# Patient Record
Sex: Female | Born: 1945
Health system: Southern US, Community
[De-identification: ages and names within clinical notes are randomized; demographics above are authoritative.]

## PROBLEM LIST (undated history)

## (undated) DIAGNOSIS — M1990 Unspecified osteoarthritis, unspecified site: Secondary | ICD-10-CM

## (undated) DIAGNOSIS — I10 Essential (primary) hypertension: Secondary | ICD-10-CM

## (undated) DIAGNOSIS — M5430 Sciatica, unspecified side: Secondary | ICD-10-CM

## (undated) DIAGNOSIS — M109 Gout, unspecified: Secondary | ICD-10-CM

## (undated) DIAGNOSIS — K219 Gastro-esophageal reflux disease without esophagitis: Secondary | ICD-10-CM

## (undated) DIAGNOSIS — E785 Hyperlipidemia, unspecified: Secondary | ICD-10-CM

## (undated) DIAGNOSIS — N189 Chronic kidney disease, unspecified: Secondary | ICD-10-CM

## (undated) DIAGNOSIS — E559 Vitamin D deficiency, unspecified: Secondary | ICD-10-CM

## (undated) DIAGNOSIS — D649 Anemia, unspecified: Secondary | ICD-10-CM

## (undated) DIAGNOSIS — E119 Type 2 diabetes mellitus without complications: Secondary | ICD-10-CM

## (undated) HISTORY — DX: Vitamin D deficiency, unspecified: E55.9

## (undated) HISTORY — DX: Hyperlipidemia, unspecified: E78.5

## (undated) HISTORY — DX: Sciatica, unspecified side: M54.30

## (undated) HISTORY — DX: Unspecified osteoarthritis, unspecified site: M19.90

## (undated) HISTORY — PX: COLONOSCOPY: SHX174

## (undated) HISTORY — DX: Essential (primary) hypertension: I10

## (undated) HISTORY — PX: KNEE ARTHROSCOPY: SUR90

## (undated) HISTORY — DX: Gout, unspecified: M10.9

## (undated) HISTORY — DX: Type 2 diabetes mellitus without complications: E11.9

## (undated) HISTORY — DX: Chronic kidney disease, unspecified: N18.9

## (undated) HISTORY — DX: Anemia, unspecified: D64.9

---
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
## 1997-05-18 HISTORY — PX: CHOLECYSTECTOMY: SHX55

## 2012-05-18 DIAGNOSIS — E559 Vitamin D deficiency, unspecified: Secondary | ICD-10-CM

## 2012-05-18 HISTORY — DX: Vitamin D deficiency, unspecified: E55.9

## 2015-05-19 DIAGNOSIS — M109 Gout, unspecified: Secondary | ICD-10-CM

## 2015-05-19 HISTORY — PX: DILATION AND CURETTAGE OF UTERUS: SHX78

## 2015-05-19 HISTORY — DX: Gout, unspecified: M10.9

## 2015-05-19 HISTORY — PX: CATARACT EXTRACTION, BILATERAL: SHX1313

## 2016-06-15 ENCOUNTER — Ambulatory Visit (INDEPENDENT_AMBULATORY_CARE_PROVIDER_SITE_OTHER): Payer: Medicare HMO | Admitting: Family Medicine

## 2016-06-15 VITALS — BP 130/90 | HR 90 | Temp 98.2°F | Ht 62.32 in | Wt 270.4 lb

## 2016-06-15 DIAGNOSIS — I152 Hypertension secondary to endocrine disorders: Secondary | ICD-10-CM | POA: Insufficient documentation

## 2016-06-15 DIAGNOSIS — E785 Hyperlipidemia, unspecified: Secondary | ICD-10-CM | POA: Insufficient documentation

## 2016-06-15 DIAGNOSIS — E1140 Type 2 diabetes mellitus with diabetic neuropathy, unspecified: Secondary | ICD-10-CM

## 2016-06-15 DIAGNOSIS — M549 Dorsalgia, unspecified: Secondary | ICD-10-CM | POA: Insufficient documentation

## 2016-06-15 DIAGNOSIS — M1990 Unspecified osteoarthritis, unspecified site: Secondary | ICD-10-CM | POA: Insufficient documentation

## 2016-06-15 DIAGNOSIS — M5441 Lumbago with sciatica, right side: Secondary | ICD-10-CM

## 2016-06-15 DIAGNOSIS — M109 Gout, unspecified: Secondary | ICD-10-CM | POA: Insufficient documentation

## 2016-06-15 DIAGNOSIS — E559 Vitamin D deficiency, unspecified: Secondary | ICD-10-CM | POA: Insufficient documentation

## 2016-06-15 DIAGNOSIS — I10 Essential (primary) hypertension: Secondary | ICD-10-CM

## 2016-06-15 DIAGNOSIS — D649 Anemia, unspecified: Secondary | ICD-10-CM | POA: Insufficient documentation

## 2016-06-15 DIAGNOSIS — Z794 Long term (current) use of insulin: Secondary | ICD-10-CM

## 2016-06-15 DIAGNOSIS — G8929 Other chronic pain: Secondary | ICD-10-CM

## 2016-06-15 DIAGNOSIS — M545 Low back pain: Secondary | ICD-10-CM | POA: Insufficient documentation

## 2016-06-15 LAB — POCT GLYCOSYLATED HEMOGLOBIN (HGB A1C): Hemoglobin A1C: 8.8

## 2016-06-15 MED ORDER — GABAPENTIN 300 MG PO CAPS: 300.0000 mg | ORAL_CAPSULE | Freq: Two times a day (BID) | ORAL | 3 refills | Status: DC

## 2016-06-15 NOTE — Progress Notes (Signed)
    Subjective:    Patient ID: Michelle Abbott, female    DOB: 1945/12/04, 71 y.o.   MRN: [id number]   CC: here to establish care.  PMH- HTN, DM, gout, iron def anemia, vit D def, hyperlipidemia, diastolic CHF, OA, neuropathy in hands, low back pain Meds- Allergies- ciprofloxacin (IV burned) Surg Hx- gallbladder removal, cataract surgery, lumpectomy R breast, c-section x2 FH- ovarian/pancreatic cancer in mother, lung cancer in brother, HTN/DM in family [place]- lives alone, moved to [place] in November to be closer to daughter. No alcohol or drug use. Former smoker- 1.5 ppd for 30 years.  DM- last A1C was 9.5 in October/November. She has self decreased her Lantus dose from 110 units/night to 100/night because she was feeling shaky and her blood sguar was 70 one morning. She has been eating healthier since she moved to [place]. She has cut down on breads, pasta, potatoes. She does not eat 3 meals a day. Sometimes 1-2 meals a day. She will eat cereals, chicken, green vegetables, fruits, occasionally hamburgers or hotdogs. She will check her blood sugar in the morning maybe twice a week. Has been 63's. Today was 117. She will only take Glipizide once a day usually.   Review of Systems- see HPI   Objective:  BP 130/90 (BP Location: Left Wrist, Patient Position: Sitting, Cuff Size: Normal)   Pulse 90   Temp 98.2 F (36.8 C) (Oral)   Ht 5' 2.32" (1.583 m)   Wt 270 lb 6.4 oz (122.7 kg)   SpO2 94%   BMI 48.95 kg/m  Vitals and nursing note reviewed  General: pleasant elderly lady, obese, in no acute distress Neck: supple, non-tender, without lymphadenopathy or thryomegaly Cardiac: RRR, clear S1 and S2, no murmurs, rubs, or gallops Respiratory: clear to auscultation bilaterally, no increased work of breathing Abdomen: obese abdomen. soft, nontender, +BS Extremities: no edema or cyanosis. +2 radial pulses bilaterally Back: tender to palpation over right SI joint Skin: warm and dry, no  rashes noted Neuro: alert and oriented, no focal deficits. Strength 5/5 in upper and lower extremities bilaterally   Assessment & Plan:    Type 2 diabetes mellitus with diabetic neuropathy, with long-term current use of insulin (HCC) Patient's history concerning for hypogylcemia especially with large dose of Lantus at night and not eating much throughout day  Continue 100u lantus every night, can decrease by 10 units until fasting sugars are above 100 every morning Instructed to check CBG every morning for next several weeks continue Glipizide once a day as she has been Check A1C today Follow up in 1 month, can further change medications at that time    Essential hypertension BP at goal today  Continue current regimen Recheck 1 month  Low back pain  Has been chronic. Patient told she has osteoarthritis of hip but was given neurontin by last provider that helps a lot with pain at night.  -increase gabapentin to 300 mg BID, can increase further at next visit -patient advised to stretch hip muscles -follow up 1 month    Return in about 4 weeks (around 07/13/2016).   Lucila Maine, DO Family Medicine Resident PGY-1

## 2016-06-15 NOTE — Patient Instructions (Signed)
  Please check your blood sugars every morning. If your CBG is <80 consistently, decrease your Lantus by 10 units like you did before.   Let's increase your Gabapentin to 300mg  twice a day.   We'll check your A1C today.  I'll see you back in 1 month.

## 2016-06-15 NOTE — Assessment & Plan Note (Signed)
BP at goal today  Continue current regimen Recheck 1 month

## 2016-06-15 NOTE — Assessment & Plan Note (Signed)
Patient's history concerning for hypogylcemia especially with large dose of Lantus at night and not eating much throughout day  Continue 100u lantus every night, can decrease by 10 units until fasting sugars are above 100 every morning Instructed to check CBG every morning for next several weeks continue Glipizide once a day as she has been Check A1C today Follow up in 1 month, can further change medications at that time

## 2016-06-15 NOTE — Assessment & Plan Note (Signed)
  Has been chronic. Patient told she has osteoarthritis of hip but was given neurontin by last provider that helps a lot with pain at night.  -increase gabapentin to 300 mg BID, can increase further at next visit -patient advised to stretch hip muscles -follow up 1 month

## 2016-07-15 ENCOUNTER — Ambulatory Visit: Payer: Medicare HMO | Admitting: Obstetrics and Gynecology

## 2016-07-21 ENCOUNTER — Ambulatory Visit (INDEPENDENT_AMBULATORY_CARE_PROVIDER_SITE_OTHER): Payer: Medicare HMO | Admitting: Obstetrics and Gynecology

## 2016-07-21 ENCOUNTER — Encounter: Payer: Self-pay | Admitting: Obstetrics and Gynecology

## 2016-07-21 VITALS — BP 142/76 | HR 71 | Temp 97.3°F | Wt 273.0 lb

## 2016-07-21 DIAGNOSIS — E1140 Type 2 diabetes mellitus with diabetic neuropathy, unspecified: Secondary | ICD-10-CM

## 2016-07-21 DIAGNOSIS — Z794 Long term (current) use of insulin: Secondary | ICD-10-CM

## 2016-07-21 DIAGNOSIS — M545 Low back pain, unspecified: Secondary | ICD-10-CM

## 2016-07-21 DIAGNOSIS — G8929 Other chronic pain: Secondary | ICD-10-CM

## 2016-07-21 DIAGNOSIS — I10 Essential (primary) hypertension: Secondary | ICD-10-CM | POA: Diagnosis not present

## 2016-07-21 DIAGNOSIS — N183 Chronic kidney disease, stage 3 (moderate): Secondary | ICD-10-CM

## 2016-07-21 DIAGNOSIS — N185 Chronic kidney disease, stage 5: Secondary | ICD-10-CM | POA: Insufficient documentation

## 2016-07-21 DIAGNOSIS — N184 Chronic kidney disease, stage 4 (severe): Secondary | ICD-10-CM | POA: Insufficient documentation

## 2016-07-21 LAB — COMPLETE METABOLIC PANEL WITH GFR
ALBUMIN: 3.5 g/dL — AB (ref 3.6–5.1)
ALK PHOS: 80 U/L (ref 33–130)
ALT: 8 U/L (ref 6–29)
AST: 12 U/L (ref 10–35)
BILIRUBIN TOTAL: 0.3 mg/dL (ref 0.2–1.2)
BUN: 24 mg/dL (ref 7–25)
CO2: 30 mmol/L (ref 20–31)
CREATININE: 1.62 mg/dL — AB (ref 0.50–0.99)
Calcium: 9.4 mg/dL (ref 8.6–10.4)
Chloride: 106 mmol/L (ref 98–110)
GFR, EST AFRICAN AMERICAN: 37 mL/min — AB (ref >=60)
GFR, Est Non African American: 32 mL/min — ABNORMAL LOW (ref >=60)
Glucose, Bld: 97 mg/dL (ref 65–99)
Potassium: 4 mmol/L (ref 3.5–5.3)
Sodium: 142 mmol/L (ref 135–146)
TOTAL PROTEIN: 6.4 g/dL (ref 6.1–8.1)

## 2016-07-21 MED ORDER — BACLOFEN 10 MG PO TABS: 10.0000 mg | ORAL_TABLET | Freq: Three times a day (TID) | ORAL | 0 refills | Status: DC

## 2016-07-21 MED ORDER — DICLOFENAC SODIUM 50 MG PO TBEC: 50.0000 mg | DELAYED_RELEASE_TABLET | Freq: Two times a day (BID) | ORAL | 0 refills | Status: AC

## 2016-07-21 NOTE — Assessment & Plan Note (Signed)
Has known chronic back pain. Continues to endorse pain. Takes gabapentin which helps some. Was unable to increase to BID because of side effects. Rx given for short course of diclofenac (7 days) and baclofen to help with back pain. Back exercises also given. In furture may need to consider back imaging and PT. Follow-up with PCP.

## 2016-07-21 NOTE — Progress Notes (Signed)
     Subjective: Chief Complaint  Patient presents with  . Diabetes  . Back Pain     HPI: Michelle Abbott is a 71 y.o. presenting to clinic today to discuss the following:  #Diabetes:  Last A1c 8.8 05/2016 Her for follow-up on DM per PCP Sugars - High at home: 143    Low at home: 46 Taking medications: Lantus 100U and glipizide sporadically  Side effects: No reported hypoglycemia since last month On Aspirin On statin ROS: denies fever, chills, dizziness, LOC, polyuria, polydipsia, numbness or tingling in extremities or chest pain, visual disturbances.  #Back Pain Has known sciatica and DJD of spine  Achy pain that is sharp and stabbing on right side Denies radiation symptoms   Has tried aleve without relief Unable to walk long distances due to pain  #HTN Compliant with medications  ROS noted in HPI.   Past Medical, Surgical, Social, and Family History Reviewed & Updated per EMR.   Pertinent Historical Findings include:   History  Smoking Status  . Former Smoker  Smokeless Tobacco  . Never Used    Objective: BP (!) 152/76   Pulse 71   Temp 97.3 F (36.3 C) (Oral)   Wt 273 lb (123.8 kg)   SpO2 99%   BMI 49.42 kg/m  Vitals and nursing notes reviewed  Physical Exam  Constitutional: She is oriented to person, place, and time and well-developed, well-nourished, and in no distress.  Obese   Cardiovascular: Normal rate.   Pulmonary/Chest: Effort normal.  Musculoskeletal: Normal range of motion.  Neurological: She is alert and oriented to person, place, and time. No cranial nerve deficit. Gait normal.   Back Exam:  Inspection: Unremarkable  ROM normal but does fill pulling SLR laying: Negative  XSLR laying: Negative  Palpable tenderness: Yes to piriformis muscle Sensory change: Gross sensation intact to all lumbar and sacral dermatomes.  Strength normal  Gait unremarkable.   No results found for this or any previous visit (from the past 72  hour(s)).  Assessment/Plan: Please see problem based Assessment and Plan PATIENT EDUCATION PROVIDED: See AVS    Diagnosis and plan along with any newly prescribed medication(s) were discussed in detail with this patient today. The patient verbalized understanding and agreed with the plan. Patient advised if symptoms worsen return to clinic.    Orders Placed This Encounter  Procedures  . COMPLETE METABOLIC PANEL WITH GFR    Meds ordered this encounter  Medications  . diclofenac (VOLTAREN) 50 MG EC tablet    Sig: Take 1 tablet (50 mg total) by mouth 2 (two) times daily.    Dispense:  14 tablet    Refill:  0  . baclofen (LIORESAL) 10 MG tablet    Sig: Take 1 tablet (10 mg total) by mouth 3 (three) times daily.    Dispense:  30 each    Refill:  0  . glipiZIDE (GLUCOTROL) 10 MG tablet    Sig: Take 10 mg by mouth 2 (two) times daily before a meal.  . rosuvastatin (CRESTOR) 40 MG tablet    Sig: Take 40 mg by mouth daily.     Luiz Blare, DO 07/21/2016, 11:09 AM PGY-3, [facility]

## 2016-07-21 NOTE — Assessment & Plan Note (Signed)
CBGs better controlled. No more reported episodes of hypoglycemia. Patient to continue current regimen. Encouraged to take all medications as prescribed. Unable to take metformin due to renal disease per patient. Will obtain labs to verify since she is new patient with no previous labs in our system. Follow-up in 2 months with PCP for A1c recheck.

## 2016-07-21 NOTE — Patient Instructions (Addendum)
Continue Lantus 100U and glipizide. No adjustments today For back pain use muscle relaxant. Can also use antiinflammatory medicine for 7 days Checking kidney function   Follow-up with PCP in 2 months   Back Exercises If you have pain in your back, do these exercises 2-3 times each day or as told by your doctor. When the pain goes away, do the exercises once each day, but repeat the steps more times for each exercise (do more repetitions). If you do not have pain in your back, do these exercises once each day or as told by your doctor. Exercises Single Knee to Chest   Do these steps 3-5 times in a row for each leg: 1. Lie on your back on a firm bed or the floor with your legs stretched out. 2. Bring one knee to your chest. 3. Hold your knee to your chest by grabbing your knee or thigh. 4. Pull on your knee until you feel a gentle stretch in your lower back. 5. Keep doing the stretch for 10-30 seconds. 6. Slowly let go of your leg and straighten it. Pelvic Tilt   Do these steps 5-10 times in a row: 1. Lie on your back on a firm bed or the floor with your legs stretched out. 2. Bend your knees so they point up to the ceiling. Your feet should be flat on the floor. 3. Tighten your lower belly (abdomen) muscles to press your lower back against the floor. This will make your tailbone point up to the ceiling instead of pointing down to your feet or the floor. 4. Stay in this position for 5-10 seconds while you gently tighten your muscles and breathe evenly. Cat-Cow   Do these steps until your lower back bends more easily: 1. Get on your hands and knees on a firm surface. Keep your hands under your shoulders, and keep your knees under your hips. You may put padding under your knees. 2. Let your head hang down, and make your tailbone point down to the floor so your lower back is round like the back of a cat. 3. Stay in this position for 5 seconds. 4. Slowly lift your head and make your  tailbone point up to the ceiling so your back hangs low (sags) like the back of a cow. 5. Stay in this position for 5 seconds. Press-Ups   Do these steps 5-10 times in a row: 1. Lie on your belly (face-down) on the floor. 2. Place your hands near your head, about shoulder-width apart. 3. While you keep your back relaxed and keep your hips on the floor, slowly straighten your arms to raise the top half of your body and lift your shoulders. Do not use your back muscles. To make yourself more comfortable, you may change where you place your hands. 4. Stay in this position for 5 seconds. 5. Slowly return to lying flat on the floor. Bridges   Do these steps 10 times in a row: 1. Lie on your back on a firm surface. 2. Bend your knees so they point up to the ceiling. Your feet should be flat on the floor. 3. Tighten your butt muscles and lift your butt off of the floor until your waist is almost as high as your knees. If you do not feel the muscles working in your butt and the back of your thighs, slide your feet 1-2 inches farther away from your butt. 4. Stay in this position for 3-5 seconds. 5. Slowly lower your  butt to the floor, and let your butt muscles relax. If this exercise is too easy, try doing it with your arms crossed over your chest. Belly Crunches   Do these steps 5-10 times in a row: 1. Lie on your back on a firm bed or the floor with your legs stretched out. 2. Bend your knees so they point up to the ceiling. Your feet should be flat on the floor. 3. Cross your arms over your chest. 4. Tip your chin a little bit toward your chest but do not bend your neck. 5. Tighten your belly muscles and slowly raise your chest just enough to lift your shoulder blades a tiny bit off of the floor. 6. Slowly lower your chest and your head to the floor. Back Lifts  Do these steps 5-10 times in a row: 1. Lie on your belly (face-down) with your arms at your sides, and rest your forehead on the  floor. 2. Tighten the muscles in your legs and your butt. 3. Slowly lift your chest off of the floor while you keep your hips on the floor. Keep the back of your head in line with the curve in your back. Look at the floor while you do this. 4. Stay in this position for 3-5 seconds. 5. Slowly lower your chest and your face to the floor. Contact a doctor if:  Your back pain gets a lot worse when you do an exercise.  Your back pain does not lessen 2 hours after you exercise. If you have any of these problems, stop doing the exercises. Do not do them again unless your doctor says it is okay. Get help right away if:  You have sudden, very bad back pain. If this happens, stop doing the exercises. Do not do them again unless your doctor says it is okay. This information is not intended to replace advice given to you by your health care provider. Make sure you discuss any questions you have with your health care provider. Document Released: 06/06/2010 Document Revised: 10/10/2015 Document Reviewed: 06/28/2014 Elsevier Interactive Patient Education  2017 [employer].

## 2016-07-21 NOTE — Assessment & Plan Note (Signed)
BP initially elevated but on recheck wnl. Continue current regimen. Will obtain blood work today to evaluate kidney function as patient states she has CKD.

## 2016-08-25 ENCOUNTER — Ambulatory Visit (INDEPENDENT_AMBULATORY_CARE_PROVIDER_SITE_OTHER): Payer: Medicare HMO | Admitting: *Deleted

## 2016-08-25 ENCOUNTER — Encounter: Payer: Self-pay | Admitting: *Deleted

## 2016-08-25 VITALS — BP 136/62 | HR 62 | Temp 97.6°F | Ht 63.0 in | Wt 269.8 lb

## 2016-08-25 DIAGNOSIS — Z0000 Encounter for general adult medical examination without abnormal findings: Secondary | ICD-10-CM

## 2016-08-25 NOTE — Progress Notes (Signed)
Subjective:   Michelle Abbott is a 70 y.o. female who presents for an Initial Medicare Annual Wellness Visit.  Cardiac Risk Factors include: advanced age (>11men, >61 women);diabetes mellitus;dyslipidemia;hypertension;obesity (BMI >30kg/m2);sedentary lifestyle;smoking/ tobacco exposure     Objective:    Today's Vitals   08/25/16 1013 08/25/16 1014  BP:  136/62  Pulse:  62  Temp:  97.6 F (36.4 C)  TempSrc:  Oral  SpO2:  99%  Weight: 269 lb 12.8 oz (122.4 kg)   Height: 5\' 3"  (1.6 m)   PainSc:  8    Body mass index is 47.79 kg/m.   Current Medications (verified) Outpatient Encounter Prescriptions as of 08/25/2016  Medication Sig  . allopurinol (ZYLOPRIM) 300 MG tablet Take 300 mg by mouth daily.  . ASPIR-81 81 MG EC tablet Take 81 mg by mouth daily.  . B-D ULTRAFINE III SHORT PEN 31G X 8 MM MISC 270 Units by Does not apply route every morning.  . chlorthalidone (HYGROTON) 25 MG tablet Take 25 mg by mouth daily.  . ferrous sulfate 325 (65 FE) MG tablet Take 325 mg by mouth daily.  Marland Kitchen gabapentin (NEURONTIN) 300 MG capsule Take 1 capsule (300 mg total) by mouth 2 (two) times daily.  Marland Kitchen glipiZIDE (GLUCOTROL) 10 MG tablet Take 10 mg by mouth 2 (two) times daily before a meal.  . LANTUS SOLOSTAR 100 UNIT/ML Solostar Pen Inject 100 Units into the skin at bedtime.  Marland Kitchen lisinopril (PRINIVIL,ZESTRIL) 40 MG tablet Take 40 mg by mouth daily.  . metoprolol succinate (TOPROL-XL) 25 MG 24 hr tablet Take 25 mg by mouth daily.  . rosuvastatin (CRESTOR) 40 MG tablet Take 40 mg by mouth daily.  . Vitamins A & D (VITAMIN A & D) 10000-400 units TABS Take 1,000 Units by mouth daily.  . baclofen (LIORESAL) 10 MG tablet Take 1 tablet (10 mg total) by mouth 3 (three) times daily. (Patient not taking: Reported on 08/25/2016)   No facility-administered encounter medications on file as of 08/25/2016.     Allergies (verified) Ciprofloxacin hcl   History: Past Medical History:  Diagnosis Date  .  Anemia   . Arthritis   . Chronic kidney disease   . Diabetes mellitus without complication ([place])   . Gout 2017  . Hyperlipidemia   . Hypertension   . Vitamin D deficiency 2014   Past Surgical History:  Procedure Laterality Date  . BREAST SURGERY Right 1961   Benign lump removed  . CATARACT EXTRACTION, BILATERAL Bilateral 2017  . CESAREAN SECTION  1985  . CESAREAN SECTION  1989  . CHOLECYSTECTOMY  1999  . DILATION AND CURETTAGE OF UTERUS  2017  . KNEE ARTHROSCOPY Right   . VAGINAL SEPTOPLASTY  1966   Family History  Problem Relation Age of Onset  . Cancer Mother     ovarian and pancreatic  . Diabetes Mother   . Stroke Mother   . COPD Father   . Arthritis Father   . Cancer Brother     Lung  . COPD Brother    Social History   Occupational History  . Not on file.   Social History Main Topics  . Smoking status: Former Smoker    Packs/day: 1.50    Years: 30.00    Types: Cigarettes    Quit date: 05/18/1997  . Smokeless tobacco: Never Used  . Alcohol use No  . Drug use: No  . Sexual activity: No    Tobacco Counseling Counseling given: Yes Patient  is former smoker with no plans to restart   Activities of Daily Living In your present state of health, do you have any difficulty performing the following activities: 08/25/2016  Hearing? N  Vision? Y  Difficulty concentrating or making decisions? N  Walking or climbing stairs? Y  Dressing or bathing? N  Doing errands, shopping? N  Preparing Food and eating ? N  Using the Toilet? N  In the past six months, have you accidently leaked urine? Y  Do you have problems with loss of bowel control? N  Managing your Medications? N  Managing your Finances? N  Housekeeping or managing your Housekeeping? N   Home Safety:  My home has a working smoke alarm:  Yes X 2           My home throw rugs have been fastened down to the floor or removed:  Removed I have non-slip mats in the bathtub and shower:  Yes         All my  home's stairs have railings or bannisters: One level home with 2 outside steps without handrails           My home's floors, stairs and hallways are free from clutter, wires and cords:  Yes     I wear seatbelts consistently:  Yes   Immunizations and Health Maintenance Immunization History  Administered Date(s) Administered  . Influenza-Unspecified 02/21/2016   Health Maintenance Due  Topic Date Due  . Hepatitis C Screening  05-Aug-1945  . FOOT EXAM  01/27/1958  . OPHTHALMOLOGY EXAM  01/27/1958  . TETANUS/TDAP  01/28/1967  . MAMMOGRAM  01/27/1998  . COLONOSCOPY  01/27/1998  . DEXA SCAN  01/27/2013  . PNA vac Low Risk Adult (1 of 2 - PCV13) 01/27/2013  Patient recently moved from [place] and brought medical records (mostly Clinical Summaries) with her. Records give dates of flu and prevnar vaccines and state she previously received pneumovax and Hep B but no dates given.  State last pap was 06/2014 Only blood work is a Chem 8 from 01/17/2014 No Lipid panel or Hep C found.  Will need patient to fill out ROI and send to:  [facility] [address] [place] [address]  Ph [phone] Fax [phone] Patient states last colonoscopy was > 10 years ago contact info given for GI Patient states last eye exam 02/2016  Patient Care Team: Steve Rattler, DO as PCP - General  Indicate any recent Medical Services you may have received from other than [facility] providers in the past year (date may be approximate).     Assessment:   This is a routine wellness examination for [name].   Hearing/Vision screen  Hearing Screening   Method: Audiometry   125Hz  250Hz  500Hz  1000Hz  2000Hz  3000Hz  4000Hz  6000Hz  8000Hz   Right ear:   40 Fail Fail  40    Left ear:   40 40 40  Fail      Dietary issues and exercise activities discussed: Current Exercise Habits: Home exercise routine, Type of exercise: stretching;walking, Time (Minutes): 10, Frequency (Times/Week): 7, Weekly  Exercise (Minutes/Week): 70, Intensity: Mild  Goals    . Blood Pressure < 140/90    . Eat more fruits and vegetables (pt-stated)    . Weight (lb) < 255 lb (115.7 kg) (pt-stated)          5% weight loss      Depression Screen PHQ 2/9 Scores 08/25/2016 07/21/2016 06/15/2016  PHQ - 2 Score 1  0 0    Fall Risk Fall Risk  08/25/2016 07/21/2016 06/15/2016 06/15/2016  Falls in the past year? No No - No  Risk for fall due to : - - (No Data) History of fall(s)  Risk for fall due to (comments): - - entered in error/ no history of falls -   TUG Test:  Done in 10 seconds. Patient used both hands to push out of chair and to sit back down.  Cognitive Function: Mini-Cog  Passed with score 5/5   Screening Tests Health Maintenance  Topic Date Due  . Hepatitis C Screening  08/27/1945  . FOOT EXAM  01/27/1958  . OPHTHALMOLOGY EXAM  01/27/1958  . TETANUS/TDAP  01/28/1967  . MAMMOGRAM  01/27/1998  . COLONOSCOPY  01/27/1998  . DEXA SCAN  01/27/2013  . PNA vac Low Risk Adult (1 of 2 - PCV13) 01/27/2013  . HEMOGLOBIN A1C  12/13/2016  . INFLUENZA VACCINE  12/16/2016  Foot exam done today Diabetic Foot Exam - Simple   Simple Foot Form Diabetic Foot exam was performed with the following findings:  Yes 08/25/2016 10:45 AM  Visual Inspection No deformities, no ulcerations, no other skin breakdown bilaterally:  Yes Sensation Testing Intact to touch and monofilament testing bilaterally:  Yes Pulse Check Posterior Tibialis and Dorsalis pulse intact bilaterally:  Yes Comments All 10 toes have some degree of discoloration (black). Patient states that she has previously been treated for toenail fungus and thinks that is what this is. Encouraged to f/u with PCP for possible treatment. Hubbard Hartshorn, RN, BSN         Plan:   F/u with PCP for toe discoloration and patient c/o hand tremor R > L   Card given for Dr. Jenne Campus to schedule nutrition visit for help with desired 5% weight loss  Will need patient to  fill out ROI and send to:  [facility] [address] [place] [address]  Ph [phone] Fax [phone]   During the course of the visit, Tate was educated and counseled about the following appropriate screening and preventive services:   Vaccines to include Pneumoccal, Influenza, Td, Zostavax/Shingrix  Cardiovascular disease screening  Colorectal cancer screening  Bone density screening  Diabetes screening  Glaucoma screening  Mammography/PAP  Nutrition counseling  Patient Instructions (the written plan) were given to the patient.    Velora Heckler, RN   08/25/2016

## 2016-08-25 NOTE — Patient Instructions (Addendum)
 Diabetes and Foot Care Diabetes may cause you to have problems because of poor blood supply (circulation) to your feet and legs. This may cause the skin on your feet to become thinner, break easier, and heal more slowly. Your skin may become dry, and the skin may peel and crack. You may also have nerve damage in your legs and feet causing decreased feeling in them. You may not notice minor injuries to your feet that could lead to infections or more serious problems. Taking care of your feet is one of the most important things you can do for yourself. Follow these instructions at home:  Wear shoes at all times, even in the house. Do not go barefoot. Bare feet are easily injured.  Check your feet daily for blisters, cuts, and redness. If you cannot see the bottom of your feet, use a mirror or ask someone for help.  Wash your feet with warm water (do not use hot water) and mild soap. Then pat your feet and the areas between your toes until they are completely dry. Do not soak your feet as this can dry your skin.  Apply a moisturizing lotion or petroleum jelly (that does not contain alcohol and is unscented) to the skin on your feet and to dry, brittle toenails. Do not apply lotion between your toes.  Trim your toenails straight across. Do not dig under them or around the cuticle. File the edges of your nails with an emery board or nail file.  Do not cut corns or calluses or try to remove them with medicine.  Wear clean socks or stockings every day. Make sure they are not too tight. Do not wear knee-high stockings since they may decrease blood flow to your legs.  Wear shoes that fit properly and have enough cushioning. To break in new shoes, wear them for just a few hours a day. This prevents you from injuring your feet. Always look in your shoes before you put them on to be sure there are no objects inside.  Do not cross your legs. This may decrease the blood flow to your feet.  If you find a  minor scrape, cut, or break in the skin on your feet, keep it and the skin around it clean and dry. These areas may be cleansed with mild soap and water. Do not cleanse the area with peroxide, alcohol, or iodine.  When you remove an adhesive bandage, be sure not to damage the skin around it.  If you have a wound, look at it several times a day to make sure it is healing.  Do not use heating pads or hot water bottles. They may burn your skin. If you have lost feeling in your feet or legs, you may not know it is happening until it is too late.  Make sure your health care provider performs a complete foot exam at least annually or more often if you have foot problems. Report any cuts, sores, or bruises to your health care provider immediately. Contact a health care provider if:  You have an injury that is not healing.  You have cuts or breaks in the skin.  You have an ingrown nail.  You notice redness on your legs or feet.  You feel burning or tingling in your legs or feet.  You have pain or cramps in your legs and feet.  Your legs or feet are numb.  Your feet always feel cold. Get help right away if:  There is   increasing redness, swelling, or pain in or around a wound.  There is a red line that goes up your leg.  Pus is coming from a wound.  You develop a fever or as directed by your health care provider.  You notice a bad smell coming from an ulcer or wound. This information is not intended to replace advice given to you by your health care provider. Make sure you discuss any questions you have with your health care provider. Document Released: 05/01/2000 Document Revised: 10/10/2015 Document Reviewed: 10/11/2012 Elsevier Interactive Patient Education  2017 [place] Prevention in the Home Falls can cause injuries. They can happen to people of all ages. There are many things you can do to make your home safe and to help prevent falls. What can I do on the  outside of my home?  Regularly fix the edges of walkways and driveways and fix any cracks.  Remove anything that might make you trip as you walk through a door, such as a raised step or threshold.  Trim any bushes or trees on the path to your home.  Use bright outdoor lighting.  Clear any walking paths of anything that might make someone trip, such as rocks or tools.  Regularly check to see if handrails are loose or broken. Make sure that both sides of any steps have handrails.  Any raised decks and porches should have guardrails on the edges.  Have any leaves, snow, or ice cleared regularly.  Use sand or salt on walking paths during winter.  Clean up any spills in your garage right away. This includes oil or grease spills. What can I do in the bathroom?  Use night lights.  Install grab bars by the toilet and in the tub and shower. Do not use towel bars as grab bars.  Use non-skid mats or decals in the tub or shower.  If you need to sit down in the shower, use a plastic, non-slip stool.  Keep the floor dry. Clean up any water that spills on the floor as soon as it happens.  Remove soap buildup in the tub or shower regularly.  Attach bath mats securely with double-sided non-slip rug tape.  Do not have throw rugs and other things on the floor that can make you trip. What can I do in the bedroom?  Use night lights.  Make sure that you have a light by your bed that is easy to reach.  Do not use any sheets or blankets that are too big for your bed. They should not hang down onto the floor.  Have a firm chair that has side arms. You can use this for support while you get dressed.  Do not have throw rugs and other things on the floor that can make you trip. What can I do in the kitchen?  Clean up any spills right away.  Avoid walking on wet floors.  Keep items that you use a lot in easy-to-reach places.  If you need to reach something above you, use a strong step  stool that has a grab bar.  Keep electrical cords out of the way.  Do not use floor polish or wax that makes floors slippery. If you must use wax, use non-skid floor wax.  Do not have throw rugs and other things on the floor that can make you trip. What can I do with my stairs?  Do not leave any items on the stairs.  Make sure that there  are handrails on both sides of the stairs and use them. Fix handrails that are broken or loose. Make sure that handrails are as long as the stairways.  Check any carpeting to make sure that it is firmly attached to the stairs. Fix any carpet that is loose or worn.  Avoid having throw rugs at the top or bottom of the stairs. If you do have throw rugs, attach them to the floor with carpet tape.  Make sure that you have a light switch at the top of the stairs and the bottom of the stairs. If you do not have them, ask someone to add them for you. What else can I do to help prevent falls?  Wear shoes that:  Do not have high heels.  Have rubber bottoms.  Are comfortable and fit you well.  Are closed at the toe. Do not wear sandals.  If you use a stepladder:  Make sure that it is fully opened. Do not climb a closed stepladder.  Make sure that both sides of the stepladder are locked into place.  Ask someone to hold it for you, if possible.  Clearly mark and make sure that you can see:  Any grab bars or handrails.  First and last steps.  Where the edge of each step is.  Use tools that help you move around (mobility aids) if they are needed. These include:  Canes.  Walkers.  Scooters.  Crutches.  Turn on the lights when you go into a dark area. Replace any light bulbs as soon as they burn out.  Set up your furniture so you have a clear path. Avoid moving your furniture around.  If any of your floors are uneven, fix them.  If there are any pets around you, be aware of where they are.  Review your medicines with your doctor. Some  medicines can make you feel dizzy. This can increase your chance of falling. Ask your doctor what other things that you can do to help prevent falls. This information is not intended to replace advice given to you by your health care provider. Make sure you discuss any questions you have with your health care provider. Document Released: 02/28/2009 Document Revised: 10/10/2015 Document Reviewed: 06/08/2014 Elsevier Interactive Patient Education  2017 [employer]  Health Maintenance, Female Adopting a healthy lifestyle and getting preventive care can go a long way to promote health and wellness. Talk with your health care provider about what schedule of regular examinations is right for you. This is a good chance for you to check in with your provider about disease prevention and staying healthy. In between checkups, there are plenty of things you can do on your own. Experts have done a lot of research about which lifestyle changes and preventive measures are most likely to keep you healthy. Ask your health care provider for more information. Weight and diet Eat a healthy diet  Be sure to include plenty of vegetables, fruits, low-fat dairy products, and lean protein.  Do not eat a lot of foods high in solid fats, added sugars, or salt.  Get regular exercise. This is one of the most important things you can do for your health.  Most adults should exercise for at least 150 minutes each week. The exercise should increase your heart rate and make you sweat (moderate-intensity exercise).  Most adults should also do strengthening exercises at least twice a week. This is in addition to the moderate-intensity exercise. Maintain a healthy weight  Body   mass index (BMI) is a measurement that can be used to identify possible weight problems. It estimates body fat based on height and weight. Your health care provider can help determine your BMI and help you achieve or maintain a healthy weight.  For  females 20 years of age and older:  A BMI below 18.5 is considered underweight.  A BMI of 18.5 to 24.9 is normal.  A BMI of 25 to 29.9 is considered overweight.  A BMI of 30 and above is considered obese. Watch levels of cholesterol and blood lipids  You should start having your blood tested for lipids and cholesterol at 69 years of age, then have this test every 5 years.  You may need to have your cholesterol levels checked more often if:  Your lipid or cholesterol levels are high.  You are older than 69 years of age.  You are at high risk for heart disease. Cancer screening Lung Cancer  Lung cancer screening is recommended for adults 55-80 years old who are at high risk for lung cancer because of a history of smoking.  A yearly low-dose CT scan of the lungs is recommended for people who:  Currently smoke.  Have quit within the past 15 years.  Have at least a 30-pack-year history of smoking. A pack year is smoking an average of one pack of cigarettes a day for 1 year.  Yearly screening should continue until it has been 15 years since you quit.  Yearly screening should stop if you develop a health problem that would prevent you from having lung cancer treatment. Breast Cancer  Practice breast self-awareness. This means understanding how your breasts normally appear and feel.  It also means doing regular breast self-exams. Let your health care provider know about any changes, no matter how small.  If you are in your 20s or 30s, you should have a clinical breast exam (CBE) by a health care provider every 1-3 years as part of a regular health exam.  If you are 40 or older, have a CBE every year. Also consider having a breast X-ray (mammogram) every year.  If you have a family history of breast cancer, talk to your health care provider about genetic screening.  If you are at high risk for breast cancer, talk to your health care provider about having an MRI and a mammogram  every year.  Breast cancer gene (BRCA) assessment is recommended for women who have family members with BRCA-related cancers. BRCA-related cancers include:  Breast.  Ovarian.  Tubal.  Peritoneal cancers.  Results of the assessment will determine the need for genetic counseling and BRCA1 and BRCA2 testing. Cervical Cancer  Your health care provider may recommend that you be screened regularly for cancer of the pelvic organs (ovaries, uterus, and vagina). This screening involves a pelvic examination, including checking for microscopic changes to the surface of your cervix (Pap test). You may be encouraged to have this screening done every 3 years, beginning at age 21.  For women ages 30-65, health care providers may recommend pelvic exams and Pap testing every 3 years, or they may recommend the Pap and pelvic exam, combined with testing for human papilloma virus (HPV), every 5 years. Some types of HPV increase your risk of cervical cancer. Testing for HPV may also be done on women of any age with unclear Pap test results.  Other health care providers may not recommend any screening for nonpregnant women who are considered low risk for pelvic cancer and   who do not have symptoms. Ask your health care provider if a screening pelvic exam is right for you.  If you have had past treatment for cervical cancer or a condition that could lead to cancer, you need Pap tests and screening for cancer for at least 20 years after your treatment. If Pap tests have been discontinued, your risk factors (such as having a new sexual partner) need to be reassessed to determine if screening should resume. Some women have medical problems that increase the chance of getting cervical cancer. In these cases, your health care provider may recommend more frequent screening and Pap tests. Colorectal Cancer  This type of cancer can be detected and often prevented.  Routine colorectal cancer screening usually begins at 69  years of age and continues through 69 years of age.  Your health care provider may recommend screening at an earlier age if you have risk factors for colon cancer.  Your health care provider may also recommend using home test kits to check for hidden blood in the stool.  A small camera at the end of a tube can be used to examine your colon directly (sigmoidoscopy or colonoscopy). This is done to check for the earliest forms of colorectal cancer.  Routine screening usually begins at age 50.  Direct examination of the colon should be repeated every 5-10 years through 69 years of age. However, you may need to be screened more often if early forms of precancerous polyps or small growths are found. Skin Cancer  Check your skin from head to toe regularly.  Tell your health care provider about any new moles or changes in moles, especially if there is a change in a mole's shape or color.  Also tell your health care provider if you have a mole that is larger than the size of a pencil eraser.  Always use sunscreen. Apply sunscreen liberally and repeatedly throughout the day.  Protect yourself by wearing long sleeves, pants, a wide-brimmed hat, and sunglasses whenever you are outside. Heart disease, diabetes, and high blood pressure  High blood pressure causes heart disease and increases the risk of stroke. High blood pressure is more likely to develop in:  People who have blood pressure in the high end of the normal range (130-139/85-89 mm Hg).  People who are overweight or obese.  People who are African American.  If you are 18-39 years of age, have your blood pressure checked every 3-5 years. If you are 40 years of age or older, have your blood pressure checked every year. You should have your blood pressure measured twice-once when you are at a hospital or clinic, and once when you are not at a hospital or clinic. Record the average of the two measurements. To check your blood pressure when  you are not at a hospital or clinic, you can use:  An automated blood pressure machine at a pharmacy.  A home blood pressure monitor.  If you are between 55 years and 79 years old, ask your health care provider if you should take aspirin to prevent strokes.  Have regular diabetes screenings. This involves taking a blood sample to check your fasting blood sugar level.  If you are at a normal weight and have a low risk for diabetes, have this test once every three years after 69 years of age.  If you are overweight and have a high risk for diabetes, consider being tested at a younger age or more often. Preventing infection Hepatitis B    If you have a higher risk for hepatitis B, you should be screened for this virus. You are considered at high risk for hepatitis B if:  You were born in a country where hepatitis B is common. Ask your health care provider which countries are considered high risk.  Your parents were born in a high-risk country, and you have not been immunized against hepatitis B (hepatitis B vaccine).  You have HIV or AIDS.  You use needles to inject street drugs.  You live with someone who has hepatitis B.  You have had sex with someone who has hepatitis B.  You get hemodialysis treatment.  You take certain medicines for conditions, including cancer, organ transplantation, and autoimmune conditions. Hepatitis C  Blood testing is recommended for:  Everyone born from [birth year] through 1963.  Anyone with known risk factors for hepatitis C. Sexually transmitted infections (STIs)  You should be screened for sexually transmitted infections (STIs) including gonorrhea and chlamydia if:  You are sexually active and are younger than 69 years of age.  You are older than 69 years of age and your health care provider tells you that you are at risk for this type of infection.  Your sexual activity has changed since you were last screened and you are at an increased risk for  chlamydia or gonorrhea. Ask your health care provider if you are at risk.  If you do not have HIV, but are at risk, it may be recommended that you take a prescription medicine daily to prevent HIV infection. This is called pre-exposure prophylaxis (PrEP). You are considered at risk if:  You are sexually active and do not regularly use condoms or know the HIV status of your partner(s).  You take drugs by injection.  You are sexually active with a partner who has HIV. Talk with your health care provider about whether you are at high risk of being infected with HIV. If you choose to begin PrEP, you should first be tested for HIV. You should then be tested every 3 months for as long as you are taking PrEP. Pregnancy  If you are premenopausal and you may become pregnant, ask your health care provider about preconception counseling.  If you may become pregnant, take 400 to 800 micrograms (mcg) of folic acid every day.  If you want to prevent pregnancy, talk to your health care provider about birth control (contraception). Osteoporosis and menopause  Osteoporosis is a disease in which the bones lose minerals and strength with aging. This can result in serious bone fractures. Your risk for osteoporosis can be identified using a bone density scan.  If you are 68 years of age or older, or if you are at risk for osteoporosis and fractures, ask your health care provider if you should be screened.  Ask your health care provider whether you should take a calcium or vitamin D supplement to lower your risk for osteoporosis.  Menopause may have certain physical symptoms and risks.  Hormone replacement therapy may reduce some of these symptoms and risks. Talk to your health care provider about whether hormone replacement therapy is right for you. Follow these instructions at home:  Schedule regular health, dental, and eye exams.  Stay current with your immunizations.  Do not use any tobacco products  including cigarettes, chewing tobacco, or electronic cigarettes.  If you are pregnant, do not drink alcohol.  If you are breastfeeding, limit how much and how often you drink alcohol.  Limit alcohol intake to no  more than 1 drink per day for nonpregnant women. One drink equals 12 ounces of beer, 5 ounces of wine, or 1 ounces of hard liquor.  Do not use street drugs.  Do not share needles.  Ask your health care provider for help if you need support or information about quitting drugs.  Tell your health care provider if you often feel depressed.  Tell your health care provider if you have ever been abused or do not feel safe at home. This information is not intended to replace advice given to you by your health care provider. Make sure you discuss any questions you have with your health care provider. Document Released: 11/17/2010 Document Revised: 10/10/2015 Document Reviewed: 02/05/2015 Elsevier Interactive Patient Education  2017 [employer].

## 2016-08-26 NOTE — Progress Notes (Signed)
I have reviewed this visit and discussed with Howell Rucks, RN  Lucila Maine, DO PGY-1, [facility] 08/26/2016 4:54 PM     Subjective:   Michelle Abbott is a 71 y.o. female who presents for an Initial Medicare Annual Wellness Visit.  Cardiac Risk Factors include: advanced age (>54men, >57 women);diabetes mellitus;dyslipidemia;hypertension;obesity (BMI >30kg/m2);sedentary lifestyle;smoking/ tobacco exposure     Objective:    Today's Vitals   08/25/16 1013 08/25/16 1014  BP:  136/62  Pulse:  62  Temp:  97.6 F (36.4 C)  TempSrc:  Oral  SpO2:  99%  Weight: 269 lb 12.8 oz (122.4 kg)   Height: 5\' 3"  (1.6 m)   PainSc:  8    Body mass index is 47.79 kg/m.   Current Medications (verified) Outpatient Encounter Prescriptions as of 08/25/2016  Medication Sig  . allopurinol (ZYLOPRIM) 300 MG tablet Take 300 mg by mouth daily.  . ASPIR-81 81 MG EC tablet Take 81 mg by mouth daily.  . B-D ULTRAFINE III SHORT PEN 31G X 8 MM MISC 270 Units by Does not apply route every morning.  . chlorthalidone (HYGROTON) 25 MG tablet Take 25 mg by mouth daily.  . ferrous sulfate 325 (65 FE) MG tablet Take 325 mg by mouth daily.  Marland Kitchen gabapentin (NEURONTIN) 300 MG capsule Take 1 capsule (300 mg total) by mouth 2 (two) times daily.  Marland Kitchen glipiZIDE (GLUCOTROL) 10 MG tablet Take 10 mg by mouth 2 (two) times daily before a meal.  . LANTUS SOLOSTAR 100 UNIT/ML Solostar Pen Inject 100 Units into the skin at bedtime.  Marland Kitchen lisinopril (PRINIVIL,ZESTRIL) 40 MG tablet Take 40 mg by mouth daily.  . metoprolol succinate (TOPROL-XL) 25 MG 24 hr tablet Take 25 mg by mouth daily.  . rosuvastatin (CRESTOR) 40 MG tablet Take 40 mg by mouth daily.  . Vitamins A & D (VITAMIN A & D) 10000-400 units TABS Take 1,000 Units by mouth daily.  . baclofen (LIORESAL) 10 MG tablet Take 1 tablet (10 mg total) by mouth 3 (three) times daily. (Patient not taking: Reported on 08/25/2016)   No facility-administered encounter  medications on file as of 08/25/2016.     Allergies (verified) Ciprofloxacin hcl   History: Past Medical History:  Diagnosis Date  . Anemia   . Arthritis   . Chronic kidney disease   . Diabetes mellitus without complication ([place])   . Gout 2017  . Hyperlipidemia   . Hypertension   . Sciatica   . Vitamin D deficiency 2014   Past Surgical History:  Procedure Laterality Date  . BREAST SURGERY Right 1961   Benign lump removed  . CATARACT EXTRACTION, BILATERAL Bilateral 2017  . CESAREAN SECTION  1985  . CESAREAN SECTION  1989  . CHOLECYSTECTOMY  1999  . DILATION AND CURETTAGE OF UTERUS  2017  . KNEE ARTHROSCOPY Right   . VAGINAL SEPTOPLASTY  1966   Family History  Problem Relation Age of Onset  . Cancer Mother     ovarian and pancreatic  . Diabetes Mother   . Stroke Mother   . COPD Father   . Arthritis Father   . Cancer Brother     Lung  . COPD Brother    Social History   Occupational History  . Not on file.   Social History Main Topics  . Smoking status: Former Smoker    Packs/day: 1.50    Years: 30.00    Types: Cigarettes    Quit date: 05/18/1997  .  Smokeless tobacco: Never Used  . Alcohol use No  . Drug use: No  . Sexual activity: No    Tobacco Counseling Counseling given: Yes Patient is former smoker with no plans to restart   Activities of Daily Living In your present state of health, do you have any difficulty performing the following activities: 08/25/2016  Hearing? N  Vision? Y  Difficulty concentrating or making decisions? N  Walking or climbing stairs? Y  Dressing or bathing? N  Doing errands, shopping? N  Preparing Food and eating ? N  Using the Toilet? N  In the past six months, have you accidently leaked urine? Y  Do you have problems with loss of bowel control? N  Managing your Medications? N  Managing your Finances? N  Housekeeping or managing your Housekeeping? N   Home Safety:  My home has a working smoke alarm:  Yes X 2            My home throw rugs have been fastened down to the floor or removed:  Removed I have non-slip mats in the bathtub and shower:  Yes         All my home's stairs have railings or bannisters: One level home with 2 outside steps without handrails           My home's floors, stairs and hallways are free from clutter, wires and cords:  Yes     I wear seatbelts consistently:  Yes   Immunizations and Health Maintenance Immunization History  Administered Date(s) Administered  . Influenza-Unspecified 03/10/2016  . Pneumococcal Conjugate-13 03/10/2016   Health Maintenance Due  Topic Date Due  . Hepatitis C Screening  [birth year]  . OPHTHALMOLOGY EXAM  01/27/1958  . TETANUS/TDAP  01/28/1967  . MAMMOGRAM  01/27/1998  . COLONOSCOPY  01/27/1998  . DEXA SCAN  01/27/2013  Patient recently moved from [place] and brought medical records (mostly Clinical Summaries) with her. Records give dates of flu and prevnar vaccines and state she previously received pneumovax and Hep B but no dates given.  State last pap was 06/2014 Only blood work is a Chem 8 from 01/17/2014 No Lipid panel or Hep C found.  Will need patient to fill out ROI and send to:  [facility] [address] [place] [address]  Ph [phone] Fax [phone] Patient states last colonoscopy was > 10 years ago contact info given for GI Patient states last eye exam 02/2016  Patient Care Team: Steve Rattler, DO as PCP - General  Indicate any recent Medical Services you may have received from other than [facility] providers in the past year (date may be approximate).     Assessment:   This is a routine wellness examination for Michelle Abbott.   Hearing/Vision screen  Hearing Screening   Method: Audiometry   125Hz  250Hz  500Hz  1000Hz  2000Hz  3000Hz  4000Hz  6000Hz  8000Hz   Right ear:   40 Fail Fail  40    Left ear:   40 40 40  Fail      Dietary issues and exercise activities discussed: Current Exercise Habits: Home  exercise routine, Type of exercise: stretching;walking, Time (Minutes): 10, Frequency (Times/Week): 7, Weekly Exercise (Minutes/Week): 70, Intensity: Mild  Goals    . Blood Pressure < 140/90    . Eat more fruits and vegetables (pt-stated)    . Weight (lb) < 255 lb (115.7 kg) (pt-stated)          5% weight loss  Depression Screen PHQ 2/9 Scores 08/25/2016 07/21/2016 06/15/2016  PHQ - 2 Score 1 0 0    Fall Risk Fall Risk  08/25/2016 07/21/2016 06/15/2016 06/15/2016  Falls in the past year? No No - No  Risk for fall due to : - - (No Data) History of fall(s)  Risk for fall due to (comments): - - entered in error/ no history of falls -   TUG Test:  Done in 10 seconds. Patient used both hands to push out of chair and to sit back down.  Cognitive Function: Mini-Cog  Passed with score 5/5   Screening Tests Health Maintenance  Topic Date Due  . Hepatitis C Screening  1945/11/01  . OPHTHALMOLOGY EXAM  01/27/1958  . TETANUS/TDAP  01/28/1967  . MAMMOGRAM  01/27/1998  . COLONOSCOPY  01/27/1998  . DEXA SCAN  01/27/2013  . HEMOGLOBIN A1C  12/13/2016  . INFLUENZA VACCINE  12/16/2016  . PNA vac Low Risk Adult (2 of 2 - PPSV23) 03/10/2017  . FOOT EXAM  08/25/2017  Foot exam done today Diabetic Foot Exam - Simple   Simple Foot Form Diabetic Foot exam was performed with the following findings:  Yes 08/25/2016 10:45 AM  Visual Inspection No deformities, no ulcerations, no other skin breakdown bilaterally:  Yes Sensation Testing Intact to touch and monofilament testing bilaterally:  Yes Pulse Check Posterior Tibialis and Dorsalis pulse intact bilaterally:  Yes Comments All 10 toes have some degree of discoloration (black). Patient states that she has previously been treated for toenail fungus and thinks that is what this is. Encouraged to f/u with PCP for possible treatment. Michelle Hartshorn, RN, BSN         Plan:   F/u with PCP for toe discoloration and patient c/o hand tremor R > L    Card given for Dr. Jenne Campus to schedule nutrition visit for help with desired 5% weight loss  Will need patient to fill out ROI and send to:  [facility] [address] [place] [address]  Ph [phone] Fax [phone]   During the course of the visit, Angenette was educated and counseled about the following appropriate screening and preventive services:   Vaccines to include Pneumoccal, Influenza, Td, Zostavax/Shingrix  Cardiovascular disease screening  Colorectal cancer screening  Bone density screening  Diabetes screening  Glaucoma screening  Mammography/PAP  Nutrition counseling  Patient Instructions (the written plan) were given to the patient.    Steve Rattler, DO   08/26/2016

## 2016-09-21 ENCOUNTER — Encounter: Payer: Self-pay | Admitting: Family Medicine

## 2016-09-21 ENCOUNTER — Ambulatory Visit (INDEPENDENT_AMBULATORY_CARE_PROVIDER_SITE_OTHER): Payer: Medicare HMO | Admitting: Family Medicine

## 2016-09-21 VITALS — BP 130/64 | HR 72 | Temp 97.8°F | Ht 63.0 in | Wt 268.4 lb

## 2016-09-21 DIAGNOSIS — G8929 Other chronic pain: Secondary | ICD-10-CM

## 2016-09-21 DIAGNOSIS — Z794 Long term (current) use of insulin: Secondary | ICD-10-CM | POA: Diagnosis not present

## 2016-09-21 DIAGNOSIS — G63 Polyneuropathy in diseases classified elsewhere: Secondary | ICD-10-CM

## 2016-09-21 DIAGNOSIS — E1140 Type 2 diabetes mellitus with diabetic neuropathy, unspecified: Secondary | ICD-10-CM | POA: Diagnosis not present

## 2016-09-21 DIAGNOSIS — M545 Low back pain, unspecified: Secondary | ICD-10-CM

## 2016-09-21 DIAGNOSIS — G629 Polyneuropathy, unspecified: Secondary | ICD-10-CM | POA: Insufficient documentation

## 2016-09-21 DIAGNOSIS — I10 Essential (primary) hypertension: Secondary | ICD-10-CM

## 2016-09-21 LAB — POCT GLYCOSYLATED HEMOGLOBIN (HGB A1C): Hemoglobin A1C: 9.1

## 2016-09-21 MED ORDER — TRAMADOL HCL 50 MG PO TABS: 50.0000 mg | ORAL_TABLET | Freq: Three times a day (TID) | ORAL | 0 refills | Status: DC | PRN

## 2016-09-21 MED ORDER — DICLOFENAC SODIUM 1 % TD GEL: 2.0000 g | Freq: Four times a day (QID) | TRANSDERMAL | 5 refills | Status: DC

## 2016-09-21 NOTE — Assessment & Plan Note (Signed)
  Chronic, no improvement with Aleve, stretches. Interfering with daily activities.  -encouraged continued exercise and weight loss -rx given for voltaren gel to use topically -advised patient to avoid oral NSAIDs -recommended scheduled tylenol -rx given for 50 mg tramadol to take as needed, 15 pills dispensed -patient declined referral to PT -follow up 1 month

## 2016-09-21 NOTE — Progress Notes (Signed)
Subjective:    Patient ID: Michelle Abbott, female    DOB: 05-18-1946, 71 y.o.   MRN: [id number]   CC: DM follow up   DM Feels she is doing well. She is self titrating Lantus at night. She is taking 100 units most nights, but if she has a low sugar one morning she will go down to 80 units that night. Reports mostly doing 100 units, 80 units is rare. AM CBGs range from 64-100. Usually less than 100. She reports 2 readings higher than 100, one 109 and one 140 after eating sweets the night before. She reports only taking glipizide in the morning. She forgets to take the PM dose. She has some feelings of hypoglycemia will feel shaky and "not herself" on mornings if sugar is <80. She is working on diet, trying to eat more fruits. She is having trouble with walking for exercise due to back pain. Denies increased thirst, hunger, urination.  Back Pain Has had back pain chronically for years. Blames it on previous job where she had to lift heavy things frequently. Has seen PT in past with some improvement. Has had imaging in past and was told she has OA in her lumbar spine. She reports that recently the pain is so bad it interferes with her desire to exercise as walking is painful. She has pain at church sitting or standing. Has pain that interferes with her doing housework. She describes it as sharp pain at times but mostly is a dull ache. She has tried the baclofen prescribed from last visit but it made her feel woozy. She has tried aleve and tylenol with no relief. Takes gabapentin for neuropathy but does not help back pain. Has tried stretching and heating pads with minimal relief. Denies bladder/bowel incontinence or retention, no saddle paresthesias    Neuropathy Takes gabapentin at night with good relief. Cannot tolerate taking it twice a day feels fatigued with this.  HTN Compliant with medications, never misses a dose. No side effects. No CP, SOB, vision changes, headaches  Smoking status  reviewed- former smoker >50pack years  Review of Systems- see HPI   Objective:  BP 130/64   Pulse 72   Temp 97.8 F (36.6 C) (Oral)   Ht 5\' 3"  (1.6 m)   Wt 268 lb 6.4 oz (121.7 kg)   SpO2 98%   BMI 47.54 kg/m  Vitals and nursing note reviewed  General: well nourished, in no acute distress Cardiac: RRR, clear S1 and S2, no murmurs, rubs, or gallops Extremities: no edema or cyanosis. Back: tender to palpation over right SI joint.  Neuro: alert and oriented, no focal deficits, strength 5/5  Assessment & Plan:    Essential hypertension  Chronic. BP at goal today, <130/64  -continue chlorthalidone and lisinopril daily  -follow up 1 month  Type 2 diabetes mellitus with diabetic neuropathy, with long-term current use of insulin (HCC)  Chronic, poorly controlled. A1C today increased from 8.8 to 9.1  -due to low fasting CBG, split Lantus to 45 units every morning and every night  -check CBGs every morning and every night to monitor glycemic control -continue glipizide 10 mg daily- patient does not take second dose ever -patient instructed to call in 2 weeks to update on CBGs, may titrate lantus from there -follow up in 1 month  Peripheral neuropathy  Chronic, controlled with gabapentin  -continue Gabapentin 300mg  every night as tolerate -glycemic control changes as above  Low back pain  Chronic, no improvement  with Aleve, stretches. Interfering with daily activities.  -encouraged continued exercise and weight loss -rx given for voltaren gel to use topically -advised patient to avoid oral NSAIDs -recommended scheduled tylenol -rx given for 50 mg tramadol to take as needed, 15 pills dispensed -patient declined referral to PT -follow up 1 month    Return in about 4 weeks (around 10/19/2016).   Lucila Maine, DO Family Medicine Resident PGY-1

## 2016-09-21 NOTE — Patient Instructions (Addendum)
  Split up your Lantus to 45 units in the morning and 45 units at night.  Please check your blood sugars in the morning AND at night. Continue taking Glipizide every morning.  Please call me in about 2 weeks to let me know how this is going for you! [phone]  Try Melatonin supplement for sleep- can get this at any pharmacy. Try 3 mg to start.  For back pain, take Tylenol every 4-6 hours, continue walking and exercising.  You can take Tramadol as needed for really bad pain. Use Voltaren gel as needed over the painful area.  Lucila Maine, DO PGY-1, [facility] 09/21/2016 9:52 AM

## 2016-09-21 NOTE — Assessment & Plan Note (Addendum)
  Chronic. BP at goal today, <130/64  -continue chlorthalidone and lisinopril daily  -follow up 1 month

## 2016-09-21 NOTE — Assessment & Plan Note (Addendum)
  Chronic, controlled with gabapentin  -continue Gabapentin 300mg  every night as tolerate -glycemic control changes as above

## 2016-09-21 NOTE — Assessment & Plan Note (Signed)
  Chronic, poorly controlled. A1C today increased from 8.8 to 9.1  -due to low fasting CBG, split Lantus to 45 units every morning and every night  -check CBGs every morning and every night to monitor glycemic control -continue glipizide 10 mg daily- patient does not take second dose ever -patient instructed to call in 2 weeks to update on CBGs, may titrate lantus from there -follow up in 1 month

## 2016-10-14 ENCOUNTER — Telehealth: Payer: Self-pay | Admitting: Family Medicine

## 2016-10-14 NOTE — Telephone Encounter (Signed)
Dr Vanetta Shawl requested pt tto call back with her daily blood sugars. Please call pt to get these amts

## 2016-10-15 ENCOUNTER — Telehealth: Payer: Self-pay | Admitting: Family Medicine

## 2016-10-15 NOTE — Telephone Encounter (Signed)
  Called Ms. Nemitz to discuss sugars. She has been taking 45 units Lantus in the morning and 45 units at night. She is taking 10 mg glipizide in the morning. We both agree that her morning sugars are ok but her sugars at night are higher than we would like. She eats potatoes, [place], pastas, cereals during the day. She usually only eats 2 meals a day. She denies symptoms of hypoglycemia like irritability, confusion, shakiness. She states she dislikes sticking herself twice with the Lantus. The morning dose of Lantus does not seem to control nighttime sugars but the lower dose of Lantus in the evening is doing well to avoid very low sugars in the morning.  She has an appointment with me in July. She uses [facility] or her daughter for transportation and can only do morning appointments. Unfortunately I don't have any morning appointments available in June.   She is interested in seeing Dr. Valentina Lucks in the meantime to discuss sugar controls. Appointment made with him for 2 weeks.   Lucila Maine, DO PGY-1, [facility] 10/15/2016 10:52 AM

## 2016-10-15 NOTE — Telephone Encounter (Signed)
I called pt to FU with her sugars.   5/17: Morning 138 Night 332 5/18: 82, 382 5/19: 109, 363 5/20: 151, did not check at night  5/21: 106, 343 5/22: did not check in morning, 135 5/25: 185, did not check at night  5/26: 171, 340 5/27: 243, 361   All using 45 units

## 2016-10-29 ENCOUNTER — Encounter: Payer: Self-pay | Admitting: Pharmacist

## 2016-10-29 ENCOUNTER — Ambulatory Visit (INDEPENDENT_AMBULATORY_CARE_PROVIDER_SITE_OTHER): Payer: Medicare HMO | Admitting: Pharmacist

## 2016-10-29 DIAGNOSIS — E1140 Type 2 diabetes mellitus with diabetic neuropathy, unspecified: Secondary | ICD-10-CM | POA: Diagnosis not present

## 2016-10-29 DIAGNOSIS — I10 Essential (primary) hypertension: Secondary | ICD-10-CM | POA: Diagnosis not present

## 2016-10-29 DIAGNOSIS — Z794 Long term (current) use of insulin: Secondary | ICD-10-CM

## 2016-10-29 MED ORDER — LIRAGLUTIDE 18 MG/3ML ~~LOC~~ SOPN: 0.6000 mg | PEN_INJECTOR | Freq: Every day | SUBCUTANEOUS | 2 refills | Status: DC

## 2016-10-29 MED ORDER — LOSARTAN POTASSIUM 100 MG PO TABS: 100.0000 mg | ORAL_TABLET | Freq: Every day | ORAL | 3 refills | Status: DC

## 2016-10-29 NOTE — Progress Notes (Signed)
    S:     Chief Complaint  Patient presents with  . Medication Management    diabetes   Patient arrives ambulating without assistance.  Presents for diabetes evaluation, education, and management at the request of Dr. Vanetta Shawl. Patient was referred on 10/15/16.  Patient was last seen by Primary Care Provider on 09/21/16.   Patient reports Diabetes was diagnosed in 2001.   Family/Social History:   Patient reports adherence with medications.  Current diabetes medications include: glipizide 10mg  once daily, insulin glargine 45 units BID at 11-12 pm (when she wakes up) and 9-10 pm. She has been on insulin therapy for ~10 years.  Current hypertension medications include: lisnopril 40mg  once daily, metoprolol XL 25mg  once daily, chlorthalidone 25 mg daily. Reports a dry cough at night.   Patient denies hypoglycemic events since insulin glargine changed to 45 units BID.   Patient reported dietary habits: Usually eats 2 meals/day (Lunch and Holiday representative) Breakfast:Eggs, bacon, toast, jam or cereal (fruit loops, corn flakes, or raisin bran) Lunch/Dinner: steamed vegetables, boiled/baked/fried chicken (fried most common), potatoes (mashed, fried, baked), hot dogs, deli meats ([place] and ham), fruits, ice cream (~1 sandwich/week), Chips (1 bag=~2-3 days) Drinks: 12-16 ounces of orange crush soda and 3 glasses of juice per day  Patient reports nocturia ~ 3x per night Patient reports neuropathy, for which gabapentin is not helping.   O:  Physical Exam  Constitutional: She appears well-developed and well-nourished.   Review of Systems  All other systems reviewed and are negative.  Lab Results  Component Value Date   HGBA1C 9.1 09/21/2016   Vitals:   10/29/16 0936  BP: 136/72    Home fasting CBG: 150s  2 hour post-prandial/random CBG: 300s-400s.  10 year ASCVD risk: cannot be accurately assessed without lipid panel.  Dry cough - bronchitis, sometimes comes up at night Hypoglycemic - shaky,  feel weak, sweating Been on insulin 10 years - weight hasnt changed Wake up at least 3 times to go to bathroom at night Usually dont eat until 1-2 pm, takes BG then lantus 11-12 pm, then at bedtime (9-10 pm), next meal at 8:30 pm, dont really eat snacks  A/P: Diabetes longstanding currently uncontrolled with last A1C = 9.1. Patient denies hypoglycemic events and is able to verbalize appropriate hypoglycemia management plan. Patient reports adherence with medication. Control is suboptimal due to diet and poorly optimized regimen. Started Victoza (liraglutide) 0.6mg  with plans to titrate to 1.2mg  daily in one week and discussed limiting sodas/juices. Next A1C anticipated August 2018.    ASCVD risk greater than 7.5%. Continued Aspirin 81 mg and Continued rosuvastatin 40 mg.   Hypertension longstanding diagnosed currently on lisinopril, chlorthalidone, and metoprolol.  Patient reports adherence with medication. Control is optimal; however, patient is reporting dry cough at night. Due to cough and evidence with lowering uric acid, start losartan in place of lisinopril.   Written patient instructions provided.  Total time in face to face counseling 45 minutes.   Follow up in Pharmacist Clinic Visit 2 weeks.  Patient seen with Shelle Iron, PharmD Candidate, and Loney Hering, PharmD PGY-1 Resident.

## 2016-10-29 NOTE — Patient Instructions (Addendum)
Start Victoza injection - inject 0.6mg  under the skin for 7 days, then increase that to 1.2mg  everyday  Stop lisinopril when you get your new losartan prescription Take losartan 100mg  daily Return to the clinic in ~2 weeks Call us if there is nausea and vomiting

## 2016-10-30 NOTE — Assessment & Plan Note (Signed)
Diabetes longstanding currently uncontrolled with last A1C = 9.1. Patient denies hypoglycemic events and is able to verbalize appropriate hypoglycemia management plan. Patient reports adherence with medication. Control is suboptimal due to diet and poorly optimized regimen. Started Victoza (liraglutide) 0.6mg  with plans to titrate to 1.2mg  daily in one week and discussed limiting sodas/juices. Next A1C anticipated August 2018.

## 2016-10-30 NOTE — Assessment & Plan Note (Signed)
Hypertension longstanding diagnosed currently on lisinopril, chlorthalidone, and metoprolol.  Patient reports adherence with medication. Control is optimal; however, patient is reporting dry cough at night. Due to cough and evidence with lowering uric acid, start losartan in place of lisinopril.

## 2016-11-02 NOTE — Progress Notes (Signed)
Patient ID: Michelle Abbott, female   DOB: 18-Apr-1946, 71 y.o.   MRN: [id number] Reviewed: Agree with Dr. Graylin Shiver documentation and management.

## 2016-11-12 ENCOUNTER — Ambulatory Visit: Payer: Medicare HMO | Admitting: Pharmacist

## 2016-11-17 ENCOUNTER — Encounter: Payer: Self-pay | Admitting: Family Medicine

## 2016-11-17 DIAGNOSIS — I519 Heart disease, unspecified: Secondary | ICD-10-CM | POA: Insufficient documentation

## 2016-11-17 DIAGNOSIS — I5189 Other ill-defined heart diseases: Secondary | ICD-10-CM | POA: Insufficient documentation

## 2016-11-17 DIAGNOSIS — M5430 Sciatica, unspecified side: Secondary | ICD-10-CM | POA: Insufficient documentation

## 2016-11-20 ENCOUNTER — Encounter: Payer: Self-pay | Admitting: Family Medicine

## 2016-11-26 ENCOUNTER — Ambulatory Visit: Payer: Medicare HMO | Admitting: Pharmacist

## 2016-11-27 ENCOUNTER — Encounter: Payer: Self-pay | Admitting: Family Medicine

## 2016-11-27 ENCOUNTER — Ambulatory Visit (INDEPENDENT_AMBULATORY_CARE_PROVIDER_SITE_OTHER): Payer: Medicare HMO | Admitting: Family Medicine

## 2016-11-27 VITALS — BP 122/68 | HR 92 | Temp 98.5°F | Wt 259.0 lb

## 2016-11-27 DIAGNOSIS — R5383 Other fatigue: Secondary | ICD-10-CM | POA: Diagnosis not present

## 2016-11-27 DIAGNOSIS — N183 Chronic kidney disease, stage 3 unspecified: Secondary | ICD-10-CM

## 2016-11-27 DIAGNOSIS — I10 Essential (primary) hypertension: Secondary | ICD-10-CM | POA: Diagnosis not present

## 2016-11-27 DIAGNOSIS — Z794 Long term (current) use of insulin: Secondary | ICD-10-CM

## 2016-11-27 DIAGNOSIS — E1140 Type 2 diabetes mellitus with diabetic neuropathy, unspecified: Secondary | ICD-10-CM | POA: Diagnosis not present

## 2016-11-27 MED ORDER — TRAMADOL HCL 50 MG PO TABS: 50.0000 mg | ORAL_TABLET | Freq: Three times a day (TID) | ORAL | 0 refills | Status: DC | PRN

## 2016-11-27 NOTE — Assessment & Plan Note (Addendum)
  Chronic, well controlled. At goal today < 130/80  -continue current regimen -check BMP today -follow up 1 month

## 2016-11-27 NOTE — Patient Instructions (Addendum)
  It was great seeing you today!  Please continue what you are doing, I know you're making a big effort!  Try to eat breakfast every day. Try to walk for exercise a few times a week.   If you have questions or concerns please do not hesitate to call at [phone].  Lucila Maine, DO PGY-2, [facility] 11/27/2016 10:00 AM

## 2016-11-27 NOTE — Assessment & Plan Note (Signed)
  Chronic, not at goal.   -follow up with Dr. Valentina Lucks as scheduled -continue Victoza, glipizide and lantus -consider decreasing Lantus dose at night if fasting sugars continue to be low, this has not been consistent -encouraged patient to eat 3 meals a day and increase exercise -consider referral to Dr. Jenne Campus in future if nutrition continues to be an issue, at this time patient has a hard time getting to clinic so held off today

## 2016-11-27 NOTE — Assessment & Plan Note (Signed)
  Patient endorses recent fatigue, denies depression. Could be secondary to sedentary lifestyle.  -check CBC and TSH today -encouraged walking for exercise -follow up 1 month

## 2016-11-27 NOTE — Progress Notes (Signed)
    Subjective:    Patient ID: Michelle Abbott, female    DOB: December 11, 1945, 71 y.o.   MRN: [id number]  CC: follow up on DM.   DM- started victoza 0.6 mg and then titrated up to 1.2, tolerating well. Had some nausea initially.   Her eating habits have not improved, she is not hungry in the mornings and won't eat until late lunch. She is taking 45 units BID but in the mornings if sugars are low she won't take the morning dose of Lantus until later in the day.   Her evening sugars are usually >250, as high as 400. She reports mostly eating one large meal at night.  She reports she is not on a good schedule. In the past she would eat a "good breakfast" then dinner she would have some greens and maybe a piece of meat.  She reports low appetite. She is tired of checking sugars and being diabetic.  HTN- was switched from lisinopril to losartan due to cough, cough has subsided. Doing well on this medication.   Social Had a car previously in [place] but it broke down so she is without transportation.  Bus stop is close to house but she has to cross a very busy street to get to it which makes her nervous. She has a difficult time getting across the street quickly.  Smoking status reviewed- former smoker  Review of Systems- denies depression, suicidal ideation  Objective:  BP 122/68   Pulse 92   Temp 98.5 F (36.9 C) (Oral)   Wt 259 lb (117.5 kg)   SpO2 96%   BMI 53.39 kg/m  Vitals and nursing note reviewed  General: well nourished, in no acute distress Cardiac: RRR, clear S1 and S2, no murmurs, rubs, or gallops Respiratory: clear to auscultation bilaterally, no increased work of breathing Extremities: no edema or cyanosis. Skin: warm and dry, no rashes noted Neuro: alert and oriented, no focal deficits  Assessment & Plan:    Essential hypertension  Chronic, well controlled. At goal today < 130/80  -continue current regimen -check BMP today -follow up 1 month  Type 2 diabetes  mellitus with diabetic neuropathy, with long-term current use of insulin (HCC)  Chronic, not at goal.   -follow up with Dr. Valentina Lucks as scheduled -continue Victoza, glipizide and lantus -consider decreasing Lantus dose at night if fasting sugars continue to be low, this has not been consistent -encouraged patient to eat 3 meals a day and increase exercise -consider referral to Dr. Jenne Campus in future if nutrition continues to be an issue, at this time patient has a hard time getting to clinic so held off today  Fatigue  Patient endorses recent fatigue, denies depression. Could be secondary to sedentary lifestyle.  -check CBC and TSH today -encouraged walking for exercise -follow up 1 month    Return in about 4 weeks (around 12/25/2016).   Lucila Maine, DO Family Medicine Resident PGY-2

## 2016-11-28 LAB — CBC
Hematocrit: 34 % (ref 34.0–46.6)
Hemoglobin: 10.5 g/dL — ABNORMAL LOW (ref 11.1–15.9)
MCH: 25.7 pg — ABNORMAL LOW (ref 26.6–33.0)
MCHC: 30.9 g/dL — ABNORMAL LOW (ref 31.5–35.7)
MCV: 83 fL (ref 79–97)
PLATELETS: 284 10*3/uL (ref 150–379)
RBC: 4.08 x10E6/uL (ref 3.77–5.28)
RDW: 17.1 % — ABNORMAL HIGH (ref 12.3–15.4)
WBC: 6.1 10*3/uL (ref 3.4–10.8)

## 2016-11-28 LAB — BASIC METABOLIC PANEL
BUN/Creatinine Ratio: 14 (ref 12–28)
BUN: 25 mg/dL (ref 8–27)
CALCIUM: 9.7 mg/dL (ref 8.7–10.3)
CHLORIDE: 105 mmol/L (ref 96–106)
CO2: 25 mmol/L (ref 20–29)
Creatinine, Ser: 1.76 mg/dL — ABNORMAL HIGH (ref 0.57–1.00)
GFR calc Af Amer: 34 mL/min/{1.73_m2} — ABNORMAL LOW (ref >59)
GFR calc non Af Amer: 29 mL/min/{1.73_m2} — ABNORMAL LOW (ref >59)
Glucose: 140 mg/dL — ABNORMAL HIGH (ref 65–99)
POTASSIUM: 4 mmol/L (ref 3.5–5.2)
Sodium: 145 mmol/L — ABNORMAL HIGH (ref 134–144)

## 2016-11-28 LAB — TSH: TSH: 3.19 u[IU]/mL (ref 0.450–4.500)

## 2016-12-03 ENCOUNTER — Telehealth: Payer: Self-pay | Admitting: Family Medicine

## 2016-12-03 ENCOUNTER — Other Ambulatory Visit: Payer: Self-pay | Admitting: Family Medicine

## 2016-12-03 ENCOUNTER — Encounter: Payer: Self-pay | Admitting: Pharmacist

## 2016-12-03 ENCOUNTER — Ambulatory Visit (INDEPENDENT_AMBULATORY_CARE_PROVIDER_SITE_OTHER): Payer: Medicare HMO | Admitting: Pharmacist

## 2016-12-03 DIAGNOSIS — Z794 Long term (current) use of insulin: Secondary | ICD-10-CM | POA: Diagnosis not present

## 2016-12-03 DIAGNOSIS — N183 Chronic kidney disease, stage 3 unspecified: Secondary | ICD-10-CM

## 2016-12-03 DIAGNOSIS — E1140 Type 2 diabetes mellitus with diabetic neuropathy, unspecified: Secondary | ICD-10-CM

## 2016-12-03 MED ORDER — LANTUS SOLOSTAR 100 UNIT/ML ~~LOC~~ SOPN: 42.0000 [IU] | PEN_INJECTOR | Freq: Two times a day (BID) | SUBCUTANEOUS | 0 refills | Status: DC

## 2016-12-03 MED ORDER — LIRAGLUTIDE 18 MG/3ML ~~LOC~~ SOPN: 1.8000 mg | PEN_INJECTOR | Freq: Every day | SUBCUTANEOUS | 2 refills | Status: DC

## 2016-12-03 NOTE — Progress Notes (Signed)
Patient ID: Michelle Abbott, female   DOB: May 04, 1946, 71 y.o.   MRN: [id number] Reviewed: Agree with Dr. Graylin Shiver documentation and management.

## 2016-12-03 NOTE — Telephone Encounter (Signed)
Spoke with patient and she has not seen a kidney doctor before. Jazmin Hartsell,CMA

## 2016-12-03 NOTE — Progress Notes (Signed)
    S:    Chief Complaint  Patient presents with  . Medication Management    diabetes   Patient arrives in good spirits ambulating without assistance.  Presents for diabetes evaluation, education, and management at the request of Dr Vanetta Shawl. Patient was referred on 10/15/16.  Patient was last seen by Primary Care Provider on 11/27/16.   Chills routinely in the last few weeks. She reports night sweats with wet hair 1-2 times per week.    Patient reports adherence with medications.  Current diabetes medications include: Victoza 1.2 mg daily, Glipizide 10 mg daily, Lantus 45 units BID Current hypertension medications include: losartan 100 mg daily, chlorthalidone 25 mg daily, metoprolol succinate 25 mg daily   Patient denies hypoglycemic events.  Patient reported dietary habits: Eats 1-2 meals/day Breakfast/Lunch: Sweetened cereal  Dinner: meat with mashed/baked potatoes, egg salad, PB&J sandwich Snacks: fruits Cannot afford a lot of more healthy foods.  She believes she needs to cut back on bread.    Patient reported exercise habits:    Patient reports nocturia 2-3 times per night (decreased from 3-4x).  Patient reports neuropathy in feet. Only taking gabapentin QHS due to dizziness if takes AM dose. Gapapentin helping but still having it some.  Reports checking CBG late at night (> 2 hours) and fasting CBG  Gout:  Reports last gout attack > 1 year ago.    O:  Physical Exam  Constitutional: She appears well-developed and well-nourished.  Vitals reviewed.  Review of Systems  Constitutional: Positive for chills. Negative for fever.    Lab Results  Component Value Date   HGBA1C 9.1 09/21/2016   Vitals:   12/03/16 1044  BP: 130/62  Pulse: 87  Temp: 98.1 F (36.7 C)    Home fasting CBG: 115, 110,  176, 126, 136 2 hour post-prandial/random CBG: 452, 266, 234  A/P: Diabetes longstanding diagnosed currently uncontrolled but with improved CBGs.  Fasting CBGs are near goal  but post prandials remain above 200 mg/dL. Patient denies hypoglycemic events and is able to verbalize appropriate hypoglycemia management plan. Patient reports adherence with medication. Control is suboptimal due to insulin resistance. -Decreased dose of basal insulin Lantus (insulin glargine) to 42 units twice daily  -Increased dose of Victoza (liraglutide) to 1.8 mg daily.  Increase by 1 "click per day" from 1.2 mg daily until reaching 1.8 mg or until feeling increased nausea.  -Discussed making a walk after dinner and discussed strategies to lower carbohydrates and decreasing portion sizes.  -Next A1C anticipated 09/21/16.    ASCVD risk greater than 7.5%. Continued Aspirin 81 mg and Continued rosuvastatin 40 mg daily.  Consider lipid panel at next visit.   Hypertension longstanding diagnosed currently controlled.  Patient reports adherence with medication. Cough has resolved since switch from ACEI to ARB. Continue current medications.  Chills: patient reports night sweats and chills several times per week.  Case discussed with Dr Mingo Amber.  Patient will followup with Dr Vanetta Shawl for further evaluation.   Written patient instructions provided.  Total time in face to face counseling 35 minutes.   Follow up in Pharmacist Clinic Visit in 6-8 weeks and Dr Vanetta Shawl in 2-3 weeks.   Patient seen with Bennye Alm, PharmD, BCPS

## 2016-12-03 NOTE — Telephone Encounter (Signed)
Left voicemail for Ms. Michelle Abbott about her lab results. Would like to know if she has seen a nephrologist in the past. I plan to refer to nephrology for CKD3 with worsening GFR and also anemia likely related to her kidney disease, asked her to please call back.  Lucila Maine, DO PGY-2, [facility] 12/03/2016 9:24 AM

## 2016-12-03 NOTE — Assessment & Plan Note (Signed)
Diabetes longstanding diagnosed currently uncontrolled but with improved CBGs.  Fasting CBGs are near goal but post prandials remain above 200 mg/dL. Patient denies hypoglycemic events and is able to verbalize appropriate hypoglycemia management plan. Patient reports adherence with medication. Control is suboptimal due to insulin resistance. -Decreased dose of basal insulin Lantus (insulin glargine) to 42 units twice daily  -Increased dose of Victoza (liraglutide) to 1.8 mg daily.  Increase by 1 "click per day" from 1.2 mg daily until reaching 1.8 mg or until feeling increased nausea.  -Discussed making a walk after dinner and discussed strategies to lower carbohydrates and decreasing portion sizes.  -Next A1C anticipated 09/21/16.

## 2016-12-03 NOTE — Patient Instructions (Addendum)
-  Decrease dose of basal insulin Lantus (insulin glargine) to 42 units twice daily  -Increase dose of Victoza (liraglutide) to 1.8 mg daily -- Increase by 1 "click per day" from 1.2 mg daily until reaching 1.8 mg or until feeling increased nausea.   -Call clinic if you experience low blood sugars <80 mg/dL or if you have increased nausea.   Followup with Dr Vanetta Shawl in 2-3 weeks and in pharmacy clinic in 6-8 weeks

## 2016-12-28 ENCOUNTER — Ambulatory Visit (INDEPENDENT_AMBULATORY_CARE_PROVIDER_SITE_OTHER): Payer: Medicare HMO | Admitting: Family Medicine

## 2016-12-28 ENCOUNTER — Encounter: Payer: Self-pay | Admitting: Family Medicine

## 2016-12-28 VITALS — BP 146/86 | HR 90 | Temp 98.2°F | Ht 63.0 in | Wt 258.0 lb

## 2016-12-28 DIAGNOSIS — E782 Mixed hyperlipidemia: Secondary | ICD-10-CM | POA: Diagnosis not present

## 2016-12-28 DIAGNOSIS — R6889 Other general symptoms and signs: Secondary | ICD-10-CM | POA: Diagnosis not present

## 2016-12-28 DIAGNOSIS — Z794 Long term (current) use of insulin: Secondary | ICD-10-CM

## 2016-12-28 DIAGNOSIS — R251 Tremor, unspecified: Secondary | ICD-10-CM

## 2016-12-28 DIAGNOSIS — E1140 Type 2 diabetes mellitus with diabetic neuropathy, unspecified: Secondary | ICD-10-CM

## 2016-12-28 DIAGNOSIS — Z1211 Encounter for screening for malignant neoplasm of colon: Secondary | ICD-10-CM

## 2016-12-28 LAB — POCT GLYCOSYLATED HEMOGLOBIN (HGB A1C): HEMOGLOBIN A1C: 8

## 2016-12-28 MED ORDER — POLYETHYLENE GLYCOL 3350 17 GM/SCOOP PO POWD: 17.0000 g | Freq: Two times a day (BID) | ORAL | 1 refills | Status: DC | PRN

## 2016-12-28 MED ORDER — TETANUS-DIPHTH-ACELL PERTUSSIS 5-2.5-18.5 LF-MCG/0.5 IM SUSP: 0.5000 mL | Freq: Once | INTRAMUSCULAR | 0 refills | Status: AC

## 2016-12-28 MED ORDER — GLUCOSE BLOOD VI STRP: ORAL_STRIP | 12 refills | Status: DC

## 2016-12-28 MED ORDER — ROSUVASTATIN CALCIUM 40 MG PO TABS: 40.0000 mg | ORAL_TABLET | Freq: Every day | ORAL | 2 refills | Status: DC

## 2016-12-28 NOTE — Patient Instructions (Addendum)
  Please get your mammogram done! You should hear from our referral department about scheduling with GI doctor for colonoscopy and also for the kidney doctor appointment.  If you have questions or concerns please do not hesitate to call at [phone].  Lucila Maine, DO PGY-2, [facility] 12/28/2016 2:30 PM

## 2016-12-28 NOTE — Assessment & Plan Note (Signed)
  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 Neuro exam largely normal. Little concern for PD at this time.  -continue to monitor for worsening -consider referral to neurology in future

## 2016-12-28 NOTE — Progress Notes (Signed)
    Subjective:    Patient ID: Michelle Abbott, female    DOB: [birth year], 71 y.o.   MRN: [id number]   CC: DM follow up  DM -on glipizide, victoza, and 42 units lantus BID -appt with Dr. Valentina Lucks next week -denies hypoglycemic events -evening sugars have been "variable" have not gone down below 200, as high as 300 -has had a few in 90's, maybe one in the 80's. She felt shaky at that time. She feels a little unwell if she gets below 110. She will eat "something sweet" or drink some juice if they are low  Chills -happen randomly, sometimes if she drink something cold -has to turn [facility] off due to being cold -denies fevers, dysuria, no cough -endorses constipation and sweating at night -has lost about 10 pounds- thinks this is due to changing diet -smoking hx, 45 pack years  Hand tremors -has tremor bilaterally when she grabs something like a cup or a pen -shaking starts once she picks it up -sometimes it just shakes at rest -gripping makes it worse -lasts a few minutes -unrelated to low blood sugars -father had hand tremor as he aged -older brother also has same tremor  Smoking status reviewed- former smoker  Review of Systems- see HPI   Objective:  BP (!) 146/86   Pulse 90   Temp 98.2 F (36.8 C) (Oral)   Ht 5\' 3"  (1.6 m)   Wt 258 lb (117 kg)   SpO2 94%   BMI 45.70 kg/m  Vitals and nursing note reviewed  General: well nourished, in no acute distress Cardiac: RRR, clear S1 and S2, no murmurs, rubs, or gallops Respiratory: clear to auscultation bilaterally, no increased work of breathing Abdomen: soft, nontender, nondistended, no masses or organomegaly. 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 Neuro exam largely normal. Little concern for PD at this  time.  -continue to monitor for worsening -consider referral to neurology in future  Type 2 diabetes mellitus with diabetic neuropathy, with long-term current use of insulin (HCC)  Chronic, improving- A1C today 8.0  -continue current regimen -follow up in pharmacy clinic with Dr. Lenora Boys intolerance  Unclear etiology, TSH normal at last check. Mildly anemic likely due to CKD. Patient on iron supplementation.  -likely benign etiology -continue to monitor  Health maintenance -referral to GI for colonoscopy made -information to get mammogram given to patient -rx for Tdap given to patient  Return in about 3 months (around 03/30/2017).   Lucila Maine, DO Family Medicine Resident PGY-2

## 2016-12-28 NOTE — Assessment & Plan Note (Signed)
  Chronic, improving- A1C today 8.0  -continue current regimen -follow up in pharmacy clinic with Dr. Valentina Lucks

## 2016-12-28 NOTE — Assessment & Plan Note (Signed)
  Unclear etiology, TSH normal at last check. Mildly anemic likely due to CKD. Patient on iron supplementation.  -likely benign etiology -continue to monitor

## 2017-01-04 ENCOUNTER — Ambulatory Visit: Payer: Medicare HMO | Admitting: Pharmacist

## 2017-01-07 ENCOUNTER — Encounter: Payer: Self-pay | Admitting: Pharmacist

## 2017-01-07 ENCOUNTER — Ambulatory Visit (INDEPENDENT_AMBULATORY_CARE_PROVIDER_SITE_OTHER): Payer: Medicare HMO | Admitting: Pharmacist

## 2017-01-07 DIAGNOSIS — Z794 Long term (current) use of insulin: Secondary | ICD-10-CM

## 2017-01-07 DIAGNOSIS — E1140 Type 2 diabetes mellitus with diabetic neuropathy, unspecified: Secondary | ICD-10-CM

## 2017-01-07 NOTE — Assessment & Plan Note (Signed)
Diabetes longstanding improved, with A1c 8% and improved BG. Patient denies hypoglycemic events and is able to verbalize appropriate hypoglycemia management plan. Patient reports adherence with medication. Control is suboptimal due to insulin resistance, poor diet, and sedentary lifestyle. -Discontinued once daily IR glipizide, has been on >10 year and likely is not contributing to glycemic control at this time -Counseled patient on increasing exercise frequency. Discussed exercises that do not cause back pain. Patient goal of 10 minutes of stretching, 2x/week.  -Discussed adding prandial insulin, patient was resistant to additional shots

## 2017-01-07 NOTE — Progress Notes (Signed)
    S:     Chief Complaint  Patient presents with  . Medication Management    diabetes    Patient arrives in fair spirits, ambulating without assistance.  Presents for diabetes evaluation, education, and management at the request of Dr. Vanetta Shawl. Patient was referred on 10/15/16.  Patient was last seen by Primary Care Provider on 12/28/16.   States that she has been eating better, is encouraged by A1C of 8 and sustained 10 lb weight loss.   Patient reports Diabetes was diagnosed in 2001.   Patient reports adherence with medications.  Current diabetes medications include: Lantus 42 units BID, glipizide 10 mg daily, Victoza 1.8 mg daily,  Current hypertension medications include: Losartan 100 mg daily, metoprolol succinate 25 mg daily  Patient denies hypoglycemic events. One home BG reading of 92, patient reports feeling some symptoms.   Patient reported dietary habits: Eats 1-2 meals/day. Reports low appetite with victoza. Breakfast:doesnt eat breakfast Lunch: bologna sandwich with tomato, on honey wheat bread Dinner: bologna sandwich with tomato, on honey wheat bread Snacks: watermelon (seasonal) Drinks: water, minute maid (not every day)  Patient reported exercise habits: walks while shopping ~1x/week. Patient does some stretches, which does not bother her back. Reports back pain while walking.    Patient reports nocturia. 1-2x/night Patient reports neuropathy. In hands and feet. Reports that hands shake Patient denies visual changes. Has not had a visual exam this year Patient reports self foot exams.    O:  Physical Exam  Constitutional: She appears well-developed and well-nourished.  Vitals reviewed.    Review of Systems  Musculoskeletal: Positive for back pain.  Neurological: Positive for tingling and tremors.  All other systems reviewed and are negative.    Lab Results  Component Value Date   HGBA1C 8.0 12/28/2016   There were no vitals filed for this  visit.  Home fasting CBG: 90-180,  >200  1-2x per week  2 hour post-prandial/random CBG: 200s-300s.  A/P: Diabetes longstanding improved, with A1c 8% and improved BG. Patient denies hypoglycemic events and is able to verbalize appropriate hypoglycemia management plan. Patient reports adherence with medication. Control is suboptimal due to insulin resistance, poor diet, and sedentary lifestyle. -Discontinued once daily IR glipizide, has been on >10 year and likely is not contributing to glycemic control at this time -Counseled patient on increasing exercise frequency. Discussed exercises that do not cause back pain. Patient goal of 10 minutes of stretching, 2x/week.  -Discussed adding prandial insulin, patient was resistant to additional shots -Asked to call if she has blood sugars less than 90.    ASCVD risk greater than 7.5%. Continued Aspirin 81 mg and Continued rosuvastatin 40 mg.   Hypertension longstanding currently slightly uncontrolled. Patient reports adherence with medication. Control is suboptimal due to obese BMI, sedentary lifestyle, and dietary indiscretion.  Written patient instructions provided.  Total time in face to face counseling 20 minutes.   Follow up with PCP in November, and with pharmacist PRN.   Patient seen with Cleotis Lema, PharmD Candidate, Danella Penton, PharmD Candidate, and Deirdre Pippins, PharmD, PGY2 Resident.

## 2017-01-07 NOTE — Patient Instructions (Addendum)
Thank you for coming to see Michelle Abbott today! Your A1c is 8%  1. Stop taking your glipizide.   2. Continue checking your blood sugar and keep up your log. Good work!  3. Try to increase your exercising, we discussed stretching for 10 minutes 2 times a week. Try to walk if your back is not bothering you  Follow up with Dr. Vanetta Shawl in November

## 2017-01-12 ENCOUNTER — Telehealth: Payer: Self-pay | Admitting: *Deleted

## 2017-01-12 NOTE — Telephone Encounter (Signed)
I will await contact from [place] or [place] from [facility] if patient needs authorization. I highly doubt it as [employer] no longer requires referrals. I have had issues in th previously with [facility] advising patient they needed referrals and they ended up not needing them.

## 2017-01-12 NOTE — Telephone Encounter (Signed)
Patient needs a new Rx for an accu-check meter.  Her insurance does not cover [employer] meters.  Will forward to MD.  Michelle Abbott, Salome Spotted, [place]

## 2017-01-12 NOTE — Telephone Encounter (Signed)
Pt has appt with Michelle Abbott on 02/01/17.  She called her insurance to find out her copay and they informed her that her appt needs authorization and that we needed to call [phone] to get this approved.  [place], Salome Spotted, CMA

## 2017-01-13 ENCOUNTER — Other Ambulatory Visit: Payer: Self-pay | Admitting: Family Medicine

## 2017-01-13 MED ORDER — ACCU-CHEK AVIVA DEVI: 0 refills | Status: AC

## 2017-01-13 NOTE — Telephone Encounter (Signed)
rx sent in for accu chek device

## 2017-02-01 DIAGNOSIS — E1140 Type 2 diabetes mellitus with diabetic neuropathy, unspecified: Secondary | ICD-10-CM | POA: Diagnosis not present

## 2017-02-01 DIAGNOSIS — I129 Hypertensive chronic kidney disease with stage 1 through stage 4 chronic kidney disease, or unspecified chronic kidney disease: Secondary | ICD-10-CM | POA: Diagnosis not present

## 2017-02-01 DIAGNOSIS — M109 Gout, unspecified: Secondary | ICD-10-CM | POA: Diagnosis not present

## 2017-02-01 DIAGNOSIS — D631 Anemia in chronic kidney disease: Secondary | ICD-10-CM | POA: Diagnosis not present

## 2017-02-01 DIAGNOSIS — E785 Hyperlipidemia, unspecified: Secondary | ICD-10-CM | POA: Diagnosis not present

## 2017-02-01 DIAGNOSIS — N183 Chronic kidney disease, stage 3 (moderate): Secondary | ICD-10-CM | POA: Diagnosis not present

## 2017-02-01 DIAGNOSIS — E1129 Type 2 diabetes mellitus with other diabetic kidney complication: Secondary | ICD-10-CM | POA: Diagnosis not present

## 2017-02-01 DIAGNOSIS — M1990 Unspecified osteoarthritis, unspecified site: Secondary | ICD-10-CM | POA: Diagnosis not present

## 2017-02-03 ENCOUNTER — Other Ambulatory Visit: Payer: Self-pay | Admitting: Nephrology

## 2017-02-03 DIAGNOSIS — N183 Chronic kidney disease, stage 3 unspecified: Secondary | ICD-10-CM

## 2017-02-08 ENCOUNTER — Ambulatory Visit
Admission: RE | Admit: 2017-02-08 | Discharge: 2017-02-08 | Disposition: A | Discharge status: Home / Self Care | Payer: Medicare HMO | Source: Ambulatory Visit | Attending: Nephrology | Admitting: Nephrology

## 2017-02-08 DIAGNOSIS — N183 Chronic kidney disease, stage 3 unspecified: Secondary | ICD-10-CM

## 2017-02-09 ENCOUNTER — Other Ambulatory Visit: Payer: Medicare HMO

## 2017-04-01 ENCOUNTER — Encounter: Payer: Self-pay | Admitting: Family Medicine

## 2017-04-05 ENCOUNTER — Other Ambulatory Visit: Payer: Self-pay | Admitting: *Deleted

## 2017-04-06 MED ORDER — TRAMADOL HCL 50 MG PO TABS: 50.0000 mg | ORAL_TABLET | Freq: Three times a day (TID) | ORAL | 0 refills | Status: DC | PRN

## 2017-04-06 NOTE — Telephone Encounter (Signed)
Patient informed that script is ready for pick up. Jazmin Hartsell,CMA

## 2017-04-21 ENCOUNTER — Other Ambulatory Visit: Payer: Self-pay | Admitting: Family Medicine

## 2017-04-21 DIAGNOSIS — Z1231 Encounter for screening mammogram for malignant neoplasm of breast: Secondary | ICD-10-CM

## 2017-04-26 ENCOUNTER — Ambulatory Visit: Payer: Medicare HMO | Admitting: Family Medicine

## 2017-05-02 ENCOUNTER — Encounter (HOSPITAL_COMMUNITY): Payer: Self-pay | Admitting: Emergency Medicine

## 2017-05-02 ENCOUNTER — Emergency Department (HOSPITAL_COMMUNITY): Payer: Medicare HMO

## 2017-05-02 ENCOUNTER — Emergency Department (HOSPITAL_COMMUNITY)
Admission: EM | Admit: 2017-05-02 | Discharge: 2017-05-02 | Disposition: A | Discharge status: Home / Self Care | Payer: Medicare HMO | Attending: Emergency Medicine | Admitting: Emergency Medicine

## 2017-05-02 DIAGNOSIS — Z794 Long term (current) use of insulin: Secondary | ICD-10-CM | POA: Insufficient documentation

## 2017-05-02 DIAGNOSIS — Z87891 Personal history of nicotine dependence: Secondary | ICD-10-CM | POA: Insufficient documentation

## 2017-05-02 DIAGNOSIS — E119 Type 2 diabetes mellitus without complications: Secondary | ICD-10-CM | POA: Insufficient documentation

## 2017-05-02 DIAGNOSIS — I129 Hypertensive chronic kidney disease with stage 1 through stage 4 chronic kidney disease, or unspecified chronic kidney disease: Secondary | ICD-10-CM | POA: Diagnosis not present

## 2017-05-02 DIAGNOSIS — Z79899 Other long term (current) drug therapy: Secondary | ICD-10-CM | POA: Insufficient documentation

## 2017-05-02 DIAGNOSIS — N183 Chronic kidney disease, stage 3 (moderate): Secondary | ICD-10-CM | POA: Insufficient documentation

## 2017-05-02 DIAGNOSIS — R072 Precordial pain: Secondary | ICD-10-CM | POA: Insufficient documentation

## 2017-05-02 DIAGNOSIS — Z7982 Long term (current) use of aspirin: Secondary | ICD-10-CM | POA: Insufficient documentation

## 2017-05-02 DIAGNOSIS — R079 Chest pain, unspecified: Secondary | ICD-10-CM | POA: Diagnosis not present

## 2017-05-02 LAB — CBC
HCT: 32.1 % — ABNORMAL LOW (ref 36.0–46.0)
Hemoglobin: 9.7 g/dL — ABNORMAL LOW (ref 12.0–15.0)
MCH: 25.7 pg — ABNORMAL LOW (ref 26.0–34.0)
MCHC: 30.2 g/dL (ref 30.0–36.0)
MCV: 84.9 fL (ref 78.0–100.0)
PLATELETS: 259 10*3/uL (ref 150–400)
RBC: 3.78 MIL/uL — AB (ref 3.87–5.11)
RDW: 16.6 % — AB (ref 11.5–15.5)
WBC: 7.2 10*3/uL (ref 4.0–10.5)

## 2017-05-02 LAB — BASIC METABOLIC PANEL
Anion gap: 9 (ref 5–15)
BUN: 29 mg/dL — ABNORMAL HIGH (ref 6–20)
CHLORIDE: 104 mmol/L (ref 101–111)
CO2: 26 mmol/L (ref 22–32)
CREATININE: 1.61 mg/dL — AB (ref 0.44–1.00)
Calcium: 8.9 mg/dL (ref 8.9–10.3)
GFR, EST AFRICAN AMERICAN: 37 mL/min — AB (ref >60)
GFR, EST NON AFRICAN AMERICAN: 32 mL/min — AB (ref >60)
Glucose, Bld: 220 mg/dL — ABNORMAL HIGH (ref 65–99)
Potassium: 3.7 mmol/L (ref 3.5–5.1)
SODIUM: 139 mmol/L (ref 135–145)

## 2017-05-02 LAB — I-STAT TROPONIN, ED
TROPONIN I, POC: 0 ng/mL (ref 0.00–0.08)
TROPONIN I, POC: 0.01 ng/mL (ref 0.00–0.08)

## 2017-05-02 MED ORDER — ACETAMINOPHEN 500 MG PO TABS
1000.0000 mg | ORAL_TABLET | Freq: Once | ORAL | Status: AC
  Administered 2017-05-02: 1000 mg via ORAL
  Filled 2017-05-02: qty 2

## 2017-05-02 MED ORDER — ALUM & MAG HYDROXIDE-SIMETH 200-200-20 MG/5ML PO SUSP
30.0000 mL | Freq: Once | ORAL | Status: AC
  Administered 2017-05-02: 30 mL via ORAL
  Filled 2017-05-02: qty 30

## 2017-05-02 NOTE — ED Provider Notes (Signed)
[facility] DEPARTMENT Provider Note   CSN: [id number] Arrival date & time: 05/02/17  0225     History   Chief Complaint Chief Complaint  Patient presents with  . Chest Pain    HPI Michelle Abbott is a 71 y.o. female.  HPI 71 year old female who reports constant chest discomfort for the majority of the day.  She states her pain began gradually and slowly increase as the day went on.  She tried hot tea thinking this was gastroesophageal reflux disease but it did not improve her symptoms.  No shortness of breath.  Reports some radiation of her pain towards her back and towards her right breast.  No known history of coronary artery disease.  No weakness of her arms or legs.  Denies back pain at this time.  She states her discomfort seems to be worse with movement and range of motion of her right shoulder.   Past Medical History:  Diagnosis Date  . Anemia   . Arthritis   . Chronic kidney disease   . Diabetes mellitus without complication ([place])   . Gout 2017  . Hyperlipidemia   . Hypertension   . Sciatica   . Vitamin D deficiency 2014    Patient Active Problem List   Diagnosis Date Noted  . Tremor of both hands 12/28/2016  . Cold intolerance 12/28/2016  . Fatigue 11/27/2016  . Sciatica 11/17/2016  . Echocardiogram shows left ventricular diastolic dysfunction 40/98/1191  . Peripheral neuropathy 09/21/2016  . CKD (chronic kidney disease), stage III ([place]) 07/21/2016  . Type 2 diabetes mellitus with diabetic neuropathy, with long-term current use of insulin ([place]) 06/15/2016  . Gout 06/15/2016  . Essential hypertension 06/15/2016  . Anemia 06/15/2016  . Vitamin D deficiency 06/15/2016  . Osteoarthritis 06/15/2016  . Hyperlipemia 06/15/2016  . Low back pain 06/15/2016    Past Surgical History:  Procedure Laterality Date  . BREAST SURGERY Right 1961   Benign lump removed  . CATARACT EXTRACTION, BILATERAL Bilateral 2017  . CESAREAN SECTION   1985  . CESAREAN SECTION  1989  . CHOLECYSTECTOMY  1999  . DILATION AND CURETTAGE OF UTERUS  2017  . KNEE ARTHROSCOPY Right   . VAGINAL SEPTOPLASTY  1966    OB History    No data available       Home Medications    Prior to Admission medications   Medication Sig Start Date End Date Taking? Authorizing Provider  allopurinol (ZYLOPRIM) 300 MG tablet Take 300 mg by mouth daily. 04/07/16  Yes [provider]  aspirin EC 81 MG tablet Take 81 mg by mouth daily.   Yes [provider]  chlorthalidone (HYGROTON) 25 MG tablet Take 25 mg by mouth daily. 04/28/16  Yes [provider]  cholecalciferol (VITAMIN D) 1000 units tablet Take 1,000 Units by mouth daily.   Yes [provider]  Cyanocobalamin (VITAMIN B-12 PO) Take 1 tablet by mouth daily.   Yes [provider]  diclofenac sodium (VOLTAREN) 1 % GEL Apply 2 g topically 4 (four) times daily. Patient taking differently: Apply 2 g topically 4 (four) times daily as needed (pain).  09/21/16  Yes Riccio, Angela C, DO  ferrous sulfate 325 (65 FE) MG tablet Take 325 mg by mouth daily. 06/12/16  Yes [provider]  gabapentin (NEURONTIN) 300 MG capsule Take 1 capsule (300 mg total) by mouth 2 (two) times daily. Patient taking differently: Take 300 mg by mouth at bedtime.  06/15/16  Yes  Lucila Maine C, DO  LANTUS SOLOSTAR 100 UNIT/ML Solostar Pen Inject 42 Units into the skin 2 (two) times daily. 12/03/16  Yes Hensel, Jamal Collin, MD  liraglutide 18 MG/3ML SOPN Inject 0.3 mLs (1.8 mg total) into the skin daily. Patient taking differently: Inject 1.8 mg into the skin daily after lunch. Victoza 12/03/16  Yes Hensel, Jamal Collin, MD  losartan (COZAAR) 100 MG tablet Take 1 tablet (100 mg total) by mouth daily. 10/29/16  Yes Hensel, Jamal Collin, MD  metoprolol succinate (TOPROL-XL) 25 MG 24 hr tablet Take 25 mg by mouth daily. 06/01/16  Yes [provider]  Multiple Vitamin (MULTIVITAMIN WITH MINERALS)  TABS tablet Take 1 tablet by mouth daily.   Yes [provider]  polyethylene glycol powder (GLYCOLAX/MIRALAX) powder Take 17 g by mouth 2 (two) times daily as needed. Patient taking differently: Take 17 g by mouth 2 (two) times daily as needed (constipation).  12/28/16  Yes Riccio, Angela C, DO  rosuvastatin (CRESTOR) 40 MG tablet Take 1 tablet (40 mg total) by mouth daily. Patient taking differently: Take 40 mg by mouth at bedtime.  12/28/16  Yes Riccio, Gardiner Rhyme, DO  traMADol (ULTRAM) 50 MG tablet Take 1 tablet (50 mg total) by mouth every 8 (eight) hours as needed. Patient taking differently: Take 50 mg by mouth every 8 (eight) hours as needed (pain).  04/06/17  Yes Riccio, Angela C, DO  B-D ULTRAFINE III SHORT PEN 31G X 8 MM MISC 270 Units by Does not apply route every morning. 06/01/16   [provider]  Blood Glucose Monitoring Suppl (ACCU-CHEK AVIVA) device Use as instructed 01/13/17 01/13/18  Steve Rattler, DO  glucose blood [facility] BLOOD GLUCOSE TEST) test strip Use as instructed 12/28/16   Steve Rattler, DO    Family History Family History  Problem Relation Age of Onset  . Cancer Mother        ovarian and pancreatic  . Diabetes Mother   . Stroke Mother   . COPD Father   . Arthritis Father   . Cancer Brother        Lung  . COPD Brother     Social History Social History   Tobacco Use  . Smoking status: Former Smoker    Packs/day: 1.50    Years: 30.00    Pack years: 45.00    Types: Cigarettes    Last attempt to quit: 05/18/1997    Years since quitting: 19.9  . Smokeless tobacco: Never Used  Substance Use Topics  . Alcohol use: No  . Drug use: No     Allergies   Ciprofloxacin hcl and Ace inhibitors   Review of Systems Review of Systems  All other systems reviewed and are negative.    Physical Exam Updated Vital Signs BP 135/78   Pulse 68   Temp 97.8 F (36.6 C) (Oral)   Resp 14   SpO2 98%   Physical Exam  Constitutional: She is  oriented to person, place, and time. She appears well-developed and well-nourished. No distress.  HENT:  Head: Normocephalic and atraumatic.  Eyes: EOM are normal.  Neck: Normal range of motion.  Cardiovascular: Normal rate, regular rhythm and normal heart sounds.  Pulmonary/Chest: Effort normal and breath sounds normal.  Abdominal: Soft. She exhibits no distension. There is no tenderness.  Musculoskeletal: Normal range of motion.  Neurological: She is alert and oriented to person, place, and time.  Skin: Skin is warm and dry.  Psychiatric: She has a  normal mood and affect. Judgment normal.  Nursing note and vitals reviewed.    ED Treatments / Results  Labs (all labs ordered are listed, but only abnormal results are displayed) Labs Reviewed  BASIC METABOLIC PANEL - Abnormal; Notable for the following components:      Result Value   Glucose, Bld 220 (*)    BUN 29 (*)    Creatinine, Ser 1.61 (*)    GFR calc non Af Amer 32 (*)    GFR calc Af Amer 37 (*)    All other components within normal limits  CBC - Abnormal; Notable for the following components:   RBC 3.78 (*)    Hemoglobin 9.7 (*)    HCT 32.1 (*)    MCH 25.7 (*)    RDW 16.6 (*)    All other components within normal limits  I-STAT TROPONIN, ED  I-STAT TROPONIN, ED    EKG  EKG Interpretation  Date/Time:  Sunday May 02 2017 02:28:07 EST Ventricular Rate:  83 PR Interval:    QRS Duration: 87 QT Interval:  363 QTC Calculation: 427 R Axis:   -31 Text Interpretation:  Sinus rhythm Inferior infarct, old No significant change was found Confirmed by Jola Schmidt [id number]) on 05/02/2017 4:59:29 AM       Radiology Dg Chest 2 View  Result Date: 05/02/2017 CLINICAL DATA:  Right-sided chest pain. EXAM: CHEST  2 VIEW COMPARISON:  None. FINDINGS: The heart is upper normal in size. Mediastinal contours are normal. Subsegmental atelectasis at the right lung base. No pulmonary edema. No consolidation, pleural effusion,  or pneumothorax. No acute osseous abnormalities are seen. IMPRESSION: Upper normal heart size with right basilar atelectasis. Electronically Signed   By: Jeb Levering M.D.   On: 05/02/2017 03:36    Procedures Procedures (including critical care time)  Medications Ordered in ED Medications  acetaminophen (TYLENOL) tablet 1,000 mg (1,000 mg Oral Given 05/02/17 0543)  alum & mag hydroxide-simeth (MAALOX/MYLANTA) 200-200-20 MG/5ML suspension 30 mL (30 mLs Oral Given 05/02/17 0543)     Initial Impression / Assessment and Plan / ED Course  I have reviewed the triage vital signs and the nursing notes.  Pertinent labs & imaging results that were available during my care of the patient were reviewed by me and considered in my medical decision making (see chart for details).     Doubt ACS.  Doubt PE.  Doubt dissection.  Feels better after Maalox here in the emergency department.  Recommended over-the-counter Prilosec.  EKG without ischemic changes.  Troponin negative x2.  Chest x-ray without significant abnormality.  Mediastinum normal  Final Clinical Impressions(s) / ED Diagnoses   Final diagnoses:  Precordial pain    ED Discharge Orders    None       Jola Schmidt, MD 05/02/17 [phone]

## 2017-05-02 NOTE — ED Notes (Signed)
Patient denies pain and is resting comfortably.  

## 2017-05-02 NOTE — ED Notes (Signed)
Pt ambulated to RR with steady gait, did not want this RN to stay with her in RR.

## 2017-05-02 NOTE — ED Triage Notes (Signed)
Pt presents to ER from home with [facility] for CP that is right sided, feels like "pulling" that is worse with movement/palpation; denies heavy lifting; pt initially thought indigestion because she could not lay flat to sleep; pt given 324mg  ASA, 1 SL NTG, and 4mg  morphine by EMS enroute; pain reduced from 9/10 to 3/10; pts CBG was 346 and gave herself insulin before transporting to hospital, now 277

## 2017-05-06 DIAGNOSIS — D631 Anemia in chronic kidney disease: Secondary | ICD-10-CM | POA: Diagnosis not present

## 2017-05-06 DIAGNOSIS — E785 Hyperlipidemia, unspecified: Secondary | ICD-10-CM | POA: Diagnosis not present

## 2017-05-06 DIAGNOSIS — E1140 Type 2 diabetes mellitus with diabetic neuropathy, unspecified: Secondary | ICD-10-CM | POA: Diagnosis not present

## 2017-05-06 DIAGNOSIS — I129 Hypertensive chronic kidney disease with stage 1 through stage 4 chronic kidney disease, or unspecified chronic kidney disease: Secondary | ICD-10-CM | POA: Diagnosis not present

## 2017-05-06 DIAGNOSIS — E1122 Type 2 diabetes mellitus with diabetic chronic kidney disease: Secondary | ICD-10-CM | POA: Diagnosis not present

## 2017-05-06 DIAGNOSIS — M1990 Unspecified osteoarthritis, unspecified site: Secondary | ICD-10-CM | POA: Diagnosis not present

## 2017-05-06 DIAGNOSIS — M109 Gout, unspecified: Secondary | ICD-10-CM | POA: Diagnosis not present

## 2017-05-06 DIAGNOSIS — Z23 Encounter for immunization: Secondary | ICD-10-CM | POA: Diagnosis not present

## 2017-05-06 DIAGNOSIS — N183 Chronic kidney disease, stage 3 (moderate): Secondary | ICD-10-CM | POA: Diagnosis not present

## 2017-05-07 ENCOUNTER — Other Ambulatory Visit: Payer: Self-pay

## 2017-05-07 ENCOUNTER — Encounter: Payer: Self-pay | Admitting: Family Medicine

## 2017-05-07 ENCOUNTER — Ambulatory Visit (INDEPENDENT_AMBULATORY_CARE_PROVIDER_SITE_OTHER): Payer: Medicare HMO | Admitting: Family Medicine

## 2017-05-07 VITALS — BP 132/70 | HR 81 | Temp 98.6°F | Wt 254.0 lb

## 2017-05-07 DIAGNOSIS — N183 Chronic kidney disease, stage 3 unspecified: Secondary | ICD-10-CM

## 2017-05-07 DIAGNOSIS — E1140 Type 2 diabetes mellitus with diabetic neuropathy, unspecified: Secondary | ICD-10-CM | POA: Diagnosis not present

## 2017-05-07 DIAGNOSIS — Z1231 Encounter for screening mammogram for malignant neoplasm of breast: Secondary | ICD-10-CM

## 2017-05-07 DIAGNOSIS — Z794 Long term (current) use of insulin: Secondary | ICD-10-CM | POA: Diagnosis not present

## 2017-05-07 DIAGNOSIS — Z1239 Encounter for other screening for malignant neoplasm of breast: Secondary | ICD-10-CM

## 2017-05-07 DIAGNOSIS — R079 Chest pain, unspecified: Secondary | ICD-10-CM

## 2017-05-07 LAB — POCT GLYCOSYLATED HEMOGLOBIN (HGB A1C): Hemoglobin A1C: 8.2

## 2017-05-07 NOTE — Assessment & Plan Note (Signed)
  Following with nephro, labs pending from visit yesterday including CBC and iron studies  -continue to follow up with nephro as scheduled

## 2017-05-07 NOTE — Progress Notes (Signed)
    Subjective:    Patient ID: [name], female    DOB: [birth year], 71 y.o.   MRN: [id number]  CC: pain R side chest  Was in ED Saturday for right sided chest pain, work up negative. Was told it was GERD and to get Prilosec, she did not get this. The pain was heavy and radiated around her right side to her back. Not related to food. Sometimes positional changes make it worse. She can't think of anything that makes it better except staying still. Tried drinking hot tea, made it worse. In ambulance nitroglycerin did not improve the pain. Denies shortness of breath, diaphoresis, nausea, vomiting.  Had a bout of gout the week before and took colchicine. This happened because of her poor diet - "went on a binge with food"  Also concerned about a knot in her neck that she felt. It doesn't hurt but she's worried about what it could be.  DM- Fasting sugars 77. Appetite is poor. Eats 1 big meal at night,   Smoking status reviewed- former smoker  Review of Systems- see HPI   Objective:  BP 132/70   Pulse 81   Temp 98.6 F (37 C) (Oral)   Wt 254 lb (115.2 kg)   SpO2 98%   BMI 44.99 kg/m  Vitals and nursing note reviewed  General: well nourished, in no acute distress HEENT: normocephalic, TM's visualized bilaterally, no scleral icterus or conjunctival pallor, no nasal discharge, moist mucous membranes, good dentition without erythema or discharge noted in posterior oropharynx Neck: supple, non-tender, without lymphadenopathy Cardiac: RRR, clear S1 and S2, no murmurs, rubs, or gallops Respiratory: clear to auscultation bilaterally, no increased work of breathing Abdomen: soft, nontender, nondistended, no masses or organomegaly. Bowel sounds present Extremities: no edema or cyanosis. Warm, well perfused. 2+ radial and PT pulses bilaterally Skin: warm and dry, no rashes noted Neuro: alert and oriented, no focal deficits   Assessment & Plan:    Right-sided chest pain  Likely MSK  in nature. Patient tender to palpation over chest wall and back.   -advised heating pad, tylenol as needed -encouraged exercise -advised patient to try acid reducing medication as reflux may be blurring picture -reassurance provided  -follow up as needed   CKD (chronic kidney disease), stage III  Following with nephro, labs pending from visit yesterday including CBC and iron studies  -continue to follow up with nephro as scheduled  Type 2 diabetes mellitus with diabetic neuropathy, with long-term current use of insulin ([facility])  Uncontrolled. a1c 8.2 today.   -continue current regimen -appointment set up to see pharmacist 1/7 to discuss medication changes -follow up 3 months  "Knot in neck" Upon examination patient is feeling the head of her collarbone. Reassurance provided.  Health maintenance- order for diagnostic mammo placed as patient has right sided breast tenderness   Return in about 4 weeks (around 06/04/2017).   Lucila Maine, DO Family Medicine Resident PGY-2

## 2017-05-07 NOTE — Patient Instructions (Signed)
It was great seeing you today!  Please take tylenol and use your heating pad for your back pain. Please try to increase exercise as this will help you feel better overall and help with diabetes and blood pressure.   Please follow up with Dr. Valentina Lucks on 1/7 at 11 am to discuss diabetes medications more.  If you have questions or concerns please do not hesitate to call at [phone].  Michelle Maine, DO PGY-2, [place] 05/07/2017 12:50 PM   Exercising to Stay Healthy Exercising regularly is important. It has many health benefits, such as:  Improving your overall fitness, flexibility, and endurance.  Increasing your bone density.  Helping with weight control.  Decreasing your body fat.  Increasing your muscle strength.  Reducing stress and tension.  Improving your overall health.  In order to become healthy and stay healthy, it is recommended that you do moderate-intensity and vigorous-intensity exercise. You can tell that you are exercising at a moderate intensity if you have a higher heart rate and faster breathing, but you are still able to hold a conversation. You can tell that you are exercising at a vigorous intensity if you are breathing much harder and faster and cannot hold a conversation while exercising. How often should I exercise? Choose an activity that you enjoy and set realistic goals. Your health care provider can help you to make an activity plan that works for you. Exercise regularly as directed by your health care provider. This may include:  Doing resistance training twice each week, such as: ? Push-ups. ? Sit-ups. ? Lifting weights. ? Using resistance bands.  Doing a given intensity of exercise for a given amount of time. Choose from these options: ? 150 minutes of moderate-intensity exercise every week. ? 75 minutes of vigorous-intensity exercise every week. ? A mix of moderate-intensity and vigorous-intensity exercise every  week.  Children, pregnant women, people who are out of shape, people who are overweight, and older adults may need to consult a health care provider for individual recommendations. If you have any sort of medical condition, be sure to consult your health care provider before starting a new exercise program. What are some exercise ideas? Some moderate-intensity exercise ideas include:  Walking at a rate of 1 mile in 15 minutes.  Biking.  Hiking.  Golfing.  Dancing.  Some vigorous-intensity exercise ideas include:  Walking at a rate of at least 4.5 miles per hour.  Jogging or running at a rate of 5 miles per hour.  Biking at a rate of at least 10 miles per hour.  Lap swimming.  Roller-skating or in-line skating.  Cross-country skiing.  Vigorous competitive sports, such as football, basketball, and soccer.  Jumping rope.  Aerobic dancing.  What are some everyday activities that can help me to get exercise?  [place] work, such as: ? Pushing a Conservation officer, nature. ? Raking and bagging leaves.  Washing and waxing your car.  Pushing a stroller.  Shoveling snow.  Gardening.  Washing windows or floors. How can I be more active in my day-to-day activities?  Use the stairs instead of the elevator.  Take a walk during your lunch break.  If you drive, park your car farther away from work or school.  If you take public transportation, get off one stop early and walk the rest of the way.  Make all of your phone calls while standing up and walking around.  Get up, stretch, and walk around every 30 minutes throughout the  day. What guidelines should I follow while exercising?  Do not exercise so much that you hurt yourself, feel dizzy, or get very short of breath.  Consult your health care provider before starting a new exercise program.  Wear comfortable clothes and shoes with good support.  Drink plenty of water while you exercise to prevent dehydration or heat stroke.  Body water is lost during exercise and must be replaced.  Work out until you breathe faster and your heart beats faster. This information is not intended to replace advice given to you by your health care provider. Make sure you discuss any questions you have with your health care provider. Document Released: 06/06/2010 Document Revised: 10/10/2015 Document Reviewed: 10/05/2013 Elsevier Interactive Patient Education  [employer].

## 2017-05-07 NOTE — Assessment & Plan Note (Signed)
  Uncontrolled. a1c 8.2 today.   -continue current regimen -appointment set up to see pharmacist 1/7 to discuss medication changes -follow up 3 months

## 2017-05-07 NOTE — Assessment & Plan Note (Signed)
  Likely MSK in nature. Patient tender to palpation over chest wall and back.   -advised heating pad, tylenol as needed -encouraged exercise -advised patient to try acid reducing medication as reflux may be blurring picture -reassurance provided  -follow up as needed

## 2017-05-15 ENCOUNTER — Other Ambulatory Visit: Payer: Self-pay | Admitting: Family Medicine

## 2017-05-15 DIAGNOSIS — N644 Mastodynia: Secondary | ICD-10-CM

## 2017-05-24 ENCOUNTER — Ambulatory Visit: Payer: Medicare HMO | Admitting: Pharmacist

## 2017-05-31 ENCOUNTER — Other Ambulatory Visit: Payer: Medicare HMO

## 2017-06-08 ENCOUNTER — Ambulatory Visit
Admission: RE | Admit: 2017-06-08 | Discharge: 2017-06-08 | Disposition: A | Discharge status: Home / Self Care | Payer: Medicare HMO | Source: Ambulatory Visit | Attending: Family Medicine | Admitting: Family Medicine

## 2017-06-08 DIAGNOSIS — R928 Other abnormal and inconclusive findings on diagnostic imaging of breast: Secondary | ICD-10-CM | POA: Diagnosis not present

## 2017-06-08 DIAGNOSIS — N644 Mastodynia: Secondary | ICD-10-CM

## 2017-06-08 DIAGNOSIS — R6889 Other general symptoms and signs: Secondary | ICD-10-CM | POA: Diagnosis not present

## 2017-06-23 ENCOUNTER — Other Ambulatory Visit: Payer: Self-pay | Admitting: *Deleted

## 2017-06-24 MED ORDER — TRAMADOL HCL 50 MG PO TABS: 50.0000 mg | ORAL_TABLET | Freq: Three times a day (TID) | ORAL | 0 refills | Status: DC | PRN

## 2017-06-24 NOTE — Telephone Encounter (Signed)
Please resend Tramadol to [facility]. Danley Danker, RN [facility] RN)

## 2017-06-24 NOTE — Addendum Note (Signed)
Addended by: Esau Grew on: 06/24/2017 11:49 AM   Modules accepted: Orders

## 2017-06-25 ENCOUNTER — Other Ambulatory Visit: Payer: Self-pay

## 2017-07-15 ENCOUNTER — Encounter: Payer: Self-pay | Admitting: Pharmacist

## 2017-07-15 ENCOUNTER — Ambulatory Visit (INDEPENDENT_AMBULATORY_CARE_PROVIDER_SITE_OTHER): Payer: Medicare HMO | Admitting: Pharmacist

## 2017-07-15 DIAGNOSIS — Z794 Long term (current) use of insulin: Secondary | ICD-10-CM | POA: Diagnosis not present

## 2017-07-15 DIAGNOSIS — I10 Essential (primary) hypertension: Secondary | ICD-10-CM

## 2017-07-15 DIAGNOSIS — E1140 Type 2 diabetes mellitus with diabetic neuropathy, unspecified: Secondary | ICD-10-CM | POA: Diagnosis not present

## 2017-07-15 DIAGNOSIS — R6889 Other general symptoms and signs: Secondary | ICD-10-CM | POA: Diagnosis not present

## 2017-07-15 DIAGNOSIS — E782 Mixed hyperlipidemia: Secondary | ICD-10-CM

## 2017-07-15 MED ORDER — LANTUS SOLOSTAR 100 UNIT/ML ~~LOC~~ SOPN: 40.0000 [IU] | PEN_INJECTOR | Freq: Every day | SUBCUTANEOUS | Status: DC

## 2017-07-15 NOTE — Progress Notes (Signed)
    S:     Chief Complaint  Patient presents with  . Medication Management    T2DM    Patient arrives in good spirits and ambulating without assistance. Presents for diabetes evaluation, education, and management at the request of PCP Dr. Vanetta Shawl on 12/18. Patient reports skipping lantus dose three nights per week and often reducing the evening dose if she does take it due to low fasting AM blood sugar readings 55-70.   Patient reports eating two meals daily of similar portion with snaking in between. She endorses being symptomatic several mornings throughout the week when her blood sugar is <90, for which she reports drinking fruit juice or eating cereal to bring sugars back to normal range. She reports tolerating her Victoza well and denies nausea and vomiting. Today in clinic, patient also reports spontaneous tremor in both hands especially right hand and impacts grasping and writing. She says this seems to worsen when she drinks tea.   Patient reports adherence with medications.  Current diabetes medications include:Victoza 1.8mg  daily, lantus 42 units daily  Current hypertension medications include: Chlorthalidone 25mg , losartan 100mg  and metoprolol 25mg  daily  Patient reports hypoglycemic events. Reports reducing sugars with juice and avoiding basal insulin with morning fasting sugars <100.  Patient reported dietary habits: Eats 2 meals/day Lunch: [employer] fried chicken salad Snack: veggie sticks, potato chips Drinks:water and 2-3 cups hot sweetened tea  Patient reported exercise habits: Patient states goal is to walk 15 minutes twice a week.   O:  Physical Exam  Constitutional: She appears well-developed and well-nourished.     Review of Systems  Eyes: Positive for blurred vision.  Musculoskeletal: Positive for back pain.     Lab Results  Component Value Date   HGBA1C 8.2 05/07/2017   Vitals:   07/15/17 1032  BP: 128/78  Pulse: 84  SpO2: 97%   CBGs: Fasting  70-130 Excursions to 50s Postprandial 150-220s Excursions to low 300s  7-day average 148 14-day average 137    A/P: Diabetes longstanding improved evidenced by A1c 8% and improved BG, but multiple hypoglycemic events noted with fasting blood glucose. Patien is able to verbalize appropriate hypoglycemia management plan. Patient reports adherence with medication. Control is suboptimal due to insulin resistance, dietary indiscretions, sedentary lifestyle, and multiple hypoglycemic events.  Reduced lantus dose from 42units BID to 40 units QAM  to be titrated by 1 units daily with AM fasting CBG above 130. Continue victoza 1.8 mg daily.Encouraged patient with exercise goal of walking 15-minutes twice weekly to help with weight loss and better glucose control. Will have her return to pharmacy clinic in 4 weeks for follow-up. Next A1C anticipated March 2019.    ASCVD risk greater than 7.5%. Continued Aspirin 81 mg and Continued rosuvastatin 40 mg.   Hypertension longstanding currently controlled evidenced by blood pressure reading <130/80 mmHg . Patient reports adherence with medication. Control is optimal at this time and no changes will be made to her antihypertensive medication regimen.  Given complaint of tremor - potential consideration for titration of beta blocker (possibly switch to carvedilol) at future visit.   Written patient instructions provided.  Total time in face to face counseling 30 minutes.   Follow up in Pharmacist Clinic Visit in 4 weeks.   Patient seen with Onnie Boer, PharmD Candidate, Leroy Libman, PharmD, and Deirdre Pippins, PGY2 Pharmacy Resident, PharmD, BCPS.

## 2017-07-15 NOTE — Patient Instructions (Addendum)
It was great seeing you today!  Take your lantus 40 units in the morning ONCE A DAY. Increase your lantus by ONE (1) unit each day if your fasting sugars are above 130.  Congratulations on the weight loss! Keep it up and remember to walk 15 minutes twice a week.  Follow up with Rx clinic in 4 weeks.

## 2017-07-15 NOTE — Assessment & Plan Note (Signed)
ASCVD risk greater than 7.5%. Continued Aspirin 81 mg and Continued rosuvastatin 40 mg.

## 2017-07-15 NOTE — Assessment & Plan Note (Signed)
Hypertension longstanding currently controlled evidenced by blood pressure reading <130/80 mmHg . Patient reports adherence with medication. Control is optimal at this time and no changes will be made to her antihypertensive medication regimen.  Given complaint of tremor - potential consideration for titration of beta blocker (possibly switch to carvedilol) at future visit.

## 2017-07-15 NOTE — Assessment & Plan Note (Signed)
Diabetes longstanding improved evidenced by A1c 8% and improved BG, but multiple hypoglycemic events noted with fasting blood glucose. Patien is able to verbalize appropriate hypoglycemia management plan. Patient reports adherence with medication. Control is suboptimal due to insulin resistance, dietary indiscretions, sedentary lifestyle, and multiple hypoglycemic events.  Reduced lantus dose from 42units BID to 40 units QAM  to be titrated by 1 units daily with AM fasting CBG above 130. Continue victoza 1.8 mg daily.Encouraged patient with exercise goal of walking 15-minutes twice weekly to help with weight loss and better glucose control. Will have her return to pharmacy clinic in 4 weeks for follow-up. Next A1C anticipated March 2019.

## 2017-07-20 DIAGNOSIS — R6889 Other general symptoms and signs: Secondary | ICD-10-CM | POA: Diagnosis not present

## 2017-07-20 DIAGNOSIS — H5213 Myopia, bilateral: Secondary | ICD-10-CM | POA: Diagnosis not present

## 2017-07-20 DIAGNOSIS — E083292 Diabetes mellitus due to underlying condition with mild nonproliferative diabetic retinopathy without macular edema, left eye: Secondary | ICD-10-CM | POA: Diagnosis not present

## 2017-07-26 ENCOUNTER — Other Ambulatory Visit: Payer: Self-pay | Admitting: Family Medicine

## 2017-07-29 ENCOUNTER — Encounter: Payer: Self-pay | Admitting: Family Medicine

## 2017-07-29 ENCOUNTER — Other Ambulatory Visit: Payer: Self-pay

## 2017-07-29 ENCOUNTER — Ambulatory Visit (INDEPENDENT_AMBULATORY_CARE_PROVIDER_SITE_OTHER): Payer: Medicare HMO | Admitting: Family Medicine

## 2017-07-29 VITALS — BP 138/84 | HR 87 | Temp 98.2°F | Ht 63.0 in | Wt 252.0 lb

## 2017-07-29 DIAGNOSIS — Z1211 Encounter for screening for malignant neoplasm of colon: Secondary | ICD-10-CM | POA: Diagnosis not present

## 2017-07-29 DIAGNOSIS — E559 Vitamin D deficiency, unspecified: Secondary | ICD-10-CM

## 2017-07-29 DIAGNOSIS — R6889 Other general symptoms and signs: Secondary | ICD-10-CM | POA: Diagnosis not present

## 2017-07-29 DIAGNOSIS — N183 Chronic kidney disease, stage 3 unspecified: Secondary | ICD-10-CM

## 2017-07-29 DIAGNOSIS — E1140 Type 2 diabetes mellitus with diabetic neuropathy, unspecified: Secondary | ICD-10-CM | POA: Diagnosis not present

## 2017-07-29 DIAGNOSIS — E782 Mixed hyperlipidemia: Secondary | ICD-10-CM

## 2017-07-29 DIAGNOSIS — Z794 Long term (current) use of insulin: Secondary | ICD-10-CM | POA: Diagnosis not present

## 2017-07-29 DIAGNOSIS — Z1159 Encounter for screening for other viral diseases: Secondary | ICD-10-CM

## 2017-07-29 DIAGNOSIS — D509 Iron deficiency anemia, unspecified: Secondary | ICD-10-CM | POA: Diagnosis not present

## 2017-07-29 LAB — POCT GLYCOSYLATED HEMOGLOBIN (HGB A1C): Hemoglobin A1C: 7.6

## 2017-07-29 NOTE — Patient Instructions (Signed)
It was great seeing you today!  Keep up the good work with your sugars. Per Dr. Graylin Shiver instructions, increase Lantus by 1 unit if fasting sugars > 130 You see him in a couple of weeks to follow up.  Please try to work on adding in exercise as well. I think this will help with energy and your back pains.  I have referred you to GI for colonoscopy. You will receive a call from our office to schedule this appointment. If you do not hear from our office in 1-2 weeks please call us to check on the status of your referral at [phone].   If you have questions or concerns please do not hesitate to call at [phone].  Michelle Maine, DO PGY-2, [facility] 07/29/2017 1:58 PM     Back Pain, Adult Many adults have back pain from time to time. Common causes of back pain include:  A strained muscle or ligament.  Wear and tear (degeneration) of the spinal disks.  Arthritis.  A hit to the back.  Back pain can be short-lived (acute) or last a long time (chronic). A physical exam, lab tests, and imaging studies may be done to find the cause of your pain. Follow these instructions at home: Managing pain and stiffness  Take over-the-counter and prescription medicines only as told by your health care provider.  If directed, apply heat to the affected area as often as told by your health care provider. Use the heat source that your health care provider recommends, such as a moist heat pack or a heating pad. ? Place a towel between your skin and the heat source. ? Leave the heat on for 20-30 minutes. ? Remove the heat if your skin turns bright red. This is especially important if you are unable to feel pain, heat, or cold. You have a greater risk of getting burned.  If directed, apply ice to the injured area: ? Put ice in a plastic bag. ? Place a towel between your skin and the bag. ? Leave the ice on for 20 minutes, 2-3 times a day for the first 2-3  days. Activity  Do not stay in bed. Resting more than 1-2 days can delay your recovery.  Take short walks on even surfaces as soon as you are able. Try to increase the length of time you walk each day.  Do not sit, drive, or stand in one place for more than 30 minutes at a time. Sitting or standing for long periods of time can put stress on your back.  Use proper lifting techniques. When you bend and lift, use positions that put less stress on your back: ? [place] your knees. ? Keep the load close to your body. ? Avoid twisting.  Exercise regularly as told by your health care provider. Exercising will help your back heal faster. This also helps prevent back injuries by keeping muscles strong and flexible.  Your health care provider may recommend that you see a physical therapist. This person can help you come up with a safe exercise program. Do any exercises as told by your physical therapist. Lifestyle  Maintain a healthy weight. Extra weight puts stress on your back and makes it difficult to have good posture.  Avoid activities or situations that make you feel anxious or stressed. Learn ways to manage anxiety and stress. One way to manage stress is through exercise. Stress and anxiety increase muscle tension and can make back pain worse. General instructions  Sleep on a firm mattress in a comfortable position. Try lying on your side with your knees slightly bent. If you lie on your back, put a pillow under your knees.  Follow your treatment plan as told by your health care provider. This may include: ? Cognitive or behavioral therapy. ? Acupuncture or massage therapy. ? Meditation or yoga. Contact a health care provider if:  You have pain that is not relieved with rest or medicine.  You have increasing pain going down into your legs or buttocks.  Your pain does not improve in 2 weeks.  You have pain at night.  You lose weight.  You have a fever or chills. Get help right away  if:  You develop new bowel or bladder control problems.  You have unusual weakness or numbness in your arms or legs.  You develop nausea or vomiting.  You develop abdominal pain.  You feel faint. Summary  Many adults have back pain from time to time. A physical exam, lab tests, and imaging studies may be done to find the cause of your pain.  Use proper lifting techniques. When you bend and lift, use positions that put less stress on your back.  Take over-the-counter and prescription medicines and apply heat or ice as directed by your health care provider. This information is not intended to replace advice given to you by your health care provider. Make sure you discuss any questions you have with your health care provider. Document Released: 05/04/2005 Document Revised: 06/08/2016 Document Reviewed: 06/08/2016 Elsevier Interactive Patient Education  [employer].

## 2017-07-29 NOTE — Progress Notes (Signed)
    Subjective:    Patient ID: Michelle Abbott, female    DOB: 10/27/1945, 72 y.o.   MRN: [id number]   CC: DM follow up   DM- taking 42 units lantus every night. Sugars avg around 150. They are related to her diet. She states that low sugars she was experiencing have not happened any more. She eats late meals. She is tolerating victoza. She has not incorporated walking yet. Her back pain is limiting her. Takes tramadol sparingly if pain is very bad.   HM- due for hep C screening, eye exam (saw eye doctor last week and signed ROI), tetanus, colonoscopy, dexa scan (reports she had one before moving to [place]), pneumonia vaccine  Smoking status reviewed- former smoker  Review of Systems- denies increased thirst, hunger, or urination. She endorses occasional nausea, denies vomiting. Denies CP, SOB.   Objective:  BP 138/84   Pulse 87   Temp 98.2 F (36.8 C) (Oral)   Ht 5\' 3"  (1.6 m)   Wt 252 lb (114.3 kg)   SpO2 99%   BMI 44.64 kg/m  Vitals and nursing note reviewed  General: well nourished, in no acute distress HEENT: normocephalic, no scleral icterus or conjunctival pallor, no nasal discharge, moist mucous membranes, good dentition without erythema or discharge noted in posterior oropharynx Neck: supple, non-tender, without lymphadenopathy Cardiac: RRR, clear S1 and S2, no murmurs, rubs, or gallops Respiratory: clear to auscultation bilaterally, no increased work of breathing Extremities: no edema or cyanosis. Skin: warm and dry, no rashes noted Neuro: alert and oriented, no focal deficits  Assessment & Plan:    1. Type 2 diabetes mellitus with diabetic neuropathy, with long-term current use of insulin (HCC) A1C improved today which is encouraging. For now continue same regimen. Discussed titrating Lantus up by 1 unit if fasting sugars >130. She will follow up with pharmacy clinic at end of month to assess for any further changes. No further hypoglycemic events.  - HgB A1c  2.  Iron deficiency anemia, unspecified iron deficiency anemia type Check CBC today. She follows with nephro who had discussed possibly needing epo injection. She takes daily iron.  - CBC  3. CKD (chronic kidney disease), stage III (HCC) Check kidney function today. Follow up with nephro as scheduled.  - Basic metabolic panel  4. Vitamin D deficiency Takes daily vitamin D supplement. Will check level today.  - VITAMIN D 25 Hydroxy (Vit-D Deficiency, Fractures)  5. Screening for viral disease Due for Hep C check. - Hepatitis C antibody  6. Special screening for malignant neoplasms, colon Due for colonoscopy. She has had 2 normal in the past. She is willing to get this done. Will refer to GI.  - Ambulatory referral to Gastroenterology  7. Mixed hyperlipidemia On crestor 40. Will check lipid panel today.  - Lipid panel   Return in about 3 months (around 10/29/2017).   Lucila Maine, DO Family Medicine Resident PGY-2

## 2017-07-30 ENCOUNTER — Encounter: Payer: Self-pay | Admitting: Family Medicine

## 2017-07-30 LAB — CBC
HEMOGLOBIN: 10 g/dL — AB (ref 11.1–15.9)
Hematocrit: 33 % — ABNORMAL LOW (ref 34.0–46.6)
MCH: 26 pg — AB (ref 26.6–33.0)
MCHC: 30.3 g/dL — ABNORMAL LOW (ref 31.5–35.7)
MCV: 86 fL (ref 79–97)
PLATELETS: 268 10*3/uL (ref 150–379)
RBC: 3.85 x10E6/uL (ref 3.77–5.28)
RDW: 17.4 % — ABNORMAL HIGH (ref 12.3–15.4)
WBC: 5.3 10*3/uL (ref 3.4–10.8)

## 2017-07-30 LAB — HEPATITIS C ANTIBODY: Hep C Virus Ab: <0.1 s/co ratio (ref 0.0–0.9)

## 2017-07-30 LAB — BASIC METABOLIC PANEL
BUN/Creatinine Ratio: 12 (ref 12–28)
BUN: 19 mg/dL (ref 8–27)
CALCIUM: 9.4 mg/dL (ref 8.7–10.3)
CO2: 26 mmol/L (ref 20–29)
CREATININE: 1.61 mg/dL — AB (ref 0.57–1.00)
Chloride: 106 mmol/L (ref 96–106)
GFR, EST AFRICAN AMERICAN: 37 mL/min/{1.73_m2} — AB (ref >59)
GFR, EST NON AFRICAN AMERICAN: 32 mL/min/{1.73_m2} — AB (ref >59)
Glucose: 171 mg/dL — ABNORMAL HIGH (ref 65–99)
POTASSIUM: 4.3 mmol/L (ref 3.5–5.2)
Sodium: 146 mmol/L — ABNORMAL HIGH (ref 134–144)

## 2017-07-30 LAB — LIPID PANEL
CHOLESTEROL TOTAL: 128 mg/dL (ref 100–199)
Chol/HDL Ratio: 2.7 ratio (ref 0.0–4.4)
HDL: 48 mg/dL (ref >39)
LDL CALC: 58 mg/dL (ref 0–99)
Triglycerides: 111 mg/dL (ref 0–149)
VLDL CHOLESTEROL CAL: 22 mg/dL (ref 5–40)

## 2017-07-30 LAB — VITAMIN D 25 HYDROXY (VIT D DEFICIENCY, FRACTURES): Vit D, 25-Hydroxy: 38.1 ng/mL (ref 30.0–100.0)

## 2017-08-12 ENCOUNTER — Ambulatory Visit (INDEPENDENT_AMBULATORY_CARE_PROVIDER_SITE_OTHER): Payer: Medicare HMO | Admitting: Pharmacist

## 2017-08-12 ENCOUNTER — Encounter: Payer: Self-pay | Admitting: Pharmacist

## 2017-08-12 DIAGNOSIS — E1140 Type 2 diabetes mellitus with diabetic neuropathy, unspecified: Secondary | ICD-10-CM | POA: Diagnosis not present

## 2017-08-12 DIAGNOSIS — Z794 Long term (current) use of insulin: Secondary | ICD-10-CM

## 2017-08-12 DIAGNOSIS — R6889 Other general symptoms and signs: Secondary | ICD-10-CM | POA: Diagnosis not present

## 2017-08-12 NOTE — Patient Instructions (Signed)
It was good to see you today!   1. Continue to take your Victoza as you have been.  2. Take your Lantus in the morning. Take 39 units every day. Decrease to 38 units per day if your fasting blood sugar is around 100.  3. If your blood sugar is below 60, do not take your insulin.  4. If you have any concerns about your blood sugar or insulin doses, please call the pharmacy clinic.   You are doing excellent with your therapy! Keep up the good work.   Come back to see Dr. Valentina Lucks in 2 months.

## 2017-08-12 NOTE — Progress Notes (Signed)
    S:   Patient arrives in high spirits, self-ambulating.  Presents for diabetes evaluation, education, and management at the request of Dr. Vanetta Shawl. Patient was referred on 05/04/17.  Patient reports she feels her blood sugars have been doing well. Patient brings meter with her for review. Patient reports that she often delays insulin dose to evening when her FBGs are <130 as she is under the impression she doesn't need it until CBGs are higher. On days she delays insulin dose and takes in evening, she sometimes will take the next morning dose on time (<12 hours from last insulin dose). Patient reports she has lost some weight and feels "ok" with her weight loss, but would like to weigh about 240lbs in 2 months. Paitent reports decreased appetite and prefers to snack. Patient questioned use of her rosuvastatin since she was told her cholesterol levels were "normal."   Patient reports adherence with medications.  Current diabetes medications include: Victoza 1.8mg  daily, Lantus 41-42 units daily (varying times, intends to give in mornings unless CBGs "too low") Current hypertension medications include: Chlorthalidone 25mg , losartan 100mg  and metoprolol 25mg  daily  Patient reports hypoglycemic events per the glucometer x1 to 69  Patient reported dietary habits:  Dinner yesterday:corn, broccoli, carrots and noodles with chicken (frozen meal), sliced peaches, pear and orange Drinks: water and hot tea   Denies n/v or GI upset.  O:   Lab Results  Component Value Date   HGBA1C 7.6 07/29/2017   Vitals:   08/12/17 1332  BP: 132/80  Pulse: 86  SpO2: 96%    Per home glucometer: range is 90s- high 200s. Only 1 event of hypoglycemia to 69. Several readings in 70s.  A/P: Diabetes longstanding diagnosed currently under better control with medications and some dietary changes. Patient reports hypoglycemic events and is able to verbalize appropriate hypoglycemia management plan. Patient attempts  adherence with medication however misunderstood dosing instructions for Lantus. Control is suboptimal due to erratic dosing schedule of lantus + insulin stacking when giving doses too close together, dietary indiscretion, sedentary lifestyle. -Decrease Lantus to 39 units daily, allow self-decrease by 1-2 units for FBG <100. Counseled on appropriate dosing schedule. -Continue Victoza 1.8 mg daily  -Next A1C anticipated June 2019.    ASCVD risk greater than 7.5%. Continued Aspirin 81 mg and Continued rosuvastatin 40 mg. Counseled on importance of adherence to rosuvastatin for CV protection.   Hypertension longstanding  currently controlled with medications.  Patient reports adherence with medication. Continue current medications.  Written patient instructions provided.  Total time in face to face counseling 20 minutes.   Follow up in Pharmacist Clinic Visit in 2 months. Patient seen with Hildred Alamin, PharmD Candidate and Jalene Mullet, PharmD, PGY1 Resident and Deirdre Pippins, PGY2 Pharmacy Resident, PharmD, BCPS.

## 2017-08-12 NOTE — Assessment & Plan Note (Signed)
Diabetes longstanding diagnosed currently under better control with medications and some dietary changes. Patient reports hypoglycemic events and is able to verbalize appropriate hypoglycemia management plan. Patient attempts adherence with medication however misunderstood dosing instructions for Lantus. Control is suboptimal due to erratic dosing schedule of lantus + insulin stacking when giving doses too close together, dietary indiscretion, sedentary lifestyle. -Decrease Lantus to 39 units daily, allow self-decrease by 1-2 units for FBG <100. Counseled on appropriate dosing schedule. -Continue Victoza 1.8 mg daily

## 2017-08-23 ENCOUNTER — Other Ambulatory Visit: Payer: Self-pay | Admitting: Obstetrics and Gynecology

## 2017-08-23 DIAGNOSIS — R6889 Other general symptoms and signs: Secondary | ICD-10-CM | POA: Diagnosis not present

## 2017-09-13 ENCOUNTER — Other Ambulatory Visit: Payer: Self-pay | Admitting: Family Medicine

## 2017-09-21 DIAGNOSIS — Z1211 Encounter for screening for malignant neoplasm of colon: Secondary | ICD-10-CM | POA: Diagnosis not present

## 2017-09-21 DIAGNOSIS — D126 Benign neoplasm of colon, unspecified: Secondary | ICD-10-CM | POA: Diagnosis not present

## 2017-09-21 DIAGNOSIS — K5730 Diverticulosis of large intestine without perforation or abscess without bleeding: Secondary | ICD-10-CM | POA: Diagnosis not present

## 2017-09-22 ENCOUNTER — Other Ambulatory Visit: Payer: Self-pay | Admitting: Family Medicine

## 2017-09-22 DIAGNOSIS — E1140 Type 2 diabetes mellitus with diabetic neuropathy, unspecified: Secondary | ICD-10-CM

## 2017-09-22 DIAGNOSIS — Z794 Long term (current) use of insulin: Principal | ICD-10-CM

## 2017-09-24 DIAGNOSIS — D126 Benign neoplasm of colon, unspecified: Secondary | ICD-10-CM | POA: Diagnosis not present

## 2017-09-27 ENCOUNTER — Other Ambulatory Visit: Payer: Self-pay

## 2017-10-04 MED ORDER — TRAMADOL HCL 50 MG PO TABS: 50.0000 mg | ORAL_TABLET | Freq: Three times a day (TID) | ORAL | 0 refills | Status: DC | PRN

## 2017-10-04 NOTE — Telephone Encounter (Signed)
Refilled rx, paper rx at front desk for pick up.

## 2017-10-18 ENCOUNTER — Ambulatory Visit: Payer: Medicare HMO | Admitting: Pharmacist

## 2017-10-21 ENCOUNTER — Encounter: Payer: Self-pay | Admitting: Pharmacist

## 2017-10-21 ENCOUNTER — Ambulatory Visit (INDEPENDENT_AMBULATORY_CARE_PROVIDER_SITE_OTHER): Payer: Medicare Other | Admitting: Pharmacist

## 2017-10-21 DIAGNOSIS — E1140 Type 2 diabetes mellitus with diabetic neuropathy, unspecified: Secondary | ICD-10-CM

## 2017-10-21 DIAGNOSIS — Z794 Long term (current) use of insulin: Secondary | ICD-10-CM

## 2017-10-21 MED ORDER — LIRAGLUTIDE 18 MG/3ML ~~LOC~~ SOPN: 1.8000 mg | PEN_INJECTOR | Freq: Every day | SUBCUTANEOUS | 2 refills | Status: DC

## 2017-10-21 MED ORDER — BD PEN NEEDLE SHORT U/F 31G X 8 MM MISC: 3 refills | Status: DC

## 2017-10-21 MED ORDER — LANTUS SOLOSTAR 100 UNIT/ML ~~LOC~~ SOPN: 30.0000 [IU] | PEN_INJECTOR | Freq: Every day | SUBCUTANEOUS | Status: DC

## 2017-10-21 MED ORDER — INSULIN LISPRO 100 UNIT/ML (KWIKPEN): 8.0000 [IU] | PEN_INJECTOR | Freq: Every day | SUBCUTANEOUS | 1 refills | Status: DC

## 2017-10-21 NOTE — Patient Instructions (Addendum)
Good to see you!   1. Decrease your Lantus to 30 units once a day in the morning 2. Start Humalog (fast acting meal time insulin) 8 units once a day with your biggest meal (supper)  3. Continue Victoza 1.8 mg under the skin once a day.    We will talk about changing the victoza to the once a week version (Trulicity or Ozempic) next visit.   We sent your prescriptions to [facility], You may need to call [employer] and get the following information:   RX BIN number RX PCN number RX Group number ID number  Then, call [facility] and give them this information as this is your new prescription drug insurance [phone])

## 2017-10-21 NOTE — Progress Notes (Signed)
S:     Chief Complaint  Patient presents with  . Medication Management    diabetes    Patient arrives in good spirits, ambulating without assistance.  Presents for diabetes evaluation, education, and management at the request of Dr. Vanetta Shawl. Patient was referred on 05/04/18.  Patient was last seen by Primary Care Provider on 07/29/17.  Today, patient reports she is disappointed that she has gained 1 lb. Reports low appetite. Thinks her appetite has decreased further since starting Victoza but she has always skipped breakfast. States she took her meds immediately prior to her visit today <2 hours ago. She brings in her CBG meter for review today.  Insurance coverage/medication affordability: Recently changed to [employer] Dual Complete (HMO SNP)  Patient reports adherence with medications.  Current diabetes medications include: Lantus 38-39 units daily, Victoza 1.8 mg once daily Current hypertension medications include: losartan 100 mg daily, metoprolol   Patient denies hypoglycemic events.  Patient reported dietary habits: Eats 1-2 meals/day Breakfast:skips Lunch:skips or bacon and eggs with toast or cereal Dinner: bologna sandwich with an orange Snacks:ice cream (1.5C), crackers, chips Drinks:Coffee or tea with cream/2tsp sugar, minute maid juice (1cup/day)  Patient-reported exercise habits: walking daily, "to mail box"   Patient reports nocturia. Twice nightly.   Patient reports neuropathy. Patient denies visual changes. Just got eye exam and got new glasses.  Patient reports self foot exams. Denies issues.   O:  Physical Exam  Constitutional: She appears well-developed and well-nourished.    Review of Systems  Cardiovascular: Positive for leg swelling (1+ pitting edema bilaterally, worse with hot weather per patient).  Gastrointestinal: Negative for abdominal pain, nausea and vomiting.  All other systems reviewed and are negative.    Lab Results  Component  Value Date   HGBA1C 7.6 07/29/2017   Vitals:   10/21/17 1025 10/21/17 1055  BP: (!) 144/78 (!) 152/80  Pulse: 78   SpO2: 97%     Lipid Panel     Component Value Date/Time   CHOL 128 07/29/2017 1405   TRIG 111 07/29/2017 1405   HDL 48 07/29/2017 1405   CHOLHDL 2.7 07/29/2017 1405   LDLCALC 58 07/29/2017 1405    Home fasting CBG: 120s-230s  Clinical ASCVD: No  ASCVD risk factors : age 35-75 ASCVD Risk Score: 23.8%  A/P: Diabetes longstanding currently uncontrolled as evidenced by elevated fasting BG. Patient is able to verbalize appropriate hypoglycemia management plan. Patient is adherent with medication. Control is suboptimal due to suboptimally dosed insulin, pancreatic insufficiency and insulin resistance, dietary indiscretion, sedentary lifestyle. Patient's therapeutic options are limited by CKD unfortunately.  -Decreased dose of basal insulin Lantus (insulin glargine) to 30 units once daily. -Started prandial insulin Humalog (insulin lispro) at 8 units with supper. Patient educated on purpose, proper use and potential adverse effects of hypoglycemia.  Following instruction patient verbalized understanding of treatment plan.  -Continued GLP-1 Victoza (liraglutide) 1.8 mg under the skin daily. Would ideally change this medication to a once-weekly option for reduced injections/day like Ozempic or Trulicity. Patient opted to wait until next visit to do this.  -Extensively discussed pathophysiology of DM, recommended lifestyle interventions, dietary effects on glycemic control -Counseled on s/sx of and management of hypoglycemia -Next A1C anticipated next visit  ASCVD risk - primary prevention in patient with DM. Last LDL is controlled. ASCVD risk score is >20%  - high intensity statin indicated. Aspirin is indicated.  -Continued aspirin 81 mg  -Continued rosuvastatin 40 mg.   Hypertension longstanding  currently uncontrolled.  BP goal = <130/80 mmHg. Patient is adherent with  medication. Control is suboptimal due to timing of last medication doses. -No change to medications, will follow up at future visits.  -Discussed dietary modifications important for achieving BP goal.  Written patient instructions provided.  Total time in face to face counseling 30 minutes.   Follow up Pharmacist Clinic Visit in 3-4 weeks. Will follow up over the phone in 1 week.   Patient seen with Dr. Tammi Klippel, DO,  Nida Boatman, PharmD, PGY1 Pharmacy Resident and Deirdre Pippins, PharmD, BCPS, PGY2 Pharmacy Resident.

## 2017-10-21 NOTE — Assessment & Plan Note (Signed)
Diabetes longstanding currently uncontrolled as evidenced by elevated fasting BG. Patient is able to verbalize appropriate hypoglycemia management plan. Patient is adherent with medication. Control is suboptimal due to suboptimally dosed insulin, pancreatic insufficiency and insulin resistance, dietary indiscretion, sedentary lifestyle. Patient's therapeutic options are limited by CKD unfortunately.  -Decreased dose of basal insulin Lantus (insulin glargine) to 30 units once daily. -Started prandial insulin Humalog (insulin lispro) at 8 units with supper. Patient educated on purpose, proper use and potential adverse effects of hypoglycemia.  Following instruction patient verbalized understanding of treatment plan.  -Continued GLP-1 Victoza (liraglutide) 1.8 mg under the skin daily. Would ideally change this medication to a once-weekly option for reduced injections/day like Ozempic or Trulicity. Patient opted to wait until next visit to do this.  -Extensively discussed pathophysiology of DM, recommended lifestyle interventions, dietary effects on glycemic control -Counseled on s/sx of and management of hypoglycemia

## 2017-10-22 ENCOUNTER — Other Ambulatory Visit: Payer: Self-pay | Admitting: *Deleted

## 2017-10-25 ENCOUNTER — Other Ambulatory Visit: Payer: Self-pay

## 2017-10-25 MED ORDER — ROSUVASTATIN CALCIUM 40 MG PO TABS: 40.0000 mg | ORAL_TABLET | Freq: Every day | ORAL | 2 refills | Status: DC

## 2017-10-25 MED ORDER — ASPIRIN EC 81 MG PO TBEC: 81.0000 mg | DELAYED_RELEASE_TABLET | Freq: Every day | ORAL | 1 refills | Status: DC

## 2017-10-25 MED ORDER — GABAPENTIN 300 MG PO CAPS: 300.0000 mg | ORAL_CAPSULE | Freq: Every day | ORAL | 1 refills | Status: DC

## 2017-10-25 MED ORDER — CHLORTHALIDONE 25 MG PO TABS
25.0000 mg | ORAL_TABLET | Freq: Every day | ORAL | 1 refills | Status: DC
Start: 2017-10-25 — End: 2018-03-28

## 2017-10-25 MED ORDER — METOPROLOL SUCCINATE ER 25 MG PO TB24: 25.0000 mg | ORAL_TABLET | Freq: Every day | ORAL | 1 refills | Status: DC

## 2017-10-27 ENCOUNTER — Other Ambulatory Visit: Payer: Self-pay | Admitting: *Deleted

## 2017-10-27 MED ORDER — ALLOPURINOL 100 MG PO TABS: 100.0000 mg | ORAL_TABLET | Freq: Every day | ORAL | 0 refills | Status: DC

## 2017-10-27 NOTE — Telephone Encounter (Signed)
Will reduce dose to 100 mg daily due to patient's reduced renal function.

## 2017-10-28 ENCOUNTER — Telehealth: Payer: Self-pay | Admitting: Pharmacist

## 2017-10-28 DIAGNOSIS — M109 Gout, unspecified: Secondary | ICD-10-CM | POA: Diagnosis not present

## 2017-10-28 DIAGNOSIS — N183 Chronic kidney disease, stage 3 (moderate): Secondary | ICD-10-CM | POA: Diagnosis not present

## 2017-10-28 DIAGNOSIS — I129 Hypertensive chronic kidney disease with stage 1 through stage 4 chronic kidney disease, or unspecified chronic kidney disease: Secondary | ICD-10-CM | POA: Diagnosis not present

## 2017-10-28 DIAGNOSIS — E785 Hyperlipidemia, unspecified: Secondary | ICD-10-CM | POA: Diagnosis not present

## 2017-10-28 DIAGNOSIS — D631 Anemia in chronic kidney disease: Secondary | ICD-10-CM | POA: Diagnosis not present

## 2017-10-28 DIAGNOSIS — E1122 Type 2 diabetes mellitus with diabetic chronic kidney disease: Secondary | ICD-10-CM | POA: Diagnosis not present

## 2017-10-28 MED ORDER — LOSARTAN POTASSIUM 100 MG PO TABS: 100.0000 mg | ORAL_TABLET | Freq: Every day | ORAL | 3 refills | Status: DC

## 2017-10-28 NOTE — Telephone Encounter (Signed)
Called patient to f/u on understanding of plan for injectable DM therapy. No answer. Left HIPAA-compliant VM requesting she return my call.   Carlean Jews, Pharm.D., BCPS PGY2 Ambulatory Care Pharmacy Resident Phone: [phone]

## 2017-10-29 ENCOUNTER — Telehealth: Payer: Self-pay | Admitting: Pharmacist

## 2017-10-29 NOTE — Telephone Encounter (Signed)
Pt returns call and reports that she has not rec'd new insulin yet. Reports that she placed the order on Monday and anticipates the medication to be shipped to her home within 1 week. Reiterated plan with patient. Will follow up next Thursday.    Carlean Jews, Pharm.D., BCPS PGY2 Ambulatory Care Pharmacy Resident Phone: [phone]

## 2017-11-04 NOTE — Telephone Encounter (Signed)
Called pt to follow up since changing DM regimen. She reports that she has not received the medication from her mail order pharmacy yet. Called [facility] and they report that they have her prescriptions but their records indicate that the patient requested they be profiled and not filled yet. Attempted to call patient back and inform her of this however there was no answer. Left hipaa compliant VM requesting she return my call.   Carlean Jews, Pharm.D., BCPS PGY2 Ambulatory Care Pharmacy Resident Phone: [phone]

## 2017-11-05 ENCOUNTER — Telehealth: Payer: Self-pay | Admitting: Pharmacist

## 2017-11-05 NOTE — Telephone Encounter (Signed)
F/u to determine if pt received diabetes supplies. Reports pt has not received short-acting insulin, Humalog Kwikpen.

## 2017-11-10 ENCOUNTER — Telehealth: Payer: Self-pay | Admitting: *Deleted

## 2017-11-10 DIAGNOSIS — E1140 Type 2 diabetes mellitus with diabetic neuropathy, unspecified: Secondary | ICD-10-CM

## 2017-11-10 DIAGNOSIS — Z794 Long term (current) use of insulin: Principal | ICD-10-CM

## 2017-11-10 MED ORDER — LANTUS SOLOSTAR 100 UNIT/ML ~~LOC~~ SOPN: 30.0000 [IU] | PEN_INJECTOR | Freq: Every day | SUBCUTANEOUS | Status: DC

## 2017-11-10 MED ORDER — LANTUS SOLOSTAR 100 UNIT/ML ~~LOC~~ SOPN: 30.0000 [IU] | PEN_INJECTOR | Freq: Every day | SUBCUTANEOUS | 3 refills | Status: DC

## 2017-11-10 NOTE — Telephone Encounter (Signed)
[facility] called to check directions.  They were sent needles with instructions to inject insulin BID and Victoza once daily. But they only received Rx for humolog as the lantus was selected as no print.  Resent lantus electronically. Melanie Pellot, Salome Spotted, CMA

## 2017-11-11 NOTE — Telephone Encounter (Signed)
Thanks Jessica!! :) 

## 2017-12-23 ENCOUNTER — Encounter: Payer: Self-pay | Admitting: Family Medicine

## 2017-12-23 ENCOUNTER — Ambulatory Visit (INDEPENDENT_AMBULATORY_CARE_PROVIDER_SITE_OTHER): Payer: Medicare Other | Admitting: Family Medicine

## 2017-12-23 ENCOUNTER — Other Ambulatory Visit: Payer: Self-pay

## 2017-12-23 VITALS — BP 134/72 | HR 74 | Temp 97.7°F | Ht 63.0 in | Wt 249.0 lb

## 2017-12-23 DIAGNOSIS — Z23 Encounter for immunization: Secondary | ICD-10-CM | POA: Diagnosis not present

## 2017-12-23 DIAGNOSIS — M1A0710 Idiopathic chronic gout, right ankle and foot, without tophus (tophi): Secondary | ICD-10-CM

## 2017-12-23 DIAGNOSIS — Z794 Long term (current) use of insulin: Secondary | ICD-10-CM | POA: Diagnosis not present

## 2017-12-23 DIAGNOSIS — E1140 Type 2 diabetes mellitus with diabetic neuropathy, unspecified: Secondary | ICD-10-CM | POA: Diagnosis not present

## 2017-12-23 DIAGNOSIS — G8929 Other chronic pain: Secondary | ICD-10-CM

## 2017-12-23 DIAGNOSIS — I10 Essential (primary) hypertension: Secondary | ICD-10-CM | POA: Diagnosis not present

## 2017-12-23 DIAGNOSIS — M545 Low back pain: Secondary | ICD-10-CM

## 2017-12-23 LAB — POCT GLYCOSYLATED HEMOGLOBIN (HGB A1C): HBA1C, POC (CONTROLLED DIABETIC RANGE): 8.6 % — AB (ref 0.0–7.0)

## 2017-12-23 MED ORDER — TRAMADOL HCL 50 MG PO TABS: 50.0000 mg | ORAL_TABLET | Freq: Three times a day (TID) | ORAL | 0 refills | Status: DC | PRN

## 2017-12-23 NOTE — Progress Notes (Signed)
    Subjective:    Patient ID: Michelle Abbott, female    DOB: 08-11-1945, 72 y.o.   MRN: [id number]   CC: follow up DM  DM- reports she is doing well. She checks her sugars every morning and every night. Her fasting sugars have been about 130-160 in the morning. She has her meter with her. She was recently started on meal time insulin, 8 units with largest meal which tends to be dinner for her. She has been doing this for the past 2 weeks. She is taking victoza 1.8 mg daily and lantus 30 units every morning. She denies increased thirst, hunger, or urination.   HTN- on chlorthalidone 26 mg daily and losartan 100 mg daily. She follows with nephrology as well who have helped manage BP. Denies CP, SOB, headaches, vision changes.  Gout- reports she had a flare start up in June. She has leftover colchicine at home and took a few which knocked it out. She denies any joint pains, redness, swelling. Typically gets gout in her feet.  Back pain- uses tramadol sparingly. Otherwise "just deals with the pain". She tries to exercise but pain is limiting. Pain worse with prolonged walking. She denies numbness/tingling on inner thighs, loss of bladder or bowel function.  Smoking status reviewed- former smoker, quit >15 years ago  Review of Systems- see HPI   Objective:  BP 134/72   Pulse 74   Temp 97.7 F (36.5 C) (Oral)   Ht 5\' 3"  (1.6 m)   Wt 249 lb (112.9 kg)   SpO2 98%   BMI 44.11 kg/m  Vitals and nursing note reviewed  General: pleasant obese AA female in NAD HEENT: normocephalic, MMM Cardiac: RRR, clear S1 and S2, no murmurs, rubs, or gallops Respiratory: clear to auscultation bilaterally, no increased work of breathing Extremities: no edema or cyanosis. +DP and PT pulses bilaterally.  Skin: warm and dry, no rashes noted Neuro: alert and oriented, no focal deficits  Assessment & Plan:    Type 2 diabetes mellitus with diabetic neuropathy, with long-term current use of insulin  (HCC)  A1C worsened from 7.6 to 8.6 however patient just started on 8 units novolog with largest meal about 2 weeks ago. From reviewing her meter her weekly avg CBG is already improved with this regimen. Would anticipate A1C to be better controlled at next check. Will not make any changes at this time. Follow up 3 months for repeat A1C.   Essential hypertension  Chronic, well controlled. Follows with nephrology. She is on chlorthalidone and given hx of gout would consider changing this if patient experiences frequent gout flares but would like to discuss with nephrology if that happens. Continue current regimen.   Gout  Chronic, well controlled on allopurinol. On chlorthalidone as above but will continue this medication at this time as patient has infrequent gout flares. Discussed limiting NSAIDs due to CKD3.  Low back pain  Chronic, exacerbated by morbid obesity. Encouraged weight loss efforts and exercise. Refilled tramadol #15. Patient uses this infrequently. Follow up as needed.     Return in about 3 months (around 03/25/2018).   Lucila Maine, DO Family Medicine Resident PGY-2

## 2017-12-23 NOTE — Patient Instructions (Signed)
  Great to see you today!  I don't want to make any changes to your medications right now. Continue the humalog, lantus, and victoza as you have been. I'll see you back in 3 months to recheck A1C.  Continue exercising, consider water aerobics or recumbent bike as alternatives to walking as these are easier on back.  If you have questions or concerns please do not hesitate to call at [phone].  Lucila Maine, DO PGY-3, [facility] 12/23/2017 2:03 PM

## 2017-12-24 NOTE — Assessment & Plan Note (Signed)
  Chronic, well controlled. Follows with nephrology. She is on chlorthalidone and given hx of gout would consider changing this if patient experiences frequent gout flares but would like to discuss with nephrology if that happens. Continue current regimen.

## 2017-12-24 NOTE — Assessment & Plan Note (Signed)
  A1C worsened from 7.6 to 8.6 however patient just started on 8 units novolog with largest meal about 2 weeks ago. From reviewing her meter her weekly avg CBG is already improved with this regimen. Would anticipate A1C to be better controlled at next check. Will not make any changes at this time. Follow up 3 months for repeat A1C.

## 2017-12-24 NOTE — Assessment & Plan Note (Addendum)
  Chronic, 2/2 lumbar OA and exacerbated by morbid obesity. Encouraged weight loss efforts and exercise. Refilled tramadol #15. Patient uses this infrequently. Follow up as needed.

## 2017-12-24 NOTE — Assessment & Plan Note (Signed)
  Chronic, well controlled on allopurinol. On chlorthalidone as above but will continue this medication at this time as patient has infrequent gout flares. Discussed limiting NSAIDs due to CKD3.

## 2017-12-25 ENCOUNTER — Other Ambulatory Visit: Payer: Self-pay | Admitting: Family Medicine

## 2018-03-28 ENCOUNTER — Ambulatory Visit (INDEPENDENT_AMBULATORY_CARE_PROVIDER_SITE_OTHER): Payer: Medicare Other | Admitting: Family Medicine

## 2018-03-28 ENCOUNTER — Encounter: Payer: Self-pay | Admitting: Family Medicine

## 2018-03-28 ENCOUNTER — Other Ambulatory Visit: Payer: Self-pay

## 2018-03-28 VITALS — BP 140/60 | HR 69 | Temp 97.7°F | Ht 63.0 in | Wt 244.0 lb

## 2018-03-28 DIAGNOSIS — Z23 Encounter for immunization: Secondary | ICD-10-CM

## 2018-03-28 DIAGNOSIS — Z794 Long term (current) use of insulin: Secondary | ICD-10-CM | POA: Diagnosis not present

## 2018-03-28 DIAGNOSIS — I10 Essential (primary) hypertension: Secondary | ICD-10-CM

## 2018-03-28 DIAGNOSIS — E1140 Type 2 diabetes mellitus with diabetic neuropathy, unspecified: Secondary | ICD-10-CM

## 2018-03-28 LAB — POCT GLYCOSYLATED HEMOGLOBIN (HGB A1C): HBA1C, POC (CONTROLLED DIABETIC RANGE): 7.5 % — AB (ref 0.0–7.0)

## 2018-03-28 MED ORDER — ALLOPURINOL 100 MG PO TABS: 100.0000 mg | ORAL_TABLET | Freq: Every day | ORAL | 3 refills | Status: DC

## 2018-03-28 MED ORDER — CHLORTHALIDONE 25 MG PO TABS: 25.0000 mg | ORAL_TABLET | Freq: Every day | ORAL | 1 refills | Status: DC

## 2018-03-28 MED ORDER — GLUCOSE BLOOD VI STRP: ORAL_STRIP | 11 refills | Status: DC

## 2018-03-28 NOTE — Progress Notes (Signed)
    Subjective:    Patient ID: Michelle Abbott, female    DOB: [birth year], 72 y.o.   MRN: [id number]   CC: follow up DM  HPI:  DM- doing well, hoping to come off medicine. Reports some low sugars. Had a reading of 58 in the morning. Will feel shaky and nauseated if its low and she'll eat something to raise sugars. This will happen rarely, maybe 1 time a month.  Taking 1.8 victoza and 30 lantus and  8 humalog w/ big meal (dinner)  14 day avg 120, 30 day avg 130, 90 day avg 131 Has lost weight w/ exercise. Sees Dr. Valentina Lucks soon.   HTN- doing well on medicines, no side effects. No gout flares on the chlorthalidone. She denies CP, SOB, headaches, vision changes.  Colonoscopy- had this done in  Eye dr- saw them this year, she has retinopathy in R eye.  Smoking status reviewed- former smoker  Review of Systems- see HPI   Objective:  BP 140/60   Pulse 69   Temp 97.7 F (36.5 C) (Oral)   Ht 5\' 3"  (1.6 m)   Wt 244 lb (110.7 kg)   SpO2 97%   BMI 43.22 kg/m  Vitals and nursing note reviewed  General: well nourished, in no acute distress HEENT: normocephalic, MMM Cardiac: RRR, clear S1 and S2, no murmurs, rubs, or gallops Respiratory: clear to auscultation bilaterally, no increased work of breathing Extremities: no edema or cyanosis Neuro: alert and oriented, no focal deficits   Assessment & Plan:    Essential hypertension  Chronic, at goal <140/90. Continue current regimen. Follow up 3 months to recheck.  Type 2 diabetes mellitus with diabetic neuropathy, with long-term current use of insulin (HCC) Chronic, A1C today improved to 7.5. Continue current regimen, she is to see Dr. Valentina Lucks in pharmacy clinic and wants to wait to see him to discuss if she should reduce a medicine or not. She has had infrequent episodes of low sugars. Follow up w/ me in 3 months for repeat A1C.     Return in about 3 months (around 06/28/2018).   Lucila Maine, DO Family Medicine Resident  PGY-3

## 2018-03-28 NOTE — Patient Instructions (Signed)
  It was great seeing you today! Keep up the weight loss efforts! We'll see you next week for Dr. Graylin Shiver appointment and then with me in 3 months. If you have questions or concerns please do not hesitate to call at [phone].  Lucila Maine, DO PGY-3, [facility] 03/28/2018 2:42 PM

## 2018-03-29 ENCOUNTER — Encounter: Payer: Self-pay | Admitting: Family Medicine

## 2018-03-29 NOTE — Assessment & Plan Note (Signed)
  Chronic, at goal <140/90. Continue current regimen. Follow up 3 months to recheck.

## 2018-03-29 NOTE — Assessment & Plan Note (Signed)
Chronic, A1C today improved to 7.5. Continue current regimen, she is to see Dr. Valentina Lucks in pharmacy clinic and wants to wait to see him to discuss if she should reduce a medicine or not. She has had infrequent episodes of low sugars. Follow up w/ me in 3 months for repeat A1C.

## 2018-04-07 ENCOUNTER — Ambulatory Visit (INDEPENDENT_AMBULATORY_CARE_PROVIDER_SITE_OTHER): Payer: Medicare Other | Admitting: Pharmacist

## 2018-04-07 ENCOUNTER — Encounter: Payer: Self-pay | Admitting: Pharmacist

## 2018-04-07 ENCOUNTER — Ambulatory Visit (INDEPENDENT_AMBULATORY_CARE_PROVIDER_SITE_OTHER): Payer: Medicare Other

## 2018-04-07 VITALS — BP 134/80 | HR 70 | Temp 97.6°F | Ht 63.0 in | Wt 244.4 lb

## 2018-04-07 DIAGNOSIS — Z794 Long term (current) use of insulin: Secondary | ICD-10-CM | POA: Diagnosis not present

## 2018-04-07 DIAGNOSIS — Z0000 Encounter for general adult medical examination without abnormal findings: Secondary | ICD-10-CM

## 2018-04-07 DIAGNOSIS — E1140 Type 2 diabetes mellitus with diabetic neuropathy, unspecified: Secondary | ICD-10-CM | POA: Diagnosis not present

## 2018-04-07 MED ORDER — LIRAGLUTIDE 18 MG/3ML ~~LOC~~ SOPN: 1.8000 mg | PEN_INJECTOR | Freq: Every day | SUBCUTANEOUS | 2 refills | Status: DC

## 2018-04-07 NOTE — Patient Instructions (Addendum)
It was great to see you today!  Your blood sugar readings have been looking really good. We made some small changes to your insulin regimen today because we want to prevent any further low blood sugars. Please stop taking Humalog with dinner.  Please reduce your Lantus to 26 units in the morning. Since we made these changes, please check your blood sugar in the evenings in addition to the mornings now. Continue taking your other medications as prescribed.  We would like to see you back in 6-8 weeks. Please schedule a follow up with the pharmacy clinic in January.

## 2018-04-07 NOTE — Progress Notes (Signed)
Subjective:   Michelle Abbott is a 72 y.o. female who presents for Medicare Annual (Subsequent) preventive examination.  Review of Systems:  Physical assessment deferred to PCP.  Cardiac Risk Factors include: advanced age (>13men, >19 women);diabetes mellitus;sedentary lifestyle;obesity (BMI >30kg/m2);hypertension     Objective:    Vitals: BP 134/80   Pulse 70   Temp 97.6 F (36.4 C) (Oral)   Ht 5\' 3"  (1.6 m)   Wt 244 lb 6.4 oz (110.9 kg)   SpO2 99%   BMI 43.29 kg/m   Body mass index is 43.29 kg/m.  Advanced Directives 04/07/2018 03/28/2018 12/23/2017 07/29/2017 12/28/2016 11/27/2016 09/21/2016  Does Patient Have a Medical Advance Directive? No No No No No No No  Would patient like information on creating a medical advance directive? No - Patient declined No - Patient declined No - Patient declined No - Patient declined No - Patient declined No - Patient declined No - Patient declined    Tobacco Social History   Tobacco Use  Smoking Status Former Smoker  . Packs/day: 1.50  . Years: 30.00  . Pack years: 45.00  . Types: Cigarettes  . Last attempt to quit: 05/18/1997  . Years since quitting: 20.9  Smokeless Tobacco Never Used  Tobacco Comment   no plans to re-start     Counseling given: Not Answered Comment: no plans to re-start   Clinical Intake:  Pre-visit preparation completed: Yes  Pain : 0-10 Pain Score: 5  Pain Type: Chronic pain Pain Location: Back Pain Onset: More than a month ago Pain Frequency: Constant     Nutritional Status: BMI > 30  Obese Nutritional Risks: None Diabetes: Yes CBG done?: No Did pt. bring in CBG monitor from home?: Yes Glucose Meter Downloaded?: No  How often do you need to have someone help you when you read instructions, pamphlets, or other written materials from your doctor or pharmacy?: 1 - Never What is the last grade level you completed in school?: some college        Past Medical History:  Diagnosis Date  .  Anemia   . Arthritis   . Chronic kidney disease   . Diabetes mellitus without complication ([place])   . Gout 2017  . Hyperlipidemia   . Hypertension   . Sciatica   . Vitamin D deficiency 2014   Past Surgical History:  Procedure Laterality Date  . BREAST SURGERY Right 1961   Benign lump removed  . CATARACT EXTRACTION, BILATERAL Bilateral 2017  . CESAREAN SECTION  1985  . CESAREAN SECTION  1989  . CHOLECYSTECTOMY  1999  . DILATION AND CURETTAGE OF UTERUS  2017  . KNEE ARTHROSCOPY Right   . VAGINAL SEPTOPLASTY  1966   Family History  Problem Relation Age of Onset  . Cancer Mother        ovarian and pancreatic  . Diabetes Mother   . Stroke Mother   . COPD Father   . Arthritis Father   . Cancer Brother        Lung  . COPD Brother    Social History   Socioeconomic History  . Marital status: Widowed    Spouse name: Not on file  . Number of children: 2  . Years of education: 87  . Highest education level: Some college, no degree  Occupational History  . Occupation: Art therapist     Comment: Developmentally disabled persons  Social Needs  . Financial resource strain: Somewhat hard  . Food insecurity:  Worry: Sometimes true    Inability: Sometimes true  . Transportation needs:    Medical: No    Non-medical: No  Tobacco Use  . Smoking status: Former Smoker    Packs/day: 1.50    Years: 30.00    Pack years: 45.00    Types: Cigarettes    Last attempt to quit: 05/18/1997    Years since quitting: 20.9  . Smokeless tobacco: Never Used  . Tobacco comment: no plans to re-start  Substance and Sexual Activity  . Alcohol use: No  . Drug use: No  . Sexual activity: Not Currently  Lifestyle  . Physical activity:    Days per week: 1 day    Minutes per session: 20 min  . Stress: Only a little  Relationships  . Social connections:    Talks on phone: More than three times a week    Gets together: More than three times a week    Attends religious service: More than 4 times  per year    Active member of club or organization: Yes    Attends meetings of clubs or organizations: 1 to 4 times per year    Relationship status: Widowed  Other Topics Concern  . Not on file  Social History Narrative   Lives by herself in one level apt. Daughter lives in same complex. Has grab bars in bathroom. Smoke alarms, one rug under coffee table and at door.   No pets.   Recently moved from [place] in last two years.    Daughter helps with transportation.      Likes to eat meat, some vegetables, fruit. Drinks to drink sweet tea, some coffee. Get $15 in food stamps but runs out of food before end of month.   Enjoys shopping and fellowship. Writes poetry and plays. Word puzzles.    Outpatient Encounter Medications as of 04/07/2018  Medication Sig  . ACCU-CHEK FASTCLIX LANCETS MISC TEST BLOOD SUGAR FOUR TIMES DAILY  . allopurinol (ZYLOPRIM) 100 MG tablet Take 1 tablet (100 mg total) by mouth daily.  Marland Kitchen aspirin EC 81 MG tablet Take 1 tablet (81 mg total) by mouth daily.  . B-D ULTRAFINE III SHORT PEN 31G X 8 MM MISC Use to give insulin twice a day and victoza once a day. Dx code = E11.9  . chlorthalidone (HYGROTON) 25 MG tablet Take 1 tablet (25 mg total) by mouth daily.  . cholecalciferol (VITAMIN D) 1000 units tablet Take 1,000 Units by mouth daily.  . Cyanocobalamin (VITAMIN B-12 PO) Take 1,000 mcg by mouth daily.   . ferrous sulfate 325 (65 FE) MG tablet Take 325 mg by mouth daily.  Marland Kitchen gabapentin (NEURONTIN) 300 MG capsule Take 1 capsule (300 mg total) by mouth at bedtime.  Marland Kitchen glucose blood (ACCU-CHEK SMARTVIEW) test strip TEST BLOOD SUGAR FOUR TIMES DAILY  . insulin lispro (HUMALOG KWIKPEN) 100 UNIT/ML KiwkPen Inject 0.08 mLs (8 Units total) into the skin daily before supper.  Marland Kitchen LANTUS SOLOSTAR 100 UNIT/ML Solostar Pen Inject 30 Units into the skin daily.  Marland Kitchen liraglutide (VICTOZA) 18 MG/3ML SOPN Inject 0.3 mLs (1.8 mg total) into the skin daily. Victoza  . losartan (COZAAR) 100 MG  tablet Take 1 tablet (100 mg total) by mouth daily.  . metoprolol succinate (TOPROL-XL) 25 MG 24 hr tablet Take 1 tablet (25 mg total) by mouth daily.  . Multiple Vitamin (MULTIVITAMIN WITH MINERALS) TABS tablet Take 1 tablet by mouth daily.  . polyethylene glycol powder (GLYCOLAX/MIRALAX) powder Take 17 g  by mouth 2 (two) times daily as needed. (Patient taking differently: Take 17 g by mouth 2 (two) times daily as needed (constipation). )  . rosuvastatin (CRESTOR) 40 MG tablet Take 1 tablet (40 mg total) by mouth daily.  . traMADol (ULTRAM) 50 MG tablet Take 1 tablet (50 mg total) by mouth every 8 (eight) hours as needed (pain).   No facility-administered encounter medications on file as of 04/07/2018.     Activities of Daily Living In your present state of health, do you have any difficulty performing the following activities: 04/07/2018  Hearing? N  Vision? N  Comment new glasses this year  Difficulty concentrating or making decisions? N  Walking or climbing stairs? N  Dressing or bathing? N  Doing errands, shopping? N  Preparing Food and eating ? N  Using the Toilet? N  In the past six months, have you accidently leaked urine? N  Do you have problems with loss of bowel control? N  Managing your Medications? N  Managing your Finances? N  Housekeeping or managing your Housekeeping? N  Some recent data might be hidden    Patient Care Team: Steve Rattler, DO as PCP - General Leavy Cella, [facility] (Pharmacist) Jamal Maes, MD as Consulting Physician (Nephrology) [facility])    Assessment:   This is a routine wellness examination for [place].  Exercise Activities and Dietary recommendations Current Exercise Habits: The patient does not participate in regular exercise at present, Exercise limited by: None identified  Goals      Patient Stated   . Eat more fruits and vegetables (pt-stated)      Other   . Blood Pressure < 140/90       Fall Risk Fall Risk   04/07/2018 07/29/2017 05/07/2017 12/28/2016 11/27/2016  Falls in the past year? 0 No No No No  Number falls in past yr: 0 - - - -  Injury with Fall? 0 - - - -  Risk for fall due to : Other (Comment) - - - -  Risk for fall due to: Comment - - - - -   Is the patient's home free of loose throw rugs in walkways, pet beds, electrical cords, etc?   yes      Grab bars in the bathroom? yes      Handrails on the stairs?   yes      Adequate lighting?   yes   Depression Screen PHQ 2/9 Scores 04/07/2018 12/23/2017 07/29/2017 05/07/2017  PHQ - 2 Score 0 0 0 0     Cognitive Function MMSE - Mini Mental State Exam 04/07/2018  Orientation to time 5  Orientation to Place 5  Registration 3  Attention/ Calculation 5  Recall 3  Language- name 2 objects 2  Language- repeat 1  Language- follow 3 step command 3  Language- read & follow direction 1  Write a sentence 1  Copy design 1  Total score 30     6CIT Screen 04/07/2018  What Year? 0 points  What month? 0 points  What time? 0 points  Count back from 20 0 points  Months in reverse 0 points  Repeat phrase 0 points  Total Score 0    Immunization History  Administered Date(s) Administered  . Influenza,inj,Quad PF,6+ Mos 03/28/2018  . Influenza-Unspecified 03/10/2016, 05/06/2017  . Pneumococcal Conjugate-13 03/10/2016  . Pneumococcal Polysaccharide-23 12/23/2017     Screening Tests Health Maintenance  Topic Date Due  . TETANUS/TDAP  04/08/2019 (Originally 01/28/1965)  .  OPHTHALMOLOGY EXAM  07/21/2018  . HEMOGLOBIN A1C  09/26/2018  . FOOT EXAM  12/24/2018  . MAMMOGRAM  06/09/2019  . COLONOSCOPY  09/22/2027  . INFLUENZA VACCINE  Completed  . DEXA SCAN  Completed  . Hepatitis C Screening  Completed  . PNA vac Low Risk Adult  Completed    Cancer Screenings: Lung: Low Dose CT Chest recommended if Age 82-80 years, 30 pack-year currently smoking OR have quit w/in 15years. Patient does not qualify. Breast:  Up to date on Mammogram? Yes    Up to date of Bone Density/Dexa? Yes  Patient reports normal DEXA in 2016 at [facility]  Colorectal: up to date  Additional Screenings: : Hepatitis C Screening: complete     Plan:  All health maintenance up to date. LCSW to speak with patient regarding food box program due to food insecurity issues.    I have personally reviewed and noted the following in the patient's chart:   . Medical and social history . Use of alcohol, tobacco or illicit drugs  . Current medications and supplements . Functional ability and status . Nutritional status . Physical activity . Advanced directives . List of other physicians . Hospitalizations, surgeries, and ER visits in previous 12 months . Vitals . Screenings to include cognitive, depression, and falls . Referrals and appointments  In addition, I have reviewed and discussed with patient certain preventive protocols, quality metrics, and best practice recommendations. A written personalized care plan for preventive services as well as general preventive health recommendations were provided to patient.     Esau Grew, RN  04/07/2018

## 2018-04-07 NOTE — Assessment & Plan Note (Signed)
Diabetes longstanding currently controlled with some incidences of symptomatic hypoglycemia. Patient is able to verbalize appropriate hypoglycemia management plan. Patient is adherent with medication. Control is suboptimal due to hypoglycemic incidences. -Decreased dose of basal insulin glargine  (Lantus) from 30 units to 26 units daily.  -Discontinued  rapid insulin lispro (Humalog) - (8 units largest meal).  -Continued GLP-1 Victoza (generic name liraglutide) 1.8mg  daily. Refilled today. -Recommended checking post prandial blood glucose levels several nights a week in addition to daily levels in the morning. -Extensively discussed pathophysiology of DM, recommended lifestyle interventions, dietary effects on glycemic control -Counseled on s/sx of and management of hypoglycemia

## 2018-04-07 NOTE — Progress Notes (Signed)
    S:     Chief Complaint  Patient presents with  . Medication Management    Diabetes    Patient arrives in good spirits, ambulating well.  Presents for diabetes evaluation, education, and management at the request of  Dr. Vanetta Shawl. Patient was referred on 05/04/17.  Patient was last seen by Primary Care Provider on 03/28/18.   Insurance coverage/medication affordability: UHC  Patient reports adherence with medications.  Current diabetes medications include: Lantus 30 units daily, humalog 8 units w/ dinner, liraglutide 1.8mg  daily Current hypertension medications include: losartan, chlorthalidone, metoprolol succinate  Patient reports hypoglycemic events. She says she "can just tell". Reports shaking when less than 70. Reports this has happened 2-3 times since her last visit.  Patient reported dietary habits: Eats 2 meals/day  Reports that she sometimes runs out of meat, but has enough to eat.    Patient reports nocturia.  Patient reports neuropathy, but believes current medication is working well for her.  Patient reports visual changes, reports a recent visit to the eye doctor Patient reports self foot exams.   O:  Physical Exam  Constitutional: She appears well-developed and well-nourished.  Vitals reviewed.    Review of Systems  All other systems reviewed and are negative.    Lab Results  Component Value Date   HGBA1C 7.5 (A) 03/28/2018   Vitals:   04/07/18 1444  BP: 132/84  Pulse: 77  SpO2: 98%    Lipid Panel     Component Value Date/Time   CHOL 128 07/29/2017 1405   TRIG 111 07/29/2017 1405   HDL 48 07/29/2017 1405   CHOLHDL 2.7 07/29/2017 1405   LDLCALC 58 07/29/2017 1405    Home fasting CBG:  7 day average: 144 14 day average: 133 30 day average: 131 90 day average: 131  A/P: Diabetes longstanding currently controlled with some incidences of symptomatic hypoglycemia. Patient is able to verbalize appropriate hypoglycemia management plan.  Patient is adherent with medication. Control is suboptimal due to hypoglycemic incidences. -Decreased dose of basal insulin glargine  (Lantus) from 30 units to 26 units daily.  -Discontinued  rapid insulin lispro (Humalog) - (8 units largest meal).  -Continued GLP-1 Victoza (generic name liraglutide) 1.8mg  daily. -Recommended checking post prandial blood glucose levels several nights a week in addition to daily levels in the morning. -Extensively discussed pathophysiology of DM, recommended lifestyle interventions, dietary effects on glycemic control -Counseled on s/sx of and management of hypoglycemia    Written patient instructions provided.  Total time in face to face counseling 30 minutes.   Follow up Pharmacist Clinic Visit in January.   Patient seen with Harrietta Guardian, PharmD, PGY-1 resident.

## 2018-04-07 NOTE — Patient Instructions (Addendum)
Ms. Zavalza , Thank you for taking time to come for your Medicare Wellness Visit. I appreciate your ongoing commitment to your health goals. Please review the following plan we discussed and let me know if I can assist you in the future.   These are the goals we discussed: Goals      Patient Stated   . Eat more fruits and vegetables (pt-stated)      Other   . Blood Pressure < 140/90       This is a list of the screening recommended for you and due dates:  Health Maintenance  Topic Date Due  . Tetanus Vaccine  04/08/2019*  . Eye exam for diabetics  07/21/2018  . Hemoglobin A1C  09/26/2018  . Complete foot exam   12/24/2018  . Mammogram  06/09/2019  . Colon Cancer Screening  09/22/2027  . Flu Shot  Completed  . DEXA scan (bone density measurement)  Completed  .  Hepatitis C: One time screening is recommended by Center for Disease Control  (CDC) for  adults born from [birth year] through 1963.   Completed  . Pneumonia vaccines  Completed  *Topic was postponed. The date shown is not the original due date.    Fall Prevention in the Home Falls can cause injuries. They can happen to people of all ages. There are many things you can do to make your home safe and to help prevent falls. What can I do on the outside of my home?  Regularly fix the edges of walkways and driveways and fix any cracks.  Remove anything that might make you trip as you walk through a door, such as a raised step or threshold.  Trim any bushes or trees on the path to your home.  Use bright outdoor lighting.  Clear any walking paths of anything that might make someone trip, such as rocks or tools.  Regularly check to see if handrails are loose or broken. Make sure that both sides of any steps have handrails.  Any raised decks and porches should have guardrails on the edges.  Have any leaves, snow, or ice cleared regularly.  Use sand or salt on walking paths during winter.  Clean up any spills in your garage  right away. This includes oil or grease spills. What can I do in the bathroom?  Use night lights.  Install grab bars by the toilet and in the tub and shower. Do not use towel bars as grab bars.  Use non-skid mats or decals in the tub or shower.  If you need to sit down in the shower, use a plastic, non-slip stool.  Keep the floor dry. Clean up any water that spills on the floor as soon as it happens.  Remove soap buildup in the tub or shower regularly.  Attach bath mats securely with double-sided non-slip rug tape.  Do not have throw rugs and other things on the floor that can make you trip. What can I do in the bedroom?  Use night lights.  Make sure that you have a light by your bed that is easy to reach.  Do not use any sheets or blankets that are too big for your bed. They should not hang down onto the floor.  Have a firm chair that has side arms. You can use this for support while you get dressed.  Do not have throw rugs and other things on the floor that can make you trip. What can I do in the kitchen?  Clean up any spills right away.  Avoid walking on wet floors.  Keep items that you use a lot in easy-to-reach places.  If you need to reach something above you, use a strong step stool that has a grab bar.  Keep electrical cords out of the way.  Do not use floor polish or wax that makes floors slippery. If you must use wax, use non-skid floor wax.  Do not have throw rugs and other things on the floor that can make you trip. What can I do with my stairs?  Do not leave any items on the stairs.  Make sure that there are handrails on both sides of the stairs and use them. Fix handrails that are broken or loose. Make sure that handrails are as long as the stairways.  Check any carpeting to make sure that it is firmly attached to the stairs. Fix any carpet that is loose or worn.  Avoid having throw rugs at the top or bottom of the stairs. If you do have throw rugs,  attach them to the floor with carpet tape.  Make sure that you have a light switch at the top of the stairs and the bottom of the stairs. If you do not have them, ask someone to add them for you. What else can I do to help prevent falls?  Wear shoes that: ? Do not have high heels. ? Have rubber bottoms. ? Are comfortable and fit you well. ? Are closed at the toe. Do not wear sandals.  If you use a stepladder: ? Make sure that it is fully opened. Do not climb a closed stepladder. ? Make sure that both sides of the stepladder are locked into place. ? Ask someone to hold it for you, if possible.  Clearly mark and make sure that you can see: ? Any grab bars or handrails. ? First and last steps. ? Where the edge of each step is.  Use tools that help you move around (mobility aids) if they are needed. These include: ? Canes. ? Walkers. ? Scooters. ? Crutches.  Turn on the lights when you go into a dark area. Replace any light bulbs as soon as they burn out.  Set up your furniture so you have a clear path. Avoid moving your furniture around.  If any of your floors are uneven, fix them.  If there are any pets around you, be aware of where they are.  Review your medicines with your doctor. Some medicines can make you feel dizzy. This can increase your chance of falling. Ask your doctor what other things that you can do to help prevent falls. This information is not intended to replace advice given to you by your health care provider. Make sure you discuss any questions you have with your health care provider. Document Released: 02/28/2009 Document Revised: 10/10/2015 Document Reviewed: 06/08/2014 Elsevier Interactive Patient Education  [employer].

## 2018-04-08 ENCOUNTER — Encounter: Payer: Self-pay | Admitting: Licensed Clinical Social Worker

## 2018-04-08 NOTE — Progress Notes (Signed)
Type of Service: Clinical Social Work [facility] intern consult with patient after receiving referral from [employer] for food insecurity. Patient reported that she is only getting 15 dollars in food stamps and is having trouble buying healthy food. [facility] intern discussed food box program with patient. Patient agreed to the food box program. Patient received a food box and is planning to attend the next food box meeting if her daughter is able to take her.   Social: patient lives alone. 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 Recent life changes: Patient moved to [place] from [place] about two years ago and states she is still adjusting to [place]. Intervention, [employer] , Plan: 1. Patient will attend next food box meeting on 11/26 2. [facility] intern will follow up with patient in 5-7 business days to see if she was successfully connected.  Audry Riles, [place] intern Behavioral Health Clinician,  [place] [phone]

## 2018-06-06 NOTE — Progress Notes (Signed)
  I have reviewed the documentation from [place] and agree.  Lucila Maine, DO PGY-3, [facility] 06/06/2018 8:39 AM

## 2018-06-09 ENCOUNTER — Ambulatory Visit: Payer: Medicare Other | Admitting: Licensed Clinical Social Worker

## 2018-06-09 ENCOUNTER — Encounter: Payer: Self-pay | Admitting: Pharmacist

## 2018-06-09 ENCOUNTER — Ambulatory Visit (INDEPENDENT_AMBULATORY_CARE_PROVIDER_SITE_OTHER): Payer: Medicare Other | Admitting: Pharmacist

## 2018-06-09 DIAGNOSIS — E1140 Type 2 diabetes mellitus with diabetic neuropathy, unspecified: Secondary | ICD-10-CM | POA: Diagnosis not present

## 2018-06-09 DIAGNOSIS — Z794 Long term (current) use of insulin: Secondary | ICD-10-CM

## 2018-06-09 DIAGNOSIS — Z789 Other specified health status: Secondary | ICD-10-CM

## 2018-06-09 DIAGNOSIS — E782 Mixed hyperlipidemia: Secondary | ICD-10-CM

## 2018-06-09 MED ORDER — LANTUS SOLOSTAR 100 UNIT/ML ~~LOC~~ SOPN: 26.0000 [IU] | PEN_INJECTOR | Freq: Every day | SUBCUTANEOUS | 3 refills | Status: DC

## 2018-06-09 NOTE — Assessment & Plan Note (Signed)
Diabetes longstanding currently controlled. Patient is able to verbalize appropriate hypoglycemia management plan. Patient is adherent with medication.  -Continued current medications: Lantus (insulin glargine) 26 units daily, Victoza (liraglutide) 1.8 mg daily.  -Due to CKD, discussed adding SGLT2-I in the future. Explained rationale to patient to prevent progression of kidney disease. She was agreeable to consider at some time in the future. -Extensively discussed pathophysiology of DM, recommended lifestyle interventions, dietary effects on glycemic control -Counseled on s/sx of and management of hypoglycemia -Next A1C anticipated 06/2018.

## 2018-06-09 NOTE — Addendum Note (Signed)
Addended by: Casimer Lanius H on: 06/09/2018 01:50 PM   Modules accepted: Level of Service

## 2018-06-09 NOTE — Assessment & Plan Note (Signed)
ASCVD risk - primary prevention in patient with DM. Last LDL is controlled.  -Continued aspirin 81 mg  -Continued rosuvastatin 40 mg (reinforced goals of care and need for adherence).

## 2018-06-09 NOTE — BH Specialist Note (Addendum)
Integrated Behavioral Health Initial Visit  MRN: [id number] Name: Michelle Abbott  Session Start time: 10:00 Session End time: 10:20 Total time: 20 minutes  Type of Service: Integrated Behavioral Health   Warm Hand Off Completed.      Patient and/or legal guardian verbally consented to meet with [place] about presenting concerns.  SUBJECTIVE: Michelle Abbott is a 73 y.o. female referred by Dr Vanetta Shawl  for a follow up with the food box program.  ASSESSMENT: Mood: NA and Affect: Appropriate Patient is currently experiencing food insecurity as a result of receiving only 15 dollars a month in food stamps. Patient reports she will not be able to go to the food box program meetings as she has a lack of transportation due to her daughter being at work. Patient reported that the food box has helped her act as a supplement for healthier foods to manage her Diabetes. [facility] intern gave client a box and contacted one step further to see about the patient being added to the delivery route due to transportation barriers with getting her box  PLAN: 1. [facility] intern will contact patient after hearing from Manuela Schwartz from [employer] program about possibly food box delivery.  _______________________________________________________     INTERVENTION:  Community resources.   Audry Riles, [place] intern Behavioral Health Clinician,  [place] [phone]

## 2018-06-09 NOTE — Progress Notes (Signed)
S:     Chief Complaint  Patient presents with  . Medication Management    Diabetes    Patient arrives in good spirits, ambulating without assistance.    Presents for diabetes evaluation, education, and management at the request of  Dr. Vanetta Shawl. Patient was referred on 05/04/17.  Patient was last seen by Primary Care Provider on 03/28/18. Last seen in [facility] on 04/07/2018 - at that time, Humalog was discontinued and Lantus dose was reduced d/t hypoglycemia.  Does report issues for sleep, but she stays up late on her laptop; "mind won't turn off". 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 Patient denies side effects but reports sometimes she "gets tired" of taking medication  Patient denies hypoglycemic events with BG <70; but does note that she feels "jittery" when BG <110. Notes improvement in hypoglycemic episodes since last pharmacy visit  Patient reported dietary habits: Eats 2 meals/day; does have an evening snack occasionally - One fasting reading of 181 was the morning after an evening fruitcake snack - Keeps oranges and pears as snacks; does have some popcorn, cookies   Patient denies nocturia.  Patient denies neuropathy - though reports improvement; has not been taking gabapentin Patient reports visual changes, but relates it to her glasses, not blood sugars. Plans to f/u with eye doctor  - Reports some  diabetic retinopathy in the right eye from previous retinal exam    O:  Physical Exam Vitals signs reviewed.  Constitutional:      Appearance: Normal appearance.  Neurological:     Mental Status: She is alert.      Review of Systems  All other systems reviewed and are negative.    Lab Results  Component Value Date   HGBA1C 7.5 (A) 03/28/2018   Vitals:   06/09/18 1106 06/09/18 1136  BP: (!) 152/80 (!) 148/82  Pulse: 63   SpO2: 95%     Lipid Panel     Component Value Date/Time   CHOL 128 07/29/2017 1405   TRIG 111 07/29/2017 1405   HDL 48 07/29/2017 1405   CHOLHDL 2.7 07/29/2017 1405   LDLCALC 58 07/29/2017 1405    Home fasting CBG: 120s-130s 7 day average 131 14 day average 141  Clinical ASCVD: No  The ASCVD Risk score Mikey Bussing DC Jr., et al., 2013) failed to calculate for the following reasons:   The valid total cholesterol range is 130 to 320 mg/dL    A/P: Diabetes longstanding currently controlled. Patient is able to verbalize appropriate hypoglycemia management plan. Patient is adherent with medication.  -Continued current medications: Lantus (insulin glargine) 26 units daily, Victoza (liraglutide) 1.8 mg daily.  -Due to CKD, discussed adding SGLT2-I in the future. Explained rationale to patient to prevent progression of kidney disease. She was agreeable to consider at some time in the future. -Extensively discussed pathophysiology of DM, recommended lifestyle interventions, dietary effects on glycemic control -Counseled on s/sx of and management of hypoglycemia -Next A1C anticipated 06/2018.  ASCVD risk - primary prevention in patient with DM. Last LDL is controlled.  -Continued aspirin 81 mg  -Continued rosuvastatin 40 mg (reinforced goals of care and need for adherence).   Hypertension longstanding currently controlled. In office reading today was 148/82, but previous trends in office are within target range around 514 systolic.  BP goal = 130 mmHg.  Patient is adherent with medication.  -Continue losartan 100 mg, metoprolol succinate 25 mg, Chlorthalidone 25mg    Written patient instructions provided.  Total time in face to face counseling 30 minutes.   Follow up PCP Clinic Visit in late February or early March.   Patient seen with Emeline General, PharmD Candidate and Catie Darnelle Maffucci, PharmD,  PGY2 Pharmacy Resident.

## 2018-06-09 NOTE — Patient Instructions (Addendum)
It was great to see you today! You are doing a great job.   We are not going to make any diabetes changes: Keep taking Lantus 26 units daily and Victoza 1.8 mg daily.   Restart the rosuvastatin. You can take this in the morning, if this helps!   Talk to your kidney doctor about a class of medications for diabetes that helps with your kidney function. An example would be Jardiance (empagliflozin). We could add this on to treat your blood sugars, and we could reduce how much insulin you need to take on a daily basis.   Check your blood pressure at home a few times a week. Bring these readings to future appointments.   Please call the clinic if you start to have more low blood sugars.    Schedule follow up with Dr. Lyndal Pulley in late February/early March.

## 2018-06-09 NOTE — Progress Notes (Signed)
Patient ID: Michelle Abbott, female   DOB: 10-29-1945, 73 y.o.   MRN: [id number] Reviewed: Agree with Dr. Graylin Shiver documentation and management.

## 2018-06-13 ENCOUNTER — Telehealth: Payer: Self-pay | Admitting: Family Medicine

## 2018-06-13 NOTE — Telephone Encounter (Signed)
Prescription for Tramadol dated 10/04/17 was never picked up and was shredded.

## 2018-06-23 DIAGNOSIS — E785 Hyperlipidemia, unspecified: Secondary | ICD-10-CM | POA: Diagnosis not present

## 2018-06-23 DIAGNOSIS — M109 Gout, unspecified: Secondary | ICD-10-CM | POA: Diagnosis not present

## 2018-06-23 DIAGNOSIS — I129 Hypertensive chronic kidney disease with stage 1 through stage 4 chronic kidney disease, or unspecified chronic kidney disease: Secondary | ICD-10-CM | POA: Diagnosis not present

## 2018-06-23 DIAGNOSIS — D631 Anemia in chronic kidney disease: Secondary | ICD-10-CM | POA: Diagnosis not present

## 2018-06-23 DIAGNOSIS — N183 Chronic kidney disease, stage 3 (moderate): Secondary | ICD-10-CM | POA: Diagnosis not present

## 2018-06-23 DIAGNOSIS — E1122 Type 2 diabetes mellitus with diabetic chronic kidney disease: Secondary | ICD-10-CM | POA: Diagnosis not present

## 2018-06-23 DIAGNOSIS — N189 Chronic kidney disease, unspecified: Secondary | ICD-10-CM | POA: Diagnosis not present

## 2018-06-29 ENCOUNTER — Encounter: Payer: Self-pay | Admitting: Family Medicine

## 2018-06-29 ENCOUNTER — Ambulatory Visit (INDEPENDENT_AMBULATORY_CARE_PROVIDER_SITE_OTHER): Payer: Medicare Other | Admitting: Family Medicine

## 2018-06-29 VITALS — BP 150/70 | HR 98 | Temp 98.1°F | Wt 241.4 lb

## 2018-06-29 DIAGNOSIS — Z794 Long term (current) use of insulin: Secondary | ICD-10-CM

## 2018-06-29 DIAGNOSIS — E1140 Type 2 diabetes mellitus with diabetic neuropathy, unspecified: Secondary | ICD-10-CM

## 2018-06-29 DIAGNOSIS — N183 Chronic kidney disease, stage 3 unspecified: Secondary | ICD-10-CM

## 2018-06-29 DIAGNOSIS — M170 Bilateral primary osteoarthritis of knee: Secondary | ICD-10-CM | POA: Diagnosis not present

## 2018-06-29 DIAGNOSIS — I10 Essential (primary) hypertension: Secondary | ICD-10-CM

## 2018-06-29 LAB — POCT GLYCOSYLATED HEMOGLOBIN (HGB A1C): HbA1c, POC (controlled diabetic range): 7.9 % — AB (ref 0.0–7.0)

## 2018-06-29 MED ORDER — METOPROLOL SUCCINATE ER 50 MG PO TB24: 50.0000 mg | ORAL_TABLET | Freq: Every day | ORAL | 0 refills | Status: DC

## 2018-06-29 MED ORDER — TRAMADOL HCL 50 MG PO TABS: 50.0000 mg | ORAL_TABLET | Freq: Three times a day (TID) | ORAL | 0 refills | Status: DC | PRN

## 2018-06-29 NOTE — Progress Notes (Signed)
    Subjective:    Patient ID: Michelle Abbott, female    DOB: Aug 25, 1945, 73 y.o.   MRN: [id number]   CC: follow up DM  DM- "sugars doin real good". She is taking lantus 26 units, victoza 1.8. reports good compliance, does not miss doses.  Reports poor appetite during day, eats a bigger meal at night.  She is hopeful A1C is down. She does not want to add another medication at this time but is open to talking about it more with Dr. Valentina Lucks at her next appointment. She recalls at the last appointment with pharmacy clinic they mentioned adding another medication.   Feeling slightly nauseated more in the morning, maybe some component of heartburn. She reports she had regurgitated some bile once but none since.  Knees hurt bilaterally especially with a lot of walking, managed usually with tylenol but uses tramadol for severe pain. Is going on a trip in next couple of weeks and anticipates a lot of walking so is requesting a refill of tramadol. Last filled in August.   Otherwise feels good. Has appt w/ eye doctor coming up as her vision seems to be worsening. She also saw nephro last week. Kidney doctor said kidneys worsening at her last appointment.  Smoking status reviewed- former smoker  Review of Systems- see HPI   Objective:  BP (!) 150/70   Pulse 98   Temp 98.1 F (36.7 C) (Oral)   Wt 241 lb 6 oz (109.5 kg)   SpO2 96%   BMI 42.76 kg/m  Vitals and nursing note reviewed  General: well nourished, in no acute distress HEENT: normocephalic, MMM Cardiac: RRR, clear S1 and S2, no murmurs, rubs, or gallops Respiratory: clear to auscultation bilaterally, no increased work of breathing Extremities: no edema or cyanosis. Neuro: alert and oriented, no focal deficits  Assessment & Plan:    Type 2 diabetes mellitus with diabetic neuropathy, with long-term current use of insulin (HCC)  Chronic, uncontrolled. A1C worsened to 7.9 from last check of 7.5. Discussed adding another medication.  Discussed importance of managing sugars due to her worsening kidney disease. Patient is reluctant to change at this time. She would like to discuss with Dr. Valentina Lucks. Appointment made for her to follow up with him.   Essential hypertension  Not at goal <140/90. Discussed importance of lowering BP in setting of kidney disease. She is agreeable to increase her metoprolol. She will take 50 mg. Follow up with Dr. Valentina Lucks in 1 month to check in. If still elevated could add norvasc at that time.   Osteoarthritis  Refilled tramadol. Continue tylenol as needed, add icing after activity.  CKD (chronic kidney disease), stage III ([place])  Followed by [facility], per patient at her visit last week her kidney function had worsened but no medication changes were made. I had a long discussion with her about the importance of controlling blood pressure and blood sugars to help prevent progression of kidney issues. She verbalized understanding and we did increase her metoprolol for BP but she was hesitant to add another diabetes medication at this time. Follow up with pharm in 1 month and with me in 3 months. Awaiting kidney notes    Return in about 3 months (around 09/27/2018).   Lucila Maine, DO Family Medicine Resident PGY-3

## 2018-06-29 NOTE — Patient Instructions (Addendum)
  Increase metoprolol to 50 mg daily- so take 2 tablets at once until you run out of medicine, then I'll send the higher dose to your pharmacy.  We'll see you in 1 month to talk about diabetes/adding a new medication.  If you have questions or concerns please do not hesitate to call at [phone].  Lucila Maine, DO PGY-3, [facility] 06/29/2018 3:49 PM

## 2018-06-30 ENCOUNTER — Telehealth: Payer: Self-pay | Admitting: Licensed Clinical Social Worker

## 2018-06-30 NOTE — Assessment & Plan Note (Signed)
  Chronic, uncontrolled. A1C worsened to 7.9 from last check of 7.5. Discussed adding another medication. Discussed importance of managing sugars due to her worsening kidney disease. Patient is reluctant to change at this time. She would like to discuss with Dr. Valentina Lucks. Appointment made for her to follow up with him.

## 2018-06-30 NOTE — Assessment & Plan Note (Signed)
  Refilled tramadol. Continue tylenol as needed, add icing after activity.

## 2018-06-30 NOTE — Telephone Encounter (Signed)
Type of Service: Clinical Social Work  [facility] intern phone call to patient to follow up with her on if she has received a call from the food box program for her delivery as the patient was approved for the food box delivery. Patient states she has not received a call from anyone. [facility] followed up with her supervisor and supervisor will contact the food box program to find out when next delivery will be and to ask them to follow up with this patient.  Plan: 1. Patient will wait for a  call from the food box program 2. [facility] intern will call patient in 5-7 business days to see if she has received a call from the foodbox program.  Audry Riles, [place],  [place] [phone]

## 2018-06-30 NOTE — Assessment & Plan Note (Signed)
  Not at goal <140/90. Discussed importance of lowering BP in setting of kidney disease. She is agreeable to increase her metoprolol. She will take 50 mg. Follow up with Dr. Valentina Lucks in 1 month to check in. If still elevated could add norvasc at that time.

## 2018-07-01 NOTE — Assessment & Plan Note (Signed)
  Followed by [facility], per patient at her visit last week her kidney function had worsened but no medication changes were made. I had a long discussion with her about the importance of controlling blood pressure and blood sugars to help prevent progression of kidney issues. She verbalized understanding and we did increase her metoprolol for BP but she was hesitant to add another diabetes medication at this time. Follow up with pharm in 1 month and with me in 3 months. Awaiting kidney notes

## 2018-07-13 ENCOUNTER — Telehealth: Payer: Self-pay | Admitting: Licensed Clinical Social Worker

## 2018-07-13 NOTE — Telephone Encounter (Signed)
Type of Service: Clinical Social Work  [facility] intern phone call to patient to follow up on if patient has received food box. Patient reports she still has not heard from them and hasn't received her food box. [facility] intern confirmed that Jannet Mantis, the program director will be calling the patient tomorrow to speak with her.    Plan 1. [facility] intern will follow up in 5-7 business days to ensure the patient spoke with director and received her food box.    Audry Riles, [place] intern Behavioral Health Clinician,  [place] [phone]

## 2018-07-14 ENCOUNTER — Other Ambulatory Visit: Payer: Self-pay | Admitting: Family Medicine

## 2018-07-14 NOTE — Telephone Encounter (Signed)
I am covering for Dr. Vanetta Shawl. Please call patient and clarify refill request. Dr. Vanetta Shawl and patient increased her metoprolol to 50mg  QD at last visit, but the refill request is for 25mg . Has she been taking 50mg ? Does she need a refill of that dose?

## 2018-07-14 NOTE — Telephone Encounter (Signed)
LM for patient to call back so we can clarify medication.  Jarren Para,CMA

## 2018-07-14 NOTE — Telephone Encounter (Signed)
Pt was taking TWO 25mg  daily to get rid of them.  She would like a 50mg  tablet called into [facility]. Emillia Weatherly, [place]otted, CMA

## 2018-07-26 DIAGNOSIS — H5203 Hypermetropia, bilateral: Secondary | ICD-10-CM | POA: Diagnosis not present

## 2018-07-26 DIAGNOSIS — E083292 Diabetes mellitus due to underlying condition with mild nonproliferative diabetic retinopathy without macular edema, left eye: Secondary | ICD-10-CM | POA: Diagnosis not present

## 2018-07-28 ENCOUNTER — Ambulatory Visit (INDEPENDENT_AMBULATORY_CARE_PROVIDER_SITE_OTHER): Payer: Medicare Other | Admitting: Pharmacist

## 2018-07-28 ENCOUNTER — Other Ambulatory Visit: Payer: Self-pay

## 2018-07-28 ENCOUNTER — Encounter: Payer: Self-pay | Admitting: Pharmacist

## 2018-07-28 VITALS — BP 148/78 | HR 74 | Ht 62.0 in | Wt 241.2 lb

## 2018-07-28 DIAGNOSIS — I10 Essential (primary) hypertension: Secondary | ICD-10-CM | POA: Diagnosis not present

## 2018-07-28 DIAGNOSIS — E1140 Type 2 diabetes mellitus with diabetic neuropathy, unspecified: Secondary | ICD-10-CM | POA: Diagnosis not present

## 2018-07-28 DIAGNOSIS — Z794 Long term (current) use of insulin: Secondary | ICD-10-CM | POA: Diagnosis not present

## 2018-07-28 MED ORDER — METOPROLOL SUCCINATE ER 100 MG PO TB24: 100.0000 mg | ORAL_TABLET | Freq: Every day | ORAL | 2 refills | Status: DC

## 2018-07-28 MED ORDER — LANTUS SOLOSTAR 100 UNIT/ML ~~LOC~~ SOPN: 30.0000 [IU] | PEN_INJECTOR | Freq: Every day | SUBCUTANEOUS | 3 refills | Status: DC

## 2018-07-28 NOTE — Progress Notes (Signed)
    S:     Chief Complaint  Patient presents with  . Medication Management    Diabetes    Patient arrives in good spirits, ambulating without assistance.  Presents for diabetes evaluation, education, and management at the request of Dr. Vanetta Shawl. Patient was referred on12/18/18. Patient was last seen by Primary Care Provider on 06/29/2018. Last seen in [facility] on 06/09/2018 - no changes were made at that time.   In the interim, she was seen by nephrology (see Media tab, 06/23/2018, eGFR at 30).   She notes that she was able to pick up a food box, and they will start delivering to her in the next few weeks. She states that she saw opthalmology earlier this week and they noted she did have signs of diabetic retinopathy.   Insurance coverage/medication affordability: [facility] Advantage   Patient reports adherence with medications.  Current diabetes medications include: Lantus 26 units daily, Victoza 1.8 mg daily Current hypertension medications include: chlorthalidone 25 mg daily, metoprolol succinate 50 mg daily, losartan 100 mg daily  Current hyperlipidemia medications include: rosuvastatin 40 mg daily  Patient denies hypoglycemic events.  Patient reported dietary habits: Eats 1-2 meals/day Breakfast: typically skips because she isn't hungry Lunch/Dinner: Hot dogs; potatoes; pasta; notes that she has stopped frying food and bakes it now; frozen dinners; used to get fresh greens; squash Snacks: Crackers; peanut butter; popcorn, popcorn;  Drinks: Water; sweet tea -> real sugar + 1 artificial sweetener  Patient-reported exercise habits: Nothing at this time; a little bit more walking around the house lately    Patient reports 2x nocturia.  Patient denies neuropathy. Patient reports visual changes.  O:  Physical Exam Constitutional:      Appearance: Normal appearance.  Neurological:     Mental Status: She is alert.    Review of Systems  All other  systems reviewed and are negative.  Lab Results  Component Value Date   HGBA1C 7.9 (A) 06/29/2018   Vitals:   07/28/18 1351  BP: (!) 148/78    Lipid Panel     Component Value Date/Time   CHOL 128 07/29/2017 1405   TRIG 111 07/29/2017 1405   HDL 48 07/29/2017 1405   CHOLHDL 2.7 07/29/2017 1405   LDLCALC 58 07/29/2017 1405    7 day average: 175 17 day average 170   Clinical ASCVD: No    A/P: Diabetes currently uncontrolled, but improved per SMBG results. Renal function prevents initiation of SGLT2 therapy. Will further optimize insulin.  - Increase Lantus to 30 units daily - Continue Victoza 1.8 mg once daily  -Counseled on s/sx of and management of hypoglycemia -Next A1C anticipated 09/27/2018    Hypertension currently uncontrolled.  BP goal <130/80 mmHg due to CKD.   - Increase metoprolol succinate to 100 mg daily.  - Continue losartan 100 mg daily.   ASCVD risk - primary prevention in patient with DM. Last LDL is controlled on high intensity statin therapy - Continue rosuvastatin 40 mg daily    Written patient instructions provided.  Total time in face to face counseling 45 minutes.   Follow up Pharmacist Clinic Visit in 4 weeks.   Patient seen with Laretta Bolster PharmD Candidate, Vertis Kelch, PharmD, PGY-1 resident. and Catie Darnelle Maffucci, PharmD,  PGY2 Pharmacy Resident.

## 2018-07-28 NOTE — Assessment & Plan Note (Signed)
Hypertension currently uncontrolled.  BP goal <130/80 mmHg due to CKD.   - Increase metoprolol succinate to 100 mg daily.  - Continue losartan 100 mg daily.

## 2018-07-28 NOTE — Assessment & Plan Note (Signed)
Diabetes currently uncontrolled, but improved per SMBG results. Renal function prevents initiation of SGLT2 therapy. Will further optimize insulin.  - Increase Lantus to 30 units daily - Continue Victoza 1.8 mg once daily  -Counseled on s/sx of and management of hypoglycemia -Next A1C anticipated 09/27/2018

## 2018-07-28 NOTE — Patient Instructions (Addendum)
It was great to see you today!  We are going to make 2 changes today:  1) Increase Lantus to 30 units daily. If you start to feel low blood sugars, you can decrease to 28 units daily.  2) Increase metoprolol to 100 mg (2 tablets daily). We will send in a new prescription for this.   Try to increase the amount of vegetables you are eating, and reduce carbohydrates. Frozen vegetables are the best, so try to fill up your plate with this. Frozen are lower in salt than canned foods.    Schedule follow up with Pharmacy in 4-5 weeks.

## 2018-07-29 NOTE — Progress Notes (Signed)
Patient ID: Michelle Abbott, female   DOB: 1945-09-30, 73 y.o.   MRN: [id number] Reviewed: Agree with Dr. Graylin Shiver documentation and management.

## 2018-09-23 ENCOUNTER — Other Ambulatory Visit: Payer: Self-pay | Admitting: Family Medicine

## 2018-10-04 ENCOUNTER — Other Ambulatory Visit: Payer: Self-pay | Admitting: Family Medicine

## 2018-11-03 ENCOUNTER — Ambulatory Visit (INDEPENDENT_AMBULATORY_CARE_PROVIDER_SITE_OTHER): Payer: Medicare Other | Admitting: Pharmacist

## 2018-11-03 ENCOUNTER — Ambulatory Visit (INDEPENDENT_AMBULATORY_CARE_PROVIDER_SITE_OTHER): Payer: Medicare Other | Admitting: Family Medicine

## 2018-11-03 ENCOUNTER — Encounter: Payer: Self-pay | Admitting: Pharmacist

## 2018-11-03 ENCOUNTER — Other Ambulatory Visit: Payer: Self-pay

## 2018-11-03 DIAGNOSIS — N183 Chronic kidney disease, stage 3 unspecified: Secondary | ICD-10-CM

## 2018-11-03 DIAGNOSIS — I10 Essential (primary) hypertension: Secondary | ICD-10-CM | POA: Diagnosis not present

## 2018-11-03 DIAGNOSIS — E1140 Type 2 diabetes mellitus with diabetic neuropathy, unspecified: Secondary | ICD-10-CM

## 2018-11-03 DIAGNOSIS — M1A0710 Idiopathic chronic gout, right ankle and foot, without tophus (tophi): Secondary | ICD-10-CM

## 2018-11-03 DIAGNOSIS — Z794 Long term (current) use of insulin: Secondary | ICD-10-CM

## 2018-11-03 LAB — POCT GLYCOSYLATED HEMOGLOBIN (HGB A1C): HbA1c, POC (controlled diabetic range): 9.8 % — AB (ref 0.0–7.0)

## 2018-11-03 MED ORDER — LANTUS SOLOSTAR 100 UNIT/ML ~~LOC~~ SOPN: 36.0000 [IU] | PEN_INJECTOR | Freq: Every day | SUBCUTANEOUS | 3 refills | Status: DC

## 2018-11-03 NOTE — Progress Notes (Signed)
   Subjective:    Patient ID: Michelle Abbott, female    DOB: July 27, 1945, 73 y.o.   MRN: [id number]   CC: follow up DM  HPI: spoke with Dr. Valentina Lucks and pharmacy team prior to meeting with me, her A1C has worsened to 9.8. she has a lot of carb intake in her diet. We increased her lantus to 36 units with plan to increase to 38 if sugars still high and patient is able to verbalize this back to me. She denies increased thirst, hunger, urination. She is tolerating victoza well.   She denies recent gout issues. She takes allopurinol.   Smoking status reviewed- former smoker  Review of Systems- see HPI   Objective:  There were no vitals taken for this visit. Vitals and nursing note reviewed  General: well nourished, in no acute distress HEENT: normocephalic, MMM Cardiac: RRR, clear S1 and S2, no murmurs, rubs, or gallops Respiratory: clear to auscultation bilaterally, no increased work of breathing Extremities: no edema or cyanosis Neuro: alert and oriented, no focal deficits   Assessment & Plan:    CKD (chronic kidney disease), stage III (HCC) Check BMP today  Type 2 diabetes mellitus with diabetic neuropathy, with long-term current use of insulin (HCC) Increase lantus to 36 units, go up to 38 if fasting CBG persistently >150 Continue victoza   Gout  Well controlled on allopurinol. Also on chlorthalidone but doing well. Will check uric acid level today.     Return in about 3 months (around 02/03/2019).   Lucila Maine, DO Family Medicine Resident PGY-3

## 2018-11-03 NOTE — Progress Notes (Signed)
Patient ID: Michelle Abbott, female   DOB: [birth year], 73 y.o.   MRN: [id number] Reviewed: Agree with Dr. Graylin Shiver documentation and management.

## 2018-11-03 NOTE — Patient Instructions (Addendum)
  Great seeing you today! It's been a pleasure taking care of you and I'll miss you after graduating!!  We'll check labs today and go from there.  Increase lantus to 36 units, if fasting suagrs are still 150 or higher go up to 38 units.  Continue victoza  We might want to add another medication for diabetes, but I'd like to see your kidney levels first and talk with your kidney doctor, so we'll wait for these labs to come back.  If you have questions or concerns please do not hesitate to call at [phone].  Lucila Maine, DO PGY-3, [facility] 11/03/2018 2:49 PM `

## 2018-11-03 NOTE — Patient Instructions (Addendum)
It was great to see to see you today!  Go ahead and increase Lantus to 36 units daily. If, after about a week, your fasting sugars continue to be greater than 150, feel free to increase your insulin by 2 more units to 38 units.   Schedule an appointment with Dr. Valentina Lucks in about a month.

## 2018-11-03 NOTE — Progress Notes (Signed)
    S:     Chief Complaint  Patient presents with  . Medication Management    diabetes    Patient arrives in fair spirits, ambulating without assistance.  Presents for diabetes evaluation, education, and management at the request of PCP (Dr. Vanetta Shawl). Referred on 07/01/18.  Patient reports adherence with medications.  Current diabetes medications include: Lantus (insulin glargine) 32 units daily, Victoza (liraglutide) 1.8 mg daily Current hypertension medications include: losartan 100 mg daily, metoprolol XL 100 mg daily, chlorthalidone 25 mg daily  Current hyperlipidemia medications include: rosuvastatin 40 mg daily  Patient denies hypoglycemic events. Patient reported dietary habits: Eats 1 meals/day Patient reports nocturia at least 3 times a day. Patient denies neuropathy. Patient denies visual changes.   She reports cold intolerance. She will be seen by Dr. Vanetta Shawl.  O:  Physical Exam Vitals signs reviewed.  Neurological:     Mental Status: She is alert.  Psychiatric:        Mood and Affect: Mood normal.      Review of Systems  Constitutional: Positive for chills.  Endo/Heme/Allergies:       Reports feeling "cold" frequently. This is new and different.  All other systems reviewed and are negative.    Lab Results  Component Value Date   HGBA1C 7.9 (A) 06/29/2018   Vitals:   11/03/18 1349  BP: 140/82    Lipid Panel     Component Value Date/Time   CHOL 128 07/29/2017 1405   TRIG 111 07/29/2017 1405   HDL 48 07/29/2017 1405   CHOLHDL 2.7 07/29/2017 1405   LDLCALC 58 07/29/2017 1405    Home fasting CBG: 196  Clinical ASCVD: No   A/P: Diabetes longstanding and currently uncontrolled. Patient is adherent with medication.  -Increased dose of Lantus from 32 units to 36 units. Instructed patient to increase by 2 units if >150 after one week.  -Extensively discussed pathophysiology of DM, recommended lifestyle interventions, dietary effects on glycemic  control  -Counseled on s/sx of and management of hypoglycemia -Next A1C anticipated today.   ASCVD risk - primary prevention in patient with DM. Last lipid panel was on 07/29/17. Patient has DM and CKD. High intensity statin indicated. Aspirin is indicated.  -Continued aspirin 81 mg  -Continued rosuvastatin 40 mg.   History of Gout. Last "gout attack" ~ 3 years ago.  Since that time she has made dietary changes and has not had any symptoms.  Currently taking allopurinol.  Also taking Chlorthalidone.  Consider checking uric acid at next blood draw for potential adjustment in therapy.    BP today 140/82. BP goal <130/80 mmHg. Patient is adherent with medication.   Written patient instructions provided.  Total time in face to face counseling 30 minutes.   Follow up Pharmacist/PCP Visit in 1 month.   Patient seen with Evangeline Gula PharmD Candidate and Catie Darnelle Maffucci, PharmD, PGY2 Pharmacy Resident

## 2018-11-03 NOTE — Assessment & Plan Note (Signed)
History of Gout. Last "gout attack" ~ 3 years ago.  Since that time she has made dietary changes and has not had any symptoms.  Currently taking allopurinol.  Also taking Chlorthalidone.  Consider checking uric acid at next blood draw for potential adjustment in therapy.

## 2018-11-03 NOTE — Assessment & Plan Note (Signed)
Diabetes longstanding and currently uncontrolled. Patient is adherent with medication.  -Increased dose of Lantus from 32 units to 36 units. Instructed patient to increase by 2 units if >150 after one week.  -Extensively discussed pathophysiology of DM, recommended lifestyle interventions, dietary effects on glycemic control  -Counseled on s/sx of and management of hypoglycemia -Next A1C anticipated today.

## 2018-11-04 LAB — BASIC METABOLIC PANEL
BUN/Creatinine Ratio: 15 (ref 12–28)
BUN: 33 mg/dL — ABNORMAL HIGH (ref 8–27)
CO2: 25 mmol/L (ref 20–29)
Calcium: 9.6 mg/dL (ref 8.7–10.3)
Chloride: 102 mmol/L (ref 96–106)
Creatinine, Ser: 2.17 mg/dL — ABNORMAL HIGH (ref 0.57–1.00)
GFR calc Af Amer: 26 mL/min/{1.73_m2} — ABNORMAL LOW (ref >59)
GFR calc non Af Amer: 22 mL/min/{1.73_m2} — ABNORMAL LOW (ref >59)
Glucose: 223 mg/dL — ABNORMAL HIGH (ref 65–99)
Potassium: 4.1 mmol/L (ref 3.5–5.2)
Sodium: 141 mmol/L (ref 134–144)

## 2018-11-04 LAB — URIC ACID: Uric Acid: 6.4 mg/dL (ref 2.5–7.1)

## 2018-11-04 NOTE — Assessment & Plan Note (Signed)
  Well controlled on allopurinol. Also on chlorthalidone but doing well. Will check uric acid level today.

## 2018-11-04 NOTE — Assessment & Plan Note (Signed)
Increase lantus to 36 units, go up to 38 if fasting CBG persistently >150 Continue victoza

## 2018-11-04 NOTE — Assessment & Plan Note (Signed)
Check BMP today 

## 2018-11-08 ENCOUNTER — Telehealth: Payer: Self-pay | Admitting: Licensed Clinical Social Worker

## 2018-11-08 NOTE — Telephone Encounter (Signed)
Care Coordination  Telephone Outreach Note  11/08/2018 Name: Michelle Abbott MRN: [id number] DOB: 1945-06-07  Referred by: Dr. Valentina Lucks patient;  Reason for referral : [employer] (food resources) Patient has not received food box delivery.  Called [employer] and spoke to Michelle Abbott who confirmed patient is on the delivery route.  She confirmed patient's address but did not have correct apartment number.  She will deliver food to patient this week. Will call patient to confirm correct address.  Phone call to Ms. Michelle Abbott reference assessing for the above referral. Patient reports she has not received food box delivery explained food has been delivered to wrong apartment. Patient's food stamps have increase due to covid-19 so she has been able to purchase more food.  Reports not running out of food but the food box will help supplement what she has.   Intervention:Client interviewed and appropriate assessments performed. Review of patient's consultants notes from appropriate care team members was performed as part of care coordination referral. This also includes locating resources and coordinating care for patient.   Plan: LCSW will call in about 5 to 7 business days to see if patient received food.  Casimer Lanius, LCSW [facility]   [phone] 2:57 PM

## 2018-11-22 DIAGNOSIS — D631 Anemia in chronic kidney disease: Secondary | ICD-10-CM | POA: Diagnosis not present

## 2018-11-22 DIAGNOSIS — E785 Hyperlipidemia, unspecified: Secondary | ICD-10-CM | POA: Diagnosis not present

## 2018-11-22 DIAGNOSIS — E1122 Type 2 diabetes mellitus with diabetic chronic kidney disease: Secondary | ICD-10-CM | POA: Diagnosis not present

## 2018-11-22 DIAGNOSIS — N184 Chronic kidney disease, stage 4 (severe): Secondary | ICD-10-CM | POA: Diagnosis not present

## 2018-11-22 DIAGNOSIS — N2581 Secondary hyperparathyroidism of renal origin: Secondary | ICD-10-CM | POA: Diagnosis not present

## 2018-11-22 DIAGNOSIS — M109 Gout, unspecified: Secondary | ICD-10-CM | POA: Diagnosis not present

## 2018-11-22 DIAGNOSIS — I129 Hypertensive chronic kidney disease with stage 1 through stage 4 chronic kidney disease, or unspecified chronic kidney disease: Secondary | ICD-10-CM | POA: Diagnosis not present

## 2018-11-22 DIAGNOSIS — N189 Chronic kidney disease, unspecified: Secondary | ICD-10-CM | POA: Diagnosis not present

## 2018-11-25 NOTE — Telephone Encounter (Addendum)
Care Coordination  Telephone Outreach Note  11/25/2018 Name: Michelle Abbott MRN: [id number] DOB: 17-Nov-1945  Referred by: Dr. Valentina Lucks  Reason for referral : [employer] (food resources) F/U Phone call to Ms. Michelle Abbott  To see if she has receive her foodbox from [employer].  Patient register for food program however food was being delivered to the wrong apartment.  Patient confirmed that she received her foodbox last week and is on the schedule for monthly delivers. Patient is appreciate of the assistance.  No additional LCSW services needed at this time.  Update provided to Dr. Valentina Lucks.  Casimer Lanius, [place]   [phone] 12:17 PM

## 2018-12-05 ENCOUNTER — Other Ambulatory Visit: Payer: Self-pay | Admitting: Family Medicine

## 2018-12-05 DIAGNOSIS — Z794 Long term (current) use of insulin: Secondary | ICD-10-CM

## 2018-12-05 DIAGNOSIS — E1140 Type 2 diabetes mellitus with diabetic neuropathy, unspecified: Secondary | ICD-10-CM

## 2019-02-02 ENCOUNTER — Other Ambulatory Visit: Payer: Self-pay

## 2019-02-02 ENCOUNTER — Other Ambulatory Visit: Payer: Self-pay | Admitting: *Deleted

## 2019-02-02 ENCOUNTER — Telehealth (INDEPENDENT_AMBULATORY_CARE_PROVIDER_SITE_OTHER): Payer: Medicare Other | Admitting: Family Medicine

## 2019-02-02 DIAGNOSIS — N390 Urinary tract infection, site not specified: Secondary | ICD-10-CM

## 2019-02-02 HISTORY — DX: Urinary tract infection, site not specified: N39.0

## 2019-02-02 MED ORDER — SULFAMETHOXAZOLE-TRIMETHOPRIM 800-160 MG PO TABS: 1.0000 | ORAL_TABLET | Freq: Two times a day (BID) | ORAL | 0 refills | Status: DC

## 2019-02-02 NOTE — Progress Notes (Signed)
[place] Telemedicine Visit I connected with  Michelle Abbott on 02/02/19 by a video enabled telemedicine application and verified that I am speaking with the correct person using two identifiers.   I discussed the limitations of evaluation and management by telemedicine. The patient expressed understanding and agreed to proceed.  Patient consented to have virtual visit. Method of visit: Telephone  Encounter participants: Patient: Michelle Abbott - located at Home Provider: Caroline More - located at [facility] Others (if applicable): None  Chief Complaint: UTI concerns  HPI: Concern for UTI Patient reports she thinks she has a UTI. Reports that urine is cloudy and she has dull ache after urinating. Denies vaginal itching. Has some vaginal discharge last week but this resolved. Reports urinary frequency. Reports urinary urgency. Reports worsening back pain. Denies fever, but sometimes feels warm. Feels more tired.   ROS: per HPI  Pertinent PMHx: T2DM, CKD stage III   Exam:  Respiratory: speaking full sentences, no increased WOB  Assessment/Plan:  UTI (urinary tract infection) Patient with complicated UTI given symptoms of back pain.  Concerned that there might be some pyelonephritis.  Patient is allergic to fluoroquinolones.  Will give Rx for Bactrim. Per up-to-date since patient's creatinine clearance is above 30 she does not need a dose adjustment.  Patient's creatinine clearance calculated to be 45 using lab results from 3 months ago.  Strict return precautions given.  Advised that patient follow-up if no improvement in 1 week.  Advised that she go to the emergency room with worsening back pain or fever.    Time spent during visit with patient: 15 minutes

## 2019-02-02 NOTE — Assessment & Plan Note (Signed)
Patient with complicated UTI given symptoms of back pain.  Concerned that there might be some pyelonephritis.  Patient is allergic to fluoroquinolones.  Will give Rx for Bactrim. Per up-to-date since patient's creatinine clearance is above 30 she does not need a dose adjustment.  Patient's creatinine clearance calculated to be 45 using lab results from 3 months ago.  Strict return precautions given.  Advised that patient follow-up if no improvement in 1 week.  Advised that she go to the emergency room with worsening back pain or fever.

## 2019-02-03 MED ORDER — ALLOPURINOL 100 MG PO TABS: 100.0000 mg | ORAL_TABLET | Freq: Every day | ORAL | 3 refills | Status: DC

## 2019-02-10 DIAGNOSIS — E083212 Diabetes mellitus due to underlying condition with mild nonproliferative diabetic retinopathy with macular edema, left eye: Secondary | ICD-10-CM | POA: Diagnosis not present

## 2019-02-27 DIAGNOSIS — E113312 Type 2 diabetes mellitus with moderate nonproliferative diabetic retinopathy with macular edema, left eye: Secondary | ICD-10-CM | POA: Diagnosis not present

## 2019-02-27 DIAGNOSIS — H3563 Retinal hemorrhage, bilateral: Secondary | ICD-10-CM | POA: Diagnosis not present

## 2019-02-27 DIAGNOSIS — E113391 Type 2 diabetes mellitus with moderate nonproliferative diabetic retinopathy without macular edema, right eye: Secondary | ICD-10-CM | POA: Diagnosis not present

## 2019-02-27 DIAGNOSIS — H43823 Vitreomacular adhesion, bilateral: Secondary | ICD-10-CM | POA: Diagnosis not present

## 2019-03-08 ENCOUNTER — Other Ambulatory Visit: Payer: Self-pay

## 2019-03-09 ENCOUNTER — Ambulatory Visit (INDEPENDENT_AMBULATORY_CARE_PROVIDER_SITE_OTHER): Payer: Medicare Other | Admitting: Pharmacist

## 2019-03-09 ENCOUNTER — Ambulatory Visit (INDEPENDENT_AMBULATORY_CARE_PROVIDER_SITE_OTHER): Payer: Medicare Other | Admitting: Family Medicine

## 2019-03-09 ENCOUNTER — Other Ambulatory Visit: Payer: Self-pay

## 2019-03-09 ENCOUNTER — Encounter: Payer: Self-pay | Admitting: Pharmacist

## 2019-03-09 VITALS — BP 135/84 | HR 74 | Ht 63.0 in | Wt 246.0 lb

## 2019-03-09 VITALS — BP 135/84 | HR 74 | Wt 246.0 lb

## 2019-03-09 DIAGNOSIS — I10 Essential (primary) hypertension: Secondary | ICD-10-CM

## 2019-03-09 DIAGNOSIS — Z794 Long term (current) use of insulin: Secondary | ICD-10-CM

## 2019-03-09 DIAGNOSIS — H900 Conductive hearing loss, bilateral: Secondary | ICD-10-CM

## 2019-03-09 DIAGNOSIS — E1140 Type 2 diabetes mellitus with diabetic neuropathy, unspecified: Secondary | ICD-10-CM

## 2019-03-09 DIAGNOSIS — N183 Chronic kidney disease, stage 3 unspecified: Secondary | ICD-10-CM

## 2019-03-09 DIAGNOSIS — Z23 Encounter for immunization: Secondary | ICD-10-CM

## 2019-03-09 DIAGNOSIS — E782 Mixed hyperlipidemia: Secondary | ICD-10-CM | POA: Diagnosis not present

## 2019-03-09 DIAGNOSIS — M1A0710 Idiopathic chronic gout, right ankle and foot, without tophus (tophi): Secondary | ICD-10-CM

## 2019-03-09 DIAGNOSIS — M5431 Sciatica, right side: Secondary | ICD-10-CM | POA: Diagnosis not present

## 2019-03-09 DIAGNOSIS — M461 Sacroiliitis, not elsewhere classified: Secondary | ICD-10-CM

## 2019-03-09 LAB — POCT GLYCOSYLATED HEMOGLOBIN (HGB A1C): HbA1c, POC (controlled diabetic range): 9.5 % — AB (ref 0.0–7.0)

## 2019-03-09 MED ORDER — AMLODIPINE BESYLATE 5 MG PO TABS: 5.0000 mg | ORAL_TABLET | Freq: Every day | ORAL | 3 refills | Status: DC

## 2019-03-09 MED ORDER — COLCHICINE 0.6 MG PO TABS: 0.6000 mg | ORAL_TABLET | ORAL | 0 refills | Status: DC | PRN

## 2019-03-09 NOTE — Assessment & Plan Note (Signed)
Hypertension longstanding, currently uncontrolled. BP goal = 130/80 mmHg. Patient is adherent with medication. Control is suboptimal possibly due to diet (salt intake). It appears chlorthalidone use may be contributing to recent gout flare. Patient's last uric acid level was >6 (10/2018).  -Discontinue chlorthalidone. -Initiate amlodipine 5 mg daily. There is trace edema noted mainly in patient's feet, will continue to monitor if swelling worsens. -Continue losartan 100 mg daily and metoprolol XL 100 mg daily.

## 2019-03-09 NOTE — Assessment & Plan Note (Addendum)
  Diabetes longstanding since 2001, currently uncontrolled. Patient is adherent with medication. Control is suboptimal due to diet and lack of exercise. - Increased basal insulin Lantus (insulin glargine) from 36 units daily to 40 units daily.  -Continue GLP-1 Victoza (liraglutide) 1.8 mg daily.  -Extensively discussed pathophysiology of DM, recommended lifestyle interventions, dietary effects on glycemic control -Counseled on s/sx of and management of hypoglycemia -Next A1C anticipated 03/2019.

## 2019-03-09 NOTE — Assessment & Plan Note (Signed)
ASCVD risk - primary prevention in patient with DM. Last LDL is controlled. Cannot calculate ASCVD risk score due to total cholesterol and LDL levels. Moderate intensity statin indicated (at minimum). Aspirin is not indicated.  -Consider discontinuing aspirin 81 mg at future appointment.  -Continue rosuvastatin 40 mg.

## 2019-03-09 NOTE — Progress Notes (Signed)
Subjective:    Patient ID: Michelle Abbott, female    DOB: [birth year], 73 y.o.   MRN: [id number]   CC: annual exam  HPI: Pt here for check up for maintenance of chronic medical conditions.  DM: A1c obtained today. Pt saw Dr. Valentina Lucks and pharmacist team prior to this appt, insulin increased to 40U. Pt saw ophthalmologist last week, sent to retina specialist as she has diabetic retinopathy. She is to return for a f/u in 4 months. Diabetic foot exam to be completed today.  HTN: at goal today.  CKD, stage III: pt's previous serum cr baseline was 1.6, most recently in July she had cr of 2.16. Will repeat serum cr today. Pt has appt with nephrologist on 03/31/2019.  Gout: recent flare in last 3 weeks. Pt took colchicine and continues to take allopurinol. No current concern for flare. Last UA in July was 6.4  Hearing: pt notes that she doesn't hear as well as she used to and describes the sound in her ears not as ringing, but as like listening to a seashell. Hearing screen completed.  Concern for collar bone: pt believes her R "neck bone" is higher and not symmetrical with her L side. She states that this does not cause her any pain, but she is curious about if this is normal.  Back pain: Pt has R hip arthritis and known sciatica, but describes worsening pain in her R lower back. She has done PT before, but many years ago. She states that sometimes she feels as if she is leaning to compensate, but has not fallen, does not cruise to stand up, and has no had trouble getting around other than discomfort. She reports pain and tingling that go down her leg and decrease in flexibility. She has no incontinence or saddle anesthesia.  Smoking status reviewed   ROS: pertinent noted in the HPI   Past medical history, surgical, family, and social history reviewed and updated in the EMR as appropriate.  Objective:  BP 135/84   Pulse 74   Wt 246 lb (111.6 kg)   SpO2 97%   BMI 43.58 kg/m   Vitals and  nursing note reviewed  General: NAD, pleasant, able to participate in exam Ears: IAC are clear bilaterally, TMs intact and in neutral position, no exudates, no cerumen, no erythema or swelling. Rinne AC>BC, weber symmetric to both ears Cardiac: RRR, S1 S2 present. normal heart sounds, no murmurs. Respiratory: CTAB, normal effort, No wheezes, rales or rhonchi Abdominal: soft, NT, ND, normal bowel sounds + Extremities: no edema or cyanosis. Lumbar spine: paraspinal muscle tone is symmetric, no tenderness to palpation of lumbar spine or paraspinal muscles RLE: ROM for hip intact for flexion but limited in flexibility for hypermobility in comparison to LLE, FADIR (-), FABER (+), tenderness to palpation over R SI joint, strength 5/5, sensation intact. Skin: warm and dry, no rashes noted Neuro: alert, no obvious focal deficits Psych: Normal affect and mood  Diabetic Foot Exam - Simple   Simple Foot Form Diabetic Foot exam was performed with the following findings: Yes 03/09/2019 10:05 AM  Visual Inspection No deformities, no ulcerations, no other skin breakdown bilaterally: Yes Sensation Testing Intact to touch and monofilament testing bilaterally: Yes Pulse Check Posterior Tibialis and Dorsalis pulse intact bilaterally: Yes Comments      Assessment & Plan:  [name] is a 73 yo woman with HTN, DM, gout, R hip arthritis, inflammation of R SI joint, and hearing loss.  Gout Recent gout  flare in last 3 weeks. Agree with pharmacy team to d/c chlorthalidone and switch to amlodipine. Will repeat UA at next follow up appt. Still using allopurinol and colchicine.  CKD (chronic kidney disease), stage III (HCC) BMP obtained today. Pt to see nephrologist next month.  Type 2 diabetes mellitus with diabetic neuropathy, with long-term current use of insulin (HCC) Lipid panel, A1C obtained today, minimally decreased to 9.5 since last check. Agree with pharmacy team that uncontrolled DM is due to  pt's lack of a diet and exercise regimen, as she is likely compliant with medication. Insulin increased to 40U by pharmacy team.  - Discussed extensively with pt that diet and exercise are key components to diabetes management. She is in the precontemplative stage of behavior change at this time and declines further interventions such as referral to a nutritionist. - Foot exam completed, no problems noted at this time  Essential hypertension Pt is close to goal, but not there yet with 3 medications on board. Goal <130/80, currently 135/84 today. Agree with pharmacy team to d/c chlorthalidone and start amlodipine 5 mg qd due to recent gout flare. - continue metoprolol XL 100 mg, losartan 100 mg qd  Hearing loss Hearing screen demonstrates that pt primarly hears 40db. Likely age related change, will discuss options such as referral to audiology/ENT at next visit.  SI (sacroiliac) joint inflammation (HCC) Pt has pain over R SI joint and physical exam reveals pain in R SI joint. Pt has known arthritis in R hip as well. - referral to PT for treatment - recommended pt work on diet and exercise for DM as well. Discussed setting small, measurable, achievable, realistic goals such as losing 5% of body weight through small changes such as regular walking regimens and quitting soda, and that each 1lb lost is the equivalent of 4lbs of pressure on joints  Need for immunization against influenza Flu shot administered today.  Clavicle concern: pt's clavicle is appropriate an symmetric, could not appreciate any problems and pt denies any pain or functioning concerns. Educated pt on variations in normal anatomy and reassured her that her anatomy is normal and not concerning.  Michelle Damme, MD [place] PGY-1

## 2019-03-09 NOTE — Patient Instructions (Signed)
It was a pleasure to meet you!  We talked about many things today including:  1. Your collar bone (clavicle) is normal and the same on both sides.  2. We did a hearing screen today, your ears appear normal on exam.  3. I will send you to physical therapy for your R hip and SI (sacroiliac joint).  4. Keep up the good work on your diabetic diet! If you would like to see a dietician please let me know.  Follow up in 1 month.   Be Well,  Dr. Chauncey Reading   Sacroiliac Joint Dysfunction  Sacroiliac joint dysfunction is a condition that causes inflammation on one or both sides of the sacroiliac (SI) joint. The SI joint connects the lower part of the spine (sacrum) with the two upper portions of the pelvis (ilium). This condition causes deep aching or burning pain in the low back. In some cases, the pain may also spread into one or both buttocks, hips, or thighs. What are the causes? This condition may be caused by:  Pregnancy. During pregnancy, extra stress is put on the SI joints because the pelvis widens.  Injury, such as: ? Injuries from car accidents. ? Sports-related injuries. ? Work-related injuries.  Having one leg that is shorter than the other.  Conditions that affect the joints, such as: ? Rheumatoid arthritis. ? Gout. ? Psoriatic arthritis. ? Joint infection (septic arthritis). Sometimes, the cause of SI joint dysfunction is not known. What are the signs or symptoms? Symptoms of this condition include:  Aching or burning pain in the lower back. The pain may also spread to other areas, such as: ? Buttocks. ? Groin. ? Thighs.  Muscle spasms in or around the painful areas.  Increased pain when standing, walking, running, stair climbing, bending, or lifting. How is this diagnosed? This condition is diagnosed with a physical exam and medical history. During the exam, the health care provider may move one or both of your legs to different positions to check for pain.  Various tests may be done to confirm the diagnosis, including:  Imaging tests to look for other causes of pain. These may include: ? MRI. ? CT scan. ? Bone scan.  Diagnostic injection. A numbing medicine is injected into the SI joint using a needle. If your pain is temporarily improved or stopped after the injection, this can indicate that SI joint dysfunction is the problem. How is this treated? Treatment depends on the cause and severity of your condition. Treatment options may include:  Ice or heat applied to the lower back area after an injury. This may help reduce pain and muscle spasms.  Medicines to relieve pain or inflammation or to relax the muscles.  Wearing a back brace (sacroiliac brace) to help support the joint while your back is healing.  Physical therapy to increase muscle strength around the joint and flexibility at the joint. This may also involve learning proper body positions and ways of moving to relieve stress on the joint.  Direct manipulation of the SI joint.  Injections of steroid medicine into the joint to reduce pain and swelling.  Radiofrequency ablation to burn away nerves that are carrying pain messages from the joint.  Use of a device that provides electrical stimulation to help reduce pain at the joint.  Surgery to put in screws and plates that limit or prevent joint motion. This is rare. Follow these instructions at home: Medicines  Take over-the-counter and prescription medicines only as told by your health  care provider.  Do not drive or use heavy machinery while taking prescription pain medicine.  If you are taking prescription pain medicine, take actions to prevent or treat constipation. Your health care provider may recommend that you: ? Drink enough fluid to keep your urine pale yellow. ? Eat foods that are high in fiber, such as fresh fruits and vegetables, whole grains, and beans. ? Limit foods that are high in fat and processed sugars,  such as fried or sweet foods. ? Take an over-the-counter or prescription medicine for constipation. If you have a brace:  Wear the brace as told by your health care provider. Remove it only as told by your health care provider.  Keep the brace clean.  If the brace is not waterproof: ? Do not let it get wet. ? Cover it with a watertight covering when you take a bath or a shower. Managing pain, stiffness, and swelling      Icing can help with pain and swelling. Heat may help with muscle tension or spasms. Ask your health care provider if you should use ice or heat.  If directed, put ice on the affected area: ? If you have a removable brace, remove it as told by your health care provider. ? Put ice in a plastic bag. ? Place a towel between your skin and the bag. ? Leave the ice on for 20 minutes, 2-3 times a day.  If directed, apply heat to the affected area. Use the heat source that your health care provider recommends, such as a moist heat pack or a heating pad. ? Place a towel between your skin and the heat source. ? Leave the heat on for 20-30 minutes. ? Remove the heat if your skin turns bright red. This is especially important if you are unable to feel pain, heat, or cold. You may have a greater risk of getting burned. General instructions  Rest as needed. Ask your health care provider what activities are safe for you.  Return to your normal activities as told by your health care provider.  Exercise as directed by your health care provider or physical therapist.  Do not use any products that contain nicotine or tobacco, such as cigarettes and e-cigarettes. These can delay bone healing. If you need help quitting, ask your health care provider.  Keep all follow-up visits as told by your health care provider. This is important. Contact a health care provider if:  Your pain is not controlled with medicine.  You have a fever.  Your pain is getting worse. Get help right  away if:  You have weakness, numbness, or tingling in your legs or feet.  You lose control of your bladder or bowel. Summary  Sacroiliac joint dysfunction is a condition that causes inflammation on one or both sides of the sacroiliac (SI) joint.  This condition causes deep aching or burning pain in the low back. In some cases, the pain may also spread into one or both buttocks, hips, or thighs.  Treatment depends on the cause and severity of your condition. It may include medicines to reduce pain and swelling or to relax muscles. This information is not intended to replace advice given to you by your health care provider. Make sure you discuss any questions you have with your health care provider. Document Released: 07/31/2008 Document Revised: 12/29/2017 Document Reviewed: 06/14/2017 Elsevier Patient Education  2020 [employer].

## 2019-03-09 NOTE — Progress Notes (Signed)
Reviewed: Agree with Dr. Koval's documentation and management. 

## 2019-03-09 NOTE — Patient Instructions (Addendum)
Great to see you today.   Increase Lantus from 36 to 40 units once daily in the morning.  Please contact us if your blood glucose readings are less than 90 [phone])  Continue chlorthalidone until amlodipine arrives.  Please contact us if the swelling in your legs gets worse [phone]  Follow-up with [place] in the Rx clinic in 4 weeks.

## 2019-03-09 NOTE — Progress Notes (Signed)
S:     Chief Complaint  Patient presents with  . Medication Management    DM    Patient arrives in good spirits ambulating without assistance.  Presents for diabetes evaluation, education, and management Patient's PCP is Dr. Chauncey Reading. Patient was referred and last seen by Dr. Vanetta Shawl on 11/04/18.   Patient states she recently had UTI, which has resolved. She feels BG readings have improved since UTI resolution.   Patient reports Diabetes was diagnosed in 2001.  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  Patient reports adherence with medications.  Current diabetes medications include: Victoza 1.8 mg daily, Lantus 36 units daily in the morning (will adjust to 32-34 units if she has a BG <130). Current hypertension medications include: Chlorthalidone 25 mg daily, losartan 100 mg daily, metoprolol 100 mg daily. Current hyperlipidemia medications include: Rosuvastatin 40 mg daily.  Patient denies hypoglycemic events.  Patient reported dietary habits: Eats 1 meals/day Lunch (~2-3pm): eggs, bacon, "sporadic" Snacks (8:30-9:30pm): crackers "nabs", popcorn, fruit (cuties mandarin oranges, pears), canned peaches, ice cream, donuts. -Cut out potato chips Drinks: hot tea (adds a lot of sugar (1 sweet&low and 2 tsp of sugar)), occasionally coffee, simply lemonade and tropicana juice. -Cut out "hi-c" and soda  Patient-reported exercise habits: chores around house/going to the mailbox    Patient reports nocturia.  Patient reports neuropathy seldomly (previously on gabapentin) Patient reports visual changes. She states she has problems in her left eye (was told by her doctor it was caused by DM). If it starts, leaking she states that the doctor will give her an injection in her eye. She is followed by a retinal specialist. She states she has not had any changes in her vision. States that her doctor said it is not macular degeneration. Patient reports self foot  exams.     O:  Physical Exam Constitutional:      Appearance: Normal appearance.  Neurological:     Mental Status: She is alert.     Comments: Patient reports feeling "off-balance"  Psychiatric:        Mood and Affect: Mood normal.        Behavior: Behavior normal.        Thought Content: Thought content normal.        Judgment: Judgment normal.    Review of Systems  Cardiovascular: Positive for leg swelling.       Patient is noted to have slight baseline edema in her feet likely due to CKD, none noted near her tibia or ankles.  All other systems reviewed and are negative.    Lab Results  Component Value Date   HGBA1C 9.8 (A) 11/03/2018   There were no vitals filed for this visit.  Lipid Panel     Component Value Date/Time   CHOL 128 07/29/2017 1405   TRIG 111 07/29/2017 1405   HDL 48 07/29/2017 1405   CHOLHDL 2.7 07/29/2017 1405   LDLCALC 58 07/29/2017 1405   Patient brought in her BG meter (checks fasting BG readings ~1x daily). 30 day avg 164 14 day avg 170 7 day avg 162 October readings: 132 176 169 220 - can attribute to eating a lot before bed  129 133 173 181 145 194 175 188 156 135  181 161 132 121  Clinical ASCVD: No  A/P: Diabetes longstanding since 2001, currently uncontrolled. Patient is adherent with medication. Control is suboptimal due to diet and lack of exercise. - Increased basal insulin Lantus (insulin glargine) from 36 units  daily to 40 units daily.  -Continue GLP-1 Victoza (liraglutide) 1.8 mg daily.  -Extensively discussed pathophysiology of DM, recommended lifestyle interventions, dietary effects on glycemic control -Counseled on s/sx of and management of hypoglycemia -Next A1C anticipated 03/2019.   ASCVD risk - primary prevention in patient with DM. Last LDL is controlled. Cannot calculate ASCVD risk score due to total cholesterol and LDL levels. Moderate intensity statin indicated (at minimum). Aspirin is not indicated.   -Consider discontinuing aspirin 81 mg at future appointment.  -Continue rosuvastatin 40 mg.   Hypertension longstanding, currently uncontrolled. BP goal = 130/80 mmHg. Patient is adherent with medication. Control is suboptimal possibly due to diet (salt intake). It appears chlorthalidone use may be contributing to recent gout flare. Patient's last uric acid level was >6 (10/2018).  -Discontinue chlorthalidone. -Initiate amlodipine 5 mg daily. There is trace edema noted mainly in patient's feet, will continue to monitor if swelling worsens. -Continue losartan 100 mg daily and metoprolol XL 100 mg daily.   Written patient instructions provided.  Total time in face to face counseling 60 minutes.   Follow up [facility] Visit in 1 month.  Patient seen with Donald Prose, PharmD Candidate and Drexel Iha, PharmD PGY-2 Resident.

## 2019-03-10 LAB — BASIC METABOLIC PANEL
BUN/Creatinine Ratio: 14 (ref 12–28)
BUN: 29 mg/dL — ABNORMAL HIGH (ref 8–27)
CO2: 25 mmol/L (ref 20–29)
Calcium: 9.5 mg/dL (ref 8.7–10.3)
Chloride: 107 mmol/L — ABNORMAL HIGH (ref 96–106)
Creatinine, Ser: 2.07 mg/dL — ABNORMAL HIGH (ref 0.57–1.00)
GFR calc Af Amer: 27 mL/min/{1.73_m2} — ABNORMAL LOW (ref >59)
GFR calc non Af Amer: 24 mL/min/{1.73_m2} — ABNORMAL LOW (ref >59)
Glucose: 127 mg/dL — ABNORMAL HIGH (ref 65–99)
Potassium: 4.1 mmol/L (ref 3.5–5.2)
Sodium: 143 mmol/L (ref 134–144)

## 2019-03-10 LAB — LIPID PANEL
Chol/HDL Ratio: 2.4 ratio (ref 0.0–4.4)
Cholesterol, Total: 131 mg/dL (ref 100–199)
HDL: 55 mg/dL (ref >39)
LDL Chol Calc (NIH): 55 mg/dL (ref 0–99)
Triglycerides: 121 mg/dL (ref 0–149)
VLDL Cholesterol Cal: 21 mg/dL (ref 5–40)

## 2019-03-12 ENCOUNTER — Encounter: Payer: Self-pay | Admitting: Family Medicine

## 2019-03-12 DIAGNOSIS — M461 Sacroiliitis, not elsewhere classified: Secondary | ICD-10-CM

## 2019-03-12 DIAGNOSIS — Z23 Encounter for immunization: Secondary | ICD-10-CM | POA: Insufficient documentation

## 2019-03-12 DIAGNOSIS — H9110 Presbycusis, unspecified ear: Secondary | ICD-10-CM | POA: Insufficient documentation

## 2019-03-12 DIAGNOSIS — H9190 Unspecified hearing loss, unspecified ear: Secondary | ICD-10-CM | POA: Insufficient documentation

## 2019-03-12 HISTORY — DX: Sacroiliitis, not elsewhere classified: M46.1

## 2019-03-12 NOTE — Assessment & Plan Note (Addendum)
Lipid panel, A1C obtained today, minimally decreased to 9.5 since last check. Agree with pharmacy team that uncontrolled DM is due to pt's lack of a diet and exercise regimen, as she is likely compliant with medication. Insulin increased to 40U by pharmacy team.  - Discussed extensively with pt that diet and exercise are key components to diabetes management. She is in the precontemplative stage of behavior change at this time and declines further interventions such as referral to a nutritionist. - Foot exam completed, no problems noted at this time

## 2019-03-12 NOTE — Assessment & Plan Note (Signed)
Recent gout flare in last 3 weeks. Agree with pharmacy team to d/c chlorthalidone and switch to amlodipine. Will repeat UA at next follow up appt. Still using allopurinol and colchicine.

## 2019-03-12 NOTE — Assessment & Plan Note (Signed)
Hearing screen demonstrates that pt primarly hears 40db. Likely age related change, will discuss options such as referral to audiology/ENT at next visit.

## 2019-03-12 NOTE — Assessment & Plan Note (Signed)
BMP obtained today. Pt to see nephrologist next month.

## 2019-03-12 NOTE — Assessment & Plan Note (Signed)
Pt has pain over R SI joint and physical exam reveals pain in R SI joint. Pt has known arthritis in R hip as well. - referral to PT for treatment - recommended pt work on diet and exercise for DM as well. Discussed setting small, measurable, achievable, realistic goals such as losing 5% of body weight through small changes such as regular walking regimens and quitting soda, and that each 1lb lost is the equivalent of 4lbs of pressure on joints

## 2019-03-12 NOTE — Assessment & Plan Note (Signed)
Flu shot administered today. 

## 2019-03-12 NOTE — Assessment & Plan Note (Signed)
Pt is close to goal, but not there yet with 3 medications on board. Goal <130/80, currently 135/84 today. Agree with pharmacy team to d/c chlorthalidone and start amlodipine 5 mg qd due to recent gout flare. - continue metoprolol XL 100 mg, losartan 100 mg qd

## 2019-03-22 ENCOUNTER — Other Ambulatory Visit: Payer: Self-pay | Admitting: *Deleted

## 2019-03-22 MED ORDER — BD PEN NEEDLE SHORT U/F 31G X 8 MM MISC: 3 refills | Status: DC

## 2019-04-04 ENCOUNTER — Other Ambulatory Visit: Payer: Self-pay | Admitting: *Deleted

## 2019-04-04 DIAGNOSIS — N189 Chronic kidney disease, unspecified: Secondary | ICD-10-CM | POA: Diagnosis not present

## 2019-04-04 DIAGNOSIS — D631 Anemia in chronic kidney disease: Secondary | ICD-10-CM | POA: Diagnosis not present

## 2019-04-04 DIAGNOSIS — N2581 Secondary hyperparathyroidism of renal origin: Secondary | ICD-10-CM | POA: Diagnosis not present

## 2019-04-04 DIAGNOSIS — I129 Hypertensive chronic kidney disease with stage 1 through stage 4 chronic kidney disease, or unspecified chronic kidney disease: Secondary | ICD-10-CM | POA: Diagnosis not present

## 2019-04-04 DIAGNOSIS — N184 Chronic kidney disease, stage 4 (severe): Secondary | ICD-10-CM | POA: Diagnosis not present

## 2019-04-04 DIAGNOSIS — E1122 Type 2 diabetes mellitus with diabetic chronic kidney disease: Secondary | ICD-10-CM | POA: Diagnosis not present

## 2019-04-05 MED ORDER — ROSUVASTATIN CALCIUM 40 MG PO TABS: 40.0000 mg | ORAL_TABLET | Freq: Every day | ORAL | 2 refills | Status: DC

## 2019-04-07 ENCOUNTER — Encounter: Payer: Self-pay | Admitting: Family Medicine

## 2019-04-07 ENCOUNTER — Ambulatory Visit (INDEPENDENT_AMBULATORY_CARE_PROVIDER_SITE_OTHER): Payer: Medicare Other | Admitting: Family Medicine

## 2019-04-07 ENCOUNTER — Other Ambulatory Visit: Payer: Self-pay

## 2019-04-07 VITALS — BP 142/70 | HR 69 | Wt 256.4 lb

## 2019-04-07 DIAGNOSIS — M1A0710 Idiopathic chronic gout, right ankle and foot, without tophus (tophi): Secondary | ICD-10-CM | POA: Diagnosis not present

## 2019-04-07 DIAGNOSIS — Z794 Long term (current) use of insulin: Secondary | ICD-10-CM

## 2019-04-07 DIAGNOSIS — E1140 Type 2 diabetes mellitus with diabetic neuropathy, unspecified: Secondary | ICD-10-CM | POA: Diagnosis not present

## 2019-04-07 DIAGNOSIS — I10 Essential (primary) hypertension: Secondary | ICD-10-CM | POA: Diagnosis not present

## 2019-04-07 DIAGNOSIS — D509 Iron deficiency anemia, unspecified: Secondary | ICD-10-CM

## 2019-04-07 DIAGNOSIS — M461 Sacroiliitis, not elsewhere classified: Secondary | ICD-10-CM

## 2019-04-07 DIAGNOSIS — M1A39X0 Chronic gout due to renal impairment, multiple sites, without tophus (tophi): Secondary | ICD-10-CM | POA: Diagnosis not present

## 2019-04-07 MED ORDER — COLCHICINE 0.6 MG PO TABS: 0.6000 mg | ORAL_TABLET | ORAL | 1 refills | Status: DC | PRN

## 2019-04-07 MED ORDER — ALLOPURINOL 100 MG PO TABS: 100.0000 mg | ORAL_TABLET | Freq: Every day | ORAL | 3 refills | Status: DC

## 2019-04-07 NOTE — Assessment & Plan Note (Signed)
Repeat hearing screen today supports results of last hearing screen.  Patient does not hear high-frequency sound, which is consistent with age-related hearing loss.  Patient is able to have a completely normal conversation, and states that her ears do not bother her right now.  I offered to refer her to audiology if she would like hearing aid, or ENT if she would like another opinion.  She states at this time she will hold off.

## 2019-04-07 NOTE — Assessment & Plan Note (Signed)
Made lab appointment for May 10, 2019 for A1c and CBC.  Will assess anemia at that time.

## 2019-04-07 NOTE — Assessment & Plan Note (Signed)
Patient's gout appears stable at this time.  She has no repeat flares.  He had run out of her colchicine during her last flare, states she had difficulty receiving this medication after her last visit.  I will represcribe colchicine for a flareup today.  She also needs a refill of allopurinol for control.  Her last uric acid was 6.4 in June. -Continue colchicine as needed for flareups and allopurinol daily

## 2019-04-07 NOTE — Assessment & Plan Note (Signed)
Patient has appointment on April 21, 2019 with Raeford Razor.  I recommended that she keep this appointment as this physical therapist is one of the best at addressing this particular problem.  Patient requested address, which was given.

## 2019-04-07 NOTE — Progress Notes (Signed)
Subjective:    Patient ID: Michelle Abbott, female    DOB: 1945-07-01, 73 y.o.   MRN: [id number]   CC: Follow-up  HPI: Gout: [name] states that she has difficulty getting allopurinol and colchicine, request that these be represcribed for her.  He has not had a flare since her last one 7 weeks ago.  Hypertension: She also had difficulty getting amlodipine, only received it last week.  She has been taking it with for 1 week without issue.  She saw her nephrologist this week, who sent a note to me the suggested we increase losartan for hypertension management, however patient is currently taking losartan 100 mg daily.  Hip/back/SI joint pain: Patient still has pain in her right hip and low back related to SI joint.  She has an appointment for physical therapy with Raeford Razor on December 4.  I recommend that she keep this appointment is Raeford Razor is a very appropriate physical therapist to see for this problem and her pain is the rate limiting step and increasing exercise to help her diabetes.  Diabetes: Lantus was increased at last visit with Dr. Valentina Lucks.  Her A1c in October was 9.5, which was mildly decreased from 9.8 her visit before.  I had mentioned to her previously that she would qualify to see a dietitian based on her diabetes and hypertension, she declines this as she feels that she does not need more training on what to eat, but states that she just need to "do it."  We discussed making measurable small goals, such as limiting the amount of soda or juice or walking for 30 minutes 3 days a week.   Hearing: Patient had previously discussed difficulty hearing when people are talking.  We will repeat hearing test today.  Smoking status reviewed   ROS: pertinent noted in the HPI   Past medical history, surgical, family, and social history reviewed and updated in the EMR as appropriate.  Objective:  BP (!) 142/70   Pulse 69   Wt 256 lb 6.4 oz (116.3 kg)   SpO2 99%   BMI  45.42 kg/m   Vitals and nursing note reviewed  General: NAD, pleasant, able to participate in exam, overweight older woman, well groomed Cardiac: RRR, S1 S2 present. normal heart sounds, no murmurs. Respiratory: CTAB, normal effort, No wheezes, rales or rhonchi Extremities: trace edema in LEs bilaterally, no cyanosis. Skin: warm and dry, no rashes noted Neuro: alert, no obvious focal deficits Psych: Normal affect and mood   Assessment & Plan:  Michelle Abbott is a 73 year old woman with history of diabetes, hypertension, gout, anemia.  Gout Patient's gout appears stable at this time.  She has no repeat flares.  He had run out of her colchicine during her last flare, states she had difficulty receiving this medication after her last visit.  I will represcribe colchicine for a flareup today.  She also needs a refill of allopurinol for control.  Her last uric acid was 6.4 in June. -Continue colchicine as needed for flareups and allopurinol daily  Type 2 diabetes mellitus with diabetic neuropathy, with long-term current use of insulin (HCC) Discussed diet and exercise at length with patient today including how to make SMART (specific, measurable, achievable, relevant, and time based) goals for improving diet and exercise to help with her diabetes.  Patient states that she thinks she can decrease the amount of juice that she drinks on a daily basis by increasing her water consumption.  She also states  that she will try to walk for 30 minutes at least once a week and build up to walking for 30 minutes 3 times a week.  She states that her sugars at home have been improved and are more often between 120 and 200. -Next A1c will be December 23 in a lab appointment  Essential hypertension Patient only recently started amlodipine, less than a week ago.  Her hypertension today is above goal, 142/70.  Received a note from her nephrologist suggesting increase in losartan, however patient is at 100 mg of  losartan currently, which is the maximum that I prescribe.  She has an appointment with Dr. Valentina Lucks on 04/11/2019, will have her follow-up with him to assess blood pressure that day as she will have been on amlodipine for more than 1 week at that point. -Follow-up with Dr. Valentina Lucks on 04/11/2019  Age-related hearing loss Repeat hearing screen today supports results of last hearing screen.  Patient does not hear high-frequency sound, which is consistent with age-related hearing loss.  Patient is able to have a completely normal conversation, and states that her ears do not bother her right now.  I offered to refer her to audiology if she would like hearing aid, or ENT if she would like another opinion.  She states at this time she will hold off.  SI (sacroiliac) joint inflammation [facility]) Patient has appointment on April 21, 2019 with Raeford Razor.  I recommended that she keep this appointment as this physical therapist is one of the best at addressing this particular problem.  Patient requested address, which was given.    Anemia Made lab appointment for May 10, 2019 for A1c and CBC.  Will assess anemia at that time.  Gladys Damme, MD [place] PGY-1

## 2019-04-07 NOTE — Patient Instructions (Addendum)
It was a pleasure taking care of you today!  We discussed many things about your health:  1. I have sent allopurinol and colchicine to be refilled for gout.   2. For now, we will continue treating your high blood pressure with amlodipine, the dose of losartan you are on now, and metoprolol. No medication changes today. Follow-up with Dr. Valentina Lucks on the 24th.  3.  You have age-related hearing loss.  If this is bothersome to you in the future, I can send you to an audiologist to be fitted for hearing aid.  4. I recommend you see Raeford Razor, the physical therapist who specializes in hips and SI joints on 04/21/2019  5. Return in December for a lab visit for blood work.  Be Well!  Dr Chauncey Reading   Diabetes Mellitus and Nutrition, Adult When you have diabetes (diabetes mellitus), it is very important to have healthy eating habits because your blood sugar (glucose) levels are greatly affected by what you eat and drink. Eating healthy foods in the appropriate amounts, at about the same times every day, can help you:  Control your blood glucose.  Lower your risk of heart disease.  Improve your blood pressure.  Reach or maintain a healthy weight. Every person with diabetes is different, and each person has different needs for a meal plan. Your health care provider may recommend that you work with a diet and nutrition specialist (dietitian) to make a meal plan that is best for you. Your meal plan may vary depending on factors such as:  The calories you need.  The medicines you take.  Your weight.  Your blood glucose, blood pressure, and cholesterol levels.  Your activity level.  Other health conditions you have, such as heart or kidney disease. How do carbohydrates affect me? Carbohydrates, also called carbs, affect your blood glucose level more than any other type of food. Eating carbs naturally raises the amount of glucose in your blood. Carb counting is a method for keeping track of  how many carbs you eat. Counting carbs is important to keep your blood glucose at a healthy level, especially if you use insulin or take certain oral diabetes medicines. It is important to know how many carbs you can safely have in each meal. This is different for every person. Your dietitian can help you calculate how many carbs you should have at each meal and for each snack. Foods that contain carbs include:  Bread, cereal, rice, pasta, and crackers.  Potatoes and corn.  Peas, beans, and lentils.  Milk and yogurt.  Fruit and juice.  Desserts, such as cakes, cookies, ice cream, and candy. How does alcohol affect me? Alcohol can cause a sudden decrease in blood glucose (hypoglycemia), especially if you use insulin or take certain oral diabetes medicines. Hypoglycemia can be a life-threatening condition. Symptoms of hypoglycemia (sleepiness, dizziness, and confusion) are similar to symptoms of having too much alcohol. If your health care provider says that alcohol is safe for you, follow these guidelines:  Limit alcohol intake to no more than 1 drink per day for nonpregnant women and 2 drinks per day for men. One drink equals 12 oz of beer, 5 oz of wine, or 1 oz of hard liquor.  Do not drink on an empty stomach.  Keep yourself hydrated with water, diet soda, or unsweetened iced tea.  Keep in mind that regular soda, juice, and other mixers may contain a lot of sugar and must be counted as carbs. What are  tips for following this plan?  Reading food labels  Start by checking the serving size on the "Nutrition Facts" label of packaged foods and drinks. The amount of calories, carbs, fats, and other nutrients listed on the label is based on one serving of the item. Many items contain more than one serving per package.  Check the total grams (g) of carbs in one serving. You can calculate the number of servings of carbs in one serving by dividing the total carbs by 15. For example, if a  food has 30 g of total carbs, it would be equal to 2 servings of carbs.  Check the number of grams (g) of saturated and trans fats in one serving. Choose foods that have low or no amount of these fats.  Check the number of milligrams (mg) of salt (sodium) in one serving. Most people should limit total sodium intake to less than 2,300 mg per day.  Always check the nutrition information of foods labeled as "low-fat" or "nonfat". These foods may be higher in added sugar or refined carbs and should be avoided.  Talk to your dietitian to identify your daily goals for nutrients listed on the label. Shopping  Avoid buying canned, premade, or processed foods. These foods tend to be high in fat, sodium, and added sugar.  Shop around the outside edge of the grocery store. This includes fresh fruits and vegetables, bulk grains, fresh meats, and fresh dairy. Cooking  Use low-heat cooking methods, such as baking, instead of high-heat cooking methods like deep frying.  Cook using healthy oils, such as olive, canola, or sunflower oil.  Avoid cooking with butter, cream, or high-fat meats. Meal planning  Eat meals and snacks regularly, preferably at the same times every day. Avoid going long periods of time without eating.  Eat foods high in fiber, such as fresh fruits, vegetables, beans, and whole grains. Talk to your dietitian about how many servings of carbs you can eat at each meal.  Eat 4-6 ounces (oz) of lean protein each day, such as lean meat, chicken, fish, eggs, or tofu. One oz of lean protein is equal to: ? 1 oz of meat, chicken, or fish. ? 1 egg. ?  cup of tofu.  Eat some foods each day that contain healthy fats, such as avocado, nuts, seeds, and fish. Lifestyle  Check your blood glucose regularly.  Exercise regularly as told by your health care provider. This may include: ? 150 minutes of moderate-intensity or vigorous-intensity exercise each week. This could be brisk walking,  biking, or water aerobics. ? Stretching and doing strength exercises, such as yoga or weightlifting, at least 2 times a week.  Take medicines as told by your health care provider.  Do not use any products that contain nicotine or tobacco, such as cigarettes and e-cigarettes. If you need help quitting, ask your health care provider.  Work with a Social worker or diabetes educator to identify strategies to manage stress and any emotional and social challenges. Questions to ask a health care provider  Do I need to meet with a diabetes educator?  Do I need to meet with a dietitian?  What number can I call if I have questions?  When are the best times to check my blood glucose? Where to find more information:  American Diabetes Association: diabetes.org  Academy of Nutrition and Dietetics: www.eatright.[employer] of Diabetes and Digestive and Kidney Diseases (NIH): [employer] Summary  A healthy meal plan will help you control your  blood glucose and maintain a healthy lifestyle.  Working with a diet and nutrition specialist (dietitian) can help you make a meal plan that is best for you.  Keep in mind that carbohydrates (carbs) and alcohol have immediate effects on your blood glucose levels. It is important to count carbs and to use alcohol carefully. This information is not intended to replace advice given to you by your health care provider. Make sure you discuss any questions you have with your health care provider. Document Released: 01/29/2005 Document Revised: 04/16/2017 Document Reviewed: 06/08/2016 Elsevier Patient Education  2020 [employer].

## 2019-04-07 NOTE — Assessment & Plan Note (Signed)
Patient only recently started amlodipine, less than a week ago.  Her hypertension today is above goal, 142/70.  Received a note from her nephrologist suggesting increase in losartan, however patient is at 100 mg of losartan currently, which is the maximum that I prescribe.  She has an appointment with Dr. Valentina Lucks on 04/11/2019, will have her follow-up with him to assess blood pressure that day as she will have been on amlodipine for more than 1 week at that point. -Follow-up with Dr. Valentina Lucks on 04/11/2019

## 2019-04-07 NOTE — Assessment & Plan Note (Addendum)
Discussed diet and exercise at length with patient today including how to make SMART (specific, measurable, achievable, relevant, and time based) goals for improving diet and exercise to help with her diabetes.  Patient states that she thinks she can decrease the amount of juice that she drinks on a daily basis by increasing her water consumption.  She also states that she will try to walk for 30 minutes at least once a week and build up to walking for 30 minutes 3 times a week.  She states that her sugars at home have been improved and are more often between 120 and 200. -Next A1c will be December 23 in a lab appointment

## 2019-04-11 ENCOUNTER — Ambulatory Visit (INDEPENDENT_AMBULATORY_CARE_PROVIDER_SITE_OTHER): Payer: Medicare Other | Admitting: Pharmacist

## 2019-04-11 ENCOUNTER — Encounter: Payer: Self-pay | Admitting: Pharmacist

## 2019-04-11 ENCOUNTER — Other Ambulatory Visit: Payer: Self-pay

## 2019-04-11 DIAGNOSIS — E1140 Type 2 diabetes mellitus with diabetic neuropathy, unspecified: Secondary | ICD-10-CM

## 2019-04-11 DIAGNOSIS — Z794 Long term (current) use of insulin: Secondary | ICD-10-CM | POA: Diagnosis not present

## 2019-04-11 DIAGNOSIS — M1A0710 Idiopathic chronic gout, right ankle and foot, without tophus (tophi): Secondary | ICD-10-CM

## 2019-04-11 DIAGNOSIS — I10 Essential (primary) hypertension: Secondary | ICD-10-CM

## 2019-04-11 MED ORDER — OZEMPIC (0.25 OR 0.5 MG/DOSE) 2 MG/1.5ML ~~LOC~~ SOPN: 0.5000 mg | PEN_INJECTOR | SUBCUTANEOUS | 2 refills | Status: DC

## 2019-04-11 MED ORDER — CHLORTHALIDONE 25 MG PO TABS: 25.0000 mg | ORAL_TABLET | Freq: Every day | ORAL | 1 refills | Status: DC

## 2019-04-11 MED ORDER — OZEMPIC (0.25 OR 0.5 MG/DOSE) 2 MG/1.5ML ~~LOC~~ SOPN: 0.2500 mg | PEN_INJECTOR | SUBCUTANEOUS | 0 refills | Status: DC

## 2019-04-11 MED ORDER — CARVEDILOL 12.5 MG PO TABS: 12.5000 mg | ORAL_TABLET | Freq: Two times a day (BID) | ORAL | 3 refills | Status: DC

## 2019-04-11 MED ORDER — COLCHICINE 0.6 MG PO TABS: 0.6000 mg | ORAL_TABLET | ORAL | 1 refills | Status: DC | PRN

## 2019-04-11 NOTE — Progress Notes (Signed)
Reviewed: Agree with Dr. Koval's documentation and management. 

## 2019-04-11 NOTE — Progress Notes (Signed)
S:     Chief Complaint  Patient presents with  . Medication Management    diabetes    Patient arrives ambulating without assistance.  Presents for diabetes evaluation, education, and management  Patient was referred and last seen by Primary Care Provider on 04/07/2019.   Patient reports swelling since starting the amlodipine 5 mg and stopping diuretic.  Patient reports Diabetes was diagnosed in 2018.   Insurance coverage/medication affordability: [facility] Medicare  Patient reports adherence with medications.  Current diabetes medications include: Lantus (insulin glargine) 40 units daily, Victoza (liraglutide) 1.8 mg daily Current hypertension medications include: Cozaar (losartan) 100 mg daily, Toprol-XL (metoprolol succinate) 100 mg daily, (Norvasc) amlodipine 5 mg daily Current hyperlipidemia medications include: rosuvastatin 40 mg daily  Patient denies hypoglycemic events.  Patient reports experiencing a tremor that has slightly worsened.    O:  Physical Exam Vitals signs reviewed.  Constitutional:      Appearance: Normal appearance.  Musculoskeletal:     Right lower leg: Edema ( 1+) present.     Left lower leg: Edema ( 1+) present.  Neurological:     Mental Status: She is alert.  Psychiatric:        Behavior: Behavior normal.        Thought Content: Thought content normal.        Judgment: Judgment normal.      Review of Systems  Neurological: Positive for dizziness.  All other systems reviewed and are negative.    Lab Results  Component Value Date   HGBA1C 9.5 (A) 03/09/2019   There were no vitals filed for this visit.  Lipid Panel     Component Value Date/Time   CHOL 131 03/09/2019 1137   TRIG 121 03/09/2019 1137   HDL 55 03/09/2019 1137   CHOLHDL 2.4 03/09/2019 1137   LDLCALC 55 03/09/2019 1137    Home fasting blood sugars: 150-180, 209 this AM (ate dinner late last night)   Clinical Atherosclerotic Cardiovascular Disease (ASCVD): No  The  10-year ASCVD risk score Mikey Bussing DC Jr., et al., 2013) is: 23.1%   Values used to calculate the score:     Age: 73 years     Sex: Female     Is Non-Hispanic African American: Yes     Diabetic: Yes     Tobacco smoker: No     Systolic Blood Pressure: 097 mmHg     Is BP treated: Yes     HDL Cholesterol: 55 mg/dL     Total Cholesterol: 131 mg/dL    A/P: Diabetes longstanding currently uncontrolled. Patient is adherent with medication. Control is suboptimal due to inadequate therapy and sedentary lifestyle d/t covid. -Continued basal insulin Lantus (insulin glargine) 40 units once daily.  -Discontinued Victoza (liraglutide). -Gave samples of GLP-1 Ozempic (semaglutide). - Start Ozempic 0.25 mg weekly for 2 weeks, followed by 0.5 mg weekly until next PCP visit.  Medication Samples have been provided to the patient.  Drug name: Ozempic        Qty: 1  LOT: [id number]  Exp.Date: 03/17/2021 Dosing instructions: Inject 0.25 mg once weekly.  The patient has been instructed regarding the correct time, dose, and frequency of taking this medication, including desired effects and most common side effects.  -Extensively discussed pathophysiology of diabetes, recommended lifestyle interventions, dietary effects on blood sugar control -Counseled on s/sx of and management of hypoglycemia -Next A1C anticipated 05/2019.  -Instructed to increase physical activity and be more adherent to diet.  ASCVD risk -  primary prevention in patient with diabetes. Last LDL is controlled. ASCVD risk score is >20%  - high intensity statin indicated. Aspirin is indicated.  -Continued aspirin 81 mg. -Continued rosuvastatin 40 mg.   Hypertension longstanding currently close to controlled.  Blood pressure goal <130/80 mmHg. Patient appears adherent.  Blood pressure control is suboptimal due to sedentary lifestyle (d/t covid) and inadequate therapy. -Discontinued amlodipine -Discontinued metoprolol succinate -Restarted  chlorthalidone 25 mg daily -Started carvedilol 12.5 mg twice daily  Instructed to contact PCP to address tremor symptoms. No medications identified that is likely to cause tremor for Michelle Abbott.  Written patient instructions provided.  Total time in face to face counseling 60 minutes.   Follow up PCP Clinic Visit 05/09/2019.   Patient seen with Donald Prose, PharmD Candidate, Simonne Come, PharmD Candidate and Sherren Kerns, PharmD PGY-1 Resident.

## 2019-04-11 NOTE — Patient Instructions (Addendum)
It was great to see you today!  Blood pressure: Stop taking amlodipine Restart chlorthalidone 25 mg once daily  Stop taking metoprolol Start taking carvedilol 12.5 mg twice daily  Diabetes: Stop taking Victoza Start taking Ozempic 0.25 mg injected once weekly - After 2 weeks at 0.25 mg, increase dose to 0.5 mg once weekly  Follow up with Dr. Chauncey Reading

## 2019-04-11 NOTE — Assessment & Plan Note (Signed)
Hypertension longstanding currently close to controlled.  Blood pressure goal <130/80 mmHg. Patient appears adherent.  Blood pressure control is suboptimal due to sedentary lifestyle (d/t covid) and inadequate therapy. -Discontinued amlodipine -Discontinued metoprolol succinate -Restarted chlorthalidone 25 mg daily -Started carvedilol 12.5 mg twice daily

## 2019-04-11 NOTE — Assessment & Plan Note (Signed)
Diabetes longstanding currently uncontrolled. Patient is adherent with medication. Control is suboptimal due to inadequate therapy and sedentary lifestyle d/t covid. -Continued basal insulin Lantus (insulin glargine) 40 units once daily.  -Discontinued Victoza (liraglutide). -Gave samples of GLP-1 Ozempic (semaglutide). - Start Ozempic 0.25 mg weekly for 2 weeks, followed by 0.5 mg weekly until next PCP visit.  The patient has been instructed regarding the correct time, dose, and frequency of taking this medication, including desired effects and most common side effects.  -Extensively discussed pathophysiology of diabetes, recommended lifestyle interventions, dietary effects on blood sugar control -Counseled on s/sx of and management of hypoglycemia -Next A1C anticipated 05/2019.  -Instructed to increase physical activity and be more adherent to diet.

## 2019-04-12 ENCOUNTER — Telehealth: Payer: Self-pay | Admitting: *Deleted

## 2019-04-12 DIAGNOSIS — M1A0710 Idiopathic chronic gout, right ankle and foot, without tophus (tophi): Secondary | ICD-10-CM

## 2019-04-12 MED ORDER — COLCHICINE 0.6 MG PO TABS: ORAL_TABLET | ORAL | 1 refills | Status: DC

## 2019-04-12 NOTE — Telephone Encounter (Signed)
[facility] needs clarification on colchicine script.  Current sig = Take 1 tablet (0.6 mg total) by mouth as needed. Take 2 tablets (1.2 mg total) at onset of gout flare then take 1 tablet (0.6 mg) one hour later if symptoms persist. (MAX: 1.8 mg total daily) - Oral  Verified with Dr. Owens Shark (Dr. Chauncey Reading post call) that medication is a flare up med.  Sig corrected and resent electronically.  Changed in chart. Christen Bame, CMA

## 2019-04-12 NOTE — Telephone Encounter (Signed)
Agree.   Thank you! Dorris Singh, MD  Family Medicine Teaching Service

## 2019-04-21 ENCOUNTER — Ambulatory Visit: Payer: Medicare Other | Admitting: Physical Therapy

## 2019-04-26 ENCOUNTER — Encounter: Payer: Self-pay | Admitting: Physical Therapy

## 2019-04-26 ENCOUNTER — Other Ambulatory Visit: Payer: Self-pay

## 2019-04-26 ENCOUNTER — Ambulatory Visit: Payer: Medicare Other | Attending: Family Medicine | Admitting: Physical Therapy

## 2019-04-26 DIAGNOSIS — M545 Low back pain, unspecified: Secondary | ICD-10-CM

## 2019-04-26 DIAGNOSIS — G8929 Other chronic pain: Secondary | ICD-10-CM | POA: Diagnosis not present

## 2019-04-26 NOTE — Therapy (Signed)
[place], [place], [address] Phone: [phone]   Fax:  [phone]  Physical Therapy Evaluation/Discharge   Patient Details  Name: Michelle Abbott MRN: [id number] Date of Birth: Jan 30, 1946 Referring Provider (PT): Noni Saupe, MD    Encounter Date: 04/26/2019  PT End of Session - 04/26/19 1245    Visit Number  1    Number of Visits  1    Date for PT Re-Evaluation  04/26/19    PT Start Time  7416    PT Stop Time  1225    PT Time Calculation (min)  49 min    Activity Tolerance  Patient tolerated treatment well    Behavior During Therapy  [facility] for tasks assessed/performed       Past Medical History:  Diagnosis Date  . Anemia   . Arthritis   . Chronic kidney disease   . Diabetes mellitus without complication ([place])   . Gout 2017  . Hyperlipidemia   . Hypertension   . Sciatica   . Vitamin D deficiency 2014    Past Surgical History:  Procedure Laterality Date  . BREAST SURGERY Right 1961   Benign lump removed  . CATARACT EXTRACTION, BILATERAL Bilateral 2017  . CESAREAN SECTION  1985  . CESAREAN SECTION  1989  . CHOLECYSTECTOMY  1999  . DILATION AND CURETTAGE OF UTERUS  2017  . KNEE ARTHROSCOPY Right   . VAGINAL SEPTOPLASTY  1966    There were no vitals filed for this visit.   Subjective Assessment - 04/26/19 1141    Subjective  Pt has had chronic pain in her back for many years (10 yrs).  She has had some success with PT.  She has pain in her Rt side> Lt. mostly with standing, walking.  Has mild tremors.  Denies weakness or sensory changes.  She has diffculty getting in and out of the tub. Once in awhile she has some Rt LE, knee issues. Pain interferes with sleep, reaching up for cleaning.    Pertinent History  R knee scope    Limitations  Sitting;Lifting;Standing;Walking;House hold activities    How long can you stand comfortably?  can do 15-20 min in the home    How long can you walk  comfortably?  walks to her daughters home but needs to rest (10-15 min )    Diagnostic tests  none    Patient Stated Goals  Pt would like to be able to walk for fitness , increase activity.    Currently in Pain?  Yes    Pain Score  5     Pain Location  Back    Pain Orientation  Right;Lower    Pain Descriptors / Indicators  Aching;Tightness;Sharp;Shooting   sometimes shooting   Pain Type  Chronic pain    Pain Radiating Towards  Rt hip    Pain Onset  More than a month ago    Pain Frequency  Intermittent    Aggravating Factors   standing, walking, sitting too long, AM hours    Pain Relieving Factors  moving around, heating pad , takes occasional tylenol, tramadol    Effect of Pain on Daily Activities  limits her activity         [facility] PT Assessment - 04/26/19 0001      Assessment   Medical Diagnosis  Low back pain     Referring Provider (PT)  Noni Saupe, MD     Onset Date/Surgical Date  --  acute on chronic    Prior Therapy  Yes, in [place]       Precautions   Precautions  None      Restrictions   Weight Bearing Restrictions  No      Balance Screen   Has the patient fallen in the past 6 months  No    Has the patient had a decrease in activity level because of a fear of falling?   No    Is the patient reluctant to leave their home because of a fear of falling?   No      Home Environment   Living Environment  Private residence    Living Arrangements  Alone    Type of [place]  One level    [place] - single point      Prior Function   Level of Independence  Independent with basic ADLs;Independent with household mobility without device;Independent with community mobility without device    Vocation  Retired    Media planner     Leisure  Daughter lives near her, reading, writing poetry, plays, bible study, walking used to be something she could do       Cognition   Overall Cognitive Status  Within Functional  Limits for tasks assessed      Observation/Other Assessments   Focus on Therapeutic Outcomes (FOTO)   NT one time eval       Sensation   Light Touch  Appears Intact      Posture/Postural Control   Posture/Postural Control  Postural limitations      AROM   Lumbar Flexion  WFL     Lumbar Extension  >50% limited     Lumbar - Right Side Bend  pain on R    Lumbar - Left Side Bend  pain on R     Lumbar - Right Rotation  painful 50%     Lumbar - Left Rotation  less pain 25%       Strength   Overall Strength Comments  WFL in knees.  glutes and lateral hip 3+/5       Flexibility   Soft Tissue Assessment /Muscle Length  --   Rt hamstring tighter than L      Palpation   Palpation comment  painful along Rt side of low lumbr spine into gluteals       Special Tests   Other special tests  neg SLR       Transfers   Five time sit to stand comments   19 sec no UE from chair       Ambulation/Gait   Gait Comments  no deviations noted other than decreased speed           Objective measurements completed on examination: See above findings.     PT Education - 04/26/19 1244    Education Details  PT.POC, HEP, prefers to do HEP at home and return if needed, MHP options , arthritis/DDD    Person(s) Educated  Patient    Methods  Explanation;Demonstration;Handout    Comprehension  Verbalized understanding;Verbal cues required;Returned demonstration       PT Short Term Goals - 04/26/19 1245      PT SHORT TERM GOAL #1   Title  Pt will be given HEP and understand POC    Time  1    Period  Days    Status  Achieved  Target Date  04/26/19                Plan - 04/26/19 1246    Clinical Impression Statement  Patient presents for low complexity eval of Rt sided low back pain, chronic over many years.  She chose to do HEP at home due to transportation issues.  Hips are weak in abduction and extension.  Pain in spine with extension, rotation and stiffness with sidebending.  Given advice for self care, walking and HEP to address deficits.    Personal Factors and Comorbidities  Age;Comorbidity 1;Past/Current Experience    Comorbidities  obesity, diabetes, arthritis    Examination-Activity Limitations  Bed Mobility;Lift;Stairs;Squat;Bend;Stand;Locomotion Level;Reach Overhead;Transfers;Sit;Sleep    Examination-Participation Restrictions  Community Activity;Interpersonal Relationship;Meal Prep    Stability/Clinical Decision Making  Stable/Uncomplicated    Clinical Decision Making  Low    Rehab Potential  Excellent    PT Frequency  One time visit    PT Treatment/Interventions  ADLs/Self Care Home Management;Therapeutic exercise    PT Next Visit Plan  NA, DC    PT Home Exercise Plan  lower trunk rotation, piriformis, hip abd standing and sit to stand    Consulted and Agree with Plan of Care  Patient       Patient will benefit from skilled therapeutic intervention in order to improve the following deficits and impairments:  Decreased activity tolerance, Decreased range of motion, Decreased mobility, Increased fascial restricitons, Pain, Improper body mechanics, Impaired flexibility, Difficulty walking  Visit Diagnosis: Chronic low back pain without sciatica, unspecified back pain laterality     Problem List Patient Active Problem List   Diagnosis Date Noted  . Age-related hearing loss 03/12/2019  . SI (sacroiliac) joint inflammation ([place]) 03/12/2019  . Need for immunization against influenza 03/12/2019  . UTI (urinary tract infection) 02/02/2019  . Sciatica 11/17/2016  . Echocardiogram shows left ventricular diastolic dysfunction 58/01/9832  . CKD (chronic kidney disease), stage III ([place]) 07/21/2016  . Type 2 diabetes mellitus with diabetic neuropathy, with long-term current use of insulin ([place]) 06/15/2016  . Gout 06/15/2016  . Essential hypertension 06/15/2016  . Anemia 06/15/2016  . Vitamin D deficiency 06/15/2016  . Osteoarthritis 06/15/2016  .  Hyperlipemia 06/15/2016  . Low back pain 06/15/2016    Michelle Abbott 04/26/2019, 12:52 PM  [facility] [address] [place], [place], [address] Phone: [phone]   Fax:  [phone]  Name: Michelle Abbott MRN: [id number] Date of Birth: 12/16/1945  Raeford Razor, PT 04/26/19 12:52 PM Phone: [phone] Fax: [phone]

## 2019-04-26 NOTE — Patient Instructions (Signed)
HIP / KNEE: Extension - Sit to Stand    Sitting, lean chest forward, raise hips up from surface. Straighten hips and knees. Weight bear equally on left and right sides. Backs of legs should not push off surface. __10_ reps per set, __2-3_ sets per day, _5__ days per week   Copyright  VHI. All rights reserved.     Piriformis Stretch    Lying on back, pull right knee toward opposite shoulder. Hold __30__ seconds. Repeat ___3_ times. Do ___1_ sessions per day.  http://gt2.exer.us/258   Copyright  VHI. All rights reserved.  Lower Trunk Rotation Stretch    Keeping back flat and feet together, rotate knees to left side. Hold __10__ seconds. Repeat ___10_ times per set. Do ___1-3_ sets per session. Do ___2_ sessions per day.  http://orth.exer.us/123   Copyright  VHI. All rights reserved.    ABDUCTION: Standing (Active)    Stand, feet flat. Lift right leg out to side. Complete __2_ sets of __10_ repetitions. Perform ___2 sessions per day.  http://gtsc.exer.us/111   Copyright  VHI. All rights reserved.

## 2019-05-09 ENCOUNTER — Other Ambulatory Visit: Payer: Medicare Other

## 2019-05-09 ENCOUNTER — Other Ambulatory Visit: Payer: Self-pay

## 2019-05-09 DIAGNOSIS — Z794 Long term (current) use of insulin: Secondary | ICD-10-CM | POA: Diagnosis not present

## 2019-05-09 DIAGNOSIS — E1140 Type 2 diabetes mellitus with diabetic neuropathy, unspecified: Secondary | ICD-10-CM

## 2019-05-09 DIAGNOSIS — D509 Iron deficiency anemia, unspecified: Secondary | ICD-10-CM

## 2019-05-10 LAB — CBC WITH DIFFERENTIAL/PLATELET
Basophils Absolute: 0 10*3/uL (ref 0.0–0.2)
Basos: 1 %
EOS (ABSOLUTE): 0.2 10*3/uL (ref 0.0–0.4)
Eos: 4 %
Hematocrit: 32.4 % — ABNORMAL LOW (ref 34.0–46.6)
Hemoglobin: 9.9 g/dL — ABNORMAL LOW (ref 11.1–15.9)
Immature Grans (Abs): 0 10*3/uL (ref 0.0–0.1)
Immature Granulocytes: 0 %
Lymphocytes Absolute: 1.4 10*3/uL (ref 0.7–3.1)
Lymphs: 26 %
MCH: 26.2 pg — ABNORMAL LOW (ref 26.6–33.0)
MCHC: 30.6 g/dL — ABNORMAL LOW (ref 31.5–35.7)
MCV: 86 fL (ref 79–97)
Monocytes Absolute: 0.6 10*3/uL (ref 0.1–0.9)
Monocytes: 11 %
Neutrophils Absolute: 3.3 10*3/uL (ref 1.4–7.0)
Neutrophils: 58 %
Platelets: 234 10*3/uL (ref 150–450)
RBC: 3.78 x10E6/uL (ref 3.77–5.28)
RDW: 13.5 % (ref 11.7–15.4)
WBC: 5.5 10*3/uL (ref 3.4–10.8)

## 2019-05-10 LAB — HEMOGLOBIN A1C
Est. average glucose Bld gHb Est-mCnc: 220 mg/dL
Hgb A1c MFr Bld: 9.3 % — ABNORMAL HIGH (ref 4.8–5.6)

## 2019-06-01 NOTE — Progress Notes (Signed)
Hemoglobin low, patient has been on iron before. Plan to do anemia panel work up at next visit.

## 2019-06-23 DIAGNOSIS — H43823 Vitreomacular adhesion, bilateral: Secondary | ICD-10-CM | POA: Diagnosis not present

## 2019-06-23 DIAGNOSIS — E113391 Type 2 diabetes mellitus with moderate nonproliferative diabetic retinopathy without macular edema, right eye: Secondary | ICD-10-CM | POA: Diagnosis not present

## 2019-06-23 DIAGNOSIS — E113312 Type 2 diabetes mellitus with moderate nonproliferative diabetic retinopathy with macular edema, left eye: Secondary | ICD-10-CM | POA: Diagnosis not present

## 2019-06-26 ENCOUNTER — Telehealth: Payer: Self-pay | Admitting: *Deleted

## 2019-06-26 NOTE — Telephone Encounter (Signed)
C/O Pain in lower back and urine odor but denies dysuria, fever, increased frequency.  She must make appt at least 3 days out due to transportation.   Appt made for Wednesday (telphone) and Friday afternoon, which can be canceled depending on Wednesday's appt. Red flags give to go to ED/ UC. Christen Bame, CMA

## 2019-06-26 NOTE — Telephone Encounter (Signed)
Pt lm on nurse line c/o of UTI symptoms.  LMOVM for pt to return call.  Christen Bame, CMA

## 2019-06-28 ENCOUNTER — Other Ambulatory Visit: Payer: Self-pay

## 2019-06-28 ENCOUNTER — Telehealth (INDEPENDENT_AMBULATORY_CARE_PROVIDER_SITE_OTHER): Payer: Medicare Other | Admitting: Family Medicine

## 2019-06-28 DIAGNOSIS — M545 Low back pain, unspecified: Secondary | ICD-10-CM

## 2019-06-28 NOTE — Progress Notes (Signed)
[place] Telemedicine Visit  Patient consented to have virtual visit. Method of visit: Video, towards the end of encounter had to switch to telephone to discuss plan as video cut out   Encounter participants: Patient: Michelle Abbott - located at Home Provider: Caroline More - located at [facility] Others (if applicable): None  Chief Complaint: back pain  HPI: Back pain  RN line call regarding low back pain and urinary odor. Patient reports she has had sharp pain in mid back and on sides "around the kidneys" right above her waist. Present x2 weeks. Noticed urinary odor as well, this only occurred 1-2 time. Has a h/o of CKD3. Does notice pressure when she urinates. Denies urgency or frequency. No hematuria. Urine is a light yellow. Denies h/o kidney stones. Denies h/o trauma. Patient has taken tramadol for pain, this did not help. Made her sleepy. Denies fevers.   ROS: per HPI  Pertinent PMHx: HTN, T2DM, sciatica, osteoarthritis, SI joint inflammation, CKD III, HLD  Exam:  Respiratory: speaking full sentences, no increased WOB Gen: awake and alert, NAD   Assessment/Plan:  Back pain Difficult to assess over the phone. Per chart review did see Dr. Vanetta Shawl for low back pain in 2019 at which time thought to be lumbar OA and exacerbated by obesity. Patient without urinary complaints so less likely pyelonephritis. However, will need to be evaluated in person to better assess for cause. Will not RX abx at this time. Will plan to treat conservatively with heating pads, muscle rub creams PRN. Has home tramadol but advised tylenol PRN. Patient has already scheduled appointment in 2 days, advised to keep this appointment so we can better assess. Through my conversation does appear to be MSK, but may benefit from UA if patient is concerned of UTI. Only urinary symptoms she reported was 1 day urinary odor. Strict return precautions given. Follow up in 2 days for in person visit.      Time spent during visit with patient: 15 minutes

## 2019-06-29 NOTE — Assessment & Plan Note (Signed)
Difficult to assess over the phone. Per chart review did see Dr. Vanetta Shawl for low back pain in 2019 at which time thought to be lumbar OA and exacerbated by obesity. Patient without urinary complaints so less likely pyelonephritis. However, will need to be evaluated in person to better assess for cause. Will not RX abx at this time. Will plan to treat conservatively with heating pads, muscle rub creams PRN. Has home tramadol but advised tylenol PRN. Patient has already scheduled appointment in 2 days, advised to keep this appointment so we can better assess. Through my conversation does appear to be MSK, but may benefit from UA if patient is concerned of UTI. Only urinary symptoms she reported was 1 day urinary odor. Strict return precautions given. Follow up in 2 days for in person visit.

## 2019-06-30 ENCOUNTER — Encounter: Payer: Self-pay | Admitting: Family Medicine

## 2019-06-30 ENCOUNTER — Ambulatory Visit (INDEPENDENT_AMBULATORY_CARE_PROVIDER_SITE_OTHER): Payer: Medicare Other | Admitting: Family Medicine

## 2019-06-30 ENCOUNTER — Other Ambulatory Visit: Payer: Self-pay

## 2019-06-30 VITALS — BP 132/70 | HR 91

## 2019-06-30 DIAGNOSIS — Z794 Long term (current) use of insulin: Secondary | ICD-10-CM

## 2019-06-30 DIAGNOSIS — M545 Low back pain, unspecified: Secondary | ICD-10-CM

## 2019-06-30 DIAGNOSIS — G8929 Other chronic pain: Secondary | ICD-10-CM | POA: Diagnosis not present

## 2019-06-30 DIAGNOSIS — E1140 Type 2 diabetes mellitus with diabetic neuropathy, unspecified: Secondary | ICD-10-CM

## 2019-06-30 DIAGNOSIS — Z1231 Encounter for screening mammogram for malignant neoplasm of breast: Secondary | ICD-10-CM

## 2019-06-30 MED ORDER — BACLOFEN 10 MG PO TABS: 10.0000 mg | ORAL_TABLET | Freq: Three times a day (TID) | ORAL | 0 refills | Status: DC

## 2019-06-30 NOTE — Progress Notes (Signed)
   CHIEF COMPLAINT / HPI:  BACK PAIN  Chronic back pain, worse in past 14 days ago. Pain is described as tightness. Patient has tried tramadol, tynelol. Pain radiates around right side. History of trauma or injury: N Patient believes might be causing their pain: worsening chronic back pain  Prior history of similar pain: Y History of cancer: N. Needs mammogram, but last one in 2019 was normal. Remainder of cancer screening are UTD.  Weak immune system:  Diabetes, but no s/s of infxn History of IV drug use: N History of steroid use: N  Symptoms Incontinence of bowel or bladder:  N Numbness of leg: N Fever: n Rest or Night pain: n Weight Loss:  N Rash: N   PERTINENT  PMH / PSH: As per HPI  OBJECTIVE: Today's Vitals   06/30/19 1614  BP: 132/70  Pulse: 91  SpO2: 99%  PainSc: 9   PainLoc: Back   Gen: NAD, resting comfortably GI:  Soft, Nontender, Nondistended. MSK:  No effusion or malalignment, no central throcic or lumbar spine tenderness, muscle spasm on left side Skin: warm, dry Neuro: grossly normal, moves all extremities Psych: Normal affect and thought content  PDMP checked. No gross abuse of tramadol observed.    Problem List Items Addressed This Visit      Endocrine   Type 2 diabetes mellitus with diabetic neuropathy, with long-term current use of insulin ([place])    Will get ROR for ophthalmology exam.         Other   Back pain - Primary    Chronic back pain. MSK in nature. No red flags.  - Trial of Baclofen - Cont. Tramadol, Tylenol, Diclofenac gel PRN - Ref to PT - Home exercises - Ice/heat prn - f/u if no improvement      Relevant Medications   baclofen (LIORESAL) 10 MG tablet   Other Relevant Orders   Ambulatory referral to Physical Therapy   Encounter for screening mammogram for malignant neoplasm of breast    Referred for screening mammogram      Relevant Orders   MM Digital Screening      Bonnita Hollow, MD [place]

## 2019-06-30 NOTE — Assessment & Plan Note (Signed)
Chronic back pain. MSK in nature. No red flags.  - Trial of Baclofen - Cont. Tramadol, Tylenol, Diclofenac gel PRN - Ref to PT - Home exercises - Ice/heat prn - f/u if no improvement

## 2019-06-30 NOTE — Assessment & Plan Note (Signed)
Referred for screening mammogram

## 2019-06-30 NOTE — Patient Instructions (Signed)

## 2019-06-30 NOTE — Assessment & Plan Note (Signed)
Will get ROR for ophthalmology exam.

## 2019-07-03 DIAGNOSIS — E113391 Type 2 diabetes mellitus with moderate nonproliferative diabetic retinopathy without macular edema, right eye: Secondary | ICD-10-CM | POA: Diagnosis not present

## 2019-07-03 DIAGNOSIS — H43813 Vitreous degeneration, bilateral: Secondary | ICD-10-CM | POA: Diagnosis not present

## 2019-07-03 DIAGNOSIS — E113312 Type 2 diabetes mellitus with moderate nonproliferative diabetic retinopathy with macular edema, left eye: Secondary | ICD-10-CM | POA: Diagnosis not present

## 2019-07-03 DIAGNOSIS — H43822 Vitreomacular adhesion, left eye: Secondary | ICD-10-CM | POA: Diagnosis not present

## 2019-07-05 ENCOUNTER — Other Ambulatory Visit: Payer: Self-pay | Admitting: Family Medicine

## 2019-07-05 DIAGNOSIS — E1140 Type 2 diabetes mellitus with diabetic neuropathy, unspecified: Secondary | ICD-10-CM

## 2019-07-05 DIAGNOSIS — Z794 Long term (current) use of insulin: Secondary | ICD-10-CM

## 2019-07-07 MED ORDER — ONETOUCH ULTRA VI STRP: ORAL_STRIP | 12 refills | Status: DC

## 2019-07-10 ENCOUNTER — Other Ambulatory Visit: Payer: Self-pay

## 2019-07-10 MED ORDER — ONETOUCH ULTRA VI STRP: ORAL_STRIP | 12 refills | Status: DC

## 2019-07-10 MED ORDER — ONETOUCH ULTRA VI STRP: 1.0000 | ORAL_STRIP | Freq: Every day | 4 refills | Status: DC

## 2019-07-10 NOTE — Telephone Encounter (Signed)
Fax received from pharmacy and they need clarification on test strips prescription.   In order for the insurance to pay, the script needs to have the frequency patient is to test.  Will forward to MD to add this on and resend to pharmacy. Devonta Blanford,CMA

## 2019-07-10 NOTE — Addendum Note (Signed)
Addended by: Valerie Roys on: 07/10/2019 10:01 AM   Modules accepted: Orders

## 2019-07-20 ENCOUNTER — Encounter: Payer: Self-pay | Admitting: Family Medicine

## 2019-07-20 ENCOUNTER — Ambulatory Visit (INDEPENDENT_AMBULATORY_CARE_PROVIDER_SITE_OTHER): Payer: Medicare Other | Admitting: Pharmacist

## 2019-07-20 ENCOUNTER — Ambulatory Visit (INDEPENDENT_AMBULATORY_CARE_PROVIDER_SITE_OTHER): Payer: Medicare Other | Admitting: Family Medicine

## 2019-07-20 ENCOUNTER — Other Ambulatory Visit: Payer: Self-pay

## 2019-07-20 VITALS — BP 142/78 | HR 75 | Wt 250.4 lb

## 2019-07-20 DIAGNOSIS — Z794 Long term (current) use of insulin: Secondary | ICD-10-CM

## 2019-07-20 DIAGNOSIS — G8929 Other chronic pain: Secondary | ICD-10-CM

## 2019-07-20 DIAGNOSIS — N183 Chronic kidney disease, stage 3 unspecified: Secondary | ICD-10-CM

## 2019-07-20 DIAGNOSIS — M1A0710 Idiopathic chronic gout, right ankle and foot, without tophus (tophi): Secondary | ICD-10-CM | POA: Diagnosis not present

## 2019-07-20 DIAGNOSIS — I10 Essential (primary) hypertension: Secondary | ICD-10-CM | POA: Diagnosis not present

## 2019-07-20 DIAGNOSIS — M545 Low back pain, unspecified: Secondary | ICD-10-CM

## 2019-07-20 DIAGNOSIS — E1140 Type 2 diabetes mellitus with diabetic neuropathy, unspecified: Secondary | ICD-10-CM | POA: Diagnosis not present

## 2019-07-20 LAB — POCT GLYCOSYLATED HEMOGLOBIN (HGB A1C): HbA1c, POC (controlled diabetic range): 9.1 % — AB (ref 0.0–7.0)

## 2019-07-20 NOTE — Assessment & Plan Note (Signed)
Hypertension longstanding for multiple years currently uncontrolled.  Blood pressure goal = <140/90 mmHg, considering patient >74 years old with diabetes. Currently experiencing fatigue and pulse in low 70s thus limiting carvedilol increase. Patient is followed by nephrologist, will defer to their expertise especially considering her kidney function. Patient medication adherence optimal.  Blood pressure control is suboptimal due to comorbidities. -Continue chlorthalidone 25 mg daily -Continue carvedilol 12.5 mg twice daily -Continue losartan 100 mg daily

## 2019-07-20 NOTE — Patient Instructions (Addendum)
It was a pleasure seeing  You today!  1. Keep up the good work with your diabetes medication, you are making steady progress. Goal A1c is 8, you are at 9.1.  2. Your blood pressure is around goal (<140/90, you are 142/78 today). Keep up the good work.  3. Please continue to see your Retinal specialist and send [place] the report from him. Keep seeing your nephrologist.   4. In regard to your back: we will get x-rays of your low back and hips. Continue doing the home exercises, even if it hurts it helps more than anything else. The most important thing long term for your back pain is losing weight. I have referred you to our nutritionist Iver Nestle. Please call the number on her card to set up an appointment.  Be Well!  Dr. Chauncey Reading   Chronic Back Pain When back pain lasts longer than 3 months, it is called chronic back pain.The cause of your back pain may not be known. Some common causes include:  Wear and tear (degenerative disease) of the bones, ligaments, or disks in your back.  Inflammation and stiffness in your back (arthritis). People who have chronic back pain often go through certain periods in which the pain is more intense (flare-ups). Many people can learn to manage the pain with home care. Follow these instructions at home: Pay attention to any changes in your symptoms. Take these actions to help with your pain: Activity   Avoid bending and other activities that make the problem worse.  Maintain a proper position when standing or sitting: ? When standing, keep your upper back and neck straight, with your shoulders pulled back. Avoid slouching. ? When sitting, keep your back straight and relax your shoulders. Do not round your shoulders or pull them backward.  Do not sit or stand in one place for long periods of time.  Take brief periods of rest throughout the day. This will reduce your pain. Resting in a lying or standing position is usually better than sitting to  rest.  When you are resting for longer periods, mix in some mild activity or stretching between periods of rest. This will help to prevent stiffness and pain.  Get regular exercise. Ask your health care provider what activities are safe for you.  Do not lift anything that is heavier than 10 lb (4.5 kg). Always use proper lifting technique, which includes: ? Bending your knees. ? Keeping the load close to your body. ? Avoiding twisting.  Sleep on a firm mattress in a comfortable position. Try lying on your side with your knees slightly bent. If you lie on your back, put a pillow under your knees. Managing pain  If directed, apply ice to the painful area. Your health care provider may recommend applying ice during the first 24-48 hours after a flare-up begins. ? Put ice in a plastic bag. ? Place a towel between your skin and the bag. ? Leave the ice on for 20 minutes, 2-3 times per day.  If directed, apply heat to the affected area as often as told by your health care provider. Use the heat source that your health care provider recommends, such as a moist heat pack or a heating pad. ? Place a towel between your skin and the heat source. ? Leave the heat on for 20-30 minutes. ? Remove the heat if your skin turns bright red. This is especially important if you are unable to feel pain, heat, or cold. You may  have a greater risk of getting burned.  Try soaking in a warm tub.  Take over-the-counter and prescription medicines only as told by your health care provider.  Keep all follow-up visits as told by your health care provider. This is important. Contact a health care provider if:  You have pain that is not relieved with rest or medicine. Get help right away if:  You have weakness or numbness in one or both of your legs or feet.  You have trouble controlling your bladder or your bowels.  You have nausea or vomiting.  You have pain in your abdomen.  You have shortness of breath  or you faint. This information is not intended to replace advice given to you by your health care provider. Make sure you discuss any questions you have with your health care provider. Document Revised: 08/25/2018 Document Reviewed: 11/11/2016 Elsevier Patient Education  [place].

## 2019-07-20 NOTE — Assessment & Plan Note (Signed)
Diabetes longstanding since 2018 currently controlled based on blood glucose readings. Patient is adherent with medication. Patient confirms she is not experiencing hypoglycemic episodes. -Continued basal insulin Lantus (insulin glargine) 40 units once daily.  -Continued Ozempic (semaglutide) 0.5 mg weekly (Wednesdays). May consider titrating dose in the future for weight loss potential. -Extensively discussed pathophysiology of diabetes, recommended lifestyle interventions, dietary effects on blood sugar control -Next A1C anticipated 10/2019.

## 2019-07-20 NOTE — Progress Notes (Signed)
S:     Chief Complaint  Patient presents with  . Medication Management    DM    Patient arrives ambulating without assistance.  Presents for diabetes evaluation, education, and management Patient was referred and last seen by Primary Care Provider on 07/20/2019.  Patient was referred on 04/07/2019.  Patient was last seen by Primary Care Provider on 07/20/2019.   Patient reports Diabetes was diagnosed in 2018.   Family/Social History: No history dialysis or diabetes.  Insurance coverage/medication affordability: [facility] Medicare  Patient reports adherence with medications.  Current diabetes medications include: Lantus (insulin glargine) 40 units daily, Victoza (liraglutide) 1.8 mg daily Current hypertension medications include: Cozaar (losartan) 100 mg daily, Toprol-XL (metoprolol succinate) 100 mg daily, (Norvasc) amlodipine 5 mg daily Current hyperlipidemia medications include: rosuvastatin 40 mg daily  Patient denies hypoglycemic events.  Patient-reported exercise habits: patient experiencing back pain, likely limiting exercise.    O:  Physical Exam Constitutional:      Appearance: Normal appearance. She is obese.  Musculoskeletal:     Right lower leg: No edema.     Left lower leg: No edema.  Psychiatric:        Mood and Affect: Mood normal.        Behavior: Behavior normal.        Thought Content: Thought content normal.        Judgment: Judgment normal.    Review of Systems  Constitutional: Positive for malaise/fatigue.  Musculoskeletal: Positive for back pain.     Lab Results  Component Value Date   HGBA1C 9.1 (A) 07/20/2019   Vitals:   07/20/19 1457  BP: (!) 150/76  Pulse: 73  SpO2: 96%    Lipid Panel     Component Value Date/Time   CHOL 131 03/09/2019 1137   TRIG 121 03/09/2019 1137   HDL 55 03/09/2019 1137   CHOLHDL 2.4 03/09/2019 1137   LDLCALC 55 03/09/2019 1137   All blood sugars are within 100-200. Patient checking blood sugar  frequently.  Clinical Atherosclerotic Cardiovascular Disease (ASCVD): No  The 10-year ASCVD risk score Mikey Bussing DC Jr., et al., 2013) is: 25.3%   Values used to calculate the score:     Age: 74 years     Sex: Female     Is Non-Hispanic African American: Yes     Diabetic: Yes     Tobacco smoker: No     Systolic Blood Pressure: 283 mmHg     Is BP treated: Yes     HDL Cholesterol: 55 mg/dL     Total Cholesterol: 131 mg/dL    A/P: Diabetes longstanding since 2018 currently controlled based on blood glucose readings. Patient is adherent with medication. Patient confirms she is not experiencing hypoglycemic episodes. -Continued basal insulin Lantus (insulin glargine) 40 units once daily.  -Continued Ozempic (semaglutide) 0.5 mg weekly (Wednesdays). May consider titrating dose in the future for weight loss potential. -Extensively discussed pathophysiology of diabetes, recommended lifestyle interventions, dietary effects on blood sugar control -Next A1C anticipated 10/2019.   ASCVD risk - primary prevention in patient with diabetes. Last LDL is controlled. ASCVD risk score is >20%  - high intensity statin indicated. Aspirin is indicated.  -Continued aspirin 81 mg. -Continued rosuvastatin 40 mg.   Hypertension longstanding for multiple years currently uncontrolled.  Blood pressure goal = <140/90 mmHg, considering patient >49 years old with diabetes. Currently experiencing fatigue and pulse in low 70s thus limiting carvedilol increase. Patient is followed by nephrologist, will defer to  their expertise especially considering her kidney function. Patient medication adherence optimal.  Blood pressure control is suboptimal due to comorbidities. -Continue chlorthalidone 25 mg daily -Continue carvedilol 12.5 mg twice daily -Continue losartan 100 mg daily  Written patient instructions provided.  Total time in face to face counseling 50 minutes.   Follow up PCP Dr. Chauncey Reading in 3 months. Will await Dr.  Jerrol Banana expertise to determine if patient needs to be seen again within 1 month of follow-up PCP appointment. Advised patient to contact pharmacist if she experiences blood glucose levels <100 mg/dL or >200 mg/dL. Patient verbalized understanding.   Patient seen with Marin Roberts, PharmD PGY-1 Resident and Drexel Iha, PharmD,  PGY2 Pharmacy Resident.

## 2019-07-20 NOTE — Patient Instructions (Addendum)
-   No changes to current blood pressure medication regimen. - No changes to current diabetes medication regimen. - Contact Dr. Valentina Lucks if blood sugar levels <100. - Next visit in 3-4 months.

## 2019-07-21 NOTE — Progress Notes (Signed)
Reviewed: I agree with Dr. Koval's documentation and management. 

## 2019-07-22 ENCOUNTER — Encounter: Payer: Self-pay | Admitting: Family Medicine

## 2019-07-22 NOTE — Assessment & Plan Note (Signed)
Chronic back pain, believed to be musculoskeletal in nature, possibly also some arthritis. No imaging has been obtained at this point, will order x-rays to include SI joints (R-sided pain in lumbar area and SI joint). Pending x-rays, suspect that BMI 44 may be worsening arthritic pain and patient is deconditioned (cannot walk for more than 5-10 mins). Recommend patient work on weight loss, which will also help her DM. Patient is amenable to referral to nutrition. -continue tylenol, diclofenac gel, heat pads PRN -continue home exercise -X-rays of lumbar spine and SI joints ordered -Referral to Dr. Jenne Campus, nutrtion

## 2019-07-22 NOTE — Assessment & Plan Note (Signed)
Patient very near goal today <140/90 per JNC 8. Recommend no change in medications at this time.

## 2019-07-22 NOTE — Assessment & Plan Note (Signed)
A1c improved from 9.5-9.1%, continue current treatment.

## 2019-07-22 NOTE — Assessment & Plan Note (Signed)
CKD managed by [facility], patient stated her next appt is 3/18.

## 2019-07-22 NOTE — Assessment & Plan Note (Signed)
No flare ups.

## 2019-07-22 NOTE — Progress Notes (Signed)
SUBJECTIVE:   CHIEF COMPLAINT / HPI:  Back pain, follow up DM: Hgb A1c improved to 9.1%, last A1c 9.5% in 02/2019. Patient adherent to medication, no change at this time.  HTN: BP 142/78, very close to goal. No change to medication at this time  Gout: patient has had no flares, is taking her medication.  Back pain: patient has had chronic back pain, mostly on the right side, lumbar area. She was previously referred for PT and completed an evaluation and received a home exercise program, which she said she completed about 3 times per week, but hasn't continued as her back pain has gotten worse. Pain now also on Left lumbar side. At her last visit with Dr. Grandville Silos she tried baclofen, but did not like the way it made her feel so she stopped taking it. She uses heat, and prefers not to use topical ointments and lidocaine patches that have been recommended before. She states that her pain is at its worst when she is standing or walking for more than 10 minutes or so. The pain is aching in nature, does not radiate down her legs, no numbness or tingling, no incontinence.   PERTINENT  PMH / PSH: DMT2, HTN, Gout  OBJECTIVE:   BP (!) 142/78   Pulse 75   Wt 250 lb 6.4 oz (113.6 kg)   SpO2 99%   BMI 44.36 kg/m   Physical Exam Constitutional:      General: She is not in acute distress.    Appearance: She is obese. She is not ill-appearing.  HENT:     Head: Normocephalic and atraumatic.  Cardiovascular:     Rate and Rhythm: Normal rate and regular rhythm.     Pulses: Normal pulses.     Heart sounds: Normal heart sounds. No murmur. No friction rub. No gallop.   Pulmonary:     Effort: Pulmonary effort is normal.     Breath sounds: Normal breath sounds. No wheezing, rhonchi or rales.  Abdominal:     General: Abdomen is flat. Bowel sounds are normal.     Palpations: Abdomen is soft.     Tenderness: There is no abdominal tenderness.  Musculoskeletal:        General: Tenderness present. No  swelling or deformity.     Right lower leg: No edema.     Left lower leg: No edema.     Comments: SLR negative. Pain present when laying flat, relieved by bending knees. Tenderness to palpation in paraspinal muscles on R and L lumbar area, no stepoffs  Skin:    General: Skin is warm and dry.  Neurological:     General: No focal deficit present.     Mental Status: She is alert and oriented to person, place, and time. Mental status is at baseline.  Psychiatric:        Mood and Affect: Mood normal.        Behavior: Behavior normal.    ASSESSMENT/PLAN:  Ms. Wolf is a 74 yo woman with DMT2, gout, HTN, and chronic low back pain.  Gout No flare ups.  CKD (chronic kidney disease), stage III ([place]) CKD managed by [facility], patient stated her next appt is 3/18.  Type 2 diabetes mellitus with diabetic neuropathy, with long-term current use of insulin ([facility]) A1c improved from 9.5-9.1%, continue current treatment.  Essential hypertension Patient very near goal today <140/90 per JNC 8. Recommend no change in medications at this time.  Back pain Chronic back pain,  believed to be musculoskeletal in nature, possibly also some arthritis. No imaging has been obtained at this point, will order x-rays to include SI joints (R-sided pain in lumbar area and SI joint). Pending x-rays, suspect that BMI 44 may be worsening arthritic pain and patient is deconditioned (cannot walk for more than 5-10 mins). Recommend patient work on weight loss, which will also help her DM. Patient is amenable to referral to nutrition. -continue tylenol, diclofenac gel, heat pads PRN -continue home exercise -X-rays of lumbar spine and SI joints ordered -Referral to Dr. Jenne Campus, nutrtion   Gladys Damme, [place]

## 2019-08-01 DIAGNOSIS — E113391 Type 2 diabetes mellitus with moderate nonproliferative diabetic retinopathy without macular edema, right eye: Secondary | ICD-10-CM | POA: Diagnosis not present

## 2019-08-01 DIAGNOSIS — H3563 Retinal hemorrhage, bilateral: Secondary | ICD-10-CM | POA: Diagnosis not present

## 2019-08-01 DIAGNOSIS — H43822 Vitreomacular adhesion, left eye: Secondary | ICD-10-CM | POA: Diagnosis not present

## 2019-08-01 DIAGNOSIS — E113312 Type 2 diabetes mellitus with moderate nonproliferative diabetic retinopathy with macular edema, left eye: Secondary | ICD-10-CM | POA: Diagnosis not present

## 2019-08-03 DIAGNOSIS — E1122 Type 2 diabetes mellitus with diabetic chronic kidney disease: Secondary | ICD-10-CM | POA: Diagnosis not present

## 2019-08-03 DIAGNOSIS — N2581 Secondary hyperparathyroidism of renal origin: Secondary | ICD-10-CM | POA: Diagnosis not present

## 2019-08-03 DIAGNOSIS — N184 Chronic kidney disease, stage 4 (severe): Secondary | ICD-10-CM | POA: Diagnosis not present

## 2019-08-03 DIAGNOSIS — D631 Anemia in chronic kidney disease: Secondary | ICD-10-CM | POA: Diagnosis not present

## 2019-08-03 DIAGNOSIS — N189 Chronic kidney disease, unspecified: Secondary | ICD-10-CM | POA: Diagnosis not present

## 2019-08-03 DIAGNOSIS — I129 Hypertensive chronic kidney disease with stage 1 through stage 4 chronic kidney disease, or unspecified chronic kidney disease: Secondary | ICD-10-CM | POA: Diagnosis not present

## 2019-08-10 ENCOUNTER — Other Ambulatory Visit: Payer: Self-pay | Admitting: Family Medicine

## 2019-08-10 DIAGNOSIS — Z794 Long term (current) use of insulin: Secondary | ICD-10-CM

## 2019-08-10 DIAGNOSIS — E1140 Type 2 diabetes mellitus with diabetic neuropathy, unspecified: Secondary | ICD-10-CM

## 2019-08-14 ENCOUNTER — Other Ambulatory Visit: Payer: Self-pay | Admitting: Family Medicine

## 2019-08-14 DIAGNOSIS — I10 Essential (primary) hypertension: Secondary | ICD-10-CM

## 2019-08-15 ENCOUNTER — Ambulatory Visit (INDEPENDENT_AMBULATORY_CARE_PROVIDER_SITE_OTHER): Payer: Medicare Other | Admitting: Family Medicine

## 2019-08-15 ENCOUNTER — Other Ambulatory Visit: Payer: Self-pay

## 2019-08-15 DIAGNOSIS — Z6841 Body Mass Index (BMI) 40.0 and over, adult: Secondary | ICD-10-CM

## 2019-08-15 DIAGNOSIS — Z794 Long term (current) use of insulin: Secondary | ICD-10-CM

## 2019-08-15 DIAGNOSIS — E1140 Type 2 diabetes mellitus with diabetic neuropathy, unspecified: Secondary | ICD-10-CM | POA: Diagnosis not present

## 2019-08-15 NOTE — Patient Instructions (Addendum)
  Diet Recommendations for Diabetes  Carbohydrate includes starch, sugar, and fiber.  Of these, only sugar and starch raise blood glucose.  (Fiber is found in fruits, vegetables [especially skin, seeds, and stalks], whole grains, and beans.)   Starchy (carb) foods: Bread, rice, pasta, potatoes, corn, cereal, grits, crackers, bagels, muffins, all baked goods.  (Fruit, milk, and yogurt also have carbohydrate, but most of these foods will not spike your blood sugar as most starchy foods will.)  A few fruits do cause high blood sugars; use small portions of bananas (limit to 1/2 at a time), grapes, watermelon, oranges, and most tropical fruits.   Protein foods: Meat, fish, poultry, eggs, dairy foods, and beans such as pinto and kidney beans (beans also provide carbohydrate).   1. Eat at least 3 REAL meals and 1 snack per day. Eat breakfast within the first hour of getting up.  Have something to eat at least every 5 hours while awake.  Meals can be small, such as an egg and a piece of toast or yogurt.   2. Limit starchy foods to TWO per meal and ONE per snack. ONE portion of a starchy food is equal to the following:   - ONE slice of bread (or its equivalent, such as half of a hamburger bun).   - 1/2 cup of a "scoopable" starchy food such as potatoes or rice.   - 15 grams of Total Carbohydrate as shown on food label.   - Every 4 ounces of a sweet drink (including fruit juice). 3. Include at every lunch and dinner: a protein food, a carb food, and vegetables.   - Obtain twice the volume of veg's as protein or carbohydrate foods for both lunch and dinner.   - Fresh or frozen vegetables are best.   - Keep frozen vegetables on hand for a quick option, prepared in the microwave.      Follow-up: Telehealth visit via [place] on Tuesday, April 20 at 1:30 PM.

## 2019-08-15 NOTE — Progress Notes (Signed)
Telehealth Encounter I connected with Michelle Abbott (MRN [id number]) on 08/15/2019 by MyChart video-enabled, HIPAA-compliant telemedicine application, verified that I am speaking with the correct person using two identifiers, and that the patient was in a private environment conducive to confidentiality.  The patient agreed to proceed.  Provider was Kennith Center, PhD, RD, LDN, CEDRD Provider was located at [facility] during this telehealth encounter; patient was at home  Appt start time: 1530 end time: 1630 (1 hour)  Reason for telehealth visit: Referred by Dr. Chauncey Reading for Medical Nutrition Therapy to primarily address diabetes, obesity.    Relevant history/background: Michelle Abbott has used insulin for her Type2DM for several years, and suffers from diabetic neuropathies.  In addition, she has stage III CKD, obesity, hyperlipidemia, HTN, gout, and back pain.  A1C was 9.1 on 07/20/19, down from 9.5 in Oct 2020.   Assessment: Michelle Abbott does not have much of an appetite, so usually eats only once a day.  DSMT from many years ago became overwhelming, so she just stopped paying attn to her diet.  She has been trying to reduce intake of fried foods and "junk foods" since moving to [place] from [place] almost 4 years ago.  Usual eating pattern: 1 meals and 0-1 snack per day. Frequent foods and beverages: water, ~24 oz hot tea w/ 2-3 tsp sugar (or 1 swt 'n low + 1 sugar), ~12 oz ginger ale; potatoes, noodles, canned greens or other veg ~3 X wk; fruit ~5 X wk, eggs & bacon 2 X wk, meat daily.   Avoided foods: Asparagus, eggplant; avoids liver, pork chops, pigs feet, chitterlings, some fish b/c of gout.   Usual physical activity: ADLs; back pain precludes much walking.  Can't stand or sit long b/c of back pain.   Sleep: Estimates she gets ~6 hrs sleep/night.  To bed ~2 AM, gets up at 8 AM.   24-hr recall:  (Up at 8 AM) B ( AM)-  Water  Snk ( AM)-  --- L ( PM)-  --- Snk ( PM)-  --- D (4:30  PM)-  1 1/2 hotdogs on buns, chili, mustard, slaw, 2 chx nuggets, 1 c fries, 16 oz soda = approx 9 carb portions  Snk (6 PM)-  Water   Typical day? Yes.   Except usually drinks ~48 oz water daily; has brunch as only meal ~1 X wk.    Intervention: Completed dieta and physical activity history, and established behavioral goals designed to promote better glycemic control and weight loss.    For recommendations and goals, see Patient Instructions.    Follow-up: Telehealth visit in 3 weeks.    Kymberlie Brazeau,JEANNIE

## 2019-08-18 ENCOUNTER — Ambulatory Visit
Admission: RE | Admit: 2019-08-18 | Discharge: 2019-08-18 | Disposition: A | Discharge status: Home / Self Care | Payer: Medicare Other | Source: Ambulatory Visit | Attending: Family Medicine | Admitting: Family Medicine

## 2019-08-18 DIAGNOSIS — G8929 Other chronic pain: Secondary | ICD-10-CM

## 2019-08-18 DIAGNOSIS — M48061 Spinal stenosis, lumbar region without neurogenic claudication: Secondary | ICD-10-CM | POA: Diagnosis not present

## 2019-08-18 DIAGNOSIS — M533 Sacrococcygeal disorders, not elsewhere classified: Secondary | ICD-10-CM | POA: Diagnosis not present

## 2019-08-23 ENCOUNTER — Telehealth: Payer: Self-pay | Admitting: Family Medicine

## 2019-08-23 DIAGNOSIS — M5137 Other intervertebral disc degeneration, lumbosacral region: Secondary | ICD-10-CM

## 2019-08-23 DIAGNOSIS — M5136 Other intervertebral disc degeneration, lumbar region: Secondary | ICD-10-CM

## 2019-08-23 NOTE — Telephone Encounter (Signed)
Discussed results of xrays with patient, she has degenerative changes from L4-L5 and L5-S1. She is a possible candidate for injections, offered to refer her to orthopedics for further evaluation and possible treatment. Patient would like to be referred to orthopedics. Discussed with her that the multipronged approach of home excercises from PT, nutrition and weight loss, as well as orthopedics evaluation will ultimately give best results.  Gladys Damme, MD [facility], PGY-1

## 2019-08-31 ENCOUNTER — Other Ambulatory Visit: Payer: Self-pay

## 2019-08-31 ENCOUNTER — Encounter: Payer: Self-pay | Admitting: Physician Assistant

## 2019-08-31 ENCOUNTER — Ambulatory Visit (INDEPENDENT_AMBULATORY_CARE_PROVIDER_SITE_OTHER): Payer: Medicare Other | Admitting: Physician Assistant

## 2019-08-31 DIAGNOSIS — M545 Low back pain: Secondary | ICD-10-CM | POA: Diagnosis not present

## 2019-08-31 DIAGNOSIS — G8929 Other chronic pain: Secondary | ICD-10-CM

## 2019-08-31 MED ORDER — DIAZEPAM 5 MG PO TABS: ORAL_TABLET | ORAL | 0 refills | Status: DC

## 2019-08-31 NOTE — Addendum Note (Signed)
Addended by: Michae Kava B on: 08/31/2019 03:54 PM   Modules accepted: Orders

## 2019-08-31 NOTE — Progress Notes (Addendum)
Office Visit Note   Patient: Michelle Abbott           Date of Birth: 08/17/1945           MRN: [id number] Visit Date: 08/31/2019              Requested by: Michelle Malay, MD [place],  [place] [address] PCP: Michelle Damme, MD   Assessment & Plan: Visit Diagnoses:  1. Chronic bilateral low back pain without sciatica     Plan: Given patient's radiographic findings on radiograph, continued worsening pain despite conservative measures would recommend MRI of the lumbar spine to rule out HNP as the source of her low back pain.  Also for planning for epidural steroid injection versus facet injections lumbar spine.  She is claustrophobic therefore we will send in some Valium for her to take at the time of the MRI.  She will follow-up with [place] after the MRI to go over results discuss further treatment.  Questions were encouraged and answered.  Follow-Up Instructions: Return After MRI.   Orders:  No orders of the defined types were placed in this encounter.  Meds ordered this encounter  Medications  . DISCONTD: diazepam (VALIUM) 5 MG tablet    Sig: TAKE ONE TAB ONE HOUR PRIOR TO MRI REPEAT AS NEEDED #2 . ZERO REFILLS    Dispense:  2 tablet    Refill:  0  . diazepam (VALIUM) 5 MG tablet    Sig: TAKE ONE TAB ONE HOUR PRIOR TO MRI REPEAT AS NEEDED #2 . ZERO REFILLS    Dispense:  2 tablet    Refill:  0      Procedures: No procedures performed   Clinical Data: No additional findings.   Subjective: Chief Complaint  Patient presents with  . Lower Back - Pain    HPI Michelle Abbott is 74 year old female were seen for low back pain that is been ongoing for several years.  She has had no new injury.  She does state that her pain is becoming more frequent and more severe.  Pain is worse with prolonged ambulation.  Also if she sits too long the pain is worse.  She denies any radicular symptoms down either leg.  Pain is in the lower lumbar region worse on the right than  the left.  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  She denies any waking pain saddle anesthesia like symptoms bowel bladder dysfunction.  She has tried physical therapy which would help for short period of time after therapy each visit but then her pain would come back.  She continues to do the home exercise program as shown by therapy but states she does not do it whenever she is hurting really bad as far as her back goes.  She has tried tramadol for the pain.  She is also tried a muscle relaxant which she states "did not agree with her".  She denies will take NSAIDs due to his stage III kidney disease and also she is a diabetic with poor control however is becoming better controlled.  She reports her last hemoglobin A1c this month was 9.1. Radiographs complete L-spine dated 08/18/2019 personally reviewed.  Shows with multilevel degenerative changes.  She has a grade 1 anterior spondylolisthesis L4 on 5.  Disc space narrowing at L4-5 and L5-S1.  Facet arthritic changes L4-5 and L5-S1.  No acute fractures. Review of Systems  Please see HPI otherwise negative or noncontributory.  Objective: Vital Signs: There were no vitals taken for this visit.  Physical Exam Constitutional:      Appearance: She is not ill-appearing or diaphoretic.  Pulmonary:     Effort: Pulmonary effort is normal.  Neurological:     Mental Status: She is alert and oriented to person, place, and time.  Psychiatric:        Mood and Affect: Mood normal.     Ortho Exam Lower extremities 5 out of 5 strength throughout against resistance.  Positive straight leg raise on the right negative on the left.  Tenderness right lower lumbar paraspinous region.  She has full forward flexion lumbar spine with pain and has difficulty standing back up.  She has limited extension lumbar spine.  Deep tendon reflexes are 2+ at the knees and ankles and equal and symmetric  bilaterally.  Dorsal pedal pulses are 2+ bilaterally equal symmetric.  Sensation grossly intact bilateral feet light touch.  Calf supple nontender.  Good range of motion bilateral hips without pain. Specialty Comments:  No specialty comments available.  Imaging: No results found.   PMFS History: Patient Active Problem List   Diagnosis Date Noted  . Encounter for screening mammogram for malignant neoplasm of breast 06/30/2019  . Age-related hearing loss 03/12/2019  . SI (sacroiliac) joint inflammation ([place]) 03/12/2019  . Need for immunization against influenza 03/12/2019  . UTI (urinary tract infection) 02/02/2019  . Sciatica 11/17/2016  . Echocardiogram shows left ventricular diastolic dysfunction 67/67/2094  . CKD (chronic kidney disease), stage III ([place]) 07/21/2016  . Type 2 diabetes mellitus with diabetic neuropathy, with long-term current use of insulin ([place]) 06/15/2016  . Gout 06/15/2016  . Essential hypertension 06/15/2016  . Anemia 06/15/2016  . Vitamin D deficiency 06/15/2016  . Osteoarthritis 06/15/2016  . Hyperlipemia 06/15/2016  . Back pain 06/15/2016   Past Medical History:  Diagnosis Date  . Anemia   . Arthritis   . Chronic kidney disease   . Diabetes mellitus without complication ([place])   . Gout 2017  . Hyperlipidemia   . Hypertension   . Sciatica   . Vitamin D deficiency 2014    Family History  Problem Relation Age of Onset  . Cancer Mother        ovarian and pancreatic  . Diabetes Mother   . Stroke Mother   . COPD Father   . Arthritis Father   . Cancer Brother        Lung  . COPD Brother     Past Surgical History:  Procedure Laterality Date  . BREAST SURGERY Right 1961   Benign lump removed  . CATARACT EXTRACTION, BILATERAL Bilateral 2017  . CESAREAN SECTION  1985  . CESAREAN SECTION  1989  . CHOLECYSTECTOMY  1999  . DILATION AND CURETTAGE OF UTERUS  2017  . KNEE ARTHROSCOPY Right   . VAGINAL SEPTOPLASTY  1966   Social History    Occupational History  . Occupation: Art therapist     Comment: Developmentally disabled persons  Tobacco Use  . Smoking status: Former Smoker    Packs/day: 1.50    Years: 30.00    Pack years: 45.00    Types: Cigarettes    Quit date: 05/18/1997    Years since quitting: 22.3  . Smokeless tobacco: Never Used  . Tobacco comment: 1.5 - 2 ppd for 30 years:   no plans to re-start  Substance and Sexual Activity  . Alcohol use: No  .  Drug use: No  . Sexual activity: Not Currently

## 2019-08-31 NOTE — Addendum Note (Signed)
Addended by: Erskine Emery on: 08/31/2019 03:03 PM   Modules accepted: Orders

## 2019-09-05 ENCOUNTER — Other Ambulatory Visit: Payer: Self-pay

## 2019-09-05 ENCOUNTER — Ambulatory Visit (INDEPENDENT_AMBULATORY_CARE_PROVIDER_SITE_OTHER): Payer: Medicare Other | Admitting: Family Medicine

## 2019-09-05 DIAGNOSIS — E1140 Type 2 diabetes mellitus with diabetic neuropathy, unspecified: Secondary | ICD-10-CM

## 2019-09-05 DIAGNOSIS — Z794 Long term (current) use of insulin: Secondary | ICD-10-CM

## 2019-09-05 NOTE — Patient Instructions (Addendum)
Remember ginger ale is 100% sugar.  Every 6 ounces of ginger ale provides 15 grams of carbohydrate, which is equal to 1 carb portion.  (Most other sodas have 15 grams per only 4 ounces.)  Aim for at least 48 oz of water per day.    Reminder: Limit starchy foods to 2 portions per meal.   For each lunch and dinner, include a protein food, a starch food (2 portions maximum), and some vegetables.    From 08/15/19:  Diet Recommendations for Diabetes  Carbohydrate includes starch, sugar, and fiber.  Of these, only sugar and starch raise blood glucose.  (Fiber is found in fruits, vegetables [especially skin, seeds, and stalks], whole grains, and beans.)   Starchy (carb) foods: Bread, rice, pasta, potatoes, corn, cereal, grits, crackers, bagels, muffins, all baked goods.  (Fruit, milk, and yogurt also have carbohydrate, but most of these foods will not spike your blood sugar as most starchy foods will.)  A few fruits do cause high blood sugars; use small portions of bananas (limit to 1/2 at a time), grapes, watermelon, oranges, and most tropical fruits.   Protein foods: Meat, fish, poultry, eggs, dairy foods, and beans such as pinto and kidney beans (beans also provide carbohydrate).   1. Eat at least 3 REAL meals and 1 snack per day. Eat breakfast within the first hour of getting up.  Have something to eat at least every 5 hours while awake.  Meals can be small, such as an egg and a piece of toast or yogurt.   2. Limit starchy foods to TWO per meal and ONE per snack. ONE portion of a starchy food is equal to the following:              - ONE slice of bread (or its equivalent, such as half of a hamburger bun).              - 1/2 cup of a "scoopable" starchy food such as potatoes or rice.              - 15 grams of Total Carbohydrate as shown on food label.              - Every 4 ounces of a sweet drink (including fruit juice). 3. Include at every lunch and dinner: a protein food, a carb food, and  vegetables.              - Obtain twice the volume of veg's as protein or carbohydrate foods for both lunch and dinner.              - Fresh or frozen vegetables are best.              - Keep frozen vegetables on hand for a quick option, prepared in the microwave.    Follow-up: Call If you have questions or for appt when you feel ready:  [phone].

## 2019-09-05 NOTE — Progress Notes (Signed)
Telehealth Encounter I connected with Catheryne Deford (MRN [id number]) on 09/05/2019 by MyChart video-enabled, HIPAA-compliant telemedicine application, verified that I am speaking with the correct person using two identifiers, and that the patient was in a private environment conducive to confidentiality.  The patient agreed to proceed.  Provider was Kennith Center, PhD, RD, LDN, CEDRD Provider was located at [facility] during this telehealth encounter; patient was at home  Appt start time: 1330 end time: 1430 (1 hour)  Reason for telehealth visit: Referred by Dr. Chauncey Reading for Medical Nutrition Therapy to primarily address diabetes and obesity.    Relevant history/background: Ms. Kristiansen has used insulin for her Type2DM for several years, and suffers from diabetic neuropathies.  In addition, she has stage III CKD, obesity, hyperlipidemia, HTN, gout, and back pain.  A1C was 9.1 on 07/20/19, down from 9.5 in Oct 2020.   Assessment: Ms. Stefanko has been eating breakfast regularly now, but it's usually still late morning.  She still consumes ginger ale daily; amt varies.  Estimates she usually drinks 3 16-oz bottles of water per day.  Does not like the taste of tap water.   Recent eating pattern: 2 meals per day; bkfst is sometimes just 1 cup of yogurt. Recent physical activity: None currently; back pain precludes much walking.  Can't stand or sit long b/c of back pain.  Will be getting an MRI soon.   FBG: 119-140, and improvement over previous, which were 160's to 180's before starting Ozempic in March.   24-hr recall:  (Up at 8 AM; drank water with med's) B (11:30 AM)-  5 oz Yoplait yogurt, water Snk ( AM)-  --- L (3 PM)-  3 c Skinny popcorn, 16 oz ginger ale Snk (2 PM)-  1 peppermint patty D (8:30 PM)-  1 salad, drsng, 2 oz steak, ~10 Ritz crackers, 5 oz Yoplait yogurt, 8 oz ginger ale Snk ( PM)-  water Typical day? Yes.   Had eggs, toast with jelly, and bacon a couple days  last week.    Intervention: Completed dieta and physical activity history, and established behavioral goals designed to promote better glycemic control and weight loss.  Patient will be out of town; will call for follow-up appt or with questions as needed.    For recommendations and goals, see Patient Instructions.    Follow-up: prn.    Khalie Wince,JEANNIE

## 2019-09-09 ENCOUNTER — Telehealth: Payer: Self-pay | Admitting: Family Medicine

## 2019-09-09 DIAGNOSIS — Z794 Long term (current) use of insulin: Secondary | ICD-10-CM

## 2019-09-09 DIAGNOSIS — E1140 Type 2 diabetes mellitus with diabetic neuropathy, unspecified: Secondary | ICD-10-CM

## 2019-09-09 MED ORDER — LANTUS SOLOSTAR 100 UNIT/ML ~~LOC~~ SOPN: 40.0000 [IU] | PEN_INJECTOR | Freq: Every day | SUBCUTANEOUS | 5 refills | Status: DC

## 2019-09-09 NOTE — Telephone Encounter (Signed)
[facility] Emergency After Hours Line:  Patient calls after hours line in regards to her insulin. She notes her lantus is expired and is in need for more Lantus. She is wondering if it is okay to use this or if she can get a prescription sent to her pharmacy.  She notes that she called on Monday requesting a refill but never heard anything back.  She does note that her blood sugars have been well controlled using the expired Lantus.  She is not having any concerning symptoms.  Plan: Rx for Lantus 40 units subcu daily ordered and sent to pharmacy for patient. Provided reassurance that using expired Lantus is unlikely to be harmful to her.  Expect that it would be less effective given that it is expired.  Recommend that once she obtains her new prescription that she discard her expired prescription. Patient voiced understanding and agreement with plan.

## 2019-09-11 ENCOUNTER — Other Ambulatory Visit: Payer: Self-pay | Admitting: *Deleted

## 2019-09-11 DIAGNOSIS — E1140 Type 2 diabetes mellitus with diabetic neuropathy, unspecified: Secondary | ICD-10-CM

## 2019-09-11 MED ORDER — LANTUS SOLOSTAR 100 UNIT/ML ~~LOC~~ SOPN: 40.0000 [IU] | PEN_INJECTOR | Freq: Every day | SUBCUTANEOUS | 5 refills | Status: DC

## 2019-09-11 NOTE — Telephone Encounter (Signed)
Prescription was sent to local pharmacy yesterday since patient was out of medication.  Request is now coming from her mail order pharmacy.  Adessa Primiano,CMA

## 2019-09-19 ENCOUNTER — Telehealth: Payer: Self-pay | Admitting: Orthopaedic Surgery

## 2019-09-19 NOTE — Telephone Encounter (Signed)
LM on voicemail for patient to schedule her MRI review with either Dr. Ninfa Linden or Artis Delay after May 10th.  Thank you.

## 2019-09-25 ENCOUNTER — Ambulatory Visit
Admission: RE | Admit: 2019-09-25 | Discharge: 2019-09-25 | Disposition: A | Discharge status: Home / Self Care | Payer: Medicare Other | Source: Ambulatory Visit | Attending: Physician Assistant | Admitting: Physician Assistant

## 2019-09-25 DIAGNOSIS — M48061 Spinal stenosis, lumbar region without neurogenic claudication: Secondary | ICD-10-CM | POA: Diagnosis not present

## 2019-09-25 DIAGNOSIS — G8929 Other chronic pain: Secondary | ICD-10-CM

## 2019-09-27 ENCOUNTER — Ambulatory Visit: Payer: Medicare Other | Admitting: Orthopaedic Surgery

## 2019-10-03 ENCOUNTER — Ambulatory Visit (INDEPENDENT_AMBULATORY_CARE_PROVIDER_SITE_OTHER): Payer: Medicare Other | Admitting: Physician Assistant

## 2019-10-03 ENCOUNTER — Other Ambulatory Visit: Payer: Self-pay

## 2019-10-03 ENCOUNTER — Encounter: Payer: Self-pay | Admitting: Orthopaedic Surgery

## 2019-10-03 DIAGNOSIS — M48061 Spinal stenosis, lumbar region without neurogenic claudication: Secondary | ICD-10-CM

## 2019-10-03 NOTE — Progress Notes (Signed)
Office Visit Note   Patient: Michelle Abbott           Date of Birth: [birth year]           MRN: [id number] Visit Date: 10/03/2019              Requested by: Gladys Damme, MD [address],  [place] [address] PCP: Gladys Damme, MD   Assessment & Plan: Visit Diagnoses:  1. Degenerative lumbar spinal stenosis     Plan: After going over the MRI with the patient and please describe the findings with her in depth recommend epidural steroid injection lumbar spine.  We will have her undergo epidural steroid injection lumbar spine with Dr. Ernestina Patches to follow-up with [place] a month after the injection.  Questions were encouraged and answered at length today.  Follow-Up Instructions: Return 1 month after ESI.   Orders:  No orders of the defined types were placed in this encounter.  No orders of the defined types were placed in this encounter.     Procedures: No procedures performed   Clinical Data: No additional findings.   Subjective: Chief Complaint  Patient presents with  . Lower Back - Follow-up    HPI Michelle Abbott returns today to go over the MRI of her lumbar spine.  She continues to have low back pain but is now developing some radicular symptoms down the right leg.  She describes to the achy-like sensation down to her right knee lateral aspect of the thigh.  No radicular symptoms down the left leg.  She states she is also unable to sleep due to the pain in her low back and down the right leg.  She denies any bowel or bladder dysfunction denies any saddle anesthesia like symptoms. MRI of the lumbar spine is reviewed with the patient images are reviewed with the patient along with a spinal model is used for visualization.  Significant findings of L3-4 moderate spinal canal stenosis and severe right foraminal stenosis.  L4-5 grade 1 spondylolisthesis with mild disc bulge.  L4-5 severe facet hypertrophy.  This results in moderate right and left severe neuroforaminal  stenosis.  Also some severe spinal stenosis at this level.  L4-5 moderate facet arthrosis arthritic changes with mild bilateral neuroforaminal stenosis. Review of Systems See HPI otherwise negative  Objective: Vital Signs: There were no vitals taken for this visit.  Physical Exam General: Well-developed well-nourished female no acute distress mood affect appropriate Psych: Alert and oriented x3 Ortho Exam Lower extremities 5 out of 5 strength throughout against resistance Positive straight leg raise on the right and negative on the left.  Specialty Comments:  No specialty comments available.  Imaging: No results found.   PMFS History: Patient Active Problem List   Diagnosis Date Noted  . Encounter for screening mammogram for malignant neoplasm of breast 06/30/2019  . Age-related hearing loss 03/12/2019  . SI (sacroiliac) joint inflammation ([place]) 03/12/2019  . Need for immunization against influenza 03/12/2019  . UTI (urinary tract infection) 02/02/2019  . Sciatica 11/17/2016  . Echocardiogram shows left ventricular diastolic dysfunction 03/00/9233  . CKD (chronic kidney disease), stage III ([place]) 07/21/2016  . Type 2 diabetes mellitus with diabetic neuropathy, with long-term current use of insulin ([place]) 06/15/2016  . Gout 06/15/2016  . Essential hypertension 06/15/2016  . Anemia 06/15/2016  . Vitamin D deficiency 06/15/2016  . Osteoarthritis 06/15/2016  . Hyperlipemia 06/15/2016  . Back pain 06/15/2016   Past Medical History:  Diagnosis Date  . Anemia   .  Arthritis   . Chronic kidney disease   . Diabetes mellitus without complication ([place])   . Gout 2017  . Hyperlipidemia   . Hypertension   . Sciatica   . Vitamin D deficiency 2014    Family History  Problem Relation Age of Onset  . Cancer Mother        ovarian and pancreatic  . Diabetes Mother   . Stroke Mother   . COPD Father   . Arthritis Father   . Cancer Brother        Lung  . COPD Brother     Past  Surgical History:  Procedure Laterality Date  . BREAST SURGERY Right 1961   Benign lump removed  . CATARACT EXTRACTION, BILATERAL Bilateral 2017  . CESAREAN SECTION  1985  . CESAREAN SECTION  1989  . CHOLECYSTECTOMY  1999  . DILATION AND CURETTAGE OF UTERUS  2017  . KNEE ARTHROSCOPY Right   . VAGINAL SEPTOPLASTY  1966   Social History   Occupational History  . Occupation: Art therapist     Comment: Developmentally disabled persons  Tobacco Use  . Smoking status: Former Smoker    Packs/day: 1.50    Years: 30.00    Pack years: 45.00    Types: Cigarettes    Quit date: 05/18/1997    Years since quitting: 22.3  . Smokeless tobacco: Never Used  . Tobacco comment: 1.5 - 2 ppd for 30 years:   no plans to re-start  Substance and Sexual Activity  . Alcohol use: No  . Drug use: No  . Sexual activity: Not Currently

## 2019-10-04 ENCOUNTER — Other Ambulatory Visit: Payer: Self-pay | Admitting: Radiology

## 2019-10-04 DIAGNOSIS — M48061 Spinal stenosis, lumbar region without neurogenic claudication: Secondary | ICD-10-CM

## 2019-10-10 DIAGNOSIS — E113213 Type 2 diabetes mellitus with mild nonproliferative diabetic retinopathy with macular edema, bilateral: Secondary | ICD-10-CM | POA: Diagnosis not present

## 2019-10-31 ENCOUNTER — Ambulatory Visit (INDEPENDENT_AMBULATORY_CARE_PROVIDER_SITE_OTHER): Payer: Medicare Other | Admitting: Physical Medicine and Rehabilitation

## 2019-10-31 ENCOUNTER — Encounter: Payer: Self-pay | Admitting: Physical Medicine and Rehabilitation

## 2019-10-31 ENCOUNTER — Ambulatory Visit: Payer: Self-pay

## 2019-10-31 ENCOUNTER — Other Ambulatory Visit: Payer: Self-pay

## 2019-10-31 VITALS — BP 174/89 | HR 80

## 2019-10-31 DIAGNOSIS — M5416 Radiculopathy, lumbar region: Secondary | ICD-10-CM | POA: Diagnosis not present

## 2019-10-31 MED ORDER — METHYLPREDNISOLONE ACETATE 80 MG/ML IJ SUSP
40.0000 mg | Freq: Once | INTRAMUSCULAR | Status: AC
  Administered 2019-10-31: 40 mg

## 2019-10-31 NOTE — Progress Notes (Signed)
Michelle Abbott - 74 y.o. female MRN [id number]  Date of birth: 1945/10/23  Office Visit Note: Visit Date: 10/31/2019 PCP: Gladys Damme, MD Referred by: Gladys Damme, MD  Subjective: Chief Complaint  Patient presents with  . Lower Back - Pain  . Right Lower Leg - Pain  . Left Lower Leg - Pain   HPI: Michelle Abbott is a 74 y.o. female who comes in today At the request of Benita Stabile, PA-C for planned Right L5-S1 Lumbar epidural steroid injection with fluoroscopic guidance.  The patient has failed conservative care including home exercise, medications, time and activity modification.  This injection will be diagnostic and hopefully therapeutic.  Please see requesting physician notes for further details and justification.  Patient has pretty severe multifactorial stenosis at L4-.  Pending on relief with injection below this level would try transforaminal approach at the level of 6.  Her case is complicated by type 2 diabetes.  We will have an appointment for the next couple of weeks for potential L4 transforaminal injection.  She will follow up with Benita Stabile, P.A.-C several weeks after that injection.   ROS Otherwise per HPI.  Assessment & Plan: Visit Diagnoses:  1. Lumbar radiculopathy     Plan: No additional findings.   Meds & Orders:  Meds ordered this encounter  Medications  . methylPREDNISolone acetate (DEPO-MEDROL) injection 40 mg    Orders Placed This Encounter  Procedures  . XR C-ARM NO REPORT  . Epidural Steroid injection    Follow-up: Return in about 2 weeks (around 11/14/2019) for Transforaminal epidural if needed.   Procedures: No procedures performed  Lumbar Epidural Steroid Injection - Interlaminar Approach with Fluoroscopic Guidance  Patient: Michelle Abbott      Date of Birth: January 17, 1946 MRN: [id number] PCP: Gladys Damme, MD      Visit Date: 10/31/2019   Universal Protocol:     Consent Given By: the patient  Position: PRONE  Additional  Comments: Vital signs were monitored before and after the procedure. Patient was prepped and draped in the usual sterile fashion. The correct patient, procedure, and site was verified.   Injection Procedure Details:  Procedure Site One Meds Administered:  Meds ordered this encounter  Medications  . methylPREDNISolone acetate (DEPO-MEDROL) injection 40 mg     Laterality: Right  Location/Site:  L5-S1  Needle size: 20 G, 4.5 in  Needle type: Tuohy  Needle Placement: Paramedian epidural  Findings:   -Comments: Good flow of contrast without any vascular flow low with apparent flow of contrast into the epidural space on the oblique view.  Very tight area with a lot of spondylosis.  Procedure Details: Using a paramedian approach from the side mentioned above, the region overlying the inferior lamina was localized under fluoroscopic visualization and the soft tissues overlying this structure were infiltrated with 4 ml. of 1% Lidocaine without Epinephrine. The Tuohy needle was inserted into the epidural space using a paramedian approach.   The epidural space was localized using loss of resistance along with lateral and bi-planar fluoroscopic views.  After negative aspirate for air, blood, and CSF, a 2 ml. volume of Isovue-250 was injected into the epidural space and the flow of contrast was observed. Radiographs were obtained for documentation purposes.    The injectate was administered into the level noted above.   Additional Comments:   Dressing: 2 x 2 sterile gauze and Band-Aid    Post-procedure details: Patient was observed during the procedure. Post-procedure instructions were reviewed.  Patient left  the clinic in stable condition.     Clinical History: CLINICAL DATA:  Low back pain radiating to the right lower extremity  EXAM: MRI LUMBAR SPINE WITHOUT CONTRAST  TECHNIQUE: Multiplanar, multisequence MR imaging of the lumbar spine was performed. No intravenous  contrast was administered.  COMPARISON:  None.  FINDINGS: Segmentation:  Standard.  Alignment:  Grade 1 anterolisthesis at L4-5  Vertebrae: No fracture, evidence of discitis, or bone lesion. L3 hemangioma.  Conus medullaris and cauda equina: Conus extends to the L2 level. Conus and cauda equina appear normal.  Paraspinal and other soft tissues: 3.1 cm right and 2.5 cm left renal cysts.  Disc levels:  The T11-12 level is imaged only in the sagittal plane and shows no spinal canal or neural foraminal stenosis.  T12-L1: Normal  L1-L2: Normal disc space and facet joints. There is no spinal canal stenosis. No neural foraminal stenosis.  L2-L3: Right asymmetric disc bulge there is no spinal canal stenosis. Mild right neural foraminal stenosis.  L3-L4: Right asymmetric diffuse disc bulge. Moderate spinal canal stenosis. Severe right neural foraminal stenosis.  L4-L5: Severe facet hypertrophy with mild disc bulge. Severe spinal canal stenosis. Moderate right and severe left neural foraminal stenosis.  L5-S1: Moderate facet hypertrophy with intermediate disc bulge. There is no spinal canal stenosis. Mild bilateral neural foraminal stenosis.  Visualized sacrum: Normal.  IMPRESSION: 1. L4-L5 severe spinal canal stenosis with moderate right and severe left neural foraminal stenosis secondary to combination of anterolisthesis, disc bulge and facet arthrosis. 2. L3-L4 moderate spinal canal stenosis and severe right neural foraminal stenosis. 3. L5-S1 mild bilateral neural foraminal stenosis.   Electronically Signed   By: Ulyses Jarred M.D.   On: 09/26/2019 00:21   She reports that she quit smoking about 22 years ago. Her smoking use included cigarettes. She has a 45.00 pack-year smoking history. She has never used smokeless tobacco.  Recent Labs    11/03/18 1442 11/03/18 1457 03/09/19 1014 05/09/19 1114 07/20/19 1341  HGBA1C   < >  --  9.5* 9.3*  9.1*  LABURIC  --  6.4  --   --   --    < > = values in this interval not displayed.    Objective:  VS:  HT:    WT:   BMI:     BP:(!) 174/89  HR:80bpm  TEMP: ( )  RESP:  Physical Exam Constitutional:      General: She is not in acute distress.    Appearance: Normal appearance. She is not ill-appearing.  HENT:     Head: Normocephalic and atraumatic.     Right Ear: External ear normal.     Left Ear: External ear normal.  Eyes:     Extraocular Movements: Extraocular movements intact.  Cardiovascular:     Rate and Rhythm: Normal rate.     Pulses: Normal pulses.  Musculoskeletal:     Right lower leg: No edema.     Left lower leg: No edema.     Comments: Patient has good distal strength with no pain over the greater trochanters.  No clonus or focal weakness.  Skin:    Findings: No erythema, lesion or rash.  Neurological:     General: No focal deficit present.     Mental Status: She is alert and oriented to person, place, and time.     Sensory: No sensory deficit.     Motor: No weakness or abnormal muscle tone.     Coordination: Coordination normal.  Psychiatric:  Mood and Affect: Mood normal.        Behavior: Behavior normal.     Ortho Exam  Imaging: No results found.  Past Medical/Family/Surgical/Social History: Medications & Allergies reviewed per EMR, new medications updated. Patient Active Problem List   Diagnosis Date Noted  . Encounter for screening mammogram for malignant neoplasm of breast 06/30/2019  . Age-related hearing loss 03/12/2019  . SI (sacroiliac) joint inflammation ([place]) 03/12/2019  . Need for immunization against influenza 03/12/2019  . UTI (urinary tract infection) 02/02/2019  . Sciatica 11/17/2016  . Echocardiogram shows left ventricular diastolic dysfunction 27/51/7001  . CKD (chronic kidney disease), stage III ([place]) 07/21/2016  . Type 2 diabetes mellitus with diabetic neuropathy, with long-term current use of insulin ([place]) 06/15/2016    . Gout 06/15/2016  . Essential hypertension 06/15/2016  . Anemia 06/15/2016  . Vitamin D deficiency 06/15/2016  . Osteoarthritis 06/15/2016  . Hyperlipemia 06/15/2016  . Back pain 06/15/2016   Past Medical History:  Diagnosis Date  . Anemia   . Arthritis   . Chronic kidney disease   . Diabetes mellitus without complication ([place])   . Gout 2017  . Hyperlipidemia   . Hypertension   . Sciatica   . Vitamin D deficiency 2014   Family History  Problem Relation Age of Onset  . Cancer Mother        ovarian and pancreatic  . Diabetes Mother   . Stroke Mother   . COPD Father   . Arthritis Father   . Cancer Brother        Lung  . COPD Brother    Past Surgical History:  Procedure Laterality Date  . BREAST SURGERY Right 1961   Benign lump removed  . CATARACT EXTRACTION, BILATERAL Bilateral 2017  . CESAREAN SECTION  1985  . CESAREAN SECTION  1989  . CHOLECYSTECTOMY  1999  . DILATION AND CURETTAGE OF UTERUS  2017  . KNEE ARTHROSCOPY Right   . VAGINAL SEPTOPLASTY  1966   Social History   Occupational History  . Occupation: Art therapist     Comment: Developmentally disabled persons  Tobacco Use  . Smoking status: Former Smoker    Packs/day: 1.50    Years: 30.00    Pack years: 45.00    Types: Cigarettes    Quit date: 05/18/1997    Years since quitting: 22.4  . Smokeless tobacco: Never Used  . Tobacco comment: 1.5 - 2 ppd for 30 years:   no plans to re-start  Vaping Use  . Vaping Use: Never used  Substance and Sexual Activity  . Alcohol use: No  . Drug use: No  . Sexual activity: Not Currently

## 2019-10-31 NOTE — Procedures (Signed)
Lumbar Epidural Steroid Injection - Interlaminar Approach with Fluoroscopic Guidance  Patient: Michelle Abbott      Date of Birth: 1945/06/17 MRN: [id number] PCP: Gladys Damme, MD      Visit Date: 10/31/2019   Universal Protocol:     Consent Given By: the patient  Position: PRONE  Additional Comments: Vital signs were monitored before and after the procedure. Patient was prepped and draped in the usual sterile fashion. The correct patient, procedure, and site was verified.   Injection Procedure Details:  Procedure Site One Meds Administered:  Meds ordered this encounter  Medications  . methylPREDNISolone acetate (DEPO-MEDROL) injection 40 mg     Laterality: Right  Location/Site:  L5-S1  Needle size: 20 G, 4.5 in  Needle type: Tuohy  Needle Placement: Paramedian epidural  Findings:   -Comments: Good flow of contrast without any vascular flow low with apparent flow of contrast into the epidural space on the oblique view.  Very tight area with a lot of spondylosis.  Procedure Details: Using a paramedian approach from the side mentioned above, the region overlying the inferior lamina was localized under fluoroscopic visualization and the soft tissues overlying this structure were infiltrated with 4 ml. of 1% Lidocaine without Epinephrine. The Tuohy needle was inserted into the epidural space using a paramedian approach.   The epidural space was localized using loss of resistance along with lateral and bi-planar fluoroscopic views.  After negative aspirate for air, blood, and CSF, a 2 ml. volume of Isovue-250 was injected into the epidural space and the flow of contrast was observed. Radiographs were obtained for documentation purposes.    The injectate was administered into the level noted above.   Additional Comments:   Dressing: 2 x 2 sterile gauze and Band-Aid    Post-procedure details: Patient was observed during the procedure. Post-procedure instructions  were reviewed.  Patient left the clinic in stable condition.

## 2019-10-31 NOTE — Progress Notes (Signed)
Pt states pain across the lower back that radiates into both lower legs. Pt states pain started years ago and has gotten worse in the past few months. Standing and walking makes pain worse. Tylenol helps with pain.   .Numeric Pain Rating Scale and Functional Assessment Average Pain 7   In the last MONTH (on 0-10 scale) has pain interfered with the following?  1. General activity like being  able to carry out your everyday physical activities such as walking, climbing stairs, carrying groceries, or moving a chair?  Rating(8)   +Driver, -BT, -Dye Allergies.

## 2019-11-05 ENCOUNTER — Other Ambulatory Visit: Payer: Self-pay

## 2019-11-05 ENCOUNTER — Encounter (HOSPITAL_COMMUNITY): Payer: Self-pay

## 2019-11-05 ENCOUNTER — Emergency Department (HOSPITAL_COMMUNITY)
Admission: EM | Admit: 2019-11-05 | Discharge: 2019-11-05 | Disposition: A | Discharge status: Home / Self Care | Payer: Medicare Other | Attending: Emergency Medicine | Admitting: Emergency Medicine

## 2019-11-05 DIAGNOSIS — Z7982 Long term (current) use of aspirin: Secondary | ICD-10-CM | POA: Insufficient documentation

## 2019-11-05 DIAGNOSIS — N183 Chronic kidney disease, stage 3 unspecified: Secondary | ICD-10-CM | POA: Insufficient documentation

## 2019-11-05 DIAGNOSIS — Z794 Long term (current) use of insulin: Secondary | ICD-10-CM | POA: Diagnosis not present

## 2019-11-05 DIAGNOSIS — Z79899 Other long term (current) drug therapy: Secondary | ICD-10-CM | POA: Insufficient documentation

## 2019-11-05 DIAGNOSIS — I129 Hypertensive chronic kidney disease with stage 1 through stage 4 chronic kidney disease, or unspecified chronic kidney disease: Secondary | ICD-10-CM | POA: Insufficient documentation

## 2019-11-05 DIAGNOSIS — Z87891 Personal history of nicotine dependence: Secondary | ICD-10-CM | POA: Diagnosis not present

## 2019-11-05 DIAGNOSIS — K625 Hemorrhage of anus and rectum: Secondary | ICD-10-CM | POA: Insufficient documentation

## 2019-11-05 DIAGNOSIS — E1122 Type 2 diabetes mellitus with diabetic chronic kidney disease: Secondary | ICD-10-CM | POA: Diagnosis not present

## 2019-11-05 DIAGNOSIS — E1140 Type 2 diabetes mellitus with diabetic neuropathy, unspecified: Secondary | ICD-10-CM | POA: Insufficient documentation

## 2019-11-05 LAB — URINALYSIS, ROUTINE W REFLEX MICROSCOPIC
Bilirubin Urine: NEGATIVE
Glucose, UA: NEGATIVE mg/dL
Ketones, ur: NEGATIVE mg/dL
Leukocytes,Ua: NEGATIVE
Nitrite: NEGATIVE
Protein, ur: >=300 mg/dL — AB
Specific Gravity, Urine: 1.01 (ref 1.005–1.030)
pH: 6 (ref 5.0–8.0)

## 2019-11-05 LAB — CBC
HCT: 33.6 % — ABNORMAL LOW (ref 36.0–46.0)
Hemoglobin: 9.6 g/dL — ABNORMAL LOW (ref 12.0–15.0)
MCH: 25.5 pg — ABNORMAL LOW (ref 26.0–34.0)
MCHC: 28.6 g/dL — ABNORMAL LOW (ref 30.0–36.0)
MCV: 89.4 fL (ref 80.0–100.0)
Platelets: 224 10*3/uL (ref 150–400)
RBC: 3.76 MIL/uL — ABNORMAL LOW (ref 3.87–5.11)
RDW: 15.7 % — ABNORMAL HIGH (ref 11.5–15.5)
WBC: 5.3 10*3/uL (ref 4.0–10.5)
nRBC: 0 % (ref 0.0–0.2)

## 2019-11-05 LAB — COMPREHENSIVE METABOLIC PANEL
ALT: 22 U/L (ref 0–44)
AST: 18 U/L (ref 15–41)
Albumin: 3.1 g/dL — ABNORMAL LOW (ref 3.5–5.0)
Alkaline Phosphatase: 66 U/L (ref 38–126)
Anion gap: 8 (ref 5–15)
BUN: 35 mg/dL — ABNORMAL HIGH (ref 8–23)
CO2: 25 mmol/L (ref 22–32)
Calcium: 9.1 mg/dL (ref 8.9–10.3)
Chloride: 108 mmol/L (ref 98–111)
Creatinine, Ser: 2.03 mg/dL — ABNORMAL HIGH (ref 0.44–1.00)
GFR calc Af Amer: 28 mL/min — ABNORMAL LOW (ref >60)
GFR calc non Af Amer: 24 mL/min — ABNORMAL LOW (ref >60)
Glucose, Bld: 125 mg/dL — ABNORMAL HIGH (ref 70–99)
Potassium: 4.8 mmol/L (ref 3.5–5.1)
Sodium: 141 mmol/L (ref 135–145)
Total Bilirubin: 0.5 mg/dL (ref 0.3–1.2)
Total Protein: 6.6 g/dL (ref 6.5–8.1)

## 2019-11-05 LAB — TYPE AND SCREEN
ABO/RH(D): O POS
Antibody Screen: NEGATIVE

## 2019-11-05 LAB — ABO/RH: ABO/RH(D): O POS

## 2019-11-05 LAB — POC OCCULT BLOOD, ED: Fecal Occult Bld: POSITIVE — AB

## 2019-11-05 NOTE — ED Provider Notes (Signed)
[facility] EMERGENCY DEPARTMENT Provider Note   CSN: [id number] Arrival date & time: 11/05/19  1322     History Chief Complaint  Patient presents with  . Rectal Bleeding    Michelle Abbott is a 74 y.o. female possible history of anemia, diabetes, hypertension, hyperlipidemia who presents for evaluation of episode of rectal bleeding.  She reports that this morning at about 3 AM, she got up to use the restroom.  She states that she noticed blood on her pad and some toilet paper.  She states that she went to have a bowel movement and noticed bright red blood with the bowel movement.  She states that when she wiped her vagina, she did not see any blood but when she right correct them, she had bright red blood on the toilet paper.  She had another episode where she wiped blood earlier this morning at about 11 AM but since then has not had any more.  She thinks it is coming from her rectum.  She states she has not noticed any blood with her urine or dysuria.  She states she did not have to strain with having a bowel movement.  She has not had any associated abdominal pain.  She is on blood thinners.  She reports her last colonoscopy was in 2019.  She states that they found a polyp and removed it.  She has not had any fevers, chest pain, difficulty breathing, abdominal pain, diarrhea, nausea/vomiting.  The history is provided by the patient.       Past Medical History:  Diagnosis Date  . Anemia   . Arthritis   . Chronic kidney disease   . Diabetes mellitus without complication ([place])   . Gout 2017  . Hyperlipidemia   . Hypertension   . Sciatica   . Vitamin D deficiency 2014    Patient Active Problem List   Diagnosis Date Noted  . Encounter for screening mammogram for malignant neoplasm of breast 06/30/2019  . Age-related hearing loss 03/12/2019  . SI (sacroiliac) joint inflammation ([place]) 03/12/2019  . Need for immunization against influenza 03/12/2019  . UTI  (urinary tract infection) 02/02/2019  . Sciatica 11/17/2016  . Echocardiogram shows left ventricular diastolic dysfunction 89/38/1017  . CKD (chronic kidney disease), stage III ([place]) 07/21/2016  . Type 2 diabetes mellitus with diabetic neuropathy, with long-term current use of insulin ([place]) 06/15/2016  . Gout 06/15/2016  . Essential hypertension 06/15/2016  . Anemia 06/15/2016  . Vitamin D deficiency 06/15/2016  . Osteoarthritis 06/15/2016  . Hyperlipemia 06/15/2016  . Back pain 06/15/2016    Past Surgical History:  Procedure Laterality Date  . BREAST SURGERY Right 1961   Benign lump removed  . CATARACT EXTRACTION, BILATERAL Bilateral 2017  . CESAREAN SECTION  1985  . CESAREAN SECTION  1989  . CHOLECYSTECTOMY  1999  . DILATION AND CURETTAGE OF UTERUS  2017  . KNEE ARTHROSCOPY Right   . VAGINAL SEPTOPLASTY  1966     OB History   No obstetric history on file.     Family History  Problem Relation Age of Onset  . Cancer Mother        ovarian and pancreatic  . Diabetes Mother   . Stroke Mother   . COPD Father   . Arthritis Father   . Cancer Brother        Lung  . COPD Brother     Social History   Tobacco Use  . Smoking status: Former Smoker  Packs/day: 1.50    Years: 30.00    Pack years: 45.00    Types: Cigarettes    Quit date: 05/18/1997    Years since quitting: 22.4  . Smokeless tobacco: Never Used  . Tobacco comment: 1.5 - 2 ppd for 30 years:   no plans to re-start  Vaping Use  . Vaping Use: Never used  Substance Use Topics  . Alcohol use: No  . Drug use: No    Home Medications Prior to Admission medications   Medication Sig Start Date End Date Taking? Authorizing Provider  ACCU-CHEK FASTCLIX LANCETS MISC TEST BLOOD SUGAR FOUR TIMES DAILY 08/24/17   Steve Rattler, DO  allopurinol (ZYLOPRIM) 100 MG tablet Take 1 tablet (100 mg total) by mouth daily. 04/07/19   Gladys Damme, MD  aspirin EC 81 MG tablet Take 1 tablet (81 mg total) by mouth daily.  10/25/17   Steve Rattler, DO  B-D ULTRAFINE III SHORT PEN 31G X 8 MM MISC Use to give insulin twice a day and victoza once a day. Dx code = E11.9 03/22/19   Gladys Damme, MD  carvedilol (COREG) 12.5 MG tablet Take 1 tablet (12.5 mg total) by mouth 2 (two) times daily with a meal. 04/11/19   Hensel, Jamal Collin, MD  chlorthalidone (HYGROTON) 25 MG tablet TAKE 1 TABLET BY MOUTH  DAILY 08/15/19   Gladys Damme, MD  cholecalciferol (VITAMIN D) 1000 units tablet Take 1,000 Units by mouth daily.    [provider]  colchicine 0.6 MG tablet Take 2 tablets (1.2 mg total) at onset of gout flare then take 1 tablet (0.6 mg) one hour later if symptoms persist. (MAX: 1.8 mg total daily) 04/12/19   Martyn Malay, MD  Cyanocobalamin (VITAMIN B-12 PO) Take 1,000 mcg by mouth daily.     [provider]  diazepam (VALIUM) 5 MG tablet TAKE ONE TAB ONE HOUR PRIOR TO MRI REPEAT AS NEEDED #2 . ZERO REFILLS 08/31/19   Pete Pelt, PA-C  ferrous sulfate 325 (65 FE) MG tablet Take 325 mg by mouth daily. 06/12/16   [provider]  glucose blood (ONETOUCH ULTRA) test strip 1 each by Other route daily. 07/10/19   Gladys Damme, MD  LANTUS SOLOSTAR 100 UNIT/ML Solostar Pen Inject 40 Units into the skin daily. Increase as instructed. Max dose 50 units. 09/11/19   Gifford Shave, MD  losartan (COZAAR) 100 MG tablet TAKE 1 TABLET BY MOUTH  DAILY 10/07/18   Steve Rattler, DO  Multiple Vitamin (MULTIVITAMIN WITH MINERALS) TABS tablet Take 1 tablet by mouth daily.    [provider]  OZEMPIC, 0.25 OR 0.5 MG/DOSE, 2 MG/1.5ML SOPN INJECT 0.5 MG INTO THE SKIN WEEKLY 07/07/19   Gladys Damme, MD  polyethylene glycol powder (GLYCOLAX/MIRALAX) powder Take 17 g by mouth 2 (two) times daily as needed. 12/28/16   Steve Rattler, DO  rosuvastatin (CRESTOR) 40 MG tablet Take 1 tablet (40 mg total) by mouth daily. 04/05/19   Gladys Damme, MD  traMADol (ULTRAM) 50 MG tablet Take 50 mg by  mouth every 6 (six) hours as needed (uses about 2x per month).    [provider]  Vitamin A 2400 MCG (8000 UT) CAPS Take 2,400 mcg by mouth daily.    [provider]    Allergies    Ciprofloxacin hcl, Metformin and related, Ace inhibitors, Amlodipine, and Baclofen  Review of Systems   Review of Systems  Constitutional: Negative for fever.  Respiratory: Negative  for cough and shortness of breath.   Cardiovascular: Negative for chest pain.  Gastrointestinal: Positive for blood in stool. Negative for abdominal pain, nausea and vomiting.  Genitourinary: Negative for dysuria and hematuria.  Neurological: Negative for headaches.  All other systems reviewed and are negative.   Physical Exam Updated Vital Signs BP (!) 190/81   Pulse 66   Temp 98.4 F (36.9 C) (Oral)   Resp 18   SpO2 99%   Physical Exam Vitals and nursing note reviewed. Exam conducted with a chaperone present.  Constitutional:      Appearance: Normal appearance. She is well-developed.  HENT:     Head: Normocephalic and atraumatic.  Eyes:     General: Lids are normal.     Conjunctiva/sclera: Conjunctivae normal.     Pupils: Pupils are equal, round, and reactive to light.  Cardiovascular:     Rate and Rhythm: Normal rate and regular rhythm.     Pulses: Normal pulses.     Heart sounds: Normal heart sounds. No murmur heard.  No friction rub. No gallop.   Pulmonary:     Effort: Pulmonary effort is normal.     Breath sounds: Normal breath sounds.  Abdominal:     Palpations: Abdomen is soft. Abdomen is not rigid.     Tenderness: There is no abdominal tenderness. There is no guarding.     Comments: Abdomen is soft, non-distended, non-tender. No rigidity, No guarding. No peritoneal signs.   Genitourinary:    Comments: The exam was performed with a chaperone present. Normal external female genitalia. No lesions, rash, or sores.  Small, nonthrombosed external hemorrhoids noted at the 3:00, 7:00  region.  No active bleeding.  External vaginal exam performed.  No evidence of gross bleeding. Musculoskeletal:        General: Normal range of motion.     Cervical back: Full passive range of motion without pain.  Skin:    General: Skin is warm and dry.     Capillary Refill: Capillary refill takes less than 2 seconds.  Neurological:     Mental Status: She is alert and oriented to person, place, and time.  Psychiatric:        Speech: Speech normal.     ED Results / Procedures / Treatments   Labs (all labs ordered are listed, but only abnormal results are displayed) Labs Reviewed  COMPREHENSIVE METABOLIC PANEL - Abnormal; Notable for the following components:      Result Value   Glucose, Bld 125 (*)    BUN 35 (*)    Creatinine, Ser 2.03 (*)    Albumin 3.1 (*)    GFR calc non Af Amer 24 (*)    GFR calc Af Amer 28 (*)    All other components within normal limits  CBC - Abnormal; Notable for the following components:   RBC 3.76 (*)    Hemoglobin 9.6 (*)    HCT 33.6 (*)    MCH 25.5 (*)    MCHC 28.6 (*)    RDW 15.7 (*)    All other components within normal limits  URINALYSIS, ROUTINE W REFLEX MICROSCOPIC - Abnormal; Notable for the following components:   Color, Urine STRAW (*)    Hgb urine dipstick MODERATE (*)    Protein, ur >=300 (*)    Bacteria, UA RARE (*)    All other components within normal limits  POC OCCULT BLOOD, ED - Abnormal; Notable for the following components:   Fecal Occult Bld POSITIVE (*)  All other components within normal limits  TYPE AND SCREEN  ABO/RH    EKG None  Radiology No results found.  Procedures Procedures (including critical care time)  Medications Ordered in ED Medications - No data to display  ED Course  I have reviewed the triage vital signs and the nursing notes.  Pertinent labs & imaging results that were available during my care of the patient were reviewed by me and considered in my medical decision making (see chart  for details).  Clinical Course as of Nov 04 2221  Sun Nov 05, 4423  [id number] 74 year old female here for bleeding from her periarea.  She is not sure if it is rectal vaginal or urine.  No pain.  She said she had polyps in her uterus few years ago.  No fevers or chills.  Well-appearing with stable hemoglobin.   [MB]    Clinical Course User Index [MB] Hayden Rasmussen, MD   MDM Rules/Calculators/A&P                           74 year old female who presents for evaluation of episode of rectal bleeding that occurred this morning at 3 AM with a bowel movement.  Initially states that she woke up this morning in had a bowel movement with some blood.  She states she had noticed some blood in her pad.  She states that a couple times earlier today when she wiped her rectum, she had blood.  She had no blood with wiping her vagina.  No dysuria.  Last episode of bleeding on the toilet paper was about 11 AM prior to coming to the emergency department.  She is not on blood thinners.  She reports colonoscopy in 2019.  She reports they removed a polyp.  Initially arrival, she is afebrile nontoxic-appearing.  Vital signs are stable.  Benign abdominal exam with no tenderness.  On rectal exam, she does have hemorrhoids.  Question she has some internal hemorrhoids that are causing her bleeding.  I did not see any gross melena on my digital rectal exam.  No active bleeding.  Additionally, external vaginal exam performed which showed no active bleeding.  History/physical exam not concerning for diverticulitis.  We will plan to check labs.  Fecal occult is positive.  CMP shows BUN of 25, creatinine of 2.03 which is her baseline.  UA shows moderate hemoglobin.  CBC shows hemoglobin of 9.6 which is her baseline.  She does have baseline history of anemia for which she takes iron pills for.  Discussed results with patient. She has not seen any more blood since being here in the ED. she had a colonoscopy in 2019 performed by [facility].  Will consult legal further recommendations.  At this time, patient is hemodynamically stable.  She is not on blood thinners.  Her hemoglobin is stable.  I suspect this most likely is coming from hemorrhoids.  Do not feel that this is related to diverticulitis or acute GI bleed.  At this time, feel that patient can be managed outpatient basis.  Discussed with Dr. Melina Copa who is agreeable to plan.  Discussed patient with Dr. Therisa Doyne [facility]).  Agrees with plan for outpatient management.  Patient has seen Dr. Penelope Coop in the past.  She will send a message to him to get patient outpatient follow-up.  Discussed results with patient.  She is agreeable. At this time, patient exhibits no emergent life-threatening condition that require further evaluation in  ED or admission. Patient had ample opportunity for questions and discussion. All patient's questions were answered with full understanding. Strict return precautions discussed. Patient expresses understanding and agreement to plan.   Portions of this note were generated with [employer]. Dictation errors may occur despite best attempts at proofreading.  Final Clinical Impression(s) / ED Diagnoses Final diagnoses:  Rectal bleeding    Rx / DC Orders ED Discharge Orders    None       Desma Mcgregor 11/05/19 2223    Hayden Rasmussen, MD 11/06/19 1148

## 2019-11-05 NOTE — ED Notes (Signed)
Pt given dc instructions pt verbalizes understanding.  

## 2019-11-05 NOTE — Discharge Instructions (Signed)
As we discussed, your work-up today looked reassuring.  Your hemoglobin was stable.  You do have hemorrhoids which could be contributing to rectal bleeding.  I have talked to your GI doctor.  They will plan for follow-up appointment.  If you have not heard within in the next couple days, call their office.  Return to emergency department for any worsening bleeding, abdominal pain, fever, feeling lightheaded or any other worsening or concerning symptoms.

## 2019-11-05 NOTE — ED Triage Notes (Signed)
Pt reports waking up at 0200 to use the restroom, noticed blood on the toilet paper, pt reports bright red blood ongoing through out the day.

## 2019-11-10 ENCOUNTER — Other Ambulatory Visit: Payer: Self-pay | Admitting: Family Medicine

## 2019-11-13 ENCOUNTER — Encounter: Payer: Medicare Other | Admitting: Physical Medicine and Rehabilitation

## 2019-11-28 ENCOUNTER — Ambulatory Visit: Payer: Medicare Other | Admitting: Family Medicine

## 2019-11-28 DIAGNOSIS — E113391 Type 2 diabetes mellitus with moderate nonproliferative diabetic retinopathy without macular edema, right eye: Secondary | ICD-10-CM | POA: Diagnosis not present

## 2019-11-28 DIAGNOSIS — H43813 Vitreous degeneration, bilateral: Secondary | ICD-10-CM | POA: Diagnosis not present

## 2019-11-28 DIAGNOSIS — E113312 Type 2 diabetes mellitus with moderate nonproliferative diabetic retinopathy with macular edema, left eye: Secondary | ICD-10-CM | POA: Diagnosis not present

## 2019-11-28 DIAGNOSIS — H43823 Vitreomacular adhesion, bilateral: Secondary | ICD-10-CM | POA: Diagnosis not present

## 2019-12-06 ENCOUNTER — Encounter: Payer: Self-pay | Admitting: Physical Medicine and Rehabilitation

## 2019-12-06 ENCOUNTER — Other Ambulatory Visit: Payer: Self-pay

## 2019-12-06 ENCOUNTER — Ambulatory Visit (INDEPENDENT_AMBULATORY_CARE_PROVIDER_SITE_OTHER): Payer: Medicare Other | Admitting: Physical Medicine and Rehabilitation

## 2019-12-06 ENCOUNTER — Ambulatory Visit: Payer: Self-pay

## 2019-12-06 VITALS — BP 187/90 | HR 89

## 2019-12-06 DIAGNOSIS — M5416 Radiculopathy, lumbar region: Secondary | ICD-10-CM | POA: Diagnosis not present

## 2019-12-06 DIAGNOSIS — M48062 Spinal stenosis, lumbar region with neurogenic claudication: Secondary | ICD-10-CM

## 2019-12-06 MED ORDER — METHYLPREDNISOLONE ACETATE 80 MG/ML IJ SUSP
80.0000 mg | Freq: Once | INTRAMUSCULAR | Status: AC
  Administered 2019-12-06: 80 mg

## 2019-12-06 NOTE — Progress Notes (Signed)
Pt state right lower back and hip pain. Pt states pain in her legs for the past two week. Pt state standing and walking makes the pain worse. Pt state laying down help with the pain.   Numeric Pain Rating Scale and Functional Assessment Average Pain 8   In the last MONTH (on 0-10 scale) has pain interfered with the following?  1. General activity like being  able to carry out your everyday physical activities such as walking, climbing stairs, carrying groceries, or moving a chair?  Rating(10)   +Driver, -BT, -Dye Allergies.

## 2019-12-08 ENCOUNTER — Other Ambulatory Visit: Payer: Self-pay | Admitting: *Deleted

## 2019-12-10 MED ORDER — LOSARTAN POTASSIUM 100 MG PO TABS: 100.0000 mg | ORAL_TABLET | Freq: Every day | ORAL | 3 refills | Status: DC

## 2019-12-12 ENCOUNTER — Ambulatory Visit: Payer: Medicare Other | Admitting: Family Medicine

## 2019-12-25 ENCOUNTER — Telehealth: Payer: Self-pay | Admitting: Physical Medicine and Rehabilitation

## 2019-12-25 DIAGNOSIS — E113213 Type 2 diabetes mellitus with mild nonproliferative diabetic retinopathy with macular edema, bilateral: Secondary | ICD-10-CM | POA: Diagnosis not present

## 2019-12-25 NOTE — Telephone Encounter (Signed)
Pt states she got an injection in her back and is now having pain in her R Leg and would like a CB from Dr.Newton to further discuss.  [phone]

## 2019-12-25 NOTE — Telephone Encounter (Signed)
Injection pics look good. First injection was L5-S1 interlam then followed by transforaminal. She has bad stenosis at L4-5 and mod at L3-4. She may want to follow with Dr. Joaquin Courts or we can or they can consider spine surgery referral. Leg pain from the stenosis is common.

## 2019-12-25 NOTE — Telephone Encounter (Signed)
Injection was 7/21. Please advise

## 2019-12-26 NOTE — Telephone Encounter (Signed)
Called patient to advise and scheduled follow up with Michelle Abbott.

## 2019-12-28 ENCOUNTER — Ambulatory Visit (INDEPENDENT_AMBULATORY_CARE_PROVIDER_SITE_OTHER): Payer: Medicare Other | Admitting: Family Medicine

## 2019-12-28 ENCOUNTER — Other Ambulatory Visit: Payer: Self-pay

## 2019-12-28 ENCOUNTER — Encounter: Payer: Self-pay | Admitting: Family Medicine

## 2019-12-28 VITALS — BP 140/70 | HR 75 | Wt 247.1 lb

## 2019-12-28 DIAGNOSIS — Z794 Long term (current) use of insulin: Secondary | ICD-10-CM

## 2019-12-28 DIAGNOSIS — E782 Mixed hyperlipidemia: Secondary | ICD-10-CM

## 2019-12-28 DIAGNOSIS — E1140 Type 2 diabetes mellitus with diabetic neuropathy, unspecified: Secondary | ICD-10-CM

## 2019-12-28 DIAGNOSIS — K625 Hemorrhage of anus and rectum: Secondary | ICD-10-CM

## 2019-12-28 DIAGNOSIS — N183 Chronic kidney disease, stage 3 unspecified: Secondary | ICD-10-CM

## 2019-12-28 DIAGNOSIS — I10 Essential (primary) hypertension: Secondary | ICD-10-CM

## 2019-12-28 DIAGNOSIS — Z1231 Encounter for screening mammogram for malignant neoplasm of breast: Secondary | ICD-10-CM

## 2019-12-28 DIAGNOSIS — M545 Low back pain: Secondary | ICD-10-CM

## 2019-12-28 DIAGNOSIS — Z1211 Encounter for screening for malignant neoplasm of colon: Secondary | ICD-10-CM | POA: Diagnosis not present

## 2019-12-28 DIAGNOSIS — G8929 Other chronic pain: Secondary | ICD-10-CM

## 2019-12-28 HISTORY — DX: Hemorrhage of anus and rectum: K62.5

## 2019-12-28 LAB — POCT GLYCOSYLATED HEMOGLOBIN (HGB A1C): HbA1c, POC (controlled diabetic range): 7.9 % — AB (ref 0.0–7.0)

## 2019-12-28 NOTE — Assessment & Plan Note (Signed)
Reminded patient to go for mammogram. She expresses understanding. Referral re-sent.

## 2019-12-28 NOTE — Assessment & Plan Note (Signed)
Patient thinks that her nephrologist increased coreg, but she is not sure by how much. She reports this makes her feel "weird," but cannot elaborate on explanation. Recommend patient call in with her exact medication change to best evaluate. She reports she will call when she gets home to let me know her medication and dosing. - At goal <150/90, continue to monitor -BMP

## 2019-12-28 NOTE — Assessment & Plan Note (Signed)
Obtained LDL today, ASA not indicated at age and discontinued.

## 2019-12-28 NOTE — Progress Notes (Signed)
SUBJECTIVE:   CHIEF COMPLAINT / HPI: DM f/u  DM: Hgb A1c is much improved today, from 9.1% to 7.9%. Patient reports that she has worked hard to be compliant and she is very pleased with this result, as am I. She reports no hypoglycemic episodes. She saw her ophthalmologist earlier this week, f/u in December. Patient will request records.   Low back and leg pain:  Patient has lumbar stenosis and recently had a steroid injection of her lumbar spine. She still reports pain in her leg. She describes pain in leg as sharp and radiating down her leg. She asks if she should restart gabapentin (been off for ~6 months). PMR physician who injected patient reports that this pain is common, recommends she follow up with orthopedics. She has an appointment on 8/23 with ortho PA. Discussed that back pain like hers will benefit the most from multi-modal therapy: injections, anti-inflammatory medications, conservative measures like ice/heat/stretching, gradually increasing exercise/PT home exercises, and weight loss. We discussed at length how beneficial weight loss and gentle exercise is for her. Patient reports that she does not want to change her diet significantly, she has been to nutritionist and reports that she has incorporated some of their recommendations into her lifestyle. On review of records, patient weighted 250lbs at last visit in 07/2019, currently 247 today. Patient currently does not track her calories. She reports that she is worried if she loses too much weight she will have hanging skin that will also hurt her back.  HTN: Patient at goal for age range <150/90. No HA's, vision changes.  PERTINENT  PMH / PSH: DM, HTN, gout,  OBJECTIVE:   BP 140/70    Pulse 75    Wt 247 lb 2 oz (112.1 kg)    SpO2 99%    BMI 43.78 kg/m   Physical Exam Vitals and nursing note reviewed.  Constitutional:      General: She is not in acute distress.    Appearance: Normal appearance. She is obese. She is not  ill-appearing, toxic-appearing or diaphoretic.  HENT:     Head: Normocephalic and atraumatic.  Cardiovascular:     Rate and Rhythm: Normal rate and regular rhythm.     Pulses: Normal pulses.     Heart sounds: Normal heart sounds.  Pulmonary:     Effort: Pulmonary effort is normal. No respiratory distress.     Breath sounds: Normal breath sounds. No wheezing, rhonchi or rales.  Musculoskeletal:     Comments: LEs: no erythema, edema, or warmth in either knee, full ROM present in knees. SLR (+) on right, (-) on left (contralateral).   Lumbar spine: no paraspinal muscle tenderness, no central column stepoffs or tenderness. Tenderness to palpation over R SI joint. No warmth, ecchymosis, erythema, or edema present.  Skin:    General: Skin is warm and dry.  Neurological:     General: No focal deficit present.     Mental Status: She is alert. Mental status is at baseline.  Psychiatric:        Mood and Affect: Mood normal.        Behavior: Behavior normal.    ASSESSMENT/PLAN:   Encounter for screening mammogram for malignant neoplasm of breast Reminded patient to go for mammogram. She expresses understanding. Referral re-sent.  Back pain Patient has spinal stenosis at L3-L4, L4-L5, recently had injections by PMR, Dr. Ernestina Patches. Patient reports still feeling pain down leg, common with this stenosis. Patient has f/u appointment with orthopedics on 8/23.  Exam is consistent with exam at last visit, no red flag symptoms of saddle anesthesia or incontinence. No concern for pathology intrinsic to knee based on history and physical exam. Patient is still deconditioned (walks to mailbox and back). Recommend patient continue to follow up with PMR and ortho, but counseled her on multi-modal approach to back pain, including weight loss and improved exercise tolerance. BMI is 44lbs.she also went to nutrition for one appointment and reports that she doesn't track what she eats, but has attempted to include some  recommendations into her daily diet. Patient went to PT x1 and opted for home exercises; she reports she does not do the exercises often because her back hurts. We discussed how moving less due to pain is a cycle that will continue.  Patient also reports that she is not sure about surgery, as she is afraid of the recovery and pain associated, a reasonable hesitation. We discussed that whether or not she has surgery, her best chance of recovering and reducing pain is to increase exercise and decrease pressure on her musculoskeletal system through weight loss. Recommended that she start tracking her nutrition. Patient is currently in the contemplative stage of losing weight, she is considering it, but not sure which action she would like to take yet.  Patient asks if she should restart gabapentin for radiculopathy. She has been off for ~6 months. She also has CKD; obtaining BMP today to assess creatinine, it is possible that it may help, and depending on cr we could restart that medication. Will discuss with patient after results return.  Type 2 diabetes mellitus with diabetic neuropathy, with long-term current use of insulin (HCC) Diabetes Current Regimen: Lantus 40U Last A1c: improved from 9.1% to 7.9%, excellent work Denies polyuria, polydipsia, hypoglycemia  Last Eye Exam: this week Statin: rosuvastatin 40mg  ACE/ARB: losartan 100mg     Hyperlipemia Obtained LDL today, ASA not indicated at age and discontinued.  CKD (chronic kidney disease), stage III [facility]) Patient has f/u next month. Checking BMP today.  Essential hypertension Patient thinks that her nephrologist increased coreg, but she is not sure by how much. She reports this makes her feel "weird," but cannot elaborate on explanation. Recommend patient call in with her exact medication change to best evaluate. She reports she will call when she gets home to let me know her medication and dosing. - At goal <150/90, continue to  monitor -BMP  Rectal bleeding Patient in ED in 10/2019 for rectal bleeding, labs normal, FOBT (+). Patient's last colonoscopy 2019. Patient recommended to follow up with GI for evaluation. She reports she has called, but has not heard back. Referral placed.     Gladys Damme, MD [place]

## 2019-12-28 NOTE — Assessment & Plan Note (Signed)
Patient has f/u next month. Checking BMP today.

## 2019-12-28 NOTE — Assessment & Plan Note (Signed)
Diabetes Current Regimen: Lantus 40U Last A1c: improved from 9.1% to 7.9%, excellent work Denies polyuria, polydipsia, hypoglycemia  Last Eye Exam: this week Statin: rosuvastatin 40mg  ACE/ARB: losartan 100mg 

## 2019-12-28 NOTE — Assessment & Plan Note (Signed)
Patient in ED in 10/2019 for rectal bleeding, labs normal, FOBT (+). Patient's last colonoscopy 2019. Patient recommended to follow up with GI for evaluation. She reports she has called, but has not heard back. Referral placed.

## 2019-12-28 NOTE — Assessment & Plan Note (Addendum)
Patient has spinal stenosis at L3-L4, L4-L5, recently had injections by PMR, Dr. Ernestina Patches. Patient reports still feeling pain down leg, common with this stenosis. Patient has f/u appointment with orthopedics on 8/23. Exam is consistent with exam at last visit, no red flag symptoms of saddle anesthesia or incontinence. No concern for pathology intrinsic to knee based on history and physical exam. Patient is still deconditioned (walks to mailbox and back). Recommend patient continue to follow up with PMR and ortho, but counseled her on multi-modal approach to back pain, including weight loss and improved exercise tolerance. BMI is 44lbs.she also went to nutrition for one appointment and reports that she doesn't track what she eats, but has attempted to include some recommendations into her daily diet. Patient went to PT x1 and opted for home exercises; she reports she does not do the exercises often because her back hurts. We discussed how moving less due to pain is a cycle that will continue.  Patient also reports that she is not sure about surgery, as she is afraid of the recovery and pain associated, a reasonable hesitation. We discussed that whether or not she has surgery, her best chance of recovering and reducing pain is to increase exercise and decrease pressure on her musculoskeletal system through weight loss. Recommended that she start tracking her nutrition. Patient is currently in the contemplative stage of losing weight, she is considering it, but not sure which action she would like to take yet.  Patient asks if she should restart gabapentin for radiculopathy. She has been off for ~6 months. She also has CKD; obtaining BMP today to assess creatinine, it is possible that it may help, and depending on cr we could restart that medication. Will discuss with patient after results return.

## 2019-12-28 NOTE — Patient Instructions (Signed)
It was a pleasure to see you today! Keep up the good work on your A1c.   1. We got blood work today, I will call you and let you know the results and if we need to make any changes.  2. For your back, the keep the follow up appointment with the orthopedist, but the most bang for your buck will be in losing weight. See resources below. I recommend you get an app and track your calories. Eating whole fruits, vegetables, and lean meats and keeping your calories <1,400 per day will result in weight loss. I also recommend you try a walking regimen 3x per week and build up to 30 minute walks.  3. I placed a referral to GI to follow up on your rectal bleeding from June. You should receive a call next week.  4. Follow up with a mammogram, this year will be your last screening.  5. Call me with your medication and exact dosage for Coreg.  Be Well!  Dr. Chauncey Reading   Budget-Friendly Healthy Eating There are many ways to save money at the grocery store and continue to eat healthy. You can be successful if you:  Plan meals according to your budget.  Make a grocery list and only purchase food according to your grocery list.  Prepare food yourself. What are tips for following this plan?  Reading food labels  Compare food labels between brand name foods and the store brand. Often the nutritional value is the same, but the store brand is lower cost.  Look for products that do not have added sugar, fat, or salt (sodium). These often cost the same but are healthier for you. Products may be labeled as: ? Sugar-free. ? Nonfat. ? Low-fat. ? Sodium-free. ? Low-sodium.  Look for lean ground beef labeled as at least 92% lean and 8% fat. Shopping  Buy only the items on your grocery list and go only to the areas of the store that have the items on your list.  Use coupons only for foods and brands you normally buy. Avoid buying items you wouldn't normally buy simply because they are on sale.  Check  online and in newspapers for weekly deals.  Buy healthy items from the bulk bins when available, such as herbs, spices, flour, pasta, nuts, and dried fruit.  Buy fruits and vegetables that are in season. Prices are usually lower on in-season produce.  Look at the unit price on the price tag. Use it to compare different brands and sizes to find out which item is the best deal.  Choose healthy items that are often low-cost, such as carrots, potatoes, apples, bananas, and oranges. Dried or canned beans are a low-cost protein source.  Buy in bulk and freeze extra food. Items you can buy in bulk include meats, fish, poultry, frozen fruits, and frozen vegetables.  Avoid buying "ready-to-eat" foods, such as pre-cut fruits and vegetables and pre-made salads.  If possible, shop around to discover where you can find the best prices. Consider other retailers such as dollar stores, larger [employer], local fruit and vegetable stands, and farmers markets.  Do not shop when you are hungry. If you shop while hungry, it may be hard to stick to your list and budget.  Resist impulse buying. Use your grocery list as your official plan for the week.  Buy a variety of vegetables and fruits by purchasing fresh, frozen, and canned items.  Look at the top and bottom shelves for deals. Foods at  eye level (eye level of an adult or child) are usually more expensive.  Be efficient with your time when shopping. The more time you spend at the store, the more money you are likely to spend.  To save money when choosing more expensive foods like meats and dairy: ? Choose cheaper cuts of meat, such as bone-in chicken thighs and drumsticks instead of skinless and boneless chicken. When you are ready to prepare the chicken, you can remove the skin yourself to make it healthier. ? Choose lean meats like chicken or [place] instead of beef. ? Choose canned seafood, such as tuna, salmon, or sardines. ? Buy eggs as a  low-cost source of protein. ? Buy dried beans and peas, such as lentils, split peas, or kidney beans instead of meats. Dried beans and peas are a good alternative source of protein. ? Buy the larger tubs of yogurt instead of individual-sized containers.  Choose water instead of sodas and other sweetened beverages.  Avoid buying chips, cookies, and other "junk food." These items are usually expensive and not healthy. Cooking  Make extra food and freeze the extras in meal-sized containers or in individual portions for fast meals and snacks.  Pre-cook on days when you have extra time to prepare meals in advance. You can keep these meals in the fridge or freezer and reheat for a quick meal.  When you come home from the grocery store, wash, peel, and cut fruits and vegetables so they are ready to use and eat. This will help reduce food waste. Meal planning  Do not eat out or get fast food. Prepare food at home.  Make a grocery list and make sure to bring it with you to the store. If you have a smart phone, you could use your phone to create your shopping list.  Plan meals and snacks according to a grocery list and budget you create.  Use leftovers in your meal plan for the week.  Look for recipes where you can cook once and make enough food for two meals.  Include budget-friendly meals like stews, casseroles, and stir-fry dishes.  Try some meatless meals or try "no cook" meals like salads.  Make sure that half your plate is filled with fruits or vegetables. Choose from fresh, frozen, or canned fruits and vegetables. If eating canned, remember to rinse them before eating. This will remove any excess salt added for packaging. Summary  Eating healthy on a budget is possible if you plan your meals according to your budget, purchase according to your budget and grocery list, and prepare food yourself.  Tips for buying more food on a limited budget include buying generic brands, using coupons  only for foods you normally buy, and buying healthy items from the bulk bins when available.  Tips for buying cheaper food to replace expensive food include choosing cheaper, lean cuts of meat, and buying dried beans and peas. This information is not intended to replace advice given to you by your health care provider. Make sure you discuss any questions you have with your health care provider. Document Revised: 05/05/2017 Document Reviewed: 05/05/2017 Elsevier Patient Education  [place].

## 2019-12-29 LAB — LIPID PANEL
Chol/HDL Ratio: 2.1 ratio (ref 0.0–4.4)
Cholesterol, Total: 115 mg/dL (ref 100–199)
HDL: 55 mg/dL (ref >39)
LDL Chol Calc (NIH): 42 mg/dL (ref 0–99)
Triglycerides: 97 mg/dL (ref 0–149)
VLDL Cholesterol Cal: 18 mg/dL (ref 5–40)

## 2019-12-29 LAB — BASIC METABOLIC PANEL
BUN/Creatinine Ratio: 14 (ref 12–28)
BUN: 29 mg/dL — ABNORMAL HIGH (ref 8–27)
CO2: 24 mmol/L (ref 20–29)
Calcium: 9.3 mg/dL (ref 8.7–10.3)
Chloride: 108 mmol/L — ABNORMAL HIGH (ref 96–106)
Creatinine, Ser: 2.05 mg/dL — ABNORMAL HIGH (ref 0.57–1.00)
GFR calc Af Amer: 27 mL/min/{1.73_m2} — ABNORMAL LOW (ref >59)
GFR calc non Af Amer: 24 mL/min/{1.73_m2} — ABNORMAL LOW (ref >59)
Glucose: 98 mg/dL (ref 65–99)
Potassium: 4.5 mmol/L (ref 3.5–5.2)
Sodium: 142 mmol/L (ref 134–144)

## 2020-01-01 NOTE — Procedures (Signed)
Lumbosacral Transforaminal Epidural Steroid Injection - Sub-Pedicular Approach with Fluoroscopic Guidance  Patient: Michelle Abbott      Date of Birth: [birth year] MRN: [id number] PCP: Gladys Damme, MD      Visit Date: 12/06/2019   Universal Protocol:    Date/Time: 12/06/2019  Consent Given By: the patient  Position: PRONE  Additional Comments: Vital signs were monitored before and after the procedure. Patient was prepped and draped in the usual sterile fashion. The correct patient, procedure, and site was verified.   Injection Procedure Details:  Procedure Site One Meds Administered:  Meds ordered this encounter  Medications  . methylPREDNISolone acetate (DEPO-MEDROL) injection 80 mg    Laterality: Right  Location/Site:  L4-L5  Needle size: 22 G  Needle type: Spinal  Needle Placement: Transforaminal  Findings:    -Comments: Excellent flow of contrast along the nerve, nerve root and into the epidural space.  Procedure Details: After squaring off the end-plates to get a true AP view, the C-arm was positioned so that an oblique view of the foramen as noted above was visualized. The target area is just inferior to the "nose of the scotty dog" or sub pedicular. The soft tissues overlying this structure were infiltrated with 2-3 ml. of 1% Lidocaine without Epinephrine.  The spinal needle was inserted toward the target using a "trajectory" view along the fluoroscope beam.  Under AP and lateral visualization, the needle was advanced so it did not puncture dura and was located close the 6 O'Clock position of the pedical in AP tracterory. Biplanar projections were used to confirm position. Aspiration was confirmed to be negative for CSF and/or blood. A 1-2 ml. volume of Isovue-250 was injected and flow of contrast was noted at each level. Radiographs were obtained for documentation purposes.   After attaining the desired flow of contrast documented above, a 0.5 to 1.0 ml test  dose of 0.25% Marcaine was injected into each respective transforaminal space.  The patient was observed for 90 seconds post injection.  After no sensory deficits were reported, and normal lower extremity motor function was noted,   the above injectate was administered so that equal amounts of the injectate were placed at each foramen (level) into the transforaminal epidural space.   Additional Comments:  The patient tolerated the procedure well Dressing: 2 x 2 sterile gauze and Band-Aid    Post-procedure details: Patient was observed during the procedure. Post-procedure instructions were reviewed.  Patient left the clinic in stable condition.

## 2020-01-01 NOTE — Progress Notes (Signed)
Lasandra Batley - 74 y.o. female MRN [id number]  Date of birth: [birth year]  Office Visit Note: Visit Date: 12/06/2019 PCP: Gladys Damme, MD Referred by: Gladys Damme, MD  Subjective: Chief Complaint  Patient presents with  . Lower Back - Pain  . Right Hip - Pain   HPI:  Justis Dupas is a 74 y.o. female who comes in today at the request of Dr. Jean Rosenthal for planned Right L4-L5 Lumbar epidural steroid injection with fluoroscopic guidance.  The patient has failed conservative care including home exercise, medications, time and activity modification.  This injection will be diagnostic and hopefully therapeutic.  Please see requesting physician notes for further details and justification.  Prior interlaminar epidural steroid injection did not help very much.  She does have severe multifactorial stenosis at L4-5.  History of chronic pain syndrome complicated by diabetes and obesity.  I am going to try transforaminal approach at the level of stenosis today.  Unfortunately she may be a surgical candidate.  ROS Otherwise per HPI.  Assessment & Plan: Visit Diagnoses:  1. Lumbar radiculopathy   2. Spinal stenosis of lumbar region with neurogenic claudication     Plan: No additional findings.   Meds & Orders:  Meds ordered this encounter  Medications  . methylPREDNISolone acetate (DEPO-MEDROL) injection 80 mg    Orders Placed This Encounter  Procedures  . XR C-ARM NO REPORT  . Epidural Steroid injection    Follow-up: Return if symptoms worsen or fail to improve.   Procedures: No procedures performed  Lumbosacral Transforaminal Epidural Steroid Injection - Sub-Pedicular Approach with Fluoroscopic Guidance  Patient: Tannis Burstein      Date of Birth: [birth year] MRN: [id number] PCP: Gladys Damme, MD      Visit Date: 12/06/2019   Universal Protocol:    Date/Time: 12/06/2019  Consent Given By: the patient  Position: PRONE  Additional Comments: Vital  signs were monitored before and after the procedure. Patient was prepped and draped in the usual sterile fashion. The correct patient, procedure, and site was verified.   Injection Procedure Details:  Procedure Site One Meds Administered:  Meds ordered this encounter  Medications  . methylPREDNISolone acetate (DEPO-MEDROL) injection 80 mg    Laterality: Right  Location/Site:  L4-L5  Needle size: 22 G  Needle type: Spinal  Needle Placement: Transforaminal  Findings:    -Comments: Excellent flow of contrast along the nerve, nerve root and into the epidural space.  Procedure Details: After squaring off the end-plates to get a true AP view, the C-arm was positioned so that an oblique view of the foramen as noted above was visualized. The target area is just inferior to the "nose of the scotty dog" or sub pedicular. The soft tissues overlying this structure were infiltrated with 2-3 ml. of 1% Lidocaine without Epinephrine.  The spinal needle was inserted toward the target using a "trajectory" view along the fluoroscope beam.  Under AP and lateral visualization, the needle was advanced so it did not puncture dura and was located close the 6 O'Clock position of the pedical in AP tracterory. Biplanar projections were used to confirm position. Aspiration was confirmed to be negative for CSF and/or blood. A 1-2 ml. volume of Isovue-250 was injected and flow of contrast was noted at each level. Radiographs were obtained for documentation purposes.   After attaining the desired flow of contrast documented above, a 0.5 to 1.0 ml test dose of 0.25% Marcaine was injected into each respective transforaminal space.  The patient  was observed for 90 seconds post injection.  After no sensory deficits were reported, and normal lower extremity motor function was noted,   the above injectate was administered so that equal amounts of the injectate were placed at each foramen (level) into the transforaminal  epidural space.   Additional Comments:  The patient tolerated the procedure well Dressing: 2 x 2 sterile gauze and Band-Aid    Post-procedure details: Patient was observed during the procedure. Post-procedure instructions were reviewed.  Patient left the clinic in stable condition.     Clinical History: CLINICAL DATA:  Low back pain radiating to the right lower extremity  EXAM: MRI LUMBAR SPINE WITHOUT CONTRAST  TECHNIQUE: Multiplanar, multisequence MR imaging of the lumbar spine was performed. No intravenous contrast was administered.  COMPARISON:  None.  FINDINGS: Segmentation:  Standard.  Alignment:  Grade 1 anterolisthesis at L4-5  Vertebrae: No fracture, evidence of discitis, or bone lesion. L3 hemangioma.  Conus medullaris and cauda equina: Conus extends to the L2 level. Conus and cauda equina appear normal.  Paraspinal and other soft tissues: 3.1 cm right and 2.5 cm left renal cysts.  Disc levels:  The T11-12 level is imaged only in the sagittal plane and shows no spinal canal or neural foraminal stenosis.  T12-L1: Normal  L1-L2: Normal disc space and facet joints. There is no spinal canal stenosis. No neural foraminal stenosis.  L2-L3: Right asymmetric disc bulge there is no spinal canal stenosis. Mild right neural foraminal stenosis.  L3-L4: Right asymmetric diffuse disc bulge. Moderate spinal canal stenosis. Severe right neural foraminal stenosis.  L4-L5: Severe facet hypertrophy with mild disc bulge. Severe spinal canal stenosis. Moderate right and severe left neural foraminal stenosis.  L5-S1: Moderate facet hypertrophy with intermediate disc bulge. There is no spinal canal stenosis. Mild bilateral neural foraminal stenosis.  Visualized sacrum: Normal.  IMPRESSION: 1. L4-L5 severe spinal canal stenosis with moderate right and severe left neural foraminal stenosis secondary to combination of anterolisthesis, disc  bulge and facet arthrosis. 2. L3-L4 moderate spinal canal stenosis and severe right neural foraminal stenosis. 3. L5-S1 mild bilateral neural foraminal stenosis.   Electronically Signed   By: Ulyses Jarred M.D.   On: 09/26/2019 00:21     Objective:  VS:  HT:    WT:   BMI:     BP:(!) 187/90  HR:89bpm  TEMP: ( )  RESP:  Physical Exam Constitutional:      General: She is not in acute distress.    Appearance: Normal appearance. She is obese. She is not ill-appearing.  HENT:     Head: Normocephalic and atraumatic.     Right Ear: External ear normal.     Left Ear: External ear normal.  Eyes:     Extraocular Movements: Extraocular movements intact.  Cardiovascular:     Rate and Rhythm: Normal rate.     Pulses: Normal pulses.  Musculoskeletal:     Right lower leg: No edema.     Left lower leg: No edema.     Comments: Patient has good distal strength with no pain over the greater trochanters.  No clonus or focal weakness.  Skin:    Findings: No erythema, lesion or rash.  Neurological:     General: No focal deficit present.     Mental Status: She is alert and oriented to person, place, and time.     Sensory: No sensory deficit.     Motor: No weakness or abnormal muscle tone.     Coordination:  Coordination normal.  Psychiatric:        Mood and Affect: Mood normal.        Behavior: Behavior normal.      Imaging: No results found.

## 2020-01-08 ENCOUNTER — Other Ambulatory Visit: Payer: Self-pay | Admitting: Radiology

## 2020-01-08 ENCOUNTER — Ambulatory Visit (INDEPENDENT_AMBULATORY_CARE_PROVIDER_SITE_OTHER): Payer: Medicare Other | Admitting: Physician Assistant

## 2020-01-08 ENCOUNTER — Encounter: Payer: Self-pay | Admitting: Physician Assistant

## 2020-01-08 DIAGNOSIS — M48062 Spinal stenosis, lumbar region with neurogenic claudication: Secondary | ICD-10-CM | POA: Diagnosis not present

## 2020-01-08 NOTE — Progress Notes (Signed)
HPI: [name] returns today due to low back pain with radicular symptoms down the right leg.  She underwent a right transforaminal injection by Dr. Ernestina Patches L4-5 on 12/06/2019.  She states after having the injection she had increased pain in the right leg.  States her low back pain however it is gotten somewhat better.  She also notes swelling in her right leg in the thigh that she is not having prior to this knee injection.  She has no pain down the left leg.  She describes the pain down the right leg is achy burning pain at times in the lateral thigh and dorsal foot.  Pain does awaken her.  Is taking tramadol for pain.  MRI did show multifactorial stenosis at L4-5 with severe spinal canal stenosis and moderate right and severe left foraminal stenosis at this level.  L3-4 moderate spinal canal stenosis and severe right neural foraminal stenosis.  L5-S1 mild bilateral foraminal stenosis.   Review of systems: Please see HPI otherwise negative.  Physical exam: General well-developed well-nourished female no acute distress mood affect appropriate.  Ambulates without any assistive device. Psych: Alert and oriented x3 Bilateral lower extremities negative straight leg raise bilaterally.  5 5 strength throughout lower extremities except for extension of the left great toe against resistance slight weakness.  Calves are supple and nontender bilaterally.  Impression: Lumbar stenosis with radicular symptoms right leg  Plan: Due to the fact the patient's failed conservative treatment which included exercises, medication, time and epidural steroid injections and continues to have pain.  Recommend referral to neurosurgery for further evaluation and treatment.  She will follow-up with [place] as needed.  Questions were encouraged and answered.

## 2020-01-15 DIAGNOSIS — N2581 Secondary hyperparathyroidism of renal origin: Secondary | ICD-10-CM | POA: Diagnosis not present

## 2020-01-15 DIAGNOSIS — I129 Hypertensive chronic kidney disease with stage 1 through stage 4 chronic kidney disease, or unspecified chronic kidney disease: Secondary | ICD-10-CM | POA: Diagnosis not present

## 2020-01-15 DIAGNOSIS — N184 Chronic kidney disease, stage 4 (severe): Secondary | ICD-10-CM | POA: Diagnosis not present

## 2020-01-15 DIAGNOSIS — N189 Chronic kidney disease, unspecified: Secondary | ICD-10-CM | POA: Diagnosis not present

## 2020-01-15 DIAGNOSIS — E1122 Type 2 diabetes mellitus with diabetic chronic kidney disease: Secondary | ICD-10-CM | POA: Diagnosis not present

## 2020-01-15 DIAGNOSIS — D631 Anemia in chronic kidney disease: Secondary | ICD-10-CM | POA: Diagnosis not present

## 2020-01-18 ENCOUNTER — Other Ambulatory Visit: Payer: Self-pay

## 2020-01-18 ENCOUNTER — Ambulatory Visit
Admission: RE | Admit: 2020-01-18 | Discharge: 2020-01-18 | Disposition: A | Discharge status: Home / Self Care | Payer: Medicare Other | Source: Ambulatory Visit | Attending: Family Medicine | Admitting: Family Medicine

## 2020-01-18 DIAGNOSIS — Z1231 Encounter for screening mammogram for malignant neoplasm of breast: Secondary | ICD-10-CM

## 2020-01-23 ENCOUNTER — Encounter: Payer: Self-pay | Admitting: Family Medicine

## 2020-02-08 ENCOUNTER — Other Ambulatory Visit: Payer: Self-pay | Admitting: Family Medicine

## 2020-02-08 DIAGNOSIS — I10 Essential (primary) hypertension: Secondary | ICD-10-CM

## 2020-03-19 DIAGNOSIS — M4807 Spinal stenosis, lumbosacral region: Secondary | ICD-10-CM | POA: Diagnosis not present

## 2020-03-19 DIAGNOSIS — M4316 Spondylolisthesis, lumbar region: Secondary | ICD-10-CM | POA: Diagnosis not present

## 2020-03-19 DIAGNOSIS — I10 Essential (primary) hypertension: Secondary | ICD-10-CM | POA: Diagnosis not present

## 2020-04-03 ENCOUNTER — Other Ambulatory Visit: Payer: Self-pay | Admitting: Family Medicine

## 2020-04-03 DIAGNOSIS — E1140 Type 2 diabetes mellitus with diabetic neuropathy, unspecified: Secondary | ICD-10-CM

## 2020-04-03 DIAGNOSIS — Z794 Long term (current) use of insulin: Secondary | ICD-10-CM

## 2020-04-04 ENCOUNTER — Other Ambulatory Visit: Payer: Self-pay | Admitting: Neurosurgery

## 2020-04-09 ENCOUNTER — Other Ambulatory Visit: Payer: Self-pay | Admitting: Family Medicine

## 2020-04-09 DIAGNOSIS — M1A39X0 Chronic gout due to renal impairment, multiple sites, without tophus (tophi): Secondary | ICD-10-CM

## 2020-04-22 DIAGNOSIS — M4316 Spondylolisthesis, lumbar region: Secondary | ICD-10-CM | POA: Diagnosis not present

## 2020-04-23 DIAGNOSIS — E113391 Type 2 diabetes mellitus with moderate nonproliferative diabetic retinopathy without macular edema, right eye: Secondary | ICD-10-CM | POA: Diagnosis not present

## 2020-04-23 DIAGNOSIS — H43822 Vitreomacular adhesion, left eye: Secondary | ICD-10-CM | POA: Diagnosis not present

## 2020-04-23 DIAGNOSIS — E113312 Type 2 diabetes mellitus with moderate nonproliferative diabetic retinopathy with macular edema, left eye: Secondary | ICD-10-CM | POA: Diagnosis not present

## 2020-04-23 DIAGNOSIS — H43813 Vitreous degeneration, bilateral: Secondary | ICD-10-CM | POA: Diagnosis not present

## 2020-04-23 MED ORDER — OXYCODONE HCL 5 MG PO TABS
ORAL_TABLET | ORAL | Status: AC
  Filled 2020-04-23: qty 1

## 2020-04-24 ENCOUNTER — Other Ambulatory Visit (HOSPITAL_COMMUNITY): Payer: Medicare Other

## 2020-04-24 ENCOUNTER — Encounter (HOSPITAL_COMMUNITY): Payer: Self-pay

## 2020-04-24 ENCOUNTER — Encounter (HOSPITAL_COMMUNITY)
Admission: RE | Admit: 2020-04-24 | Discharge: 2020-04-24 | Disposition: A | Discharge status: Home / Self Care | Payer: Medicare Other | Source: Ambulatory Visit | Attending: Neurosurgery | Admitting: Neurosurgery

## 2020-04-24 ENCOUNTER — Other Ambulatory Visit: Payer: Self-pay

## 2020-04-24 DIAGNOSIS — D631 Anemia in chronic kidney disease: Secondary | ICD-10-CM | POA: Diagnosis not present

## 2020-04-24 DIAGNOSIS — Z823 Family history of stroke: Secondary | ICD-10-CM | POA: Diagnosis not present

## 2020-04-24 DIAGNOSIS — Z888 Allergy status to other drugs, medicaments and biological substances status: Secondary | ICD-10-CM | POA: Diagnosis not present

## 2020-04-24 DIAGNOSIS — M48062 Spinal stenosis, lumbar region with neurogenic claudication: Secondary | ICD-10-CM | POA: Diagnosis not present

## 2020-04-24 DIAGNOSIS — I129 Hypertensive chronic kidney disease with stage 1 through stage 4 chronic kidney disease, or unspecified chronic kidney disease: Secondary | ICD-10-CM | POA: Diagnosis not present

## 2020-04-24 DIAGNOSIS — M5430 Sciatica, unspecified side: Secondary | ICD-10-CM | POA: Diagnosis not present

## 2020-04-24 DIAGNOSIS — E119 Type 2 diabetes mellitus without complications: Secondary | ICD-10-CM | POA: Insufficient documentation

## 2020-04-24 DIAGNOSIS — Z6841 Body Mass Index (BMI) 40.0 and over, adult: Secondary | ICD-10-CM | POA: Diagnosis not present

## 2020-04-24 DIAGNOSIS — Z794 Long term (current) use of insulin: Secondary | ICD-10-CM | POA: Diagnosis not present

## 2020-04-24 DIAGNOSIS — E1122 Type 2 diabetes mellitus with diabetic chronic kidney disease: Secondary | ICD-10-CM | POA: Diagnosis not present

## 2020-04-24 DIAGNOSIS — N184 Chronic kidney disease, stage 4 (severe): Secondary | ICD-10-CM | POA: Diagnosis not present

## 2020-04-24 DIAGNOSIS — E785 Hyperlipidemia, unspecified: Secondary | ICD-10-CM | POA: Diagnosis not present

## 2020-04-24 DIAGNOSIS — Z20822 Contact with and (suspected) exposure to covid-19: Secondary | ICD-10-CM | POA: Insufficient documentation

## 2020-04-24 DIAGNOSIS — Z825 Family history of asthma and other chronic lower respiratory diseases: Secondary | ICD-10-CM | POA: Diagnosis not present

## 2020-04-24 DIAGNOSIS — Z79899 Other long term (current) drug therapy: Secondary | ICD-10-CM | POA: Diagnosis not present

## 2020-04-24 DIAGNOSIS — M109 Gout, unspecified: Secondary | ICD-10-CM | POA: Diagnosis not present

## 2020-04-24 DIAGNOSIS — Z833 Family history of diabetes mellitus: Secondary | ICD-10-CM | POA: Diagnosis not present

## 2020-04-24 DIAGNOSIS — Z87891 Personal history of nicotine dependence: Secondary | ICD-10-CM | POA: Diagnosis not present

## 2020-04-24 DIAGNOSIS — M4316 Spondylolisthesis, lumbar region: Secondary | ICD-10-CM | POA: Diagnosis not present

## 2020-04-24 DIAGNOSIS — Z7982 Long term (current) use of aspirin: Secondary | ICD-10-CM | POA: Diagnosis not present

## 2020-04-24 DIAGNOSIS — E559 Vitamin D deficiency, unspecified: Secondary | ICD-10-CM | POA: Diagnosis not present

## 2020-04-24 DIAGNOSIS — Z801 Family history of malignant neoplasm of trachea, bronchus and lung: Secondary | ICD-10-CM | POA: Diagnosis not present

## 2020-04-24 DIAGNOSIS — Z8261 Family history of arthritis: Secondary | ICD-10-CM | POA: Diagnosis not present

## 2020-04-24 DIAGNOSIS — Z01818 Encounter for other preprocedural examination: Secondary | ICD-10-CM | POA: Insufficient documentation

## 2020-04-24 DIAGNOSIS — M5416 Radiculopathy, lumbar region: Secondary | ICD-10-CM | POA: Diagnosis not present

## 2020-04-24 LAB — BASIC METABOLIC PANEL
Anion gap: 11 (ref 5–15)
BUN: 46 mg/dL — ABNORMAL HIGH (ref 8–23)
CO2: 24 mmol/L (ref 22–32)
Calcium: 8.9 mg/dL (ref 8.9–10.3)
Chloride: 105 mmol/L (ref 98–111)
Creatinine, Ser: 2.58 mg/dL — ABNORMAL HIGH (ref 0.44–1.00)
GFR, Estimated: 19 mL/min — ABNORMAL LOW (ref >60)
Glucose, Bld: 136 mg/dL — ABNORMAL HIGH (ref 70–99)
Potassium: 3.4 mmol/L — ABNORMAL LOW (ref 3.5–5.1)
Sodium: 140 mmol/L (ref 135–145)

## 2020-04-24 LAB — CBC
HCT: 32.8 % — ABNORMAL LOW (ref 36.0–46.0)
Hemoglobin: 9.5 g/dL — ABNORMAL LOW (ref 12.0–15.0)
MCH: 26.2 pg (ref 26.0–34.0)
MCHC: 29 g/dL — ABNORMAL LOW (ref 30.0–36.0)
MCV: 90.4 fL (ref 80.0–100.0)
Platelets: 210 10*3/uL (ref 150–400)
RBC: 3.63 MIL/uL — ABNORMAL LOW (ref 3.87–5.11)
RDW: 15.2 % (ref 11.5–15.5)
WBC: 5.3 10*3/uL (ref 4.0–10.5)
nRBC: 0 % (ref 0.0–0.2)

## 2020-04-24 LAB — TYPE AND SCREEN
ABO/RH(D): O POS
Antibody Screen: NEGATIVE

## 2020-04-24 LAB — HEMOGLOBIN A1C
Hgb A1c MFr Bld: 7.2 % — ABNORMAL HIGH (ref 4.8–5.6)
Mean Plasma Glucose: 159.94 mg/dL

## 2020-04-24 LAB — SARS CORONAVIRUS 2 (TAT 6-24 HRS): SARS Coronavirus 2: NEGATIVE

## 2020-04-24 LAB — SURGICAL PCR SCREEN
MRSA, PCR: NEGATIVE
Staphylococcus aureus: NEGATIVE

## 2020-04-24 LAB — GLUCOSE, CAPILLARY: Glucose-Capillary: 151 mg/dL — ABNORMAL HIGH (ref 70–99)

## 2020-04-24 NOTE — Progress Notes (Addendum)
PCP - Gladys Damme Cardiologist - denies Nephrologist - [facility]  Chest x-ray - not needed EKG - 04/24/20 Stress Test - denies ECHO - denies Cardiac Cath - denies  Sleep Study - n/a   Type 2 Fasting Blood Sugar - 88-140s Checks Blood Sugar __1___ times a day  Aspirin Instructions: LD 04/19/20  ERAS Protcol - no PRE-SURGERY Ensure or G2- not given  COVID TEST- Pending result, tested at PAT due to transportation issues, patients sample walked down to main lab at 10:37 am.  Patient was given instructions to quarantine from now until surgery with the exception of wearing a mask to doctors appts  Anesthesia review: yes   Patient denies shortness of breath, fever, cough and chest pain at PAT appointment   All instructions explained to the patient, with a verbal understanding of the material. Patient agrees to go over the instructions while at home for a better understanding. Patient also instructed to self quarantine after being tested for COVID-19. The opportunity to ask questions was provided.

## 2020-04-24 NOTE — Progress Notes (Signed)
[id number] [address], [address] [place] [address] Phone: [phone] Fax: [place], [address], [address] [place], [address] [place] [address] Phone: [phone] Fax: [phone]      Your procedure is scheduled on December 10  Report to [facility] Main Entrance "A" at 0830 A.M., and check in at the Admitting office.  Call this number if you have problems the morning of surgery:  [phone]  Call [phone] if you have any questions prior to your surgery date Monday-Friday 8am-4pm    Remember:  Do not eat or drink after midnight the night before your surgery   Take these medicines the morning of surgery with A SIP OF WATER  carvedilol (COREG) colchicine allopurinol (ZYLOPRIM) chlorthalidone (HYGROTON) rosuvastatin (CRESTOR) traMADol (ULTRAM)  Follow your surgeon's instructions on when to stop Aspirin.  If no instructions were given by your surgeon then you will need to call the office to get those instructions.    As of today, STOP taking any Aspirin (unless otherwise instructed by your surgeon) Aleve, Naproxen, Ibuprofen, Motrin, Advil, Goody's, BC's, all herbal medications, fish oil, and all vitamins.    WHAT DO I DO ABOUT MY DIABETES MEDICATION?   Marland Kitchen Do not take oral diabetes medicines (pills) the morning of surgery.  . Take OZEMPIC on your normal day (Wednesday) before surgery as normal      . THE MORNING OF SURGERY, take ______20_______ units of _LANTUS_________insulin.    HOW TO MANAGE YOUR DIABETES BEFORE AND AFTER SURGERY  Why is it important to control my blood sugar before and after surgery? . Improving blood sugar levels before and after surgery helps healing and can limit problems. . A way of improving blood sugar control is eating a healthy diet by: o  Eating less sugar and carbohydrates o  Increasing  activity/exercise o  Talking with your doctor about reaching your blood sugar goals . High blood sugars (greater than 180 mg/dL) can raise your risk of infections and slow your recovery, so you will need to focus on controlling your diabetes during the weeks before surgery. . Make sure that the doctor who takes care of your diabetes knows about your planned surgery including the date and location.  How do I manage my blood sugar before surgery? . Check your blood sugar at least 4 times a day, starting 2 days before surgery, to make sure that the level is not too high or low. . Check your blood sugar the morning of your surgery when you wake up and every 2 hours until you get to the Short Stay unit. o If your blood sugar is less than 70 mg/dL, you will need to treat for low blood sugar: - Do not take insulin. - Treat a low blood sugar (less than 70 mg/dL) with  cup of clear juice (cranberry or apple), 4 glucose tablets, OR glucose gel. - Recheck blood sugar in 15 minutes after treatment (to make sure it is greater than 70 mg/dL). If your blood sugar is not greater than 70 mg/dL on recheck, call [phone] for further instructions. . Report your blood sugar to the short stay nurse when you get to Short Stay.  . If you are admitted to the hospital after surgery: o Your blood sugar will be checked by the staff and you will probably be given insulin after surgery (instead of oral diabetes medicines) to make  sure you have good blood sugar levels. o The goal for blood sugar control after surgery is 80-180 mg/dL.                     Do not wear jewelry, make up, or nail polish            Do not wear lotions, powders, perfumes/colognes, or deodorant.            Do not shave 48 hours prior to surgery.  Men may shave face and neck.            Do not bring valuables to the hospital.            [facility] is not responsible for any belongings or valuables.  Do NOT Smoke (Tobacco/Vaping) or drink  Alcohol 24 hours prior to your procedure If you use a CPAP at night, you may bring all equipment for your overnight stay.   Contacts, glasses, dentures or bridgework may not be worn into surgery.      For patients admitted to the hospital, discharge time will be determined by your treatment team.   Patients discharged the day of surgery will not be allowed to drive home, and someone needs to stay with them for 24 hours.    Special instructions:   [place]- Preparing For Surgery  Before surgery, you can play an important role. Because skin is not sterile, your skin needs to be as free of germs as possible. You can reduce the number of germs on your skin by washing with CHG (chlorahexidine gluconate) Soap before surgery.  CHG is an antiseptic cleaner which kills germs and bonds with the skin to continue killing germs even after washing.    Oral Hygiene is also important to reduce your risk of infection.  Remember - BRUSH YOUR TEETH THE MORNING OF SURGERY WITH YOUR REGULAR TOOTHPASTE  Please do not use if you have an allergy to CHG or antibacterial soaps. If your skin becomes reddened/irritated stop using the CHG.  Do not shave (including legs and underarms) for at least 48 hours prior to first CHG shower. It is OK to shave your face.  Please follow these instructions carefully.   1. Shower the NIGHT BEFORE SURGERY and the MORNING OF SURGERY with CHG Soap.   2. If you chose to wash your hair, wash your hair first as usual with your normal shampoo.  3. After you shampoo, rinse your hair and body thoroughly to remove the shampoo.  4. Use CHG as you would any other liquid soap. You can apply CHG directly to the skin and wash gently with a scrungie or a clean washcloth.   5. Apply the CHG Soap to your body ONLY FROM THE NECK DOWN.  Do not use on open wounds or open sores. Avoid contact with your eyes, ears, mouth and genitals (private parts). Wash Face and genitals (private parts)  with  your normal soap.   6. Wash thoroughly, paying special attention to the area where your surgery will be performed.  7. Thoroughly rinse your body with warm water from the neck down.  8. DO NOT shower/wash with your normal soap after using and rinsing off the CHG Soap.  9. Pat yourself dry with a CLEAN TOWEL.  10. Wear CLEAN PAJAMAS to bed the night before surgery  11. Place CLEAN SHEETS on your bed the night of your first shower and DO NOT SLEEP WITH PETS.   Day of  Surgery: Wear Clean/Comfortable clothing the morning of surgery Do not apply any deodorants/lotions.   Remember to brush your teeth WITH YOUR REGULAR TOOTHPASTE.   Please read over the following fact sheets that you were given.

## 2020-04-25 NOTE — Progress Notes (Signed)
Anesthesia Chart Review:  History of CKD 4 followed by Dr. Posey Pronto at [facility].  Last seen 01/15/2020, felt to be stable at that time, creatinine 2.05, advised to follow-up in 4 months.  IDDM2 reasonably well controlled, A1c 7.2 on preop labs.  Preop labs reviewed, creatinine 2.58 is above her baseline but consistent with her diagnosis of CKD 4.  She has stable anemia, preop labs show hemoglobin 9.5 which is near baseline per review of records.  Remainder of labs unremarkable.  TTE 03/01/14: 1. Left ventricle: The cavity size was normal. Wall thickness was mildly increased in a pattern of concentric left ventricular hypertrophy: Images were inadequate for LV wall motion assessment.  2. Right ventricle: Poorly visualized but grossly normal. 3. Pulmonary arteries: Systolic pressure could not be accurately estimated.  Impressions: Date of prior study: 02/16/2007 Probably no significant change.    Wynonia Musty [facility] Short Stay Center/Anesthesiology Phone [phone] 04/25/2020 10:55 AM

## 2020-04-25 NOTE — Anesthesia Preprocedure Evaluation (Addendum)
Anesthesia Evaluation  Patient identified by MRN, date of birth, ID band Patient awake    Reviewed: Allergy & Precautions, NPO status , Patient's Chart, lab work & pertinent test results, reviewed documented beta blocker date and time   History of Anesthesia Complications Negative for: history of anesthetic complications  Airway Mallampati: II  TM Distance: >3 FB Neck ROM: Full    Dental  (+) Edentulous Upper, Edentulous Lower   Pulmonary neg pulmonary ROS, former smoker,    Pulmonary exam normal        Cardiovascular hypertension, Pt. on medications and Pt. on home beta blockers negative cardio ROS Normal cardiovascular exam     Neuro/Psych negative neurological ROS  negative psych ROS   GI/Hepatic negative GI ROS, Neg liver ROS,   Endo/Other  diabetes (A1c 7.2), Type 2, Insulin DependentMorbid obesity  Renal/GU Renal disease (CKD4, Cr 2.58)  negative genitourinary   Musculoskeletal negative musculoskeletal ROS (+)   Abdominal   Peds  Hematology  (+) anemia , Hgb 9.5   Anesthesia Other Findings  TTE 03/01/14: 1. Left ventricle: The cavity size was normal. Wall thickness was mildly increased in a pattern of concentric left ventricular hypertrophy: Images were inadequate for LV wall motion assessment.  2. Right ventricle: Poorly visualized but grossly normal. 3. Pulmonary arteries: Systolic pressure could not be accurately estimated.  Impressions: Date of prior study: 02/16/2007 Probably no significant change.   Reproductive/Obstetrics                           Anesthesia Physical Anesthesia Plan  ASA: III  Anesthesia Plan: General   Post-op Pain Management:    Induction: Intravenous  PONV Risk Score and Plan: 3 and Ondansetron, Dexamethasone, Treatment may vary due to age or medical condition and Midazolam  Airway Management Planned: Oral ETT  Additional Equipment:  None  Intra-op Plan:   Post-operative Plan: Extubation in OR  Informed Consent: I have reviewed the patients History and Physical, chart, labs and discussed the procedure including the risks, benefits and alternatives for the proposed anesthesia with the patient or authorized representative who has indicated his/her understanding and acceptance.     Dental advisory given  Plan Discussed with:   Anesthesia Plan Comments: (PAT note by Karoline Caldwell, PA-C: History of CKD 4 followed by Dr. Posey Pronto at [facility].  Last seen 01/15/2020, felt to be stable at that time, creatinine 2.05, advised to follow-up in 4 months.  IDDM2 reasonably well controlled, A1c 7.2 on preop labs.  Preop labs reviewed, creatinine 2.58 is above her baseline but consistent with her diagnosis of CKD 4.  She has stable anemia, preop labs show hemoglobin 9.5 which is near baseline per review of records.  Remainder of labs unremarkable.  TTE 03/01/14: 1. Left ventricle: The cavity size was normal. Wall thickness was mildly increased in a pattern of concentric left ventricular hypertrophy: Images were inadequate for LV wall motion assessment.  2. Right ventricle: Poorly visualized but grossly normal. 3. Pulmonary arteries: Systolic pressure could not be accurately estimated.  Impressions: Date of prior study: 02/16/2007 Probably no significant change.   )       Anesthesia Quick Evaluation

## 2020-04-26 ENCOUNTER — Inpatient Hospital Stay (HOSPITAL_COMMUNITY): Payer: Medicare Other

## 2020-04-26 ENCOUNTER — Inpatient Hospital Stay (HOSPITAL_COMMUNITY): Payer: Medicare Other | Admitting: Physician Assistant

## 2020-04-26 ENCOUNTER — Encounter (HOSPITAL_COMMUNITY)
Admission: RE | Disposition: A | Discharge status: Home / Self Care | Payer: Self-pay | Source: Home / Self Care | Attending: Neurosurgery

## 2020-04-26 ENCOUNTER — Encounter (HOSPITAL_COMMUNITY): Payer: Self-pay | Admitting: Neurosurgery

## 2020-04-26 ENCOUNTER — Inpatient Hospital Stay (HOSPITAL_COMMUNITY): Payer: Medicare Other | Admitting: Certified Registered Nurse Anesthetist

## 2020-04-26 ENCOUNTER — Other Ambulatory Visit: Payer: Self-pay

## 2020-04-26 ENCOUNTER — Inpatient Hospital Stay (HOSPITAL_COMMUNITY)
Admission: RE | Admit: 2020-04-26 | Discharge: 2020-04-27 | DRG: 460 | Disposition: A | Discharge status: Home / Self Care | Payer: Medicare Other | Attending: Neurosurgery | Admitting: Neurosurgery

## 2020-04-26 DIAGNOSIS — M4316 Spondylolisthesis, lumbar region: Secondary | ICD-10-CM | POA: Diagnosis not present

## 2020-04-26 DIAGNOSIS — Z794 Long term (current) use of insulin: Secondary | ICD-10-CM | POA: Diagnosis not present

## 2020-04-26 DIAGNOSIS — D631 Anemia in chronic kidney disease: Secondary | ICD-10-CM | POA: Diagnosis present

## 2020-04-26 DIAGNOSIS — Z888 Allergy status to other drugs, medicaments and biological substances status: Secondary | ICD-10-CM | POA: Diagnosis not present

## 2020-04-26 DIAGNOSIS — Z801 Family history of malignant neoplasm of trachea, bronchus and lung: Secondary | ICD-10-CM

## 2020-04-26 DIAGNOSIS — Z823 Family history of stroke: Secondary | ICD-10-CM | POA: Diagnosis not present

## 2020-04-26 DIAGNOSIS — Z8261 Family history of arthritis: Secondary | ICD-10-CM | POA: Diagnosis not present

## 2020-04-26 DIAGNOSIS — Z825 Family history of asthma and other chronic lower respiratory diseases: Secondary | ICD-10-CM | POA: Diagnosis not present

## 2020-04-26 DIAGNOSIS — E1122 Type 2 diabetes mellitus with diabetic chronic kidney disease: Secondary | ICD-10-CM | POA: Diagnosis present

## 2020-04-26 DIAGNOSIS — Z7982 Long term (current) use of aspirin: Secondary | ICD-10-CM

## 2020-04-26 DIAGNOSIS — N184 Chronic kidney disease, stage 4 (severe): Secondary | ICD-10-CM | POA: Diagnosis not present

## 2020-04-26 DIAGNOSIS — Z833 Family history of diabetes mellitus: Secondary | ICD-10-CM | POA: Diagnosis not present

## 2020-04-26 DIAGNOSIS — M5430 Sciatica, unspecified side: Secondary | ICD-10-CM | POA: Diagnosis present

## 2020-04-26 DIAGNOSIS — M48062 Spinal stenosis, lumbar region with neurogenic claudication: Principal | ICD-10-CM | POA: Diagnosis present

## 2020-04-26 DIAGNOSIS — M48061 Spinal stenosis, lumbar region without neurogenic claudication: Secondary | ICD-10-CM | POA: Diagnosis not present

## 2020-04-26 DIAGNOSIS — E559 Vitamin D deficiency, unspecified: Secondary | ICD-10-CM | POA: Diagnosis not present

## 2020-04-26 DIAGNOSIS — Z8041 Family history of malignant neoplasm of ovary: Secondary | ICD-10-CM | POA: Diagnosis not present

## 2020-04-26 DIAGNOSIS — M4726 Other spondylosis with radiculopathy, lumbar region: Secondary | ICD-10-CM | POA: Diagnosis not present

## 2020-04-26 DIAGNOSIS — Z0389 Encounter for observation for other suspected diseases and conditions ruled out: Secondary | ICD-10-CM | POA: Diagnosis not present

## 2020-04-26 DIAGNOSIS — E785 Hyperlipidemia, unspecified: Secondary | ICD-10-CM | POA: Diagnosis present

## 2020-04-26 DIAGNOSIS — Z419 Encounter for procedure for purposes other than remedying health state, unspecified: Secondary | ICD-10-CM

## 2020-04-26 DIAGNOSIS — Z87891 Personal history of nicotine dependence: Secondary | ICD-10-CM | POA: Diagnosis not present

## 2020-04-26 DIAGNOSIS — I129 Hypertensive chronic kidney disease with stage 1 through stage 4 chronic kidney disease, or unspecified chronic kidney disease: Secondary | ICD-10-CM | POA: Diagnosis present

## 2020-04-26 DIAGNOSIS — Z20822 Contact with and (suspected) exposure to covid-19: Secondary | ICD-10-CM | POA: Diagnosis present

## 2020-04-26 DIAGNOSIS — M109 Gout, unspecified: Secondary | ICD-10-CM | POA: Diagnosis present

## 2020-04-26 DIAGNOSIS — Z6841 Body Mass Index (BMI) 40.0 and over, adult: Secondary | ICD-10-CM | POA: Diagnosis not present

## 2020-04-26 DIAGNOSIS — Z79899 Other long term (current) drug therapy: Secondary | ICD-10-CM | POA: Diagnosis not present

## 2020-04-26 DIAGNOSIS — M5416 Radiculopathy, lumbar region: Secondary | ICD-10-CM | POA: Diagnosis present

## 2020-04-26 LAB — GLUCOSE, CAPILLARY
Glucose-Capillary: 154 mg/dL — ABNORMAL HIGH (ref 70–99)
Glucose-Capillary: 139 mg/dL — ABNORMAL HIGH (ref 70–99)
Glucose-Capillary: 173 mg/dL — ABNORMAL HIGH (ref 70–99)
Glucose-Capillary: 214 mg/dL — ABNORMAL HIGH (ref 70–99)

## 2020-04-26 SURGERY — POSTERIOR LUMBAR FUSION 2 LEVEL
Anesthesia: General | Site: Spine Lumbar

## 2020-04-26 MED ORDER — MENTHOL 3 MG MT LOZG: 1.0000 | LOZENGE | OROMUCOSAL | Status: DC | PRN

## 2020-04-26 MED ORDER — SODIUM CHLORIDE 0.9% FLUSH: 3.0000 mL | INTRAVENOUS | Status: DC | PRN

## 2020-04-26 MED ORDER — MIDAZOLAM HCL 2 MG/2ML IJ SOLN
INTRAMUSCULAR | Status: DC | PRN
  Administered 2020-04-26: 2 mg via INTRAVENOUS

## 2020-04-26 MED ORDER — ARTIFICIAL TEARS OPHTHALMIC OINT
TOPICAL_OINTMENT | OPHTHALMIC | Status: AC
  Filled 2020-04-26: qty 7

## 2020-04-26 MED ORDER — SODIUM CHLORIDE 0.9 % IV SOLN: INTRAVENOUS | Status: DC

## 2020-04-26 MED ORDER — ACETAMINOPHEN 650 MG RE SUPP: 650.0000 mg | RECTAL | Status: DC | PRN

## 2020-04-26 MED ORDER — COLCHICINE 0.6 MG PO TABS
1.2000 mg | ORAL_TABLET | ORAL | Status: DC | PRN
  Filled 2020-04-26: qty 2

## 2020-04-26 MED ORDER — INSULIN GLARGINE 100 UNIT/ML ~~LOC~~ SOLN
40.0000 [IU] | Freq: Every day | SUBCUTANEOUS | Status: DC
  Administered 2020-04-27: 10:00:00 40 [IU] via SUBCUTANEOUS
  Filled 2020-04-26: qty 0.4

## 2020-04-26 MED ORDER — CEFAZOLIN SODIUM-DEXTROSE 2-3 GM-%(50ML) IV SOLR
INTRAVENOUS | Status: DC | PRN
  Administered 2020-04-26: 2 g via INTRAVENOUS

## 2020-04-26 MED ORDER — LIDOCAINE-EPINEPHRINE 1 %-1:100000 IJ SOLN
INTRAMUSCULAR | Status: DC | PRN
  Administered 2020-04-26: 10 mL

## 2020-04-26 MED ORDER — DEXAMETHASONE SODIUM PHOSPHATE 10 MG/ML IJ SOLN: 10.0000 mg | Freq: Once | INTRAMUSCULAR | Status: DC

## 2020-04-26 MED ORDER — SODIUM CHLORIDE 0.9 % IV SOLN: 250.0000 mL | INTRAVENOUS | Status: DC

## 2020-04-26 MED ORDER — 0.9 % SODIUM CHLORIDE (POUR BTL) OPTIME
TOPICAL | Status: DC | PRN
  Administered 2020-04-26: 1000 mL

## 2020-04-26 MED ORDER — COLCHICINE 0.6 MG PO TABS: 0.6000 mg | ORAL_TABLET | ORAL | Status: DC

## 2020-04-26 MED ORDER — ONDANSETRON HCL 4 MG/2ML IJ SOLN: 4.0000 mg | Freq: Four times a day (QID) | INTRAMUSCULAR | Status: DC | PRN

## 2020-04-26 MED ORDER — AMISULPRIDE (ANTIEMETIC) 5 MG/2ML IV SOLN: 10.0000 mg | Freq: Once | INTRAVENOUS | Status: DC | PRN

## 2020-04-26 MED ORDER — INSULIN ASPART 100 UNIT/ML ~~LOC~~ SOLN
0.0000 [IU] | Freq: Three times a day (TID) | SUBCUTANEOUS | Status: DC
  Administered 2020-04-27: 08:00:00 4 [IU] via SUBCUTANEOUS

## 2020-04-26 MED ORDER — CARVEDILOL 25 MG PO TABS
25.0000 mg | ORAL_TABLET | Freq: Two times a day (BID) | ORAL | Status: DC
  Administered 2020-04-26 – 2020-04-27 (×2): 25 mg via ORAL
  Filled 2020-04-26 (×2): qty 1

## 2020-04-26 MED ORDER — ROSUVASTATIN CALCIUM 20 MG PO TABS
40.0000 mg | ORAL_TABLET | Freq: Every day | ORAL | Status: DC
  Administered 2020-04-27: 11:00:00 40 mg via ORAL
  Filled 2020-04-26: qty 2

## 2020-04-26 MED ORDER — OXYCODONE HCL 5 MG PO TABS: 5.0000 mg | ORAL_TABLET | Freq: Once | ORAL | Status: DC | PRN

## 2020-04-26 MED ORDER — CEFAZOLIN SODIUM-DEXTROSE 2-4 GM/100ML-% IV SOLN: 2.0000 g | INTRAVENOUS | Status: DC

## 2020-04-26 MED ORDER — OXYCODONE HCL 5 MG PO TABS
10.0000 mg | ORAL_TABLET | ORAL | Status: DC | PRN
Start: 2020-04-26 — End: 2020-04-27
  Administered 2020-04-26 – 2020-04-27 (×5): 10 mg via ORAL
  Filled 2020-04-26 (×4): qty 2

## 2020-04-26 MED ORDER — LACTATED RINGERS IV SOLN: INTRAVENOUS | Status: DC

## 2020-04-26 MED ORDER — THROMBIN 20000 UNITS EX SOLR
CUTANEOUS | Status: AC
  Filled 2020-04-26: qty 20000

## 2020-04-26 MED ORDER — ADULT MULTIVITAMIN W/MINERALS CH
1.0000 | ORAL_TABLET | Freq: Every day | ORAL | Status: DC
  Administered 2020-04-27: 11:00:00 1 via ORAL
  Filled 2020-04-26: qty 1

## 2020-04-26 MED ORDER — ONDANSETRON HCL 4 MG/2ML IJ SOLN
INTRAMUSCULAR | Status: DC | PRN
  Administered 2020-04-26: 4 mg via INTRAVENOUS

## 2020-04-26 MED ORDER — INSULIN ASPART 100 UNIT/ML ~~LOC~~ SOLN: 0.0000 [IU] | Freq: Three times a day (TID) | SUBCUTANEOUS | Status: DC

## 2020-04-26 MED ORDER — PHENOL 1.4 % MT LIQD: 1.0000 | OROMUCOSAL | Status: DC | PRN

## 2020-04-26 MED ORDER — HYDROXYZINE HCL 50 MG/ML IM SOLN
50.0000 mg | Freq: Four times a day (QID) | INTRAMUSCULAR | Status: DC | PRN
  Administered 2020-04-26: 50 mg via INTRAMUSCULAR
  Filled 2020-04-26: qty 1

## 2020-04-26 MED ORDER — FENTANYL CITRATE (PF) 250 MCG/5ML IJ SOLN
INTRAMUSCULAR | Status: AC
  Filled 2020-04-26: qty 5

## 2020-04-26 MED ORDER — ORAL CARE MOUTH RINSE: 15.0000 mL | Freq: Once | OROMUCOSAL | Status: AC

## 2020-04-26 MED ORDER — TRAMADOL HCL 50 MG PO TABS
50.0000 mg | ORAL_TABLET | Freq: Four times a day (QID) | ORAL | Status: DC | PRN
  Administered 2020-04-26: 50 mg via ORAL
  Filled 2020-04-26: qty 1

## 2020-04-26 MED ORDER — CEFAZOLIN SODIUM-DEXTROSE 2-4 GM/100ML-% IV SOLN: 2.0000 g | Freq: Three times a day (TID) | INTRAVENOUS | Status: DC

## 2020-04-26 MED ORDER — PHENYLEPHRINE 40 MCG/ML (10ML) SYRINGE FOR IV PUSH (FOR BLOOD PRESSURE SUPPORT)
PREFILLED_SYRINGE | INTRAVENOUS | Status: AC
  Filled 2020-04-26: qty 10

## 2020-04-26 MED ORDER — SODIUM CHLORIDE 0.9% FLUSH
3.0000 mL | Freq: Two times a day (BID) | INTRAVENOUS | Status: DC
  Administered 2020-04-26: 3 mL via INTRAVENOUS

## 2020-04-26 MED ORDER — VITAMIN B-12 1000 MCG PO TABS
1000.0000 ug | ORAL_TABLET | Freq: Every day | ORAL | Status: DC
  Administered 2020-04-27: 11:00:00 1000 ug via ORAL
  Filled 2020-04-26: qty 1

## 2020-04-26 MED ORDER — DEXAMETHASONE SODIUM PHOSPHATE 10 MG/ML IJ SOLN
INTRAMUSCULAR | Status: AC
  Filled 2020-04-26: qty 1

## 2020-04-26 MED ORDER — INSULIN GLARGINE 100 UNIT/ML SOLOSTAR PEN: 40.0000 [IU] | PEN_INJECTOR | SUBCUTANEOUS | Status: DC

## 2020-04-26 MED ORDER — PHENYLEPHRINE 40 MCG/ML (10ML) SYRINGE FOR IV PUSH (FOR BLOOD PRESSURE SUPPORT)
PREFILLED_SYRINGE | INTRAVENOUS | Status: DC | PRN
  Administered 2020-04-26: 120 ug via INTRAVENOUS
  Administered 2020-04-26 (×2): 80 ug via INTRAVENOUS

## 2020-04-26 MED ORDER — CHLORHEXIDINE GLUCONATE CLOTH 2 % EX PADS: 6.0000 | MEDICATED_PAD | Freq: Once | CUTANEOUS | Status: DC

## 2020-04-26 MED ORDER — EPHEDRINE SULFATE 50 MG/ML IJ SOLN
INTRAMUSCULAR | Status: DC | PRN
  Administered 2020-04-26 (×2): 10 mg via INTRAVENOUS

## 2020-04-26 MED ORDER — EPHEDRINE 5 MG/ML INJ
INTRAVENOUS | Status: AC
  Filled 2020-04-26: qty 20

## 2020-04-26 MED ORDER — LIDOCAINE 2% (20 MG/ML) 5 ML SYRINGE
INTRAMUSCULAR | Status: DC | PRN
  Administered 2020-04-26: 100 mg via INTRAVENOUS

## 2020-04-26 MED ORDER — ALUM & MAG HYDROXIDE-SIMETH 200-200-20 MG/5ML PO SUSP: 30.0000 mL | Freq: Four times a day (QID) | ORAL | Status: DC | PRN

## 2020-04-26 MED ORDER — FENTANYL CITRATE (PF) 100 MCG/2ML IJ SOLN
INTRAMUSCULAR | Status: AC
  Filled 2020-04-26: qty 2

## 2020-04-26 MED ORDER — ROCURONIUM BROMIDE 10 MG/ML (PF) SYRINGE
PREFILLED_SYRINGE | INTRAVENOUS | Status: AC
  Filled 2020-04-26: qty 30

## 2020-04-26 MED ORDER — MIDAZOLAM HCL 2 MG/2ML IJ SOLN
INTRAMUSCULAR | Status: AC
  Filled 2020-04-26: qty 2

## 2020-04-26 MED ORDER — PHENYLEPHRINE HCL-NACL 10-0.9 MG/250ML-% IV SOLN
INTRAVENOUS | Status: DC | PRN
  Administered 2020-04-26: 30 ug/min via INTRAVENOUS

## 2020-04-26 MED ORDER — LIDOCAINE HCL (PF) 2 % IJ SOLN
INTRAMUSCULAR | Status: AC
  Filled 2020-04-26: qty 10

## 2020-04-26 MED ORDER — CEFAZOLIN SODIUM-DEXTROSE 2-4 GM/100ML-% IV SOLN
2.0000 g | Freq: Two times a day (BID) | INTRAVENOUS | Status: DC
  Administered 2020-04-26: 2 g via INTRAVENOUS
  Filled 2020-04-26: qty 100

## 2020-04-26 MED ORDER — PROPOFOL 10 MG/ML IV BOLUS
INTRAVENOUS | Status: DC | PRN
  Administered 2020-04-26: 200 mg via INTRAVENOUS

## 2020-04-26 MED ORDER — CHLORTHALIDONE 25 MG PO TABS
25.0000 mg | ORAL_TABLET | Freq: Every day | ORAL | Status: DC
  Administered 2020-04-27: 11:00:00 25 mg via ORAL
  Filled 2020-04-26: qty 1

## 2020-04-26 MED ORDER — LOSARTAN POTASSIUM 50 MG PO TABS
100.0000 mg | ORAL_TABLET | Freq: Every day | ORAL | Status: DC
  Administered 2020-04-27: 11:00:00 100 mg via ORAL
  Filled 2020-04-26: qty 2

## 2020-04-26 MED ORDER — ONDANSETRON HCL 4 MG PO TABS: 4.0000 mg | ORAL_TABLET | Freq: Four times a day (QID) | ORAL | Status: DC | PRN

## 2020-04-26 MED ORDER — OXYCODONE HCL 5 MG/5ML PO SOLN: 5.0000 mg | Freq: Once | ORAL | Status: DC | PRN

## 2020-04-26 MED ORDER — CHLORHEXIDINE GLUCONATE 0.12 % MT SOLN
15.0000 mL | Freq: Once | OROMUCOSAL | Status: AC
  Administered 2020-04-26: 15 mL via OROMUCOSAL
  Filled 2020-04-26: qty 15

## 2020-04-26 MED ORDER — PANTOPRAZOLE SODIUM 40 MG PO TBEC
40.0000 mg | DELAYED_RELEASE_TABLET | Freq: Every day | ORAL | Status: DC
  Administered 2020-04-26: 40 mg via ORAL
  Filled 2020-04-26: qty 1

## 2020-04-26 MED ORDER — DEXAMETHASONE SODIUM PHOSPHATE 10 MG/ML IJ SOLN
INTRAMUSCULAR | Status: DC | PRN
  Administered 2020-04-26: 10 mg via INTRAVENOUS

## 2020-04-26 MED ORDER — ONDANSETRON HCL 4 MG/2ML IJ SOLN
INTRAMUSCULAR | Status: AC
  Filled 2020-04-26: qty 2

## 2020-04-26 MED ORDER — INSULIN ASPART 100 UNIT/ML ~~LOC~~ SOLN
0.0000 [IU] | Freq: Every day | SUBCUTANEOUS | Status: DC
  Administered 2020-04-26: 2 [IU] via SUBCUTANEOUS

## 2020-04-26 MED ORDER — LIDOCAINE-EPINEPHRINE 1 %-1:100000 IJ SOLN
INTRAMUSCULAR | Status: AC
  Filled 2020-04-26: qty 1

## 2020-04-26 MED ORDER — CEFAZOLIN SODIUM-DEXTROSE 2-4 GM/100ML-% IV SOLN
INTRAVENOUS | Status: AC
  Filled 2020-04-26: qty 100

## 2020-04-26 MED ORDER — OXYCODONE HCL 5 MG PO TABS
ORAL_TABLET | ORAL | Status: AC
  Filled 2020-04-26: qty 2

## 2020-04-26 MED ORDER — PROPOFOL 10 MG/ML IV BOLUS
INTRAVENOUS | Status: AC
  Filled 2020-04-26: qty 20

## 2020-04-26 MED ORDER — SUGAMMADEX SODIUM 200 MG/2ML IV SOLN
INTRAVENOUS | Status: DC | PRN
  Administered 2020-04-26: 500 mg via INTRAVENOUS

## 2020-04-26 MED ORDER — ALLOPURINOL 100 MG PO TABS
100.0000 mg | ORAL_TABLET | Freq: Every day | ORAL | Status: DC
  Administered 2020-04-27: 11:00:00 100 mg via ORAL
  Filled 2020-04-26: qty 1

## 2020-04-26 MED ORDER — ROCURONIUM BROMIDE 10 MG/ML (PF) SYRINGE
PREFILLED_SYRINGE | INTRAVENOUS | Status: DC | PRN
  Administered 2020-04-26: 100 mg via INTRAVENOUS
  Administered 2020-04-26: 30 mg via INTRAVENOUS

## 2020-04-26 MED ORDER — BUPIVACAINE LIPOSOME 1.3 % IJ SUSP
INTRAMUSCULAR | Status: DC | PRN
  Administered 2020-04-26: 20 mL

## 2020-04-26 MED ORDER — FENTANYL CITRATE (PF) 250 MCG/5ML IJ SOLN
INTRAMUSCULAR | Status: DC | PRN
  Administered 2020-04-26: 100 ug via INTRAVENOUS

## 2020-04-26 MED ORDER — VITAMIN D 25 MCG (1000 UNIT) PO TABS
1000.0000 [IU] | ORAL_TABLET | Freq: Every day | ORAL | Status: DC
  Administered 2020-04-27: 11:00:00 1000 [IU] via ORAL
  Filled 2020-04-26: qty 1

## 2020-04-26 MED ORDER — BUPIVACAINE LIPOSOME 1.3 % IJ SUSP
20.0000 mL | Freq: Once | INTRAMUSCULAR | Status: DC
  Filled 2020-04-26: qty 20

## 2020-04-26 MED ORDER — ACETAMINOPHEN 325 MG PO TABS
650.0000 mg | ORAL_TABLET | ORAL | Status: DC | PRN
Start: 2020-04-26 — End: 2020-04-27

## 2020-04-26 MED ORDER — FENTANYL CITRATE (PF) 100 MCG/2ML IJ SOLN
25.0000 ug | INTRAMUSCULAR | Status: DC | PRN
  Administered 2020-04-26: 50 ug via INTRAVENOUS

## 2020-04-26 MED ORDER — HYDROMORPHONE HCL 1 MG/ML IJ SOLN: 0.5000 mg | INTRAMUSCULAR | Status: DC | PRN

## 2020-04-26 MED ORDER — CYCLOBENZAPRINE HCL 10 MG PO TABS
10.0000 mg | ORAL_TABLET | Freq: Three times a day (TID) | ORAL | Status: DC | PRN
Start: 2020-04-26 — End: 2020-04-27
  Administered 2020-04-26 – 2020-04-27 (×2): 10 mg via ORAL
  Filled 2020-04-26 (×2): qty 1

## 2020-04-26 MED ORDER — FERROUS SULFATE 325 (65 FE) MG PO TABS
325.0000 mg | ORAL_TABLET | Freq: Every day | ORAL | Status: DC
  Administered 2020-04-27: 11:00:00 325 mg via ORAL
  Filled 2020-04-26: qty 1

## 2020-04-26 MED ORDER — THROMBIN 20000 UNITS EX SOLR: CUTANEOUS | Status: DC | PRN

## 2020-04-26 MED ORDER — ASPIRIN EC 81 MG PO TBEC
81.0000 mg | DELAYED_RELEASE_TABLET | Freq: Every day | ORAL | Status: DC
  Administered 2020-04-27: 11:00:00 81 mg via ORAL
  Filled 2020-04-26: qty 1

## 2020-04-26 MED ORDER — PANTOPRAZOLE SODIUM 40 MG IV SOLR: 40.0000 mg | Freq: Every day | INTRAVENOUS | Status: DC

## 2020-04-26 MED ORDER — ONDANSETRON HCL 4 MG/2ML IJ SOLN: 4.0000 mg | Freq: Once | INTRAMUSCULAR | Status: DC | PRN

## 2020-04-26 SURGICAL SUPPLY — 79 items
ADH SKN CLS APL DERMABOND .7 (GAUZE/BANDAGES/DRESSINGS) ×1
APL SKNCLS STERI-STRIP NONHPOA (GAUZE/BANDAGES/DRESSINGS) ×1
BASKET BONE COLLECTION (BASKET) ×3 IMPLANT
BENZOIN TINCTURE PRP APPL 2/3 (GAUZE/BANDAGES/DRESSINGS) ×3 IMPLANT
BLADE CLIPPER SURG (BLADE) IMPLANT
BLADE SURG 11 STRL SS (BLADE) ×3 IMPLANT
BONE VIVIGEN FORMABLE 5.4CC (Bone Implant) ×3 IMPLANT
BUR CUTTER 7.0 ROUND (BURR) ×3 IMPLANT
BUR MATCHSTICK NEURO 3.0 LAGG (BURR) ×3 IMPLANT
CANISTER SUCT 3000ML PPV (MISCELLANEOUS) ×3 IMPLANT
CAP LOCKING THREADED (Cap) ×18 IMPLANT
CARTRIDGE OIL MAESTRO DRILL (MISCELLANEOUS) ×1 IMPLANT
CLOSURE STERI-STRIP 1/4X4 (GAUZE/BANDAGES/DRESSINGS) ×3 IMPLANT
CLOSURE WOUND 1/2 X4 (GAUZE/BANDAGES/DRESSINGS) ×2
CNTNR URN SCR LID CUP LEK RST (MISCELLANEOUS) ×1 IMPLANT
CONT SPEC 4OZ STRL OR WHT (MISCELLANEOUS) ×3
COVER BACK TABLE 60X90IN (DRAPES) ×3 IMPLANT
COVER WAND RF STERILE (DRAPES) IMPLANT
DECANTER SPIKE VIAL GLASS SM (MISCELLANEOUS) ×3 IMPLANT
DERMABOND ADVANCED (GAUZE/BANDAGES/DRESSINGS) ×2
DERMABOND ADVANCED .7 DNX12 (GAUZE/BANDAGES/DRESSINGS) ×1 IMPLANT
DIFFUSER DRILL AIR PNEUMATIC (MISCELLANEOUS) ×3 IMPLANT
DRAPE C-ARM 42X72 X-RAY (DRAPES) ×3 IMPLANT
DRAPE C-ARMOR (DRAPES) IMPLANT
DRAPE HALF SHEET 40X57 (DRAPES) IMPLANT
DRAPE LAPAROTOMY 100X72X124 (DRAPES) ×3 IMPLANT
DRAPE SURG 17X23 STRL (DRAPES) ×3 IMPLANT
DRSG OPSITE 4X5.5 SM (GAUZE/BANDAGES/DRESSINGS) ×3 IMPLANT
DRSG OPSITE POSTOP 4X6 (GAUZE/BANDAGES/DRESSINGS) ×3 IMPLANT
DRSG OPSITE POSTOP 4X8 (GAUZE/BANDAGES/DRESSINGS) ×3 IMPLANT
DURAPREP 26ML APPLICATOR (WOUND CARE) ×3 IMPLANT
ELECT REM PT RETURN 9FT ADLT (ELECTROSURGICAL) ×3
ELECTRODE REM PT RTRN 9FT ADLT (ELECTROSURGICAL) ×1 IMPLANT
EVACUATOR 1/8 PVC DRAIN (DRAIN) ×3 IMPLANT
EVACUATOR 3/16  PVC DRAIN (DRAIN) ×2
EVACUATOR 3/16 PVC DRAIN (DRAIN) ×1 IMPLANT
GAUZE 4X4 16PLY RFD (DISPOSABLE) ×3 IMPLANT
GAUZE SPONGE 4X4 12PLY STRL (GAUZE/BANDAGES/DRESSINGS) ×3 IMPLANT
GLOVE BIO SURGEON STRL SZ7 (GLOVE) IMPLANT
GLOVE BIO SURGEON STRL SZ8 (GLOVE) ×6 IMPLANT
GLOVE BIOGEL PI IND STRL 7.0 (GLOVE) IMPLANT
GLOVE BIOGEL PI INDICATOR 7.0 (GLOVE)
GLOVE EXAM NITRILE XL STR (GLOVE) IMPLANT
GLOVE INDICATOR 8.5 STRL (GLOVE) ×6 IMPLANT
GOWN STRL REUS W/ TWL LRG LVL3 (GOWN DISPOSABLE) IMPLANT
GOWN STRL REUS W/ TWL XL LVL3 (GOWN DISPOSABLE) ×2 IMPLANT
GOWN STRL REUS W/TWL 2XL LVL3 (GOWN DISPOSABLE) IMPLANT
GOWN STRL REUS W/TWL LRG LVL3 (GOWN DISPOSABLE)
GOWN STRL REUS W/TWL XL LVL3 (GOWN DISPOSABLE) ×6
HEMOSTAT POWDER KIT SURGIFOAM (HEMOSTASIS) IMPLANT
KIT BASIN OR (CUSTOM PROCEDURE TRAY) ×3 IMPLANT
KIT POSITION SURG JACKSON T1 (MISCELLANEOUS) ×3 IMPLANT
KIT TURNOVER KIT B (KITS) ×3 IMPLANT
MILL MEDIUM DISP (BLADE) ×3 IMPLANT
NEEDLE HYPO 21X1.5 SAFETY (NEEDLE) ×3 IMPLANT
NEEDLE HYPO 25X1 1.5 SAFETY (NEEDLE) ×3 IMPLANT
NS IRRIG 1000ML POUR BTL (IV SOLUTION) ×6 IMPLANT
OIL CARTRIDGE MAESTRO DRILL (MISCELLANEOUS) ×3
PACK LAMINECTOMY NEURO (CUSTOM PROCEDURE TRAY) ×3 IMPLANT
PAD ARMBOARD 7.5X6 YLW CONV (MISCELLANEOUS) ×9 IMPLANT
PATTIES SURGICAL .5 X.5 (GAUZE/BANDAGES/DRESSINGS) ×3 IMPLANT
PATTIES SURGICAL 1X1 (DISPOSABLE) ×3 IMPLANT
ROD 65MM SPINAL (Rod) ×4 IMPLANT
ROD SPNL 5.5 CREO TI 65 (Rod) ×2 IMPLANT
SCREW PA THRD CREO TULIP 5.5X4 (Head) ×18 IMPLANT
SHAFT CREO 30MM (Neuro Prosthesis/Implant) ×18 IMPLANT
SPACER SUSTAIN TI 8X26X11 8D (Spacer) ×12 IMPLANT
SPONGE LAP 4X18 RFD (DISPOSABLE) IMPLANT
SPONGE SURGIFOAM ABS GEL 100 (HEMOSTASIS) ×6 IMPLANT
STRIP CLOSURE SKIN 1/2X4 (GAUZE/BANDAGES/DRESSINGS) ×4 IMPLANT
SUT VIC AB 0 CT1 18XCR BRD8 (SUTURE) ×1 IMPLANT
SUT VIC AB 0 CT1 8-18 (SUTURE) ×3
SUT VIC AB 2-0 CT1 18 (SUTURE) ×3 IMPLANT
SUT VIC AB 4-0 PS2 27 (SUTURE) ×3 IMPLANT
SYR 20ML LL LF (SYRINGE) ×3 IMPLANT
TOWEL GREEN STERILE (TOWEL DISPOSABLE) ×3 IMPLANT
TOWEL GREEN STERILE FF (TOWEL DISPOSABLE) ×3 IMPLANT
TRAY FOLEY MTR SLVR 16FR STAT (SET/KITS/TRAYS/PACK) ×3 IMPLANT
WATER STERILE IRR 1000ML POUR (IV SOLUTION) ×3 IMPLANT

## 2020-04-26 NOTE — Progress Notes (Signed)
PHARMACY NOTE:  ANTIMICROBIAL RENAL DOSAGE ADJUSTMENT  Current antimicrobial regimen includes a mismatch between antimicrobial dosage and estimated renal function.  As per policy approved by the Pharmacy & Therapeutics and Medical Executive Committees, the antimicrobial dosage will be adjusted accordingly.  Current antimicrobial dosage:  Cefazolin 2 g IV every 8 hours for 2 doses  Indication: Surgical Prophylaxis  Renal Function:  Estimated Creatinine Clearance: 23.6 mL/min (A) (by C-G formula based on SCr of 2.58 mg/dL (H)). []      On intermittent HD, scheduled: []      On CRRT    Antimicrobial dosage has been changed to:  Cefazolin adjusted to q12 hours given renal function for 2 doses  Additional comments:   Thank you for allowing pharmacy to be a part of this patient's care.  Antonietta Jewel, PharmD, [facility] Clinical Pharmacist  Phone: [phone] 04/26/2020 5:12 PM  Please check AMION for all [place] phone numbers After 10:00 PM, call [place] [phone]

## 2020-04-26 NOTE — Anesthesia Postprocedure Evaluation (Signed)
Anesthesia Post Note  Patient: Michelle Abbott  Procedure(s) Performed: Posterior Lumbar Interbody Fusion - Lumbar Three-Lumbar Four - Lumbar Four-Lumbar Five - Interbody Fusion (N/A Spine Lumbar)     Patient location during evaluation: PACU Anesthesia Type: General Level of consciousness: awake and alert Pain management: pain level controlled Vital Signs Assessment: post-procedure vital signs reviewed and stable Respiratory status: spontaneous breathing, nonlabored ventilation and respiratory function stable Cardiovascular status: blood pressure returned to baseline and stable Postop Assessment: no apparent nausea or vomiting Anesthetic complications: no   No complications documented.  Last Vitals:  Vitals:   04/26/20 1615 04/26/20 1656  BP: (!) 153/62 (!) 192/85  Pulse: 68 72  Resp: (!) 9 18  Temp:  36.8 C  SpO2: 98% 95%    Last Pain:  Vitals:   04/26/20 1630  TempSrc:   PainSc: 3                  Lidia Collum

## 2020-04-26 NOTE — Op Note (Signed)
Preoperative diagnosis: Lumbar spinal stenosis with severe facet arthropathy bilateral L3-L4 radiculopathies at L3-4 and grade 1 spondylolisthesis L4-5 with severe spinal stenosis severe facet arthropathy and L4-L5 radiculopathies  Postoperative diagnosis: Same  Procedure: #1 complete decompressive laminectomy with complete medial facetectomies at L3-4 and L4-5 and radical foraminotomies of L3, L4, and L5 nerve roots bilaterally.  2.  Posterior lumbar interbody fusion utilizing the globus insert and rotate titanium cages packed with locally harvested autograft mixed with the vivigen at L3-4 and L4-5  3.  Cortical screw fixation L3-L5 utilizing the globus Creo amp modular cortical screw set  4.  Open reduction spinal deformity L4-5  Surgeon: Dominica Severin Eudell Julian  Assistant: Deatra Ina  Anesthesia: General  EBL: 49  HPI: 74 year old female with progressive worsening back and bilateral hip and leg pain worse on the right work-up revealed severe spinal stenosis L3-4 and L4-5 due to the patient's progression clinical syndrome imaging findings failed conservative treatment I recommended decompressive laminectomy and interbody fusions at L3-4 and L4-5.  I extensively went over the risks and benefits of that operation with her as well as perioperative course expectations of outcome and alternatives to surgery and she understood and agreed to proceed forward.  Operative procedure: Patient was brought into the OR was due to general anesthesia positioned prone the Wilson frame her back was prepped and draped in routine sterile fashion.Marland Kitchen  Utilizing anatomical landmarks I drew out a midline incision after infiltration of 10 cc lidocaine with epi midline incision was made the subcutaneous tissue was dissected free subperiosteal dissection was carried lamina of L to 3 4 and 5 bilaterally identified the cortical screw entry points at L4 with intraoperative x-ray.  Then remove the spinous process of L3 and L4 both  started performing complete central decompression.  There was marked hourglass compression both at 3 4 and 4 5 with severe thecal sac compression and also an extensive mount of scar tissue and epidural adherence.  This was all dissected off the dura complete medial facetectomies were performed bilaterally I blew through the pars at each level performing radical foraminotomies of the L3, L4 and L5 nerve roots bilaterally.  Aggressively under bit the superior to collating facet at both levels bilaterally to gain access lateral margin of the disc base.  Epidural veins were coagulated the space was incised first working the 3 4 bilaterally utilizing sequential distraction with an 11 distractor in place I sized up 8 mm wide 12 mm tall 8 degree lordotic implants and cleaned out the disc space it was a large amount of central disc compartment that was removed endplates were all prepared adequately inserted the right-sided cages then remove the distractors cleaned out the disc base and the contralateral side packed an extensive mount of autograft mix centrally and then inserted the left-sided cages.  Fluoroscopy confirmed good position of all the implants then under fluoroscopic guidance cortical screw entry points were selected pilot holes were drilled holes were drilled to 25 mm tapped and then 660/5 oh by 30 mm screws were placed all screws with excellent purchase.  Postop fluoroscopy confirmed good position of the implants.  I inspected all the foramina to confirm patency and no migration of graft material I then assembled the heads of the screws advanced the screws some more tightened down the L5 screw compressed L4 against L5 and L3 against L4.  Then anchored everything in place place Gelfoam over the dura placed a medium Hemovac drain.  I injected Exparel in the fascia and  closed the wound in layers with interrupted Vicryl in a running 4 subcuticular Dermabond benzoin Steri-Strips and a sterile dressing was applied  patient recovery room in stable condition.  At the end the case all needle counts and sponge counts were correct.

## 2020-04-26 NOTE — Anesthesia Procedure Notes (Signed)
Procedure Name: Intubation Date/Time: 04/26/2020 10:56 AM Performed by: Bryson Corona, CRNA Pre-anesthesia Checklist: Patient identified, Emergency Drugs available, Suction available and Patient being monitored Patient Re-evaluated:Patient Re-evaluated prior to induction Oxygen Delivery Method: Circle System Utilized Preoxygenation: Pre-oxygenation with 100% oxygen Induction Type: IV induction Ventilation: Mask ventilation without difficulty and Oral airway inserted - appropriate to patient size Laryngoscope Size: Mac and 3 Grade View: Grade I Tube type: Oral Number of attempts: 1 Airway Equipment and Method: Stylet and Oral airway Placement Confirmation: ETT inserted through vocal cords under direct vision,  positive ETCO2 and breath sounds checked- equal and bilateral Secured at: 22 cm Tube secured with: Tape Dental Injury: Teeth and Oropharynx as per pre-operative assessment

## 2020-04-26 NOTE — H&P (Signed)
Michelle Abbott is an 74 y.o. female.   Chief Complaint: Back and bilateral hip and leg pain HPI: 74 year old female progressive worsening back bilateral hip and leg pain rating down an L4-L5 nerve root pattern work-up revealed severe spinal stenosis L3-4 L4-5 with neurogenic claudication.  Due to patient's progression of clinical syndrome imaging findings failed conservative treatment of recommended decompression interbody fusions at those levels.  I extensively went over the risks and benefits of that operation with her as well as perioperative course expectations of outcome and alternatives of surgery and she understands and agrees to proceed forward.  Past Medical History:  Diagnosis Date  . Anemia   . Arthritis   . Chronic kidney disease    Stage 4  . Diabetes mellitus without complication ([place])    type 2  . Gout 2017  . Hyperlipidemia   . Hypertension   . Sciatica   . Vitamin D deficiency 2014    Past Surgical History:  Procedure Laterality Date  . BREAST SURGERY Right 1961   Benign lump removed  . CATARACT EXTRACTION, BILATERAL Bilateral 2017  . CESAREAN SECTION  1985  . CESAREAN SECTION  1990  . CHOLECYSTECTOMY  1999  . COLONOSCOPY    . DILATION AND CURETTAGE OF UTERUS  2017  . KNEE ARTHROSCOPY Right   . VAGINAL SEPTOPLASTY  1966    Family History  Problem Relation Age of Onset  . Cancer Mother        ovarian and pancreatic  . Diabetes Mother   . Stroke Mother   . COPD Father   . Arthritis Father   . Cancer Brother        Lung  . COPD Brother    Social History:  reports that she quit smoking about 22 years ago. Her smoking use included cigarettes. She has a 45.00 pack-year smoking history. She has never used smokeless tobacco. She reports that she does not drink alcohol and does not use drugs.  Allergies:  Allergies  Allergen Reactions  . Ciprofloxacin Hcl Other (See Comments)    Burning at IV site  . Metformin And Related Other (See Comments)    CKD   .  Ace Inhibitors Cough    Lisinopril  . Amlodipine Swelling    Lower leg swelling.   . Baclofen Nausea Only and Other (See Comments)    Disoriented, dizziness, weakness, fatigue.     Medications Prior to Admission  Medication Sig Dispense Refill  . allopurinol (ZYLOPRIM) 100 MG tablet TAKE 1 TABLET BY MOUTH  DAILY (Patient taking differently: Take 100 mg by mouth daily.) 90 tablet 3  . aspirin EC 81 MG tablet Take 81 mg by mouth daily. Swallow whole.    . carvedilol (COREG) 25 MG tablet Take 25 mg by mouth 2 (two) times daily with a meal.    . chlorthalidone (HYGROTON) 25 MG tablet TAKE 1 TABLET BY MOUTH  DAILY (Patient taking differently: Take 25 mg by mouth daily.) 90 tablet 3  . cholecalciferol (VITAMIN D) 1000 units tablet Take 1,000 Units by mouth daily.    . colchicine 0.6 MG tablet Take 2 tablets (1.2 mg total) at onset of gout flare then take 1 tablet (0.6 mg) one hour later if symptoms persist. (MAX: 1.8 mg total daily) (Patient taking differently: Take 0.6-1.2 mg by mouth See admin instructions. Take 2 tablets (1.2 mg total) at onset of gout flare then take 1 tablet (0.6 mg) one hour later if symptoms persist. (MAX: 1.8 mg total  daily)) 30 tablet 1  . ferrous sulfate 325 (65 FE) MG tablet Take 325 mg by mouth daily.    Marland Kitchen LANTUS SOLOSTAR 100 UNIT/ML Solostar Pen Inject 40 Units into the skin daily. Increase as instructed. Max dose 50 units. (Patient taking differently: Inject 40 Units into the skin every morning. Increase as instructed. Max dose 50 units.) 15 pen 5  . losartan (COZAAR) 100 MG tablet Take 1 tablet (100 mg total) by mouth daily. 90 tablet 3  . Multiple Vitamin (MULTIVITAMIN WITH MINERALS) TABS tablet Take 1 tablet by mouth daily.    Marland Kitchen OZEMPIC, 0.25 OR 0.5 MG/DOSE, 2 MG/1.5ML SOPN INJECT 0.5 MG INTO THE SKIN WEEKLY (Patient taking differently: Inject 0.5 mg into the muscle every Wednesday.) 4.5 mL 3  . polyethylene glycol powder (GLYCOLAX/MIRALAX) powder Take 17 g by mouth 2  (two) times daily as needed. 3350 g 1  . rosuvastatin (CRESTOR) 40 MG tablet TAKE 1 TABLET BY MOUTH  DAILY (Patient taking differently: Take 40 mg by mouth daily.) 90 tablet 3  . traMADol (ULTRAM) 50 MG tablet Take 50 mg by mouth every 6 (six) hours as needed for moderate pain.     . vitamin B-12 (CYANOCOBALAMIN) 1000 MCG tablet Take 1,000 mcg by mouth daily.     Marland Kitchen ACCU-CHEK FASTCLIX LANCETS MISC TEST BLOOD SUGAR FOUR TIMES DAILY 408 each 11  . B-D ULTRAFINE III SHORT PEN 31G X 8 MM MISC Use to give insulin twice a day and victoza once a day. Dx code = E11.9 300 each 3  . glucose blood (ONETOUCH ULTRA) test strip 1 each by Other route daily. 100 each 4    Results for orders placed or performed during the hospital encounter of 04/26/20 (from the past 48 hour(s))  Glucose, capillary     Status: Abnormal   Collection Time: 04/26/20  9:32 AM  Result Value Ref Range   Glucose-Capillary 154 (H) 70 - 99 mg/dL    Comment: Glucose reference range applies only to samples taken after fasting for at least 8 hours.   Comment 1 Notify RN    Comment 2 Document in Chart    No results found.  Review of Systems  Musculoskeletal: Positive for back pain.  Neurological: Positive for numbness.    Blood pressure (!) 191/86, pulse 77, temperature (!) 97.5 F (36.4 C), temperature source Oral, resp. rate 18, height 5\' 3"  (1.6 m), weight 111.4 kg, SpO2 100 %. Physical Exam HENT:     Head: Normocephalic.     Right Ear: Tympanic membrane normal.     Nose: Nose normal.     Mouth/Throat:     Mouth: Mucous membranes are moist.  Eyes:     Pupils: Pupils are equal, round, and reactive to light.  Cardiovascular:     Rate and Rhythm: Normal rate.     Pulses: Normal pulses.  Pulmonary:     Effort: Pulmonary effort is normal.  Abdominal:     General: Abdomen is flat.  Musculoskeletal:        General: Normal range of motion.     Cervical back: Normal range of motion.  Skin:    General: Skin is warm.   Neurological:     General: No focal deficit present.     Mental Status: She is alert.     Comments: Patient awake and alert strength 5-5 iliopsoas, quads, hamstrings, gastrocs, tibialis, and EHL.  Psychiatric:        Mood and Affect: Mood normal.  Assessment/Plan 74 year old woman presents for L3-4 L4-5 posterior lumbar interbody fusion  Elaina Hoops, MD 04/26/2020, 10:20 AM

## 2020-04-26 NOTE — Progress Notes (Signed)
Orthopedic Tech Progress Note Patient Details:  Michelle Abbott July 11, 1945 [id number] Patient has Brace Patient ID: Michelle Abbott, female   DOB: August 12, 1945, 74 y.o.   MRN: [id number]   Michelle Abbott 04/26/2020, 5:31 PM

## 2020-04-26 NOTE — Transfer of Care (Signed)
Immediate Anesthesia Transfer of Care Note  Patient: Michelle Abbott  Procedure(s) Performed: Posterior Lumbar Interbody Fusion - Lumbar Three-Lumbar Four - Lumbar Four-Lumbar Five - Interbody Fusion (N/A Spine Lumbar)  Patient Location: PACU  Anesthesia Type:General  Level of Consciousness: awake and alert   Airway & Oxygen Therapy: Patient Spontanous Breathing and Patient connected to face mask oxygen  Post-op Assessment: Report given to RN and Post -op Vital signs reviewed and stable  Post vital signs: Reviewed and stable  Last Vitals:  Vitals Value Taken Time  BP 162/68 04/26/20 1544  Temp    Pulse 77 04/26/20 1545  Resp 13 04/26/20 1545  SpO2 100 % 04/26/20 1545  Vitals shown include unvalidated device data.  Last Pain:  Vitals:   04/26/20 0932  TempSrc: Oral  PainSc: 7       Patients Stated Pain Goal: 3 (12/05/80 8833)  Complications: No complications documented.

## 2020-04-27 LAB — GLUCOSE, CAPILLARY: Glucose-Capillary: 171 mg/dL — ABNORMAL HIGH (ref 70–99)

## 2020-04-27 MED ORDER — OXYCODONE HCL 10 MG PO TABS: 10.0000 mg | ORAL_TABLET | ORAL | 0 refills | Status: DC | PRN

## 2020-04-27 NOTE — Progress Notes (Signed)
Occupational Therapy Evaluation Patient Details Name: Michelle Abbott MRN: [id number] DOB: [birth year] Today's Date: 04/27/2020    History of Present Illness 74 yo s/p decompressive laminectomy  adn PLIF L3-L5. PMH: arthritis, CKD, DM, HTN, sciatica and morbid obesity.   Clinical Impression   PTA pt lives alone and was modified independent with ADL and mobility although mobility and ability to complete ADL tasks had become more difficult due to back pain and R leg weakness. Began education regarding compensatory techniques and use of AE/DME to maximize independence with mobility and self-care. Pt will have assistance from her daughter and a friend after DC. Will return to complete education regarding use of AE for ADL.     Follow Up Recommendations  No OT follow up;Supervision - Intermittent    Equipment Recommendations  3 in 1 bedside commode;Tub/shower bench    Recommendations for Other Services PT consult     Precautions / Restrictions Precautions Precautions: Back Precaution Booklet Issued: Yes (comment) Required Braces or Orthoses: Spinal Brace Spinal Brace: Lumbar corset (can remove for showers; nighttime trips to bathroom) Restrictions Weight Bearing Restrictions: No      Mobility Bed Mobility Overal bed mobility: Needs Assistance Bed Mobility: Sidelying to Sit   Sidelying to sit: Min guard       General bed mobility comments: vc for correct technique; increased difficulty due to body habitus    Transfers Overall transfer level: Needs assistance Equipment used: Rolling walker (2 wheeled) Transfers: Sit to/from Stand Sit to Stand: Min guard         General transfer comment: vc for technique    Balance Overall balance assessment: Needs assistance   Sitting balance-Leahy Scale: Good       Standing balance-Leahy Scale: Fair                             ADL either performed or assessed with clinical judgement   ADL Overall ADL's : Needs  assistance/impaired                                     Functional mobility during ADLs: Supervision/safety General ADL Comments: unable to complete figure four positioning and requires use of AE to complete LB ADL. Educated pt on useof racherf, long handled sponge, sock aid to assist with LB ADL. Pt able to return demosntrate with min vc. Educated pt on use of toilet aid for peri care. Educated on compensatory techniques for grooming in order to maintain back precautions. Pt had difficultycompleting tub transfers prior to back surgery and bent over to grab bar and states she had difficulty lifting her R leg over the tub so she had begun to just do "sink wah ups". Will need tub bench.     Vision         Perception     Praxis      Pertinent Vitals/Pain Pain Assessment: Faces Faces Pain Scale: Hurts little more Pain Location: back Pain Descriptors / Indicators: Aching;Discomfort Pain Intervention(s): Limited activity within patient's tolerance;Premedicated before session     Hand Dominance Right   Extremity/Trunk Assessment Upper Extremity Assessment Upper Extremity Assessment: Overall WFL for tasks assessed   Lower Extremity Assessment Lower Extremity Assessment: Defer to PT evaluation (R leg weakness)   Cervical / Trunk Assessment Cervical / Trunk Assessment: Other exceptions (back surgery; increased body habitus)   Communication Communication Communication: No  difficulties   Cognition Arousal/Alertness: Awake/alert Behavior During Therapy: WFL for tasks assessed/performed Overall Cognitive Status: Within Functional Limits for tasks assessed                                     General Comments       Exercises     Shoulder Instructions      Home Living Family/patient expects to be discharged to:: Private residence Living Arrangements: Alone Available Help at Discharge: Family;Available 24 hours/day (daughter lives in same  complex) Type of Home: Apartment Home Access: Stairs to enter [employer] of Steps: 2   Home Layout: One level     Bathroom Shower/Tub: Corporate investment banker: Standard Bathroom Accessibility: Yes How Accessible: Accessible via walker Home Equipment: Cane - single point;Grab bars - tub/shower;Hand held shower head          Prior Functioning/Environment Level of Independence: Independent with assistive device(s)        Comments: uses cane        OT Problem List: Decreased activity tolerance;Decreased knowledge of use of DME or AE;Decreased knowledge of precautions;Obesity;Pain      OT Treatment/Interventions: Self-care/ADL training;DME and/or AE instruction;Therapeutic activities;Patient/family education    OT Goals(Current goals can be found in the care plan section) Acute Rehab OT Goals Patient Stated Goal: to be able to take care of herself and clean her house OT Goal Formulation: With patient Time For Goal Achievement: 05/04/20 Potential to Achieve Goals: Good  OT Frequency: Min 2X/week   Barriers to D/C:            Co-evaluation              AM-PAC OT "6 Clicks" Daily Activity     Outcome Measure Help from another person eating meals?: None Help from another person taking care of personal grooming?: A Little Help from another person toileting, which includes using toliet, bedpan, or urinal?: A Lot Help from another person bathing (including washing, rinsing, drying)?: A Lot Help from another person to put on and taking off regular upper body clothing?: A Little Help from another person to put on and taking off regular lower body clothing?: A Lot 6 Click Score: 16   End of Session Equipment Utilized During Treatment: Rolling walker;Back brace Nurse Communication: Mobility status;Other (comment) (DC needs)  Activity Tolerance: Patient tolerated treatment well Patient left: in chair;with call bell/phone within  reach  OT Visit Diagnosis: Other abnormalities of gait and mobility (R26.89);Muscle weakness (generalized) (M62.81);Pain Pain - part of body:  (back)                Time: 1610-9604 OT Time Calculation (min): 20 min Charges:  OT General Charges $OT Visit: 1 Visit OT Evaluation $OT Eval Low Complexity: [place], OT/L   Acute OT Clinical Specialist Acute Rehabilitation Services Pager [phone] Office [phone]   [facility] 04/27/2020, 9:32 AM

## 2020-04-27 NOTE — Progress Notes (Signed)
Patient is discharged from oom 3C03 at this time. Alert and in stable condition. IV site d/c'd and instructions read to patient and daughter with understanding verbalized and all questions answered. Left unit via wheelchair with all belongings at side.

## 2020-04-27 NOTE — Progress Notes (Signed)
Occupational Therapy Treatment Note Completed education regarding use of AE/ DME to maximize independence with ADL and functional mobility for ADL. Pt verbalized understanding/return demonstrated. Pt very appreciative. No further OT needed.     04/27/20 0935  OT Visit Information  Last OT Received On 04/27/20  Assistance Needed +1  History of Present Illness 74 yo s/p decompressive laminectomy  adn PLIF L3-L5. PMH: arthritis, CKD, DM, HTN, sciatica and morbid obesity.  Precautions  Precautions Back  Required Braces or Orthoses Spinal Brace  Spinal Brace Lumbar corset  Pain Assessment  Pain Assessment Faces  Faces Pain Scale 2  Pain Location back  Pain Descriptors / Indicators Aching;Discomfort  Pain Intervention(s) Limited activity within patient's tolerance  Cognition  Arousal/Alertness Awake/alert  Behavior During Therapy WFL for tasks assessed/performed  Overall Cognitive Status Within Functional Limits for tasks assessed  ADL  General ADL Comments Completed education on use of AE. Pt able to use reacher to donn underwear and ableto follow precautions for donning pants. Requires use of sock -aid for socks. Able to return demosntrate. Demonstrated use of toilet tong and recommended use of wet wipes folded around tong for pericare. Educated further regarding home set up to incrrease independence and accesibility while adhering to back precautions. Pt issued reacher, sock-aid, long handled sponge and toilet tong.  OT - End of Session  Equipment Utilized During Treatment Rolling walker;Back brace  Activity Tolerance Patient tolerated treatment well  Patient left in chair;with call bell/phone within reach  Nurse Communication Other (comment) (DC needs)  OT Assessment/Plan  OT Plan All goals met and education completed, patient discharged from OT services  OT Visit Diagnosis Other abnormalities of gait and mobility (R26.89);Muscle weakness (generalized) (M62.81);Pain  Pain - part of  body  (back)  Recommendations for Other Services PT consult  Follow Up Recommendations No OT follow up;Supervision - Intermittent  OT Equipment 3 in 1 bedside commode;Tub/shower bench  AM-PAC OT "6 Clicks" Daily Activity Outcome Measure (Version 2)  Help from another person eating meals? 4  Help from another person taking care of personal grooming? 3  Help from another person toileting, which includes using toliet, bedpan, or urinal? 3  Help from another person bathing (including washing, rinsing, drying)? 3  Help from another person to put on and taking off regular upper body clothing? 3  Help from another person to put on and taking off regular lower body clothing? 3  6 Click Score 19  OT Goal Progression  Progress towards OT goals Goals met/education completed, patient discharged from OT  Acute Rehab OT Goals  Patient Stated Goal to be able to take care of herself and clean her house  OT Goal Formulation With patient  Time For Goal Achievement 05/04/20  Potential to Achieve Goals Good  OT Time Calculation  OT Start Time (ACUTE ONLY) 0825  OT Stop Time (ACUTE ONLY) 0840  OT Time Calculation (min) 15 min  OT General Charges  $OT Visit 1 Visit  OT Treatments  $Self Care/Home Management  8-22 mins  Maurie Boettcher, OT/L   Acute OT Clinical Specialist [place] Pager [phone] Office [phone]

## 2020-04-27 NOTE — Discharge Instructions (Signed)

## 2020-04-27 NOTE — Discharge Summary (Signed)
Physician Discharge Summary  Patient ID: Michelle Abbott MRN: [id number] DOB/AGE: 1945/10/09 74 y.o.  Admit date: 04/26/2020 Discharge date: 04/27/2020  Admission Diagnoses: stenosis    Discharge Diagnoses: same   Discharged Condition: good  Hospital Course: The patient was admitted on 04/26/2020 and taken to the operating room where the patient underwent PLIF. The patient tolerated the procedure well and was taken to the recovery room and then to the floor in stable condition. The hospital course was routine. There were no complications. The wound remained clean dry and intact. Pt had appropriate back soreness. No complaints of leg pain or new N/T/W. The patient remained afebrile with stable vital signs, and tolerated a regular diet. The patient continued to increase activities, and pain was well controlled with oral pain medications.   Consults: None  Significant Diagnostic Studies:  Results for orders placed or performed during the hospital encounter of 04/26/20  Glucose, capillary  Result Value Ref Range   Glucose-Capillary 154 (H) 70 - 99 mg/dL   Comment 1 Notify RN    Comment 2 Document in Chart   Glucose, capillary  Result Value Ref Range   Glucose-Capillary 139 (H) 70 - 99 mg/dL   Comment 1 Notify RN    Comment 2 Document in Chart   Glucose, capillary  Result Value Ref Range   Glucose-Capillary 173 (H) 70 - 99 mg/dL  Glucose, capillary  Result Value Ref Range   Glucose-Capillary 214 (H) 70 - 99 mg/dL   Comment 1 Notify RN    Comment 2 Document in Chart   Glucose, capillary  Result Value Ref Range   Glucose-Capillary 171 (H) 70 - 99 mg/dL   Comment 1 Notify RN    Comment 2 Document in Chart     DG Lumbar Spine 2-3 Views  Result Date: 04/26/2020 CLINICAL DATA:  Surgery, elective. Additional history provided: Posterior lumbar interbody fusion-lumbar 3-lumbar 4, lumbar 4-lumbar 5. Provided fluoroscopy time 1 minutes and 1 second (64.62 mGy). EXAM: LUMBAR SPINE -  2-3 VIEW; DG C-ARM 1-60 MIN COMPARISON:  Lumbar spine MRI 09/25/2019. Lumbar spine radiographs 03/19/2020. FINDINGS: PA and lateral view intraoperative fluoroscopic images of the lumbar spine are submitted, 2 images total. The images demonstrate bilateral pedicle screws at the L3, L4 and L5 levels. Vertical interconnecting rods were not present at the time the images were taken. L3-L4 and L4-L5 interbody devices. IMPRESSION: Two intraoperative fluoroscopic images of the lumbar spine, as described. Electronically Signed   By: Kellie Simmering DO   On: 04/26/2020 15:44   DG C-Arm 1-60 Min  Result Date: 04/26/2020 CLINICAL DATA:  Surgery, elective. Additional history provided: Posterior lumbar interbody fusion-lumbar 3-lumbar 4, lumbar 4-lumbar 5. Provided fluoroscopy time 1 minutes and 1 second (64.62 mGy). EXAM: LUMBAR SPINE - 2-3 VIEW; DG C-ARM 1-60 MIN COMPARISON:  Lumbar spine MRI 09/25/2019. Lumbar spine radiographs 03/19/2020. FINDINGS: PA and lateral view intraoperative fluoroscopic images of the lumbar spine are submitted, 2 images total. The images demonstrate bilateral pedicle screws at the L3, L4 and L5 levels. Vertical interconnecting rods were not present at the time the images were taken. L3-L4 and L4-L5 interbody devices. IMPRESSION: Two intraoperative fluoroscopic images of the lumbar spine, as described. Electronically Signed   By: Kellie Simmering DO   On: 04/26/2020 15:44    Antibiotics:  Anti-infectives (From admission, onward)   Start     Dose/Rate Route Frequency Ordered Stop   04/26/20 1900  ceFAZolin (ANCEF) IVPB 2g/100 mL premix  Status:  Discontinued  2 g 200 mL/hr over 30 Minutes Intravenous Every 8 hours 04/26/20 1639 04/26/20 1710   04/26/20 1900  ceFAZolin (ANCEF) IVPB 2g/100 mL premix        2 g 200 mL/hr over 30 Minutes Intravenous Every 12 hours 04/26/20 1710 04/27/20 2159   04/26/20 1030  ceFAZolin (ANCEF) IVPB 2g/100 mL premix  Status:  Discontinued        2 g 200  mL/hr over 30 Minutes Intravenous On call to O.R. 04/26/20 1015 04/26/20 1634   04/26/20 1026  ceFAZolin (ANCEF) 2-4 GM/100ML-% IVPB       Note to Pharmacy: Jasmine Pang   : cabinet override      04/26/20 1026 04/26/20 2229      Discharge Exam: Blood pressure 130/74, pulse 85, temperature 98.2 F (36.8 C), temperature source Oral, resp. rate 18, height 5\' 3"  (1.6 m), weight 111.4 kg, SpO2 99 %. Neurologic: Grossly normal Dressing dry  Discharge Medications:   Allergies as of 04/27/2020      Reactions   Ciprofloxacin Hcl Other (See Comments)   Burning at IV site   Metformin And Related Other (See Comments)   CKD    Ace Inhibitors Cough   Lisinopril   Amlodipine Swelling   Lower leg swelling.    Baclofen Nausea Only, Other (See Comments)   Disoriented, dizziness, weakness, fatigue.       Medication List    STOP taking these medications   traMADol 50 MG tablet Commonly known as: ULTRAM     TAKE these medications   Accu-Chek FastClix Lancets Misc TEST BLOOD SUGAR FOUR TIMES DAILY   allopurinol 100 MG tablet Commonly known as: ZYLOPRIM TAKE 1 TABLET BY MOUTH  DAILY   aspirin EC 81 MG tablet Take 81 mg by mouth daily. Swallow whole.   B-D ULTRAFINE III SHORT PEN 31G X 8 MM Misc Generic drug: Insulin Pen Needle Use to give insulin twice a day and victoza once a day. Dx code = E11.9   carvedilol 25 MG tablet Commonly known as: COREG Take 25 mg by mouth 2 (two) times daily with a meal.   chlorthalidone 25 MG tablet Commonly known as: HYGROTON TAKE 1 TABLET BY MOUTH  DAILY   cholecalciferol 1000 units tablet Commonly known as: VITAMIN D Take 1,000 Units by mouth daily.   colchicine 0.6 MG tablet Take 2 tablets (1.2 mg total) at onset of gout flare then take 1 tablet (0.6 mg) one hour later if symptoms persist. (MAX: 1.8 mg total daily) What changed:   how much to take  how to take this  when to take this   ferrous sulfate 325 (65 FE) MG tablet Take  325 mg by mouth daily.   Lantus SoloStar 100 UNIT/ML Solostar Pen Generic drug: insulin glargine Inject 40 Units into the skin daily. Increase as instructed. Max dose 50 units. What changed: when to take this   losartan 100 MG tablet Commonly known as: COZAAR Take 1 tablet (100 mg total) by mouth daily.   multivitamin with minerals Tabs tablet Take 1 tablet by mouth daily.   OneTouch Ultra test strip Generic drug: glucose blood 1 each by Other route daily.   Oxycodone HCl 10 MG Tabs Take 1 tablet (10 mg total) by mouth every 4 (four) hours as needed for severe pain ((score 7 to 10)).   Ozempic (0.25 or 0.5 MG/DOSE) 2 MG/1.5ML Sopn Generic drug: Semaglutide(0.25 or 0.5MG /DOS) INJECT 0.5 MG INTO THE SKIN WEEKLY What changed: See the  new instructions.   polyethylene glycol powder 17 GM/SCOOP powder Commonly known as: GLYCOLAX/MIRALAX Take 17 g by mouth 2 (two) times daily as needed.   rosuvastatin 40 MG tablet Commonly known as: CRESTOR TAKE 1 TABLET BY MOUTH  DAILY   vitamin B-12 1000 MCG tablet Commonly known as: CYANOCOBALAMIN Take 1,000 mcg by mouth daily.       Disposition: home   Final Dx: PLIF L3-4  Discharge Instructions    Call MD for:  difficulty breathing, headache or visual disturbances   Complete by: As directed    Call MD for:  persistant nausea and vomiting   Complete by: As directed    Call MD for:  redness, tenderness, or signs of infection (pain, swelling, redness, odor or green/yellow discharge around incision site)   Complete by: As directed    Call MD for:  severe uncontrolled pain   Complete by: As directed    Call MD for:  temperature >100.4   Complete by: As directed    Diet - low sodium heart healthy   Complete by: As directed    Increase activity slowly   Complete by: As directed    Remove dressing in 48 hours   Complete by: As directed        Follow-up Information    Kary Kos, MD. Schedule an appointment as soon as possible  for a visit in 2 week(s).   Specialty: Neurosurgery Contact information: [address] [place] [address] [phone]                Signed: Eustace Moore 04/27/2020, 8:31 AM

## 2020-04-27 NOTE — Evaluation (Signed)
Physical Therapy Evaluation Patient Details Name: Michelle Abbott MRN: [id number] DOB: Jun 27, 1945 Today's Date: 04/27/2020   History of Present Illness  74 yo s/p decompressive laminectomy  adn PLIF L3-L5. PMH: arthritis, CKD, DM, HTN, sciatica and morbid obesity.  Clinical Impression  Patient is s/p above surgery resulting in the deficits listed below (see PT Problem List). Pt motivated and eager to return to indep. Pt with good understanding of precautions but requires verbal cues to not twist when getting into bed. Patient will benefit from skilled PT to increase their independence and safety with mobility (while adhering to their precautions) to allow discharge to the venue listed below.     Follow Up Recommendations No PT follow up;Supervision - Intermittent    Equipment Recommendations  Rolling walker with 5" wheels;3in1 (PT)    Recommendations for Other Services       Precautions / Restrictions Precautions Precautions: Back Precaution Booklet Issued: Yes (comment) Required Braces or Orthoses: Spinal Brace Spinal Brace: Lumbar corset Restrictions Weight Bearing Restrictions: No      Mobility  Bed Mobility Overal bed mobility: Needs Assistance Bed Mobility: Sidelying to Sit;Rolling Rolling: Min guard Sidelying to sit: Min guard       General bed mobility comments: vc for technique, increased time and difficulty due to body habitus, verbal cues not to reach back with L UE behind her to roll onto back    Transfers Overall transfer level: Needs assistance Equipment used: Rolling walker (2 wheeled) Transfers: Sit to/from Stand Sit to Stand: Min guard         General transfer comment: verbal cues to push up from arm rests  Ambulation/Gait Ambulation/Gait assistance: Min guard Gait Distance (Feet): 120 Feet Assistive device: Rolling walker (2 wheeled) Gait Pattern/deviations: Step-through pattern Gait velocity: slow Gait velocity interpretation: <1.31  ft/sec, indicative of household ambulator General Gait Details: increaed UE dependency on RW, verbal cues to stay inside walker and not push it too far forward  Stairs Stairs: Yes       General stair comments: pt reports having 2, 6 in platform steps, discussed placing walker up on step and stepping up with strong LE first and then down with weaker LE  Wheelchair Mobility    Modified Rankin (Stroke Patients Only)       Balance Overall balance assessment: Needs assistance   Sitting balance-Leahy Scale: Good     Standing balance support: Bilateral upper extremity supported Standing balance-Leahy Scale: Fair                               Pertinent Vitals/Pain Pain Assessment: 0-10 Pain Score: 8  Faces Pain Scale: Hurts a little bit Pain Location: back Pain Descriptors / Indicators: Aching;Discomfort Pain Intervention(s): Monitored during session    Home Living Family/patient expects to be discharged to:: Private residence Living Arrangements: Alone Available Help at Discharge: Family;Available 24 hours/day (daughter lives in same complex) Type of Home: Apartment Home Access: Stairs to enter   [employer] of Steps: 2 Home Layout: One level Home Equipment: Cane - single point;Grab bars - tub/shower;Hand held shower head      Prior Function Level of Independence: Independent with assistive device(s)         Comments: uses cane     Hand Dominance   Dominant Hand: Right    Extremity/Trunk Assessment   Upper Extremity Assessment Upper Extremity Assessment: Overall WFL for tasks assessed    Lower Extremity Assessment Lower  Extremity Assessment: Generalized weakness    Cervical / Trunk Assessment Cervical / Trunk Assessment: Other exceptions (back surgery; increased body habitus)  Communication   Communication: No difficulties  Cognition Arousal/Alertness: Awake/alert Behavior During Therapy: WFL for tasks  assessed/performed Overall Cognitive Status: Within Functional Limits for tasks assessed                                        General Comments General comments (skin integrity, edema, etc.): vss    Exercises     Assessment/Plan    PT Assessment Patient needs continued PT services  PT Problem List Decreased strength;Decreased activity tolerance;Decreased balance;Decreased mobility;Decreased knowledge of use of DME       PT Treatment Interventions DME instruction;Gait training;Stair training;Therapeutic activities;Functional mobility training;Therapeutic exercise    PT Goals (Current goals can be found in the Care Plan section)  Acute Rehab PT Goals Patient Stated Goal: to be able to take care of herself and clean her house PT Goal Formulation: With patient Time For Goal Achievement: 05/11/20 Potential to Achieve Goals: Good    Frequency Min 5X/week   Barriers to discharge        Co-evaluation               AM-PAC PT "6 Clicks" Mobility  Outcome Measure Help needed turning from your back to your side while in a flat bed without using bedrails?: None Help needed moving from lying on your back to sitting on the side of a flat bed without using bedrails?: None Help needed moving to and from a bed to a chair (including a wheelchair)?: A Little Help needed standing up from a chair using your arms (e.g., wheelchair or bedside chair)?: A Little Help needed to walk in hospital room?: A Little Help needed climbing 3-5 steps with a railing? : A Little 6 Click Score: 20    End of Session Equipment Utilized During Treatment: Back brace Activity Tolerance: Patient tolerated treatment well Patient left: in bed;with call bell/phone within reach Nurse Communication: Mobility status PT Visit Diagnosis: Unsteadiness on feet (R26.81);Muscle weakness (generalized) (M62.81);Difficulty in walking, not elsewhere classified (R26.2)    Time: 7510-2585 PT Time  Calculation (min) (ACUTE ONLY): 23 min   Charges:   PT Evaluation $PT Eval Moderate Complexity: 1 Mod PT Treatments $Gait Training: 8-22 mins        Kittie Plater, PT, DPT Acute Rehabilitation Services Pager #: [phone] Office #: [phone]   Berline Lopes 04/27/2020, 10:28 AM

## 2020-04-28 ENCOUNTER — Encounter (HOSPITAL_COMMUNITY): Payer: Self-pay | Admitting: Emergency Medicine

## 2020-04-28 ENCOUNTER — Other Ambulatory Visit: Payer: Self-pay

## 2020-04-28 ENCOUNTER — Emergency Department (HOSPITAL_COMMUNITY): Payer: Medicare Other

## 2020-04-28 ENCOUNTER — Inpatient Hospital Stay (HOSPITAL_COMMUNITY)
Admission: EM | Admit: 2020-04-28 | Discharge: 2020-05-02 | DRG: 683 | Disposition: A | Discharge status: Skilled Nursing Facility | Payer: Medicare Other | Attending: Family Medicine | Admitting: Family Medicine

## 2020-04-28 DIAGNOSIS — Z794 Long term (current) use of insulin: Secondary | ICD-10-CM

## 2020-04-28 DIAGNOSIS — D631 Anemia in chronic kidney disease: Secondary | ICD-10-CM | POA: Diagnosis present

## 2020-04-28 DIAGNOSIS — M1990 Unspecified osteoarthritis, unspecified site: Secondary | ICD-10-CM | POA: Diagnosis present

## 2020-04-28 DIAGNOSIS — R410 Disorientation, unspecified: Secondary | ICD-10-CM | POA: Diagnosis not present

## 2020-04-28 DIAGNOSIS — N179 Acute kidney failure, unspecified: Principal | ICD-10-CM | POA: Diagnosis present

## 2020-04-28 DIAGNOSIS — Z981 Arthrodesis status: Secondary | ICD-10-CM | POA: Diagnosis not present

## 2020-04-28 DIAGNOSIS — Z9841 Cataract extraction status, right eye: Secondary | ICD-10-CM

## 2020-04-28 DIAGNOSIS — Z833 Family history of diabetes mellitus: Secondary | ICD-10-CM

## 2020-04-28 DIAGNOSIS — T402X5A Adverse effect of other opioids, initial encounter: Secondary | ICD-10-CM | POA: Diagnosis present

## 2020-04-28 DIAGNOSIS — Z809 Family history of malignant neoplasm, unspecified: Secondary | ICD-10-CM

## 2020-04-28 DIAGNOSIS — Z23 Encounter for immunization: Secondary | ICD-10-CM | POA: Diagnosis not present

## 2020-04-28 DIAGNOSIS — E785 Hyperlipidemia, unspecified: Secondary | ICD-10-CM | POA: Diagnosis not present

## 2020-04-28 DIAGNOSIS — R52 Pain, unspecified: Secondary | ICD-10-CM | POA: Diagnosis not present

## 2020-04-28 DIAGNOSIS — Z9049 Acquired absence of other specified parts of digestive tract: Secondary | ICD-10-CM | POA: Diagnosis not present

## 2020-04-28 DIAGNOSIS — D62 Acute posthemorrhagic anemia: Secondary | ICD-10-CM | POA: Diagnosis present

## 2020-04-28 DIAGNOSIS — Z9842 Cataract extraction status, left eye: Secondary | ICD-10-CM | POA: Diagnosis not present

## 2020-04-28 DIAGNOSIS — R4781 Slurred speech: Secondary | ICD-10-CM | POA: Diagnosis present

## 2020-04-28 DIAGNOSIS — E1122 Type 2 diabetes mellitus with diabetic chronic kidney disease: Secondary | ICD-10-CM | POA: Diagnosis present

## 2020-04-28 DIAGNOSIS — Z888 Allergy status to other drugs, medicaments and biological substances status: Secondary | ICD-10-CM

## 2020-04-28 DIAGNOSIS — E1140 Type 2 diabetes mellitus with diabetic neuropathy, unspecified: Secondary | ICD-10-CM | POA: Diagnosis present

## 2020-04-28 DIAGNOSIS — Z87891 Personal history of nicotine dependence: Secondary | ICD-10-CM

## 2020-04-28 DIAGNOSIS — N184 Chronic kidney disease, stage 4 (severe): Secondary | ICD-10-CM | POA: Diagnosis present

## 2020-04-28 DIAGNOSIS — Z79899 Other long term (current) drug therapy: Secondary | ICD-10-CM

## 2020-04-28 DIAGNOSIS — Y929 Unspecified place or not applicable: Secondary | ICD-10-CM

## 2020-04-28 DIAGNOSIS — Z823 Family history of stroke: Secondary | ICD-10-CM

## 2020-04-28 DIAGNOSIS — Z7982 Long term (current) use of aspirin: Secondary | ICD-10-CM

## 2020-04-28 DIAGNOSIS — I129 Hypertensive chronic kidney disease with stage 1 through stage 4 chronic kidney disease, or unspecified chronic kidney disease: Secondary | ICD-10-CM | POA: Diagnosis present

## 2020-04-28 DIAGNOSIS — Z825 Family history of asthma and other chronic lower respiratory diseases: Secondary | ICD-10-CM

## 2020-04-28 DIAGNOSIS — R059 Cough, unspecified: Secondary | ICD-10-CM | POA: Diagnosis not present

## 2020-04-28 DIAGNOSIS — E860 Dehydration: Secondary | ICD-10-CM | POA: Diagnosis present

## 2020-04-28 DIAGNOSIS — R29810 Facial weakness: Secondary | ICD-10-CM | POA: Diagnosis not present

## 2020-04-28 DIAGNOSIS — Z20822 Contact with and (suspected) exposure to covid-19: Secondary | ICD-10-CM | POA: Diagnosis present

## 2020-04-28 DIAGNOSIS — M549 Dorsalgia, unspecified: Secondary | ICD-10-CM | POA: Diagnosis present

## 2020-04-28 DIAGNOSIS — Z8261 Family history of arthritis: Secondary | ICD-10-CM

## 2020-04-28 DIAGNOSIS — E1159 Type 2 diabetes mellitus with other circulatory complications: Secondary | ICD-10-CM | POA: Diagnosis present

## 2020-04-28 DIAGNOSIS — M109 Gout, unspecified: Secondary | ICD-10-CM | POA: Diagnosis not present

## 2020-04-28 DIAGNOSIS — D72829 Elevated white blood cell count, unspecified: Secondary | ICD-10-CM | POA: Diagnosis present

## 2020-04-28 DIAGNOSIS — D649 Anemia, unspecified: Secondary | ICD-10-CM | POA: Diagnosis present

## 2020-04-28 DIAGNOSIS — N189 Chronic kidney disease, unspecified: Secondary | ICD-10-CM

## 2020-04-28 DIAGNOSIS — G8918 Other acute postprocedural pain: Secondary | ICD-10-CM | POA: Diagnosis present

## 2020-04-28 DIAGNOSIS — H9190 Unspecified hearing loss, unspecified ear: Secondary | ICD-10-CM | POA: Diagnosis present

## 2020-04-28 DIAGNOSIS — R0902 Hypoxemia: Secondary | ICD-10-CM | POA: Diagnosis not present

## 2020-04-28 HISTORY — DX: Chronic kidney disease, unspecified: N17.9

## 2020-04-28 LAB — CBC
HCT: 25.4 % — ABNORMAL LOW (ref 36.0–46.0)
Hemoglobin: 7.5 g/dL — ABNORMAL LOW (ref 12.0–15.0)
MCH: 26.9 pg (ref 26.0–34.0)
MCHC: 29.5 g/dL — ABNORMAL LOW (ref 30.0–36.0)
MCV: 91 fL (ref 80.0–100.0)
Platelets: 212 10*3/uL (ref 150–400)
RBC: 2.79 MIL/uL — ABNORMAL LOW (ref 3.87–5.11)
RDW: 15.5 % (ref 11.5–15.5)
WBC: 11.5 10*3/uL — ABNORMAL HIGH (ref 4.0–10.5)
nRBC: 0 % (ref 0.0–0.2)

## 2020-04-28 LAB — URINALYSIS, ROUTINE W REFLEX MICROSCOPIC
Bilirubin Urine: NEGATIVE
Glucose, UA: 50 mg/dL — AB
Ketones, ur: NEGATIVE mg/dL
Leukocytes,Ua: NEGATIVE
Nitrite: NEGATIVE
Protein, ur: >=300 mg/dL — AB
Specific Gravity, Urine: 1.014 (ref 1.005–1.030)
pH: 5 (ref 5.0–8.0)

## 2020-04-28 LAB — BASIC METABOLIC PANEL
Anion gap: 11 (ref 5–15)
BUN: 56 mg/dL — ABNORMAL HIGH (ref 8–23)
CO2: 20 mmol/L — ABNORMAL LOW (ref 22–32)
Calcium: 8.6 mg/dL — ABNORMAL LOW (ref 8.9–10.3)
Chloride: 106 mmol/L (ref 98–111)
Creatinine, Ser: 4.03 mg/dL — ABNORMAL HIGH (ref 0.44–1.00)
GFR, Estimated: 11 mL/min — ABNORMAL LOW (ref >60)
Glucose, Bld: 185 mg/dL — ABNORMAL HIGH (ref 70–99)
Potassium: 4.3 mmol/L (ref 3.5–5.1)
Sodium: 137 mmol/L (ref 135–145)

## 2020-04-28 LAB — LACTIC ACID, PLASMA: Lactic Acid, Venous: 1.2 mmol/L (ref 0.5–1.9)

## 2020-04-28 LAB — POC OCCULT BLOOD, ED: Fecal Occult Bld: NEGATIVE

## 2020-04-28 LAB — RESP PANEL BY RT-PCR (FLU A&B, COVID) ARPGX2
Influenza A by PCR: NEGATIVE
Influenza B by PCR: NEGATIVE
SARS Coronavirus 2 by RT PCR: NEGATIVE

## 2020-04-28 MED ORDER — LACTATED RINGERS IV BOLUS
1000.0000 mL | Freq: Once | INTRAVENOUS | Status: AC
  Administered 2020-04-28: 21:00:00 1000 mL via INTRAVENOUS

## 2020-04-28 NOTE — ED Notes (Signed)
[phone] daughter cell phone  Updated on patient status

## 2020-04-28 NOTE — ED Provider Notes (Signed)
[facility] EMERGENCY DEPARTMENT Provider Note   CSN: [id number] Arrival date & time: 04/28/20  1447     History Chief Complaint  Patient presents with  . Back Pain  . Fatigue    Michelle Abbott is a 74 y.o. female.  HPI 74 year old female presents with back pain and generally not feeling well.  No known fevers but she is having some chills.  No vomiting or diarrhea.  Some cough.  No urinary symptoms.  Is having severe back pain since her surgery on 12/10.  She has chronic right leg weakness that she states is stable.  No headache.   Past Medical History:  Diagnosis Date  . Anemia   . Arthritis   . Chronic kidney disease    Stage 4  . Diabetes mellitus without complication ([place])    type 2  . Gout 2017  . Hyperlipidemia   . Hypertension   . Sciatica   . Vitamin D deficiency 2014    Patient Active Problem List   Diagnosis Date Noted  . AKI (acute kidney injury) ([place]) 04/28/2020  . Spondylolisthesis at L4-L5 level 04/26/2020  . Rectal bleeding 12/28/2019  . Encounter for screening mammogram for malignant neoplasm of breast 06/30/2019  . Age-related hearing loss 03/12/2019  . SI (sacroiliac) joint inflammation ([place]) 03/12/2019  . Need for immunization against influenza 03/12/2019  . UTI (urinary tract infection) 02/02/2019  . Sciatica 11/17/2016  . Echocardiogram shows left ventricular diastolic dysfunction 36/62/9476  . CKD (chronic kidney disease), stage III ([place]) 07/21/2016  . Type 2 diabetes mellitus with diabetic neuropathy, with long-term current use of insulin ([place]) 06/15/2016  . Gout 06/15/2016  . Essential hypertension 06/15/2016  . Anemia 06/15/2016  . Vitamin D deficiency 06/15/2016  . Osteoarthritis 06/15/2016  . Hyperlipemia 06/15/2016  . Back pain 06/15/2016    Past Surgical History:  Procedure Laterality Date  . BREAST SURGERY Right 1961   Benign lump removed  . CATARACT EXTRACTION, BILATERAL Bilateral 2017  . CESAREAN  SECTION  1985  . CESAREAN SECTION  1990  . CHOLECYSTECTOMY  1999  . COLONOSCOPY    . DILATION AND CURETTAGE OF UTERUS  2017  . KNEE ARTHROSCOPY Right   . VAGINAL SEPTOPLASTY  1966     OB History   No obstetric history on file.     Family History  Problem Relation Age of Onset  . Cancer Mother        ovarian and pancreatic  . Diabetes Mother   . Stroke Mother   . COPD Father   . Arthritis Father   . Cancer Brother        Lung  . COPD Brother     Social History   Tobacco Use  . Smoking status: Former Smoker    Packs/day: 1.50    Years: 30.00    Pack years: 45.00    Types: Cigarettes    Quit date: 05/18/1997    Years since quitting: 22.9  . Smokeless tobacco: Never Used  . Tobacco comment: 1.5 - 2 ppd for 30 years:   no plans to re-start  Vaping Use  . Vaping Use: Never used  Substance Use Topics  . Alcohol use: No  . Drug use: No    Home Medications Prior to Admission medications   Medication Sig Start Date End Date Taking? Authorizing Provider  ACCU-CHEK FASTCLIX LANCETS MISC TEST BLOOD SUGAR FOUR TIMES DAILY 08/24/17  Yes Lucila Maine C, DO  allopurinol (ZYLOPRIM) 100 MG tablet  TAKE 1 TABLET BY MOUTH  DAILY Patient taking differently: No sig reported 04/12/20  Yes Gladys Damme, MD  aspirin EC 81 MG tablet Take 81 mg by mouth daily. Swallow whole.   Yes [provider]  B-D ULTRAFINE III SHORT PEN 31G X 8 MM MISC Use to give insulin twice a day and victoza once a day. Dx code = E11.9 03/22/19  Yes Gladys Damme, MD  carvedilol (COREG) 25 MG tablet Take 25 mg by mouth 2 (two) times daily with a meal.   Yes [provider]  chlorthalidone (HYGROTON) 25 MG tablet TAKE 1 TABLET BY MOUTH  DAILY Patient taking differently: Take 25 mg by mouth daily. 08/15/19  Yes Gladys Damme, MD  cholecalciferol (VITAMIN D) 1000 units tablet Take 1,000 Units by mouth daily.   Yes [provider]  colchicine 0.6 MG tablet Take 2 tablets (1.2 mg  total) at onset of gout flare then take 1 tablet (0.6 mg) one hour later if symptoms persist. (MAX: 1.8 mg total daily) Patient taking differently: Take 0.6-1.2 mg by mouth See admin instructions. Take 2 tablets (1.2 mg total) at onset of gout flare then take 1 tablet (0.6 mg) one hour later if symptoms persist. (MAX: 1.8 mg total daily) 04/12/19  Yes Martyn Malay, MD  ferrous sulfate 325 (65 FE) MG tablet Take 325 mg by mouth daily. 06/12/16  Yes [provider]  glucose blood (ONETOUCH ULTRA) test strip 1 each by Other route daily. 07/10/19  Yes Gladys Damme, MD  LANTUS SOLOSTAR 100 UNIT/ML Solostar Pen Inject 40 Units into the skin daily. Increase as instructed. Max dose 50 units. Patient taking differently: Inject 20 Units into the skin every morning. Increase as instructed. Max dose 50 units. 09/11/19  Yes Gifford Shave, MD  losartan (COZAAR) 100 MG tablet Take 1 tablet (100 mg total) by mouth daily. Patient taking differently: Take 100 mg by mouth at bedtime. 12/10/19  Yes Gladys Damme, MD  Multiple Vitamin (MULTIVITAMIN WITH MINERALS) TABS tablet Take 1 tablet by mouth daily.   Yes [provider]  oxyCODONE 10 MG TABS Take 1 tablet (10 mg total) by mouth every 4 (four) hours as needed for severe pain ((score 7 to 10)). 04/27/20  Yes Eustace Moore, MD  OZEMPIC, 0.25 OR 0.5 MG/DOSE, 2 MG/1.5ML SOPN INJECT 0.5 MG INTO THE SKIN WEEKLY Patient taking differently: Inject 0.5 mg into the muscle every Wednesday. 04/03/20  Yes Gladys Damme, MD  polyethylene glycol powder (GLYCOLAX/MIRALAX) powder Take 17 g by mouth 2 (two) times daily as needed. Patient taking differently: Take 17 g by mouth 2 (two) times daily as needed for moderate constipation. 12/28/16  Yes Riccio, Angela C, DO  rosuvastatin (CRESTOR) 40 MG tablet TAKE 1 TABLET BY MOUTH  DAILY Patient taking differently: Take 40 mg by mouth daily. 11/10/19  Yes Gladys Damme, MD  vitamin B-12 (CYANOCOBALAMIN) 1000  MCG tablet Take 1,000 mcg by mouth daily.    Yes [provider]    Allergies    Ciprofloxacin hcl, Metformin and related, Ace inhibitors, Amlodipine, and Baclofen  Review of Systems   Review of Systems  Constitutional: Negative for fever.  Respiratory: Positive for cough. Negative for shortness of breath.   Cardiovascular: Negative for chest pain.  Gastrointestinal: Negative for abdominal pain, diarrhea and vomiting.  Genitourinary: Negative for dysuria.  Musculoskeletal: Positive for back pain.  Neurological: Positive for weakness. Negative for headaches.    Physical Exam Updated Vital Signs BP Marland Kitchen)  137/54   Pulse 93   Temp 99.8 F (37.7 C) (Rectal)   Resp 15   SpO2 100%   Physical Exam Vitals and nursing note reviewed.  Constitutional:      Appearance: She is well-developed and well-nourished. She is obese.  HENT:     Head: Normocephalic and atraumatic.     Right Ear: External ear normal.     Left Ear: External ear normal.     Nose: Nose normal.  Eyes:     General:        Right eye: No discharge.        Left eye: No discharge.  Cardiovascular:     Rate and Rhythm: Normal rate and regular rhythm.     Heart sounds: Normal heart sounds.  Pulmonary:     Effort: Pulmonary effort is normal.     Breath sounds: Normal breath sounds.  Abdominal:     General: There is no distension.     Palpations: Abdomen is soft.     Tenderness: There is no abdominal tenderness.  Musculoskeletal:     Comments: Diffuse lumbar back tenderness. No obvious skin infection at wound site  Skin:    General: Skin is warm and dry.  Neurological:     Mental Status: She is alert.     Comments: 4/5 strength in RLE, 5/5 strength in LLE.  Psychiatric:        Mood and Affect: Mood is not anxious.     ED Results / Procedures / Treatments   Labs (all labs ordered are listed, but only abnormal results are displayed) Labs Reviewed  BASIC METABOLIC PANEL - Abnormal; Notable for the  following components:      Result Value   CO2 20 (*)    Glucose, Bld 185 (*)    BUN 56 (*)    Creatinine, Ser 4.03 (*)    Calcium 8.6 (*)    GFR, Estimated 11 (*)    All other components within normal limits  CBC - Abnormal; Notable for the following components:   WBC 11.5 (*)    RBC 2.79 (*)    Hemoglobin 7.5 (*)    HCT 25.4 (*)    MCHC 29.5 (*)    All other components within normal limits  URINALYSIS, ROUTINE W REFLEX MICROSCOPIC - Abnormal; Notable for the following components:   APPearance HAZY (*)    Glucose, UA 50 (*)    Hgb urine dipstick MODERATE (*)    Protein, ur >=300 (*)    Bacteria, UA RARE (*)    All other components within normal limits  RESP PANEL BY RT-PCR (FLU A&B, COVID) [id number]  LACTIC ACID, PLASMA  LACTIC ACID, PLASMA  POC OCCULT BLOOD, ED  CBG MONITORING, ED    EKG EKG Interpretation  Date/Time:  Sunday April 28 2020 15:12:21 EST Ventricular Rate:  92 PR Interval:  162 QRS Duration: 82 QT Interval:  342 QTC Calculation: 422 R Axis:   -31 Text Interpretation: Normal sinus rhythm Left axis deviation Cannot rule out Anterior infarct , age undetermined Abnormal ECG Confirmed by Sherwood Gambler [phone]) on 04/28/2020 8:26:35 PM   Radiology DG Chest Portable 1 View  Result Date: 04/28/2020 CLINICAL DATA:  cough EXAM: PORTABLE CHEST 1 VIEW COMPARISON:  Chest radiograph 05/02/2017 FINDINGS: Stable cardiomediastinal contours with enlarged heart size. The lungs are clear. No pneumothorax or large pleural effusion. No acute finding in the visualized skeleton. IMPRESSION: No evidence of active disease in the chest. Electronically Signed  By: Audie Pinto M.D.   On: 04/28/2020 21:00    Procedures Procedures (including critical care time)  Medications Ordered in ED Medications  lactated ringers bolus 1,000 mL (1,000 mLs Intravenous New Bag/Given 04/28/20 2104)    ED Course  I have reviewed the triage vital signs and the nursing  notes.  Pertinent labs & imaging results that were available during my care of the patient were reviewed by me and considered in my medical decision making (see chart for details).    MDM Rules/Calculators/A&P                          Patient has post-op back pain. No new focal weakness but feels generally unwell. Has drop in hemoglobin but with negative occult blood in stool. Also has acute on chronic kidney injury. Will give fluids. Admit to family practice.  Final Clinical Impression(s) / ED Diagnoses Final diagnoses:  Acute kidney injury superimposed on chronic kidney disease [facility])    Rx / DC Orders ED Discharge Orders    None       Sherwood Gambler, MD 04/28/20 2349

## 2020-04-28 NOTE — H&P (Addendum)
[facility] Admission History and Physical Service Pager: [phone]  Patient name: Michelle Abbott Medical record number: [id number] Date of birth: 1945/10/15 Age: 74 y.o. Gender: female  Primary Care Provider: Gladys Damme, MD Consultants: None Code Status: Full Code  Preferred Emergency Contact:  Contact Information    Name Relation Home Work [place] Daughter [phone]  [phone]     Chief Complaint: Acute kidney injury  Assessment and Plan: Michelle Abbott is a 74 y.o. female presenting with inability to stand s/p back surgery and slow slurred speech . PMH is significant for sciatica, T2DM, CKD, gout, HTN, anemia, OA, HDL, lumbar spinal stenosis s/p PILF.  Acute kidney injury  CKD stage IV Patient presented with GFR 11 BUN 56, Cr of 4.03, on 12/8, Cr was 2.58. Baseline appears to be around 2.05 over the last year. CrCl notable at 15.1. UA notable for protein, rare bacteria, moderate hemoglobin, and glucose of 50. AKI likely pre-renal injury secondary to decreased oral intake. Can also consider chronic medications that can affect the kidneys (losartan, chlorthalidone, allopurinol). S/p 1 L LR in the ED.  - Admit to observation med-surg,  FPTS, attending Dr. Darrol Angel  - Vitals per routine - Monitor with daily RFP - Avoid nephrotoxic agents as able - Strict Is&Os - Hold chlorthalidone, losartan, allopurinol, colchicine - mIVF 123mL/hr LR - encourage PO fluid intake  Altered mentation in the setting of recent pain meds Patient and daughter report slower and slurred speech. Per daughter, patient has not been at baseline alertness since surgery on 12/10. MAR shows total of 50 mg oxycodone dosed from 12/10 16:00 -12/11 0730. Patient reports last dose of oxycodone at home was 12/12 @ 0930. Patient thinks that the oxycodone 10mg  is too strong and that she is having difficulty with thinking and speaking. Neuro exam notable for slower  speech with some difficulty following directions. Most likely cause of AMS is due to oxycodone. No focal neurologic deficits on exam and no concern for stroke. BUN and basic metabolic labs not far from patient's baseline, making uremia and metabolic causes less likely.  - Decreasing oxycodone to 2.5mg  q4h PRN, titrate as needed - Continue to monitor mentation  Back pain Lumbar spinal stenosis s/p posterior lumbar interbody fusion (PILF) Patient reports inability to stand since returning home on 12/11 after her back surgery on 12/10. Negative for red flag symptoms. Patient has baseline back pain which failed conservative treament. On 04/26/20, patient underwent posterior lumbar interbody fusion without complications.  Patient was discharged on 12/11 with oxycodone 10mg  q4h. Due to patient mentation, decreasing the dose to 2.5mg  oxycodone with room to titrate up in unable to control the pain. Patient reports that she was up walking after surgery in the hospital, but reports much more difficulty ambulating at home. - Notify neurosurgery in AM - Oxycodone 2.5mg  q4h PRN - Tylenol 650mg  q6h PRN - PT/OT eval and treat  Acute on chronic normocytic anemia Patient denies rectal/vaginal/urinary bleeding, dizziness. Hgb today 7.5, on 12/8 was 9.5. Baseline appears to be around 9-10. FOBT negative. UA notable for moderate hemoglobin, which was also present on 11/05/19. Currently takes ferrous sulfate 325 mg daily. Likely related to recent spinal surgery on 12/10 with reported EBL of 400cc. Acute bleed as patient does not report any obvious bleeding source, cannot rule out upper GI though patient otherwise asymptomatic and abdominal exam was benign.  - Monitor with CBC - Monitor for symptoms  Mild leukocytosis WBC today 11.5, on 12/8  was 5.3. Patient afebrile, dressing clean/dry/intact. Patient underwent spinal surgery on 12/10, consideration that this could be related to recent surgery. Likely normal response to  surgery. Less likely infectious in etiology due to patient afebrile with clean dressing site, and no other systemic signs of infection. - Continue to monitor with CBC - Monitor for fever - Monitor surgical wound site for signs of infection  T2DM Blood sugar 185 on BMP. A1C on 8/12 was 7.9%. UA notable for glucose of 50 and protein >300. Home medications include Lantus 40U, ozempic (on Wednesdays), rosuvastatin 40mg . Patient reportedly has not eaten since returning home on 12/11. - sSSI - CBGs TID AC and QHS - Continue home rosuvastatin  - Hold lantus due to decrease oral intake, holding ozempic  HTN Home medications include Losartan 100mg , chlorthalidone 25mg . BP on admission ranged from 136-154/51-142. Likely elevated in the setting of increased back pain and not taking medications this morning.  - Continue to monitor BP - Vitals per routine - Hold chlorthalidone, losartan   FEN/GI: renal/carb modified, mIVF 166mL/hr LR Prophylaxis: Lovenox, renal dosing  Disposition: Admit to med-surg  History of Present Illness:  Michelle Abbott is a 74 y.o. female presenting with unable to stand after recent back surgery on 12/10, slurred speech with slow mentation after pain medications. Daughter was in room to assist with history.  Patient had spinal surgery on 12/10, she reports that before she was discharged she was able to stand up and walk around on her own in the hospital. Since she arrived home, she states she cannot stand up on her legs and reports that she thinks her back brace might be too tight and is pulling and causing pain.   The patient's daughter was concerned as she has felt that the patient has had slurring and slowed speech since the surgery. She states that the patient has not been at baseline alertness since 12/10 prior to the surgery and that the patient has been "acting weird since the surgery". Patient states that she thinks the pain medications are too strong, she has not had  any of her medications today except the oxycodone 10mg  at 9:30AM. She typically eats her food and then takes her medications. Patient has not been able to eat today and thus has not taken any of her medications. Patient denies any difficulty with swallowing, just having weakness making chewing difficult.    Review Of Systems: Per HPI with the following additions:   Review of Systems  Constitutional: Negative for chills and fever.  HENT: Negative for congestion and sore throat.   Respiratory: Negative for cough, chest tightness and shortness of breath.   Cardiovascular: Negative for chest pain and palpitations.  Gastrointestinal: Negative for abdominal pain, blood in stool, diarrhea, nausea and vomiting. Constipation: last bowel movement Friday   Genitourinary: Negative for dysuria and frequency.       Denies bladder or bowel incontinence   Musculoskeletal: Positive for back pain.  Skin: Negative for rash.  Neurological: Positive for tremors (shaking more), speech difficulty (having trouble getting words out, takes longer to get her thoughts together) and weakness.  Psychiatric/Behavioral: Positive for confusion and decreased concentration.     Patient Active Problem List   Diagnosis Date Noted  . Spondylolisthesis at L4-L5 level 04/26/2020  . Rectal bleeding 12/28/2019  . Encounter for screening mammogram for malignant neoplasm of breast 06/30/2019  . Age-related hearing loss 03/12/2019  . SI (sacroiliac) joint inflammation ([place]) 03/12/2019  . Need for immunization against influenza 03/12/2019  .  UTI (urinary tract infection) 02/02/2019  . Sciatica 11/17/2016  . Echocardiogram shows left ventricular diastolic dysfunction 16/02/9603  . CKD (chronic kidney disease), stage III ([place]) 07/21/2016  . Type 2 diabetes mellitus with diabetic neuropathy, with long-term current use of insulin ([place]) 06/15/2016  . Gout 06/15/2016  . Essential hypertension 06/15/2016  . Anemia 06/15/2016  .  Vitamin D deficiency 06/15/2016  . Osteoarthritis 06/15/2016  . Hyperlipemia 06/15/2016  . Back pain 06/15/2016    Past Medical History: Past Medical History:  Diagnosis Date  . Anemia   . Arthritis   . Chronic kidney disease    Stage 4  . Diabetes mellitus without complication ([place])    type 2  . Gout 2017  . Hyperlipidemia   . Hypertension   . Sciatica   . Vitamin D deficiency 2014    Past Surgical History: Past Surgical History:  Procedure Laterality Date  . BREAST SURGERY Right 1961   Benign lump removed  . CATARACT EXTRACTION, BILATERAL Bilateral 2017  . CESAREAN SECTION  1985  . CESAREAN SECTION  1990  . CHOLECYSTECTOMY  1999  . COLONOSCOPY    . DILATION AND CURETTAGE OF UTERUS  2017  . KNEE ARTHROSCOPY Right   . VAGINAL SEPTOPLASTY  1966    Social History: Social History   Tobacco Use  . Smoking status: Former Smoker    Packs/day: 1.50    Years: 30.00    Pack years: 45.00    Types: Cigarettes    Quit date: 05/18/1997    Years since quitting: 22.9  . Smokeless tobacco: Never Used  . Tobacco comment: 1.5 - 2 ppd for 30 years:   no plans to re-start  Vaping Use  . Vaping Use: Never used  Substance Use Topics  . Alcohol use: No  . Drug use: No   Additional social history:   Please also refer to relevant sections of EMR.  Family History: Family History  Problem Relation Age of Onset  . Cancer Mother        ovarian and pancreatic  . Diabetes Mother   . Stroke Mother   . COPD Father   . Arthritis Father   . Cancer Brother        Lung  . COPD Brother     Allergies and Medications: Allergies  Allergen Reactions  . Ciprofloxacin Hcl Other (See Comments)    Burning at IV site  . Metformin And Related Other (See Comments)    CKD   . Ace Inhibitors Cough    Lisinopril  . Amlodipine Swelling    Lower leg swelling.   . Baclofen Nausea Only and Other (See Comments)    Disoriented, dizziness, weakness, fatigue.    No current  facility-administered medications on file prior to encounter.   Current Outpatient Medications on File Prior to Encounter  Medication Sig Dispense Refill  . ACCU-CHEK FASTCLIX LANCETS MISC TEST BLOOD SUGAR FOUR TIMES DAILY 408 each 11  . allopurinol (ZYLOPRIM) 100 MG tablet TAKE 1 TABLET BY MOUTH  DAILY (Patient taking differently: Take 100 mg by mouth daily.) 90 tablet 3  . aspirin EC 81 MG tablet Take 81 mg by mouth daily. Swallow whole.    . B-D ULTRAFINE III SHORT PEN 31G X 8 MM MISC Use to give insulin twice a day and victoza once a day. Dx code = E11.9 300 each 3  . carvedilol (COREG) 25 MG tablet Take 25 mg by mouth 2 (two) times daily with a meal.    .  chlorthalidone (HYGROTON) 25 MG tablet TAKE 1 TABLET BY MOUTH  DAILY (Patient taking differently: Take 25 mg by mouth daily.) 90 tablet 3  . cholecalciferol (VITAMIN D) 1000 units tablet Take 1,000 Units by mouth daily.    . colchicine 0.6 MG tablet Take 2 tablets (1.2 mg total) at onset of gout flare then take 1 tablet (0.6 mg) one hour later if symptoms persist. (MAX: 1.8 mg total daily) (Patient taking differently: Take 0.6-1.2 mg by mouth See admin instructions. Take 2 tablets (1.2 mg total) at onset of gout flare then take 1 tablet (0.6 mg) one hour later if symptoms persist. (MAX: 1.8 mg total daily)) 30 tablet 1  . ferrous sulfate 325 (65 FE) MG tablet Take 325 mg by mouth daily.    Marland Kitchen glucose blood (ONETOUCH ULTRA) test strip 1 each by Other route daily. 100 each 4  . LANTUS SOLOSTAR 100 UNIT/ML Solostar Pen Inject 40 Units into the skin daily. Increase as instructed. Max dose 50 units. (Patient taking differently: Inject 40 Units into the skin every morning. Increase as instructed. Max dose 50 units.) 15 pen 5  . losartan (COZAAR) 100 MG tablet Take 1 tablet (100 mg total) by mouth daily. 90 tablet 3  . Multiple Vitamin (MULTIVITAMIN WITH MINERALS) TABS tablet Take 1 tablet by mouth daily.    Marland Kitchen oxyCODONE 10 MG TABS Take 1 tablet (10 mg  total) by mouth every 4 (four) hours as needed for severe pain ((score 7 to 10)). 30 tablet 0  . OZEMPIC, 0.25 OR 0.5 MG/DOSE, 2 MG/1.5ML SOPN INJECT 0.5 MG INTO THE SKIN WEEKLY (Patient taking differently: Inject 0.5 mg into the muscle every Wednesday.) 4.5 mL 3  . polyethylene glycol powder (GLYCOLAX/MIRALAX) powder Take 17 g by mouth 2 (two) times daily as needed. 3350 g 1  . rosuvastatin (CRESTOR) 40 MG tablet TAKE 1 TABLET BY MOUTH  DAILY (Patient taking differently: Take 40 mg by mouth daily.) 90 tablet 3  . vitamin B-12 (CYANOCOBALAMIN) 1000 MCG tablet Take 1,000 mcg by mouth daily.       Objective: BP (!) 136/51   Pulse 90   Temp 99.8 F (37.7 C) (Rectal)   Resp 13   SpO2 100%  Exam: General: supine in bed, mild shaking, appears in discomfort but not distressed Eyes: PERRLA, no scleral icterus ENTM: no erythema of oropharynx Neck: supple, full ROM Cardiovascular: RRR, HR around 100, no m/r/g appreciated Respiratory: difficulty to auscultated due to habitus, normal WOB Gastrointestinal: soft, non-tender, obese MSK: no obvious muscle atrophy present Derm: dressing on lumbar spine clean/dry/intact Neuro:  - CN II: PERRL - CN III, IV,VI: EOMI (patient had difficulty with following instructions) - CV V: Abnormal sensation in V1, V2, V3 (patient reported feeling touch equally when only one side was being tested) - CVII: Symmetric smile and brow raise - CN VIII: Normal hearing - CN IX,X: Symmetric palate raise  - CN XI: 5/5 shoulder shrug - CN XII: Symmetric tongue protrusion  - UE and LE strength 5/5 - Mildly difficulty to understand speech, unsure if baseline Psych: congruent mood and affect  Labs and Imaging: CBC BMET  Recent Labs  Lab 04/28/20 1508  WBC 11.5*  HGB 7.5*  HCT 25.4*  PLT 212   Recent Labs  Lab 04/28/20 1508  NA 137  K 4.3  CL 106  CO2 20*  BUN 56*  CREATININE 4.03*  GLUCOSE 185*  CALCIUM 8.6*      DG Chest Portable 1 View  Result Date:  04/28/2020 CLINICAL DATA:  cough EXAM: PORTABLE CHEST 1 VIEW COMPARISON:  Chest radiograph 05/02/2017 FINDINGS: Stable cardiomediastinal contours with enlarged heart size. The lungs are clear. No pneumothorax or large pleural effusion. No acute finding in the visualized skeleton. IMPRESSION: No evidence of active disease in the chest. Electronically Signed   By: Audie Pinto M.D.   On: 04/28/2020 21:00     Rise Patience, DO 04/28/2020, 10:18 PM PGY-1, [place] Intern pager: [phone], text pages welcome   FPTS Upper-Level Resident Addendum I have independently interviewed and examined the patient. I have discussed the above with the original author and agree with their documentation. My edits for correction/addition/clarification are in - dark green. Please see also any attending notes.  [facility] Service pager: [phone] (text pages welcome through [facility])  Wilber Oliphant, M.D.  PGY-3 04/29/2020 12:59 AM

## 2020-04-28 NOTE — ED Notes (Signed)
Chief Complaint  Patient presents with  . Back Pain  . Fatigue   Patient resting in bed at this time, complaints of pain that she states is chronic.   BP (!) 154/142   Pulse 88   Temp 99.8 F (37.7 C) (Rectal)   Resp 15   SpO2 98%

## 2020-04-28 NOTE — ED Triage Notes (Signed)
Pt to triage via [facility] from home.  Pt discharged from hospital yesterday after having back surgery.  Family states pt seemed disoriented yesterday and went home and went to bed.  When she got up this morning she reported generalized weakness and increased pain.  No pain while sitting still.  Pt alert and oriented a little slow to answer questions.

## 2020-04-28 NOTE — ED Notes (Signed)
Please contact patient's daughter with status. Michelle Abbott [phone]

## 2020-04-29 ENCOUNTER — Observation Stay (HOSPITAL_COMMUNITY): Payer: Medicare Other

## 2020-04-29 ENCOUNTER — Encounter (HOSPITAL_COMMUNITY): Payer: Self-pay | Admitting: Family Medicine

## 2020-04-29 DIAGNOSIS — E860 Dehydration: Secondary | ICD-10-CM | POA: Diagnosis present

## 2020-04-29 DIAGNOSIS — I10 Essential (primary) hypertension: Secondary | ICD-10-CM | POA: Diagnosis not present

## 2020-04-29 DIAGNOSIS — R262 Difficulty in walking, not elsewhere classified: Secondary | ICD-10-CM | POA: Diagnosis not present

## 2020-04-29 DIAGNOSIS — I129 Hypertensive chronic kidney disease with stage 1 through stage 4 chronic kidney disease, or unspecified chronic kidney disease: Secondary | ICD-10-CM | POA: Diagnosis present

## 2020-04-29 DIAGNOSIS — Z9842 Cataract extraction status, left eye: Secondary | ICD-10-CM | POA: Diagnosis not present

## 2020-04-29 DIAGNOSIS — Z7401 Bed confinement status: Secondary | ICD-10-CM | POA: Diagnosis not present

## 2020-04-29 DIAGNOSIS — M6281 Muscle weakness (generalized): Secondary | ICD-10-CM | POA: Diagnosis not present

## 2020-04-29 DIAGNOSIS — Y929 Unspecified place or not applicable: Secondary | ICD-10-CM | POA: Diagnosis not present

## 2020-04-29 DIAGNOSIS — N189 Chronic kidney disease, unspecified: Secondary | ICD-10-CM

## 2020-04-29 DIAGNOSIS — R531 Weakness: Secondary | ICD-10-CM | POA: Diagnosis not present

## 2020-04-29 DIAGNOSIS — D649 Anemia, unspecified: Secondary | ICD-10-CM | POA: Diagnosis not present

## 2020-04-29 DIAGNOSIS — N179 Acute kidney failure, unspecified: Secondary | ICD-10-CM | POA: Diagnosis not present

## 2020-04-29 DIAGNOSIS — G8918 Other acute postprocedural pain: Secondary | ICD-10-CM | POA: Diagnosis present

## 2020-04-29 DIAGNOSIS — M549 Dorsalgia, unspecified: Secondary | ICD-10-CM | POA: Diagnosis present

## 2020-04-29 DIAGNOSIS — D62 Acute posthemorrhagic anemia: Secondary | ICD-10-CM | POA: Diagnosis present

## 2020-04-29 DIAGNOSIS — Z9841 Cataract extraction status, right eye: Secondary | ICD-10-CM | POA: Diagnosis not present

## 2020-04-29 DIAGNOSIS — R410 Disorientation, unspecified: Secondary | ICD-10-CM | POA: Diagnosis not present

## 2020-04-29 DIAGNOSIS — Z981 Arthrodesis status: Secondary | ICD-10-CM | POA: Diagnosis not present

## 2020-04-29 DIAGNOSIS — Z20822 Contact with and (suspected) exposure to covid-19: Secondary | ICD-10-CM | POA: Diagnosis present

## 2020-04-29 DIAGNOSIS — E559 Vitamin D deficiency, unspecified: Secondary | ICD-10-CM | POA: Diagnosis not present

## 2020-04-29 DIAGNOSIS — R710 Precipitous drop in hematocrit: Secondary | ICD-10-CM | POA: Diagnosis not present

## 2020-04-29 DIAGNOSIS — M4326 Fusion of spine, lumbar region: Secondary | ICD-10-CM | POA: Diagnosis not present

## 2020-04-29 DIAGNOSIS — N184 Chronic kidney disease, stage 4 (severe): Secondary | ICD-10-CM

## 2020-04-29 DIAGNOSIS — R278 Other lack of coordination: Secondary | ICD-10-CM | POA: Diagnosis not present

## 2020-04-29 DIAGNOSIS — R2689 Other abnormalities of gait and mobility: Secondary | ICD-10-CM | POA: Diagnosis not present

## 2020-04-29 DIAGNOSIS — E1140 Type 2 diabetes mellitus with diabetic neuropathy, unspecified: Secondary | ICD-10-CM | POA: Diagnosis not present

## 2020-04-29 DIAGNOSIS — R4781 Slurred speech: Secondary | ICD-10-CM | POA: Diagnosis present

## 2020-04-29 DIAGNOSIS — T402X5A Adverse effect of other opioids, initial encounter: Secondary | ICD-10-CM | POA: Diagnosis present

## 2020-04-29 DIAGNOSIS — M109 Gout, unspecified: Secondary | ICD-10-CM | POA: Diagnosis not present

## 2020-04-29 DIAGNOSIS — H9110 Presbycusis, unspecified ear: Secondary | ICD-10-CM | POA: Diagnosis not present

## 2020-04-29 DIAGNOSIS — Z743 Need for continuous supervision: Secondary | ICD-10-CM | POA: Diagnosis not present

## 2020-04-29 DIAGNOSIS — M2550 Pain in unspecified joint: Secondary | ICD-10-CM | POA: Diagnosis not present

## 2020-04-29 DIAGNOSIS — M545 Low back pain, unspecified: Secondary | ICD-10-CM | POA: Diagnosis not present

## 2020-04-29 DIAGNOSIS — E1122 Type 2 diabetes mellitus with diabetic chronic kidney disease: Secondary | ICD-10-CM | POA: Diagnosis present

## 2020-04-29 DIAGNOSIS — M1990 Unspecified osteoarthritis, unspecified site: Secondary | ICD-10-CM | POA: Diagnosis not present

## 2020-04-29 DIAGNOSIS — Z87891 Personal history of nicotine dependence: Secondary | ICD-10-CM | POA: Diagnosis not present

## 2020-04-29 DIAGNOSIS — E785 Hyperlipidemia, unspecified: Secondary | ICD-10-CM | POA: Diagnosis not present

## 2020-04-29 DIAGNOSIS — Z9049 Acquired absence of other specified parts of digestive tract: Secondary | ICD-10-CM | POA: Diagnosis not present

## 2020-04-29 DIAGNOSIS — Z833 Family history of diabetes mellitus: Secondary | ICD-10-CM | POA: Diagnosis not present

## 2020-04-29 DIAGNOSIS — Z23 Encounter for immunization: Secondary | ICD-10-CM | POA: Diagnosis not present

## 2020-04-29 DIAGNOSIS — D631 Anemia in chronic kidney disease: Secondary | ICD-10-CM | POA: Diagnosis present

## 2020-04-29 DIAGNOSIS — R6889 Other general symptoms and signs: Secondary | ICD-10-CM | POA: Diagnosis not present

## 2020-04-29 DIAGNOSIS — D72829 Elevated white blood cell count, unspecified: Secondary | ICD-10-CM | POA: Diagnosis present

## 2020-04-29 DIAGNOSIS — Z809 Family history of malignant neoplasm, unspecified: Secondary | ICD-10-CM | POA: Diagnosis not present

## 2020-04-29 DIAGNOSIS — Z794 Long term (current) use of insulin: Secondary | ICD-10-CM

## 2020-04-29 HISTORY — DX: Other acute postprocedural pain: G89.18

## 2020-04-29 LAB — CBG MONITORING, ED
Glucose-Capillary: 109 mg/dL — ABNORMAL HIGH (ref 70–99)
Glucose-Capillary: 112 mg/dL — ABNORMAL HIGH (ref 70–99)
Glucose-Capillary: 120 mg/dL — ABNORMAL HIGH (ref 70–99)
Glucose-Capillary: 136 mg/dL — ABNORMAL HIGH (ref 70–99)

## 2020-04-29 LAB — RENAL FUNCTION PANEL
Albumin: 2.3 g/dL — ABNORMAL LOW (ref 3.5–5.0)
Anion gap: 10 (ref 5–15)
BUN: 56 mg/dL — ABNORMAL HIGH (ref 8–23)
CO2: 21 mmol/L — ABNORMAL LOW (ref 22–32)
Calcium: 8.5 mg/dL — ABNORMAL LOW (ref 8.9–10.3)
Chloride: 106 mmol/L (ref 98–111)
Creatinine, Ser: 3.54 mg/dL — ABNORMAL HIGH (ref 0.44–1.00)
GFR, Estimated: 13 mL/min — ABNORMAL LOW (ref >60)
Glucose, Bld: 122 mg/dL — ABNORMAL HIGH (ref 70–99)
Phosphorus: 3.5 mg/dL (ref 2.5–4.6)
Potassium: 3.9 mmol/L (ref 3.5–5.1)
Sodium: 137 mmol/L (ref 135–145)

## 2020-04-29 LAB — CBC
HCT: 22.1 % — ABNORMAL LOW (ref 36.0–46.0)
HCT: 23.1 % — ABNORMAL LOW (ref 36.0–46.0)
Hemoglobin: 6.7 g/dL — CL (ref 12.0–15.0)
Hemoglobin: 7 g/dL — ABNORMAL LOW (ref 12.0–15.0)
MCH: 26.8 pg (ref 26.0–34.0)
MCH: 26.8 pg (ref 26.0–34.0)
MCHC: 30.3 g/dL (ref 30.0–36.0)
MCHC: 30.3 g/dL (ref 30.0–36.0)
MCV: 88.4 fL (ref 80.0–100.0)
MCV: 88.5 fL (ref 80.0–100.0)
Platelets: 183 10*3/uL (ref 150–400)
Platelets: 172 10*3/uL (ref 150–400)
RBC: 2.5 MIL/uL — ABNORMAL LOW (ref 3.87–5.11)
RBC: 2.61 MIL/uL — ABNORMAL LOW (ref 3.87–5.11)
RDW: 15.4 % (ref 11.5–15.5)
RDW: 15.3 % (ref 11.5–15.5)
WBC: 8.7 10*3/uL (ref 4.0–10.5)
WBC: 8.9 10*3/uL (ref 4.0–10.5)
nRBC: 0 % (ref 0.0–0.2)
nRBC: 0 % (ref 0.0–0.2)

## 2020-04-29 LAB — RETICULOCYTES
Immature Retic Fract: 11.7 % (ref 2.3–15.9)
RBC.: 2.23 MIL/uL — ABNORMAL LOW (ref 3.87–5.11)
Retic Count, Absolute: 35.5 10*3/uL (ref 19.0–186.0)
Retic Ct Pct: 1.6 % (ref 0.4–3.1)

## 2020-04-29 LAB — IRON AND TIBC
Iron: 25 ug/dL — ABNORMAL LOW (ref 28–170)
Saturation Ratios: 18 % (ref 10.4–31.8)
TIBC: 143 ug/dL — ABNORMAL LOW (ref 250–450)
UIBC: 118 ug/dL

## 2020-04-29 LAB — HEMOGLOBIN AND HEMATOCRIT, BLOOD
HCT: 19.2 % — ABNORMAL LOW (ref 36.0–46.0)
Hemoglobin: 5.8 g/dL — CL (ref 12.0–15.0)

## 2020-04-29 LAB — VITAMIN B12: Vitamin B-12: 838 pg/mL (ref 180–914)

## 2020-04-29 LAB — FERRITIN: Ferritin: 954 ng/mL — ABNORMAL HIGH (ref 11–307)

## 2020-04-29 MED ORDER — LACTATED RINGERS IV SOLN: INTRAVENOUS | Status: DC

## 2020-04-29 MED ORDER — INSULIN ASPART 100 UNIT/ML ~~LOC~~ SOLN
0.0000 [IU] | Freq: Three times a day (TID) | SUBCUTANEOUS | Status: DC
  Administered 2020-04-29: 12:00:00 1 [IU] via SUBCUTANEOUS

## 2020-04-29 MED ORDER — ENOXAPARIN SODIUM 30 MG/0.3ML ~~LOC~~ SOLN
30.0000 mg | SUBCUTANEOUS | Status: DC
  Administered 2020-04-29: 10:00:00 30 mg via SUBCUTANEOUS
  Filled 2020-04-29: qty 0.3

## 2020-04-29 MED ORDER — ACETAMINOPHEN 650 MG RE SUPP: 650.0000 mg | Freq: Four times a day (QID) | RECTAL | Status: DC | PRN

## 2020-04-29 MED ORDER — SODIUM CHLORIDE 0.9% IV SOLUTION: Freq: Once | INTRAVENOUS | Status: DC

## 2020-04-29 MED ORDER — CARVEDILOL 25 MG PO TABS
25.0000 mg | ORAL_TABLET | Freq: Two times a day (BID) | ORAL | Status: DC
  Administered 2020-04-29 – 2020-05-02 (×7): 25 mg via ORAL
  Filled 2020-04-29 (×4): qty 1
  Filled 2020-04-29: qty 2
  Filled 2020-04-29: qty 1
  Filled 2020-04-29: qty 2

## 2020-04-29 MED ORDER — OXYCODONE HCL 5 MG PO TABS
2.5000 mg | ORAL_TABLET | ORAL | Status: DC | PRN
  Administered 2020-05-02: 2.5 mg via ORAL
  Filled 2020-04-29: qty 1

## 2020-04-29 MED ORDER — ROSUVASTATIN CALCIUM 20 MG PO TABS
40.0000 mg | ORAL_TABLET | Freq: Every day | ORAL | Status: DC
  Administered 2020-04-29 – 2020-05-01 (×3): 40 mg via ORAL
  Filled 2020-04-29 (×3): qty 2

## 2020-04-29 MED ORDER — ACETAMINOPHEN 325 MG PO TABS: 650.0000 mg | ORAL_TABLET | Freq: Four times a day (QID) | ORAL | Status: DC | PRN

## 2020-04-29 MED ORDER — FERROUS SULFATE 325 (65 FE) MG PO TABS
325.0000 mg | ORAL_TABLET | Freq: Every day | ORAL | Status: DC
  Administered 2020-04-29 – 2020-05-02 (×4): 325 mg via ORAL
  Filled 2020-04-29 (×4): qty 1

## 2020-04-29 MED ORDER — ACETAMINOPHEN 325 MG PO TABS
650.0000 mg | ORAL_TABLET | Freq: Four times a day (QID) | ORAL | Status: DC
  Administered 2020-04-29 – 2020-05-02 (×14): 650 mg via ORAL
  Filled 2020-04-29 (×14): qty 2

## 2020-04-29 MED ORDER — ACETAMINOPHEN 650 MG RE SUPP
650.0000 mg | Freq: Four times a day (QID) | RECTAL | Status: DC
  Filled 2020-04-29 (×2): qty 1

## 2020-04-29 NOTE — Hospital Course (Addendum)
Michelle Abbott is a 74 y.o. female who presented with inability to stand s/p back surgery (12/10) and slow, slurred speech in the setting of opioid medication. Additionally found to have an AKI on admission labs. PMH is significant for sciatica, T2DM, CKD, gout, HTN, anemia, OA, HDL, lumbar spinal stenosis s/p PILF. Below is her hospital course listed by problem.   Acute kidney injury on CKD stage IV Presumed to be secondary to dehydration and decreased p.o. intake. Cr 4.03, BUN 56, GFR 11 on admission. Started on lactated Ringer's.  BMP was trended and improved to 2.49 on discharge. Of note, patients pre-surgery creatinine was 2.58 on 04/24/20.   Slurred Speech in the setting of opioids  Lumbar spinal stenosis s/p posterior lumbar interbody fusion (PILF) Patient presented with slow and slurred speech in the setting of recent opioid intake from surgery.  Neuro exam generally unremarkable and CT head negative for acute findings.  Opioid pain regimen was decreased from 10 mg Oxy to 2.5 mg.  Standing Tylenol was ordered.  Neurosurgery consulted and recommended CT lumbar spine which was not suggestive of complications from surgery.  Evaluated by physical therapy and Occupational Therapy who recommended SNF placement for patient. Slurred speech self resolved.   Acute on chronic normocytic anemia Presurgery Hgb of 9.5 on 12/8.  Hgb 7.5 on admission, 2 days after surgery.  Patient required 1U PRBC due to drop to Hgb 5.8 on 12/13. Hgb improved to 7.8 s/p transfusion.  Iron studies drawn and notable for elevated ferritin to 954 but thought to be likely related to her recent surgery. FOBT negative on admission. No concern for active bleed as hgb remained stable. D/c with Hgb 7.6  Hypertension Due to AKI, patients home losartan and chlorthalidone were held. She was continued on her home carvedilol 25 mg BID. Due to consistently elevated BP's to the 119'J systolic, patient was started on Amlodipine 5 mg BID.   Patient was discharged back on her home medications.   Other problems chronic and stable.   DC recommendations:  Repeat CBC (for anemia) Repeat BMP (d/t AKI) Consider referral to GI if persistent drop in Hb. Pt has history of GI bleed.  Pain management with tylenol on d/c Recheck BP. Held anti-hypertensives in hospital, restarting on discharge but reduced Losartan 50mg  due to AKI

## 2020-04-29 NOTE — Consult Note (Signed)
Reason for Consult: Confusion and inability to walk Referring Physician: [facility]  Michelle Abbott is an 74 y.o. female.  HPI: 74 year old female status post lumbar fusion discharged Friday started having increasing back pain difficulty walking over the next 24 to 48 hours presented the emergency room with acute on chronic renal failure confusion question of being overmedicated versus increased lower extremity weakness and difficulty walking.  Currently the patient denies any new numbness and tingling in her legs does feel little more pain down her right leg  Past Medical History:  Diagnosis Date  . Anemia   . Arthritis   . Chronic kidney disease    Stage 4  . Diabetes mellitus without complication ([place])    type 2  . Gout 2017  . Hyperlipidemia   . Hypertension   . Postoperative pain 04/29/2020  . Sciatica   . SI (sacroiliac) joint inflammation ([place]) 03/12/2019  . UTI (urinary tract infection) 02/02/2019  . Vitamin D deficiency 2014    Past Surgical History:  Procedure Laterality Date  . BREAST SURGERY Right 1961   Benign lump removed  . CATARACT EXTRACTION, BILATERAL Bilateral 2017  . CESAREAN SECTION  1985  . CESAREAN SECTION  1990  . CHOLECYSTECTOMY  1999  . COLONOSCOPY    . DILATION AND CURETTAGE OF UTERUS  2017  . KNEE ARTHROSCOPY Right   . VAGINAL SEPTOPLASTY  1966    Family History  Problem Relation Age of Onset  . Cancer Mother        ovarian and pancreatic  . Diabetes Mother   . Stroke Mother   . COPD Father   . Arthritis Father   . Cancer Brother        Lung  . COPD Brother     Social History:  reports that she quit smoking about 22 years ago. Her smoking use included cigarettes. She has a 45.00 pack-year smoking history. She has never used smokeless tobacco. She reports that she does not drink alcohol and does not use drugs.  Allergies:  Allergies  Allergen Reactions  . Ciprofloxacin Hcl Other (See Comments)    Burning at IV site  .  Metformin And Related Other (See Comments)    CKD   . Ace Inhibitors Cough    Lisinopril  . Amlodipine Swelling    Lower leg swelling.   . Baclofen Nausea Only and Other (See Comments)    Disoriented, dizziness, weakness, fatigue.     Medications: I have reviewed the patient's current medications.  Results for orders placed or performed during the hospital encounter of 04/28/20 (from the past 48 hour(s))  Basic metabolic panel     Status: Abnormal   Collection Time: 04/28/20  3:08 PM  Result Value Ref Range   Sodium 137 135 - 145 mmol/L   Potassium 4.3 3.5 - 5.1 mmol/L   Chloride 106 98 - 111 mmol/L   CO2 20 (L) 22 - 32 mmol/L   Glucose, Bld 185 (H) 70 - 99 mg/dL    Comment: Glucose reference range applies only to samples taken after fasting for at least 8 hours.   BUN 56 (H) 8 - 23 mg/dL   Creatinine, Ser 4.03 (H) 0.44 - 1.00 mg/dL   Calcium 8.6 (L) 8.9 - 10.3 mg/dL   GFR, Estimated 11 (L) >60 mL/min    Comment: (NOTE) Calculated using the CKD-EPI Creatinine Equation (2021)    Anion gap 11 5 - 15    Comment: Performed at [facility],  [address] [place], [place] [address]  CBC     Status: Abnormal   Collection Time: 04/28/20  3:08 PM  Result Value Ref Range   WBC 11.5 (H) 4.0 - 10.5 K/uL   RBC 2.79 (L) 3.87 - 5.11 MIL/uL   Hemoglobin 7.5 (L) 12.0 - 15.0 g/dL   HCT 25.4 (L) 36.0 - 46.0 %   MCV 91.0 80.0 - 100.0 fL   MCH 26.9 26.0 - 34.0 pg   MCHC 29.5 (L) 30.0 - 36.0 g/dL   RDW 15.5 11.5 - 15.5 %   Platelets 212 150 - 400 K/uL   nRBC 0.0 0.0 - 0.2 %    Comment: Performed at [facility], [place] [address] [place], [place] [address]  POC occult blood, ED     Status: None   Collection Time: 04/28/20  8:48 PM  Result Value Ref Range   Fecal Occult Bld NEGATIVE NEGATIVE  Resp Panel by RT-PCR (Flu A&B, Covid) Nasopharyngeal Swab     Status: None   Collection Time: 04/28/20  9:04 PM   Specimen: Nasopharyngeal Swab; Nasopharyngeal(NP) swabs in vial transport  medium  Result Value Ref Range   SARS Coronavirus 2 by RT PCR NEGATIVE NEGATIVE    Comment: (NOTE) SARS-CoV-2 target nucleic acids are NOT DETECTED.  The SARS-CoV-2 RNA is generally detectable in upper respiratory specimens during the acute phase of infection. The lowest concentration of SARS-CoV-2 viral copies this assay can detect is 138 copies/mL. A negative result does not preclude SARS-Cov-2 infection and should not be used as the sole basis for treatment or other patient management decisions. A negative result may occur with  improper specimen collection/handling, submission of specimen other than nasopharyngeal swab, presence of viral mutation(s) within the areas targeted by this assay, and inadequate number of viral copies(<138 copies/mL). A negative result must be combined with clinical observations, patient history, and epidemiological information. The expected result is Negative.  Fact Sheet for Patients:  EntrepreneurPulse.com.au  Fact Sheet for Healthcare Providers:  IncredibleEmployment.be  This test is no t yet approved or cleared by the [place] FDA and  has been authorized for detection and/or diagnosis of SARS-CoV-2 by FDA under an Emergency Use Authorization (EUA). This EUA will remain  in effect (meaning this test can be used) for the duration of the COVID-19 declaration under Section 564(b)(1) of the Act, 21 U.S.C.section 360bbb-3(b)(1), unless the authorization is terminated  or revoked sooner.       Influenza A by PCR NEGATIVE NEGATIVE   Influenza B by PCR NEGATIVE NEGATIVE    Comment: (NOTE) The Xpert Xpress SARS-CoV-2/FLU/RSV plus assay is intended as an aid in the diagnosis of influenza from Nasopharyngeal swab specimens and should not be used as a sole basis for treatment. Nasal washings and aspirates are unacceptable for Xpert Xpress SARS-CoV-2/FLU/RSV testing.  Fact Sheet for  Patients: EntrepreneurPulse.com.au  Fact Sheet for Healthcare Providers: IncredibleEmployment.be  This test is not yet approved or cleared by the [place] FDA and has been authorized for detection and/or diagnosis of SARS-CoV-2 by FDA under an Emergency Use Authorization (EUA). This EUA will remain in effect (meaning this test can be used) for the duration of the COVID-19 declaration under Section 564(b)(1) of the Act, 21 U.S.C. section 360bbb-3(b)(1), unless the authorization is terminated or revoked.  Performed at [facility], [place] [address] [place], [place] [address]   Lactic acid, plasma     Status: None   Collection Time: 04/28/20  9:12 PM  Result Value Ref Range   Lactic Acid, Venous 1.2 0.5 - 1.9 mmol/L    Comment: Performed at [facility] [address], [place], [place] [address]  Urinalysis, Routine w reflex microscopic Urine, Random     Status: Abnormal   Collection Time: 04/28/20  9:24 PM  Result Value Ref Range   Color, Urine YELLOW YELLOW   APPearance HAZY (A) CLEAR   Specific Gravity, Urine 1.014 1.005 - 1.030   pH 5.0 5.0 - 8.0   Glucose, UA 50 (A) NEGATIVE mg/dL   Hgb urine dipstick MODERATE (A) NEGATIVE   Bilirubin Urine NEGATIVE NEGATIVE   Ketones, ur NEGATIVE NEGATIVE mg/dL   Protein, ur >=300 (A) NEGATIVE mg/dL   Nitrite NEGATIVE NEGATIVE   Leukocytes,Ua NEGATIVE NEGATIVE   RBC / HPF 0-5 0 - 5 RBC/hpf   WBC, UA 0-5 0 - 5 WBC/hpf   Bacteria, UA RARE (A) NONE SEEN   Squamous Epithelial / LPF 0-5 0 - 5    Comment: Performed at [facility], [place] [address], [place], [place] [address]  CBC     Status: Abnormal   Collection Time: 04/29/20  2:34 AM  Result Value Ref Range   WBC 8.7 4.0 - 10.5 K/uL   RBC 2.50 (L) 3.87 - 5.11 MIL/uL   Hemoglobin 6.7 (LL) 12.0 - 15.0 g/dL    Comment: REPEATED TO VERIFY THIS CRITICAL RESULT HAS VERIFIED AND BEEN CALLED TO A.OAKLEY,RN BY MELISSA BROGDON ON 12 13  2021 AT 0320, AND HAS BEEN READ BACK.     HCT 22.1 (L) 36.0 - 46.0 %   MCV 88.4 80.0 - 100.0 fL   MCH 26.8 26.0 - 34.0 pg   MCHC 30.3 30.0 - 36.0 g/dL   RDW 15.4 11.5 - 15.5 %   Platelets 183 150 - 400 K/uL   nRBC 0.0 0.0 - 0.2 %    Comment: Performed at [facility], [place] [address] [place], [place] [address]  Renal function panel     Status: Abnormal   Collection Time: 04/29/20  2:34 AM  Result Value Ref Range   Sodium 137 135 - 145 mmol/L   Potassium 3.9 3.5 - 5.1 mmol/L   Chloride 106 98 - 111 mmol/L   CO2 21 (L) 22 - 32 mmol/L   Glucose, Bld 122 (H) 70 - 99 mg/dL    Comment: Glucose reference range applies only to samples taken after fasting for at least 8 hours.   BUN 56 (H) 8 - 23 mg/dL   Creatinine, Ser 3.54 (H) 0.44 - 1.00 mg/dL   Calcium 8.5 (L) 8.9 - 10.3 mg/dL   Phosphorus 3.5 2.5 - 4.6 mg/dL   Albumin 2.3 (L) 3.5 - 5.0 g/dL   GFR, Estimated 13 (L) >60 mL/min    Comment: (NOTE) Calculated using the CKD-EPI Creatinine Equation (2021)    Anion gap 10 5 - 15    Comment: Performed at [facility] [address], [place], [place] [address]  CBC     Status: Abnormal   Collection Time: 04/29/20  6:50 AM  Result Value Ref Range   WBC 8.9 4.0 - 10.5 K/uL   RBC 2.61 (L) 3.87 - 5.11 MIL/uL   Hemoglobin 7.0 (L) 12.0 - 15.0 g/dL   HCT 23.1 (L) 36.0 - 46.0 %   MCV 88.5 80.0 - 100.0 fL   MCH 26.8 26.0 - 34.0 pg   MCHC 30.3 30.0 - 36.0  g/dL   RDW 15.3 11.5 - 15.5 %   Platelets 172 150 - 400 K/uL   nRBC 0.0 0.0 - 0.2 %    Comment: Performed at [facility], [place] [address] [place], [place] [address]  CBG monitoring, ED     Status: Abnormal   Collection Time: 04/29/20  7:56 AM  Result Value Ref Range   Glucose-Capillary 109 (H) 70 - 99 mg/dL    Comment: Glucose reference range applies only to samples taken after fasting for at least 8 hours.   Comment 1 Notify RN    Comment 2 Document in Chart     DG Chest Portable 1 View  Result Date:  04/28/2020 CLINICAL DATA:  cough EXAM: PORTABLE CHEST 1 VIEW COMPARISON:  Chest radiograph 05/02/2017 FINDINGS: Stable cardiomediastinal contours with enlarged heart size. The lungs are clear. No pneumothorax or large pleural effusion. No acute finding in the visualized skeleton. IMPRESSION: No evidence of active disease in the chest. Electronically Signed   By: Audie Pinto M.D.   On: 04/28/2020 21:00    Review of Systems  Neurological: Positive for weakness.   Blood pressure (!) 152/53, pulse 93, temperature 99.4 F (37.4 C), resp. rate 15, SpO2 93 %. Physical Exam Neurological:     Comments: Patient is somnolent but arousable slightly confused strength appears to be 5 out of 5 in her lower extremities bilaterally except for maybe a little bit of pain limited weakness iliopsoas right leg vision clean dry and intact     Assessment/Plan: 74 year old female status post lumbar fusion increased back pain slightly increased right leg pain also potentially confusion related to overmedication.  Patient does have acute on chronic renal failure being seen by internal medicine.  Appreciate their help.  Will order CT scan of her lumbar spine for evaluation do not think that this is related to definitive complication from her surgery versus potentially medication related and chronic disease related.  We will check the CT scan to ensure stability of the construct.  Elaina Hoops 04/29/2020, 10:22 AM

## 2020-04-29 NOTE — ED Notes (Signed)
Patient Hemoglobin 5.8. provider made aware. Will continue to monitor the patient at this time.

## 2020-04-29 NOTE — Progress Notes (Signed)
Patient's hemoglobin decreased to 6.7, down from 7.5 about 12 hours ago. In that time the patient has received 1L bolus as well as having been started on maintenance IVF. Discussed with Dr. Maudie Mercury, and with the consideration that this could possibly be more of a dilutional effect than active bleeding, will recheck H&H at 0530, if decreased, will likely transfuse. No known source of bleeding and patient vital signs remain stable.   Michelle Degraffenreid, DO

## 2020-04-29 NOTE — ED Notes (Signed)
MD Liliand paged for Hemoglobin 6.7

## 2020-04-29 NOTE — ED Notes (Signed)
Transported to CT 

## 2020-04-29 NOTE — Progress Notes (Addendum)
FPTS Interim Progress Note  S: Called to bedside as daughter wanted an update from primary team and wanted to discuss need for blood transfusion further.  Patient reports she is doing fine.  She denies any abdominal pain.  She denies any sources of obvious bleeding at this time.  O: BP (!) 157/73   Pulse 86   Temp 98.6 F (37 C) (Oral)   Resp 17   SpO2 97%   General: Well-appearing, nontoxic.  Improved mentation from 24 hours ago.  Responding to questions more quickly speech is not slurred.  A/P: Discussed risks and benefits of blood transfusion with patient and daughter.  Both are in agreement at this time.  No further questions. Patient is asymptomatic at this time.  Her vital signs are stable with maps in the 90s. Continue current care  Wilber Oliphant, MD 04/29/2020, 9:21 PM PGY-3, [facility] pager [phone]

## 2020-04-29 NOTE — Evaluation (Signed)
Physical Therapy Evaluation Patient Details Name: Michelle Abbott MRN: [id number] DOB: [birth year] Today's Date: 04/29/2020   History of Present Illness  Pt is a 74 y/o female admitted secondary to increased weakness and confusion. Pt is recently s/p L3-5 PLIF on 12/10. PMH includes DM, HTN, and CKD.  Clinical Impression  Pt admitted secondary to problem above with deficits below. Pt limited secondary to pain and requiring max A to come to sitting at edge of stretcher. Pt with increased difficulty with mobility tasks and has been unable to ambulate since being discharged home following back surgery. Feel pt will likely benefit from SNF level therapies at d/c. Will continue to follow acutely.     Follow Up Recommendations SNF    Equipment Recommendations  Wheelchair cushion (measurements PT);Wheelchair (measurements PT)    Recommendations for Other Services       Precautions / Restrictions Precautions Precautions: Back Precaution Booklet Issued: No Precaution Comments: Verbally reviewed back precautions. Restrictions Weight Bearing Restrictions: No      Mobility  Bed Mobility Overal bed mobility: Needs Assistance Bed Mobility: Rolling;Sidelying to Sit;Sit to Sidelying Rolling: Max assist Sidelying to sit: Max assist     Sit to sidelying: Max assist General bed mobility comments: Max A to come to sitting. Increased time required. Pt able to assist minimally.    Transfers                 General transfer comment: unable  Ambulation/Gait                Stairs            Wheelchair Mobility    Modified Rankin (Stroke Patients Only)       Balance Overall balance assessment: Needs assistance Sitting-balance support: Bilateral upper extremity supported;Feet unsupported Sitting balance-Leahy Scale: Poor Sitting balance - Comments: Reliant on mod to max A to maintain sitting balance on stretcher                                      Pertinent Vitals/Pain Pain Assessment: Faces Faces Pain Scale: Hurts whole lot Pain Location: back Pain Descriptors / Indicators: Aching;Discomfort Pain Intervention(s): Monitored during session;Limited activity within patient's tolerance;Repositioned    Home Living Family/patient expects to be discharged to:: Private residence Living Arrangements: Alone Available Help at Discharge: Family;Available 24 hours/day Type of Home: Apartment Home Access: Stairs to enter   Entrance Stairs-Number of Steps: 2 Home Layout: One level Home Equipment: Cane - single point;Grab bars - tub/shower;Hand held shower head;Walker - 2 wheels      Prior Function Level of Independence: Independent with assistive device(s)         Comments: Had been using RW for mobility since surgery, however, has since had difficulty ambulating.     Hand Dominance        Extremity/Trunk Assessment   Upper Extremity Assessment Upper Extremity Assessment: Defer to OT evaluation    Lower Extremity Assessment Lower Extremity Assessment: Generalized weakness;RLE deficits/detail RLE Deficits / Details: Pt reports more difficulty using RW. Was able to perform heel slide    Cervical / Trunk Assessment Cervical / Trunk Assessment: Other exceptions Cervical / Trunk Exceptions: recent PLIF  Communication   Communication: No difficulties  Cognition Arousal/Alertness: Awake/alert Behavior During Therapy: WFL for tasks assessed/performed Overall Cognitive Status: No family/caregiver present to determine baseline cognitive functioning  General Comments      Exercises     Assessment/Plan    PT Assessment Patient needs continued PT services  PT Problem List Decreased strength;Decreased activity tolerance;Decreased balance;Decreased mobility;Decreased knowledge of use of DME;Decreased range of motion;Pain       PT Treatment Interventions DME  instruction;Gait training;Stair training;Therapeutic activities;Functional mobility training;Balance training;Therapeutic exercise;Patient/family education    PT Goals (Current goals can be found in the Care Plan section)  Acute Rehab PT Goals Patient Stated Goal: to be able to walk PT Goal Formulation: With patient Time For Goal Achievement: 05/13/20 Potential to Achieve Goals: Good    Frequency Min 3X/week   Barriers to discharge        Co-evaluation               AM-PAC PT "6 Clicks" Mobility  Outcome Measure Help needed turning from your back to your side while in a flat bed without using bedrails?: A Lot Help needed moving from lying on your back to sitting on the side of a flat bed without using bedrails?: A Lot Help needed moving to and from a bed to a chair (including a wheelchair)?: Total Help needed standing up from a chair using your arms (e.g., wheelchair or bedside chair)?: Total Help needed to walk in hospital room?: Total Help needed climbing 3-5 steps with a railing? : Total 6 Click Score: 8    End of Session   Activity Tolerance: Patient limited by pain Patient left: in bed;with call bell/phone within reach (on stretcher in ED) Nurse Communication: Mobility status PT Visit Diagnosis: Unsteadiness on feet (R26.81);Muscle weakness (generalized) (M62.81);Difficulty in walking, not elsewhere classified (R26.2)    Time: 3704-8889 PT Time Calculation (min) (ACUTE ONLY): 11 min   Charges:   PT Evaluation $PT Eval Moderate Complexity: 1 Mod          Reuel Derby, PT, DPT  Acute Rehabilitation Services  Pager: [phone] Office: [phone]   Rudean Hitt 04/29/2020, 1:43 PM

## 2020-04-29 NOTE — Progress Notes (Signed)
Spoke with Dr Saintclair Halsted from Neurosurgery who recommended repeat MRI if patient reduced motor strength, bowel/bladder incontinence etc. I explained that the night team who admitted her found no neurological deficits and she was likely unable to stand due to altered mental status. Neurosurgery will follow on this hospital admission. Appreciate recommendations.  Lattie Haw MD  PGY-2, [facility]

## 2020-04-29 NOTE — Progress Notes (Signed)
Family Medicine Teaching Service Daily Progress Note Intern Pager: [phone]  Patient name: Michelle Abbott Medical record number: [id number] Date of birth: 05/23/1945 Age: 74 y.o. Gender: female  Primary Care Provider: Gladys Damme, MD Consultants: None Code Status: FULL  Pt Overview and Major Events to Date:  Admitted: 12/12  Assessment and Plan: Michelle Abbott is a 74 y.o. female who presented with inability to stand s/p recent back surgery on 12/10, and slow slurred speech thought to be due to pain medication. Admission labs with AKI in the setting of decreased PO intake. PMH is significant for sciatica, T2DM, CKD, gout, HTN, anemia, OA, HDL, lumbar spinal stenosis s/p PILF.  Acute kidney injury  CKD stage IV Cr 4.03>3.54. Baseline appears to be around 2.05 over the last year.  UA notable for protein, rare bacteria, moderate hemoglobin, and glucose of 50. AKI likely pre-renal injury secondary to decreased oral intake. Can also consider chronic medications that can affect the kidneys (losartan, chlorthalidone, allopurinol).  - Vitals per routine - Daily RFP - Avoid nephrotoxic agents as able - Strict Is&Os - Continue holding chlorthalidone, losartan, allopurinol, colchicine - mIVF 115mL/hr LR - encourage PO fluid intake  Altered mentation in the setting of recent pain meds Speech is still slightly slurred this morning but comprehensible.  Patient is alert and oriented to person, place, time, situation. Neuro exam overall normal with the exception of slightly decreased sensation on right side of face..  Limited ROM of bilateral hip flexion to about 10 degrees, but strength 5/5, able to resist motion.  Given overall normal neuro examination, do not suspect acute head bleed or stroke though will get a CT head to further evaluate.   -Follow-up noncontrast CT Head  Back pain Lumbar spinal stenosis s/p posterior lumbar interbody fusion (PILF) On 04/26/20, patient underwent  posterior lumbar interbody fusion without complications. D/c on 12/11 with oxycodone 10mg  q4h. Due to concern for side effects from pain medication, have decreased dose to 2.5mg  oxycodone, though patient has not required any pain medications since admission.  Patient denies any pain this morning. Patient did not receive any pain medications overnight. Tylenol has been changed from PRN to scheduled.  - F/u Neurosurgery recs - Oxycodone 2.5mg  q4h PRN - Tylenol 650mg  q6h PRN - PT/OT eval and treat - CT Lumbar spine  Acute on chronic normocytic anemia Hgb 7.5>6.7>7. Transfusion threshold of 7. Baseline appears to be around 9-10. Currently takes ferrous sulfate 325 mg daily. Likely related to recent spinal surgery on 12/10 with reported EBL of 400cc.  - Monitor with CBC - Afternoon H&H; transfuse if trending down.  HTN BP increasing since midnight, ranging 164-178/61-68. In the setting of home chlorthalidone and losartan being held.  - Continue Carvedilol 25 mg BID  - Vitals per routine - Hold chlorthalidone, losartan  T2DM Blood sugar 122 this AM on BMP. A1C on 8/12 was 7.9%. Home medications include Lantus 40U, ozempic (on Wednesdays).  - sSSI - CBGs TID AC and QHS - Hold lantus due to decrease oral intake, holding ozempic  Mild leukocytosis: resolved WBC 11.5>8.7>8.9. Likely 2/2 recent surgery and dehydration given AKI and decreased PO intake since recent surgery.  - Monitor with CBC - Monitor for fever - Monitor surgical wound site for signs of infection  HLD: chronic, stable Last lipid panel 12/28/19, normal. Home medications include Rosuvastatin 40 mg daily. - Continue home rosuvastatin   FEN/GI: Renal/carb modified PPx: Lovenox   Status is: Observation  The patient remains OBS appropriate and will  d/c before 2 midnights.  Dispo: The patient is from: Home              Anticipated d/c is to: Home              Anticipated d/c date is: 1 day              Patient currently  is medically stable to d/c.     Subjective:  Patient is accompanied by her daughter this morning.  Daughter reports that patient continues to have slurred speech.  She is wondering if it is possible that she had a stroke.  Patient lives alone but daughter has been coming to visit her.  She has not eaten in the last couple days following the surgery.  She was previously able to walk following surgery but has not been able to for the past day.  When patient is asked why she states "I guess because of the pain".  Daughter also notes that patient has been intermittently "shaking".  Was previously told these were residual effects from the anesthesia.  Patient's eyes rolled back occasionally with this but she is able to answer questions and is not incontinent of urine.  Patient last had pain medication yesterday morning.  Objective: Temp:  [99.6 F (37.6 C)-99.8 F (37.7 C)] 99.8 F (37.7 C) (12/12 2042) Pulse Rate:  [88-97] 92 (12/13 0530) Resp:  [13-28] 15 (12/13 0530) BP: (125-178)/(51-142) 164/65 (12/13 0530) SpO2:  [96 %-100 %] 96 % (12/13 0530) Physical Exam: General: AxO x4 (person, place, time and situation), pleasant, slightly slurred speech but comprehensible  Cardiovascular: RRR, without murmur Respiratory: CTAB, without wheezing/rhonchi/rales  Abdomen: Normoactive BS, no tenderness to palpation in all quadrants, no R/G Extremities: 1+ pitting edema b/l ankles, 2+ DP and radial pulses, warm and dry Neuro: CN 2-12 intact though slight decrease in sensation of right-side of face, normal sensation bilateral lower extremities, 5/5 strength upper and lower extremities.  Able to answer questions and follow commands appropriately.  Laboratory: Recent Labs  Lab 04/24/20 1100 04/28/20 1508 04/29/20 0234  WBC 5.3 11.5* 8.7  HGB 9.5* 7.5* 6.7*  HCT 32.8* 25.4* 22.1*  PLT 210 212 183   Recent Labs  Lab 04/24/20 1100 04/28/20 1508 04/29/20 0234  NA 140 137 137  K 3.4* 4.3 3.9  CL  105 106 106  CO2 24 20* 21*  BUN 46* 56* 56*  CREATININE 2.58* 4.03* 3.54*  CALCIUM 8.9 8.6* 8.5*  GLUCOSE 136* 185* 122*    Imaging/Diagnostic Tests: None new.  Sharion Settler, DO 04/29/2020, 5:49 AM PGY-1, [facility] Intern pager: [phone], text pages welcome

## 2020-04-30 LAB — CBC
HCT: 25.3 % — ABNORMAL LOW (ref 36.0–46.0)
Hemoglobin: 7.8 g/dL — ABNORMAL LOW (ref 12.0–15.0)
MCH: 27 pg (ref 26.0–34.0)
MCHC: 30.8 g/dL (ref 30.0–36.0)
MCV: 87.5 fL (ref 80.0–100.0)
Platelets: 186 10*3/uL (ref 150–400)
RBC: 2.89 MIL/uL — ABNORMAL LOW (ref 3.87–5.11)
RDW: 14.7 % (ref 11.5–15.5)
WBC: 7.2 10*3/uL (ref 4.0–10.5)
nRBC: 0 % (ref 0.0–0.2)

## 2020-04-30 LAB — GLUCOSE, CAPILLARY
Glucose-Capillary: 97 mg/dL (ref 70–99)
Glucose-Capillary: 128 mg/dL — ABNORMAL HIGH (ref 70–99)
Glucose-Capillary: 129 mg/dL — ABNORMAL HIGH (ref 70–99)
Glucose-Capillary: 220 mg/dL — ABNORMAL HIGH (ref 70–99)

## 2020-04-30 LAB — BASIC METABOLIC PANEL
Anion gap: 12 (ref 5–15)
BUN: 48 mg/dL — ABNORMAL HIGH (ref 8–23)
CO2: 20 mmol/L — ABNORMAL LOW (ref 22–32)
Calcium: 8.6 mg/dL — ABNORMAL LOW (ref 8.9–10.3)
Chloride: 108 mmol/L (ref 98–111)
Creatinine, Ser: 3.01 mg/dL — ABNORMAL HIGH (ref 0.44–1.00)
GFR, Estimated: 16 mL/min — ABNORMAL LOW (ref >60)
Glucose, Bld: 105 mg/dL — ABNORMAL HIGH (ref 70–99)
Potassium: 3.7 mmol/L (ref 3.5–5.1)
Sodium: 140 mmol/L (ref 135–145)

## 2020-04-30 MED ORDER — INSULIN ASPART 100 UNIT/ML ~~LOC~~ SOLN
0.0000 [IU] | Freq: Three times a day (TID) | SUBCUTANEOUS | Status: DC
  Administered 2020-04-30 (×2): 1 [IU] via SUBCUTANEOUS
  Administered 2020-05-01 – 2020-05-02 (×4): 2 [IU] via SUBCUTANEOUS
  Administered 2020-05-02: 07:00:00 1 [IU] via SUBCUTANEOUS

## 2020-04-30 MED ORDER — POLYETHYLENE GLYCOL 3350 17 G PO PACK
17.0000 g | PACK | Freq: Every day | ORAL | Status: DC
  Administered 2020-04-30 – 2020-05-02 (×3): 17 g via ORAL
  Filled 2020-04-30 (×3): qty 1

## 2020-04-30 MED ORDER — PANTOPRAZOLE SODIUM 40 MG PO TBEC
80.0000 mg | DELAYED_RELEASE_TABLET | Freq: Every day | ORAL | Status: DC
  Administered 2020-04-30 – 2020-05-02 (×3): 80 mg via ORAL
  Filled 2020-04-30 (×3): qty 2

## 2020-04-30 MED ORDER — AMLODIPINE BESYLATE 5 MG PO TABS
5.0000 mg | ORAL_TABLET | Freq: Every day | ORAL | Status: DC
Start: 2020-04-30 — End: 2020-05-02
  Administered 2020-04-30 – 2020-05-02 (×3): 5 mg via ORAL
  Filled 2020-04-30 (×3): qty 1

## 2020-04-30 MED ORDER — INFLUENZA VAC A&B SA ADJ QUAD 0.5 ML IM PRSY
0.5000 mL | PREFILLED_SYRINGE | INTRAMUSCULAR | Status: AC
  Administered 2020-05-02: 11:00:00 0.5 mL via INTRAMUSCULAR
  Filled 2020-04-30 (×2): qty 0.5

## 2020-04-30 NOTE — TOC Initial Note (Signed)
Transition of Care [facility]) - Initial/Assessment Note    Patient Details  Name: Michelle Abbott MRN: [id number] Date of Birth: [birth year]  Transition of Care [facility]) CM/SW Contact:    Bethann Berkshire, [place] Phone Number: 04/30/2020, 9:25 AM  Clinical Narrative:                  CSW received consult for possible SNF placement at time of discharge. CSW spoke with patient regarding PT recommendation of SNF placement at time of discharge. Patient reported that she lives alone in an apartment; her daughter lives in the same apartment complex. Patient expressed understanding of PT recommendation and is agreeable to SNF placement at time of discharge. CSW discussed insurance authorization process and provided Medicare SNF ratings list. No further questions reported at this time. CSW to continue to follow and assist with discharge planning needs.  Fl2 completed and bed requests sent in HUB  Expected Discharge Plan: [place] Barriers to Discharge: SNF Pending bed offer   Patient Goals and CMS Choice Patient states their goals for this hospitalization and ongoing recovery are:: Rehab CMS Medicare.gov Compare Post Acute Care list provided to:: Patient Choice offered to / list presented to : Patient  Expected Discharge Plan and Services Expected Discharge Plan: [place]       Living arrangements for the past 2 months: Apartment                                      Prior Living Arrangements/Services Living arrangements for the past 2 months: Apartment Lives with:: Self Patient language and need for interpreter reviewed:: Yes        Need for Family Participation in Patient Care: No (Comment) Care giver support system in place?: Yes (comment)   Criminal Activity/Legal Involvement Pertinent to Current Situation/Hospitalization: No - Comment as needed  Activities of Daily Living Home Assistive Devices/Equipment: Eyeglasses,Dentures (specify type),Brace  (specify type) ADL Screening (condition at time of admission) Patient's cognitive ability adequate to safely complete daily activities?: Yes Is the patient deaf or have difficulty hearing?: No Does the patient have difficulty seeing, even when wearing glasses/contacts?: No Does the patient have difficulty concentrating, remembering, or making decisions?: Yes Patient able to express need for assistance with ADLs?: Yes Does the patient have difficulty dressing or bathing?: Yes Independently performs ADLs?: No Communication: Independent Dressing (OT): Needs assistance Is this a change from baseline?: Change from baseline, expected to last >3 days Grooming: Independent Feeding: Independent Bathing: Needs assistance Is this a change from baseline?: Change from baseline, expected to last >3 days Toileting: Needs assistance Is this a change from baseline?: Change from baseline, expected to last >3days In/Out Bed: Needs assistance Is this a change from baseline?: Change from baseline, expected to last >3 days Walks in Home: Needs assistance Is this a change from baseline?: Change from baseline, expected to last >3 days Does the patient have difficulty walking or climbing stairs?: Yes Weakness of Legs: Both Weakness of Arms/Hands: None  Permission Sought/Granted   Permission granted to share information with : Yes, Verbal Permission Granted  Share Information with NAME: Ceri, Mayer (Daughter)   [phone] (Mobile)           Emotional Assessment Appearance:: Appears stated age Attitude/Demeanor/Rapport: Engaged Affect (typically observed): Accepting Orientation: : Oriented to Self,Oriented to Place,Oriented to  Time,Oriented to Situation Alcohol / Substance Use: Not Applicable Psych Involvement: No (comment)  Admission diagnosis:  AKI (acute kidney injury) ([place]) [N17.9] Acute renal failure superimposed on stage 4 chronic kidney disease ([facility]) [N17.9, N18.4] Acute kidney  injury superimposed on chronic kidney disease ([place]) [N17.9, N18.9] Patient Active Problem List   Diagnosis Date Noted  . Dehydration 04/29/2020  . Postoperative pain 04/29/2020  . Hemoglobin decreased of chronic normocytic anemia 04/29/2020  . Acute confusion 04/29/2020  . AKI (acute kidney injury) ([place]) 04/29/2020  . Acute kidney injury superimposed on chronic kidney disease ([place]) 04/28/2020  . Spondylolisthesis at L4-L5 level 04/26/2020  . Rectal bleeding, History of 12/28/2019  . Age-related hearing loss 03/12/2019  . Sciatica 11/17/2016  . Echocardiogram shows left ventricular diastolic dysfunction 46/50/3546  . CKD (chronic kidney disease), stage IV ([place]) 07/21/2016  . Type 2 diabetes mellitus with diabetic neuropathy, with long-term current use of insulin ([place]) 06/15/2016  . Gout 06/15/2016  . Essential hypertension 06/15/2016  . Chronic anemia 06/15/2016  . Vitamin D deficiency 06/15/2016  . Osteoarthritis 06/15/2016  . Hyperlipemia 06/15/2016  . Back pain 06/15/2016   PCP:  Gladys Damme, MD Pharmacy:   [id number] - [address], [address] [place] [address] Phone: [phone] Fax: [place], [address], [address] [place], [place] [address] Phone: [phone] Fax: [phone]     Social Determinants of Health (SDOH) Interventions    Readmission Risk Interventions No flowsheet data found.

## 2020-04-30 NOTE — Progress Notes (Signed)
Unable to find patient's black Android cell phone upon patient arrival to [facility] states it was left on a table in the Emergency Room. This RN called the ED. Staff unable to locate the cell phone at this time, but they will keep looking. Pt's new room, bed, belongings bags, trash bags, and linen bag searched again without success. Pt's cell phone called multiple times, but went to voicemail each time. Will continue to monitor.

## 2020-04-30 NOTE — Evaluation (Signed)
Occupational Therapy Evaluation Patient Details Name: Michelle Abbott MRN: [id number] DOB: [birth year] Today's Date: 04/29/2020    History of Present Illness Pt is a 74 y/o female admitted secondary to increased weakness and confusion. Pt is recently s/p L3-5 PLIF on 12/10. PMH includes DM, HTN, and CKD.   Clinical Impression   Pt seen 12/13 delayed entry. Pt PTA: Pt living alone and unable to get around well since she came home from back sx. Pt did not elaborate. Pt was modified independent for ambulating and ADL care d/c after back sx. Currently, pt is limited by decreased strength, decreased ability to care for self, and decreased activity tolerance. Pt with decreased arousal and decreased command follow. Bed mobility was near [place] for rolling and TotalA for almost getting to EOB; Pt following decreased commands.  Pt would greatly benefit from continued OT skilled services. Pt appears to be very motivated to return to PLOF. OT following acutely.    Follow Up Recommendations  CIR    Equipment Recommendations  3 in 1 bedside commode    Recommendations for Other Services Rehab consult     Precautions / Restrictions Precautions Precautions: Back Precaution Booklet Issued: No Precaution Comments: Verbally reviewed back precautions. Required Braces or Orthoses: Spinal Brace Spinal Brace: Lumbar corset Restrictions Weight Bearing Restrictions: No      Mobility Bed Mobility Overal bed mobility: Needs Assistance Bed Mobility: Rolling;Sidelying to Sit;Sit to Sidelying Rolling: Max assist Sidelying to sit: Total assist     Sit to sidelying: Total assist General bed mobility comments: TotalA for almost getting to EOB; Pt following decreased commands.    Transfers                 General transfer comment: unable    Balance Overall balance assessment: Needs assistance Sitting-balance support: Bilateral upper extremity supported;Feet unsupported Sitting balance-Leahy  Scale: Zero Sitting balance - Comments: Pt requiring assist for leaning on bed and unable to tolerate EOB at this time.                                   ADL either performed or assessed with clinical judgement   ADL Overall ADL's : Needs assistance/impaired Eating/Feeding: Set up;Bed level   Grooming: Minimal assistance;Bed level   Upper Body Bathing: Moderate assistance;Bed level   Lower Body Bathing: Total assistance;Bed level   Upper Body Dressing : Moderate assistance;Bed level   Lower Body Dressing: Total assistance;Bed level   Toilet Transfer: Total assistance;+2 for physical assistance;+2 for safety/equipment Toilet Transfer Details (indicate cue type and reason): unable to attempt with decreased command follow and decreased strength Toileting- Clothing Manipulation and Hygiene: Total assistance       Functional mobility during ADLs: Total assistance General ADL Comments: Pt limited by decreased strength, decreased ability to care for self, and decreased activity tolerance. Pt with decreased arousal and decreased command follow.     Vision Baseline Vision/History: No visual deficits Vision Assessment?: No apparent visual deficits     Perception     Praxis      Pertinent Vitals/Pain Pain Assessment: Faces Faces Pain Scale: Hurts whole lot Pain Location: back Pain Descriptors / Indicators: Aching;Discomfort Pain Intervention(s): Monitored during session;Repositioned     Hand Dominance Right   Extremity/Trunk Assessment Upper Extremity Assessment Upper Extremity Assessment: Generalized weakness;RUE deficits/detail;LUE deficits/detail RUE Deficits / Details: decreased ROM at shoulders <80* FF, elbow through digits AAROM WFLs and poor grip  strength RUE Sensation: WNL RUE Coordination: decreased fine motor;decreased gross motor LUE Deficits / Details: decreased ROM at shoulders <80* FF, elbow through digits AAROM WFLs and poor grip strength LUE  Sensation: WNL LUE Coordination: decreased fine motor;decreased gross motor   Lower Extremity Assessment RLE Deficits / Details: Pt reports more difficulty using RW. Was able to perform heel slide   Cervical / Trunk Assessment Cervical / Trunk Assessment: Other exceptions Cervical / Trunk Exceptions: recent PLIF   Communication Communication Communication: No difficulties   Cognition Arousal/Alertness: Awake/alert Behavior During Therapy: WFL for tasks assessed/performed Overall Cognitive Status: No family/caregiver present to determine baseline cognitive functioning                                     General Comments  No family present. VSS on RA.    Exercises     Shoulder Instructions      Home Living Family/patient expects to be discharged to:: Private residence Living Arrangements: Alone Available Help at Discharge: Family;Available 24 hours/day Type of Home: Apartment Home Access: Stairs to enter [employer] of Steps: 2   Home Layout: One level     Bathroom Shower/Tub: Tub/shower unit;Curtain   Biochemist, clinical: Standard     Home Equipment: Cane - single point;Grab bars - tub/shower;Hand held Tourist information centre manager - 2 wheels          Prior Functioning/Environment Level of Independence: Independent with assistive device(s)        Comments: Had been using RW for mobility since surgery, however, has since had difficulty ambulating.        OT Problem List: Decreased strength;Decreased activity tolerance;Impaired balance (sitting and/or standing);Decreased safety awareness;Impaired UE functional use;Pain;Increased edema;Decreased knowledge of use of DME or AE;Decreased cognition      OT Treatment/Interventions: Self-care/ADL training;DME and/or AE instruction;Therapeutic activities;Patient/family education    OT Goals(Current goals can be found in the care plan section) Acute Rehab OT Goals Patient Stated Goal: Pt did not state OT  Goal Formulation: With patient Time For Goal Achievement: 05/14/20 Potential to Achieve Goals: Fair ADL Goals Pt Will Perform Grooming: with supervision;sitting Pt Will Perform Upper Body Dressing: with min guard assist;sitting Pt Will Perform Lower Body Dressing: with min assist;sitting/lateral leans;sit to/from stand Pt Will Transfer to Toilet: bedside commode;stand pivot transfer;with min assist Pt Will Perform Toileting - Clothing Manipulation and hygiene: with min assist;sitting/lateral leans;sit to/from stand Pt/caregiver will Perform Home Exercise Program: Increased strength;Both right and left upper extremity  OT Frequency: Min 2X/week   Barriers to D/C:            Co-evaluation              AM-PAC OT "6 Clicks" Daily Activity     Outcome Measure Help from another person eating meals?: A Little Help from another person taking care of personal grooming?: A Lot Help from another person toileting, which includes using toliet, bedpan, or urinal?: Total Help from another person bathing (including washing, rinsing, drying)?: A Lot Help from another person to put on and taking off regular upper body clothing?: A Lot Help from another person to put on and taking off regular lower body clothing?: Total 6 Click Score: 11   End of Session Nurse Communication: Mobility status  Activity Tolerance: Patient tolerated treatment well Patient left: in bed;with call bell/phone within reach  OT Visit Diagnosis: Unsteadiness on feet (R26.81);Muscle weakness (generalized) (M62.81);Other symptoms and  signs involving cognitive function Pain - part of body:  (back)                Time: 6770-3403 OT Time Calculation (min): 15 min Charges:  OT General Charges $OT Visit: 1 Visit OT Evaluation $OT Eval Moderate Complexity: 1 Mod  Jefferey Pica, OTR/L Acute Rehabilitation Services Pager: [phone] Office: [phone]   Jerene Pitch 04/29/20 5:00p

## 2020-04-30 NOTE — Progress Notes (Signed)
Physical Therapy Treatment Patient Details Name: Michelle Abbott MRN: [id number] DOB: April 02, 1946 Today's Date: 04/30/2020    History of Present Illness Pt is a 74 y/o female admitted secondary to increased weakness and confusion. Pt is recently s/p L3-5 PLIF on 12/10. PMH includes DM, HTN, and CKD.    PT Comments    Pt progressing towards her physical therapy goals. Pt reporting right groin pain, but improving RLE strength. Denies numbness/tingling. Pt requiring moderate assist for bed mobility, pivoting from bed to chair using a walker and min assist. Pt with noted cognitive impairments including difficulty with problem solving and following commands. Also displays RLE weakness and balance deficits. Suspect she will progress well with intensive level therapies to address deficits and maximize functional independence based on her PLOF. Updated plan to CIR.     Follow Up Recommendations  CIR     Equipment Recommendations  Wheelchair cushion (measurements PT);Wheelchair (measurements PT)    Recommendations for Other Services Rehab consult     Precautions / Restrictions Precautions Precautions: Back Precaution Booklet Issued: No Precaution Comments: Verbally reviewed back precautions. Required Braces or Orthoses: Spinal Brace Spinal Brace: Lumbar corset;Applied in sitting position Restrictions Weight Bearing Restrictions: No    Mobility  Bed Mobility Overal bed mobility: Needs Assistance Bed Mobility: Rolling;Sidelying to Sit Rolling: Mod assist Sidelying to sit: Mod assist     Sit to sidelying: Total assist General bed mobility comments: ModA for rolling to left and sitting up to edge of bed. Max cues for problem solving, initiation of task  Transfers Overall transfer level: Needs assistance Equipment used: Rolling walker (2 wheeled) Transfers: Sit to/from [facility] Sit to Stand: Min assist Stand pivot transfers: Min assist       General transfer  comment: Pt unable to achieve standing on first attempt, successful on second attempt with minA. Pt pulling up on walker despite max cueing, took increased time/effort to problem solve and initiate. Pivoting to left to chair.  Ambulation/Gait                 Stairs             Wheelchair Mobility    Modified Rankin (Stroke Patients Only)       Balance Overall balance assessment: Needs assistance Sitting-balance support: Bilateral upper extremity supported;Feet supported Sitting balance-Leahy Scale: Poor Sitting balance - Comments: reliant on BUE support, supervision for safety   Standing balance support: Bilateral upper extremity supported Standing balance-Leahy Scale: Poor Standing balance comment: reliant on external support                            Cognition Arousal/Alertness: Awake/alert Behavior During Therapy: Flat affect Overall Cognitive Status: Impaired/Different from baseline Area of Impairment: Following commands;Safety/judgement;Problem solving                       Following Commands: Follows one step commands inconsistently;Follows one step commands with increased time Safety/Judgement: Decreased awareness of safety;Decreased awareness of deficits   Problem Solving: Slow processing;Decreased initiation;Requires verbal cues General Comments: Very slow processing, at times does not respond, requires max repetition for certain motor tasks i.e. took 5 minutes to problem solve how to stand from edge of bed and pt continuing to pull up from walker. Flat affect. Oriented to day of the week.      Exercises General Exercises - Lower Extremity Ankle Circles/Pumps: Both;10 reps;Supine Quad Sets: Both;10 reps;Supine Long Arc  Quad: Both;5 reps;Supine Heel Slides: Both;5 reps;Supine    General Comments General comments (skin integrity, edema, etc.): No family present. VSS on RA.      Pertinent Vitals/Pain Pain Assessment:  Faces Faces Pain Scale: Hurts whole lot Pain Location: back, right groin Pain Descriptors / Indicators: Aching;Discomfort Pain Intervention(s): Limited activity within patient's tolerance;Monitored during session;Patient requesting pain meds-RN notified    Home Living Family/patient expects to be discharged to:: Private residence Living Arrangements: Alone Available Help at Discharge: Family;Available 24 hours/day Type of Home: Apartment Home Access: Stairs to enter   Home Layout: One level Home Equipment: Cane - single point;Grab bars - tub/shower;Hand held shower head;Walker - 2 wheels      Prior Function Level of Independence: Independent with assistive device(s)      Comments: Had been using RW for mobility since surgery, however, has since had difficulty ambulating.   PT Goals (current goals can now be found in the care plan section) Acute Rehab PT Goals Patient Stated Goal: Pt did not state Potential to Achieve Goals: Good Progress towards PT goals: Progressing toward goals    Frequency    Min 3X/week      PT Plan Discharge plan needs to be updated    Co-evaluation              AM-PAC PT "6 Clicks" Mobility   Outcome Measure  Help needed turning from your back to your side while in a flat bed without using bedrails?: A Lot Help needed moving from lying on your back to sitting on the side of a flat bed without using bedrails?: A Lot Help needed moving to and from a bed to a chair (including a wheelchair)?: A Little Help needed standing up from a chair using your arms (e.g., wheelchair or bedside chair)?: A Little Help needed to walk in hospital room?: A Lot Help needed climbing 3-5 steps with a railing? : Total 6 Click Score: 13    End of Session Equipment Utilized During Treatment: Gait belt;Back brace Activity Tolerance: Patient tolerated treatment well Patient left: in chair;with call bell/phone within reach;with chair alarm set Nurse Communication:  Mobility status;Patient requests pain meds PT Visit Diagnosis: Unsteadiness on feet (R26.81);Muscle weakness (generalized) (M62.81);Difficulty in walking, not elsewhere classified (R26.2)     Time: 9179-1505 PT Time Calculation (min) (ACUTE ONLY): 27 min  Charges:  $Therapeutic Exercise: 8-22 mins $Therapeutic Activity: 8-22 mins                     Wyona Almas, PT, DPT Acute Rehabilitation Services Pager [phone] Office [phone]    Deno Etienne 04/30/2020, 12:09 PM

## 2020-04-30 NOTE — NC FL2 (Signed)
[place] LEVEL OF CARE SCREENING TOOL     IDENTIFICATION  Patient Name: Michelle Abbott Birthdate: January 02, 1946 Sex: female Admission Date (Current Location): 04/28/2020  [facility] and [place] Number:  Herbalist and Address:  [facility] [place] [address], [place], [place] [address]      Provider Number: [id number]  Attending Physician Name and Address:  McDiarmid, Blane Ohara, MD  Relative Name and Phone Number:  Elissia, [place] (Daughter)   [phone] [facility])    Current Level of Care: Hospital Recommended Level of Care: [facility] Prior Approval Number:    Date Approved/Denied:   PASRR Number: [id number]  Discharge Plan: SNF    Current Diagnoses: Patient Active Problem List   Diagnosis Date Noted  . Dehydration 04/29/2020  . Postoperative pain 04/29/2020  . Hemoglobin decreased of chronic normocytic anemia 04/29/2020  . Acute confusion 04/29/2020  . AKI (acute kidney injury) ([place]) 04/29/2020  . Acute kidney injury superimposed on chronic kidney disease ([place]) 04/28/2020  . Spondylolisthesis at L4-L5 level 04/26/2020  . Rectal bleeding, History of 12/28/2019  . Age-related hearing loss 03/12/2019  . Sciatica 11/17/2016  . Echocardiogram shows left ventricular diastolic dysfunction 79/06/4095  . CKD (chronic kidney disease), stage IV ([place]) 07/21/2016  . Type 2 diabetes mellitus with diabetic neuropathy, with long-term current use of insulin ([place]) 06/15/2016  . Gout 06/15/2016  . Essential hypertension 06/15/2016  . Chronic anemia 06/15/2016  . Vitamin D deficiency 06/15/2016  . Osteoarthritis 06/15/2016  . Hyperlipemia 06/15/2016  . Back pain 06/15/2016    Orientation RESPIRATION BLADDER Height & Weight     Self,Time,Situation,Place  Normal External catheter Weight: 256 lb 9.9 oz (116.4 kg) Height:  5\' 3"  (160 cm)  BEHAVIORAL SYMPTOMS/MOOD NEUROLOGICAL BOWEL NUTRITION STATUS      Continent Diet (See  d/c summary)  AMBULATORY STATUS COMMUNICATION OF NEEDS Skin   Extensive Assist Verbally Surgical wounds (Incision, back)                       Personal Care Assistance Level of Assistance  Bathing,Feeding,Dressing Bathing Assistance: Maximum assistance Feeding assistance: Independent Dressing Assistance: Maximum assistance     Functional Limitations Info  Sight,Hearing,Speech Sight Info: Impaired Hearing Info: Adequate Speech Info: Adequate    SPECIAL CARE FACTORS FREQUENCY  PT (By licensed PT),OT (By licensed OT)     PT Frequency: 5x/week OT Frequency: 5x/week            Contractures Contractures Info: Not present    Additional Factors Info  Code Status,Allergies Code Status Info: FULL Allergies Info: Ciprofloxacin Hcl, metformin, ace inhibitors, Baclofen, amlodapine           Current Medications (04/30/2020):  This is the current hospital active medication list Current Facility-Administered Medications  Medication Dose Route Frequency Provider Last Rate Last Admin  . 0.9 %  sodium chloride infusion (Manually program via Guardrails IV Fluids)   Intravenous Once Lattie Haw, MD      . acetaminophen (TYLENOL) tablet 650 mg  650 mg Oral Q6H Lilland, Alana, DO   650 mg at 04/30/20 3532   Or  . acetaminophen (TYLENOL) suppository 650 mg  650 mg Rectal Q6H Lilland, Alana, DO      . carvedilol (COREG) tablet 25 mg  25 mg Oral BID WC Lilland, Alana, DO   25 mg at 04/30/20 0841  . ferrous sulfate tablet 325 mg  325 mg Oral Daily Lilland, Alana, DO   325  mg at 04/30/20 0841  . [START ON 05/01/2020] influenza vaccine adjuvanted (FLUAD) injection 0.5 mL  0.5 mL Intramuscular Tomorrow-1000 McDiarmid, Blane Ohara, MD      . insulin aspart (novoLOG) injection 0-9 Units  0-9 Units Subcutaneous TID WC McDiarmid, Blane Ohara, MD      . lactated ringers infusion   Intravenous Continuous Lilland, Alana, DO 125 mL/hr at 04/30/20 0528 Infusion Verify at 04/30/20 0528  . oxyCODONE (Oxy  IR/ROXICODONE) immediate release tablet 2.5 mg  2.5 mg Oral Q4H PRN Lilland, Alana, DO      . rosuvastatin (CRESTOR) tablet 40 mg  40 mg Oral q1800 Lilland, Alana, DO   40 mg at 04/29/20 1705     Discharge Medications: Please see discharge summary for a list of discharge medications.  Relevant Imaging Results:  Relevant Lab Results:   Additional Information SSN [facility] [place]

## 2020-04-30 NOTE — Progress Notes (Signed)
Inpatient Rehab Admissions Coordinator Note:   Per OT recommendations, pt was screened for CIR candidacy by Shann Medal, PT, DPT.  At this time note PT recommending SNF and pt total +2 for limited mobility.  Feel SNF more appropriate at this time, given tolerance; however, I will follow along from a distance and if patient improves her mobility we could consider CIR if she remains in house.  Please contact me with questions.   Shann Medal, PT, DPT [phone] 04/30/20 11:49 AM

## 2020-04-30 NOTE — Progress Notes (Addendum)
Family Medicine Teaching Service Daily Progress Note Intern Pager: [phone]  Patient name: Michelle Abbott Medical record number: [id number] Date of birth: Aug 22, 1945 Age: 74 y.o. Gender: female  Primary Care Provider: Gladys Damme, MD Consultants: Neurosurgery Code Status: FULL  Pt Overview and Major Events to Date:  Admitted 12/12  Assessment and Plan: Michelle Abbott a 74 y.o.femalewho presented with inability to stand s/p recent back surgery on 12/10, and slow slurred speech thought to be due to pain medication. Admission labs with AKI in the setting of decreased PO intake. PMH is significant for sciatica, T2DM, CKD, gout, HTN, anemia, OA, HDL, lumbar spinal stenosis s/p PILF.  Acute kidney injury CKD stageIV Improving. Cr 3.54>3.01. Baseline appears to be around 2.05over the last year. - Vitals per routine - DailyRFP - Avoid nephrotoxic agentsas able - Strict Is&Os - Continue holding chlorthalidone, losartan, allopurinol, colchicine - mIVF 160mL/hr LR - encourage PO fluid intake  Acute on chronic normocytic anemia Afternoon H&H yesterday 5.8. Received 1U PRBC, and AM hemoglobin has improved to 7.8. Baseline appears to be around 9-10. Iron panel with elevated ferritin 954, though it appears lab was drawn during blood transfusion which would interfere with results. Additionally, ferritin could also be elevated in the setting of recent surgery. Currently takesferrous sulfate 325 mgdaily. Appears that patient seen in 6/21 for rectal bleeding. Was apparently supposed to follow up with GI, unsure if patient has.  - Daily CBC - Will add Protonix 80 mg qd for GI ppx - FOBT - GI consult if she worsens or Hgb continues to downtrend   Back pain Lumbar spinal stenosis s/p posterior lumbar interbody fusion (PILF) On 04/26/20, patient underwent posterior lumbar interbody fusion without complications. D/c on 12/11 with oxycodone 10mg  q4h. Due to concern for side effects  from pain medication, have decreased dose to 2.5mg  oxycodone. PT recommending SNF placement. -F/u Neurosurgery recs - Oxycodone 2.5mg  q4h PRN - Tylenol 650mg  q6h - PT/OT  - SW to assist with SNF placement   Altered mentation in the setting of recent pain meds: resolved  CT head negative for acute findings.  Speech improved and without obvious slurring of her words today.  A&O x4.  HTN BP increasing since midnight, ranging 164-178/61-68. In the setting of home chlorthalidone and losartan being held.  - Continue Carvedilol 25 mg BID  - Vitals per routine - Hold chlorthalidone, losartan - Will start Amlodipine 5 mg    T2DM Fasting BG 97 this AM. A1C on 8/12 was 7.9%. Home medications include Lantus 40U, ozempic(on Wednesdays). - sSSI - CBGs TID AC and QHS - Hold lantus due to decrease oral intake, holding ozempic  Mild leukocytosis: resolved WBC downtrending and normal 11.5>8.7>8.9>7.2. Elevation was likely 2/2 recent surgery and dehydration given AKI and decreased PO intake since recent surgery.  - Monitor with CBC - Monitor for fever - Monitorsurgicalwound site for signs of infection  HLD: chronic, stable Last lipid panel 12/28/19, normal. Home medications include Rosuvastatin 40 mg daily. - Continue home rosuvastatin   FEN/GI: Renal/carb modified PPx: Lovenox  Status is: Inpatient  Remains inpatient appropriate because:Unsafe d/c plan and IV treatments appropriate due to intensity of illness or inability to take PO   Dispo: The patient is from: Home              Anticipated d/c is to: SNF              Anticipated d/c date is: 2 days  Patient currently is not medically stable to d/c.   Subjective:  Patient feels a little better this morning.  Still feels weak in her legs, right leg worse than the left.  Denies any abdominal pain, chest pain, shortness of breath, headache or vision changes.  Objective: Temp:  [98.6 F (37 C)-99.4 F (37.4 C)]  98.9 F (37.2 C) (12/14 0426) Pulse Rate:  [80-101] 90 (12/14 0426) Resp:  [13-22] 14 (12/14 0426) BP: (131-189)/(45-88) 189/88 (12/14 0426) SpO2:  [93 %-100 %] 100 % (12/14 0426) Weight:  [116.4 kg] 116.4 kg (12/14 0423) Physical Exam: General: Awake, in no distress, pleasant Cardiovascular: Distant heart sounds 2/2 body habitus, RRR Respiratory: CTAB without wheezing/rhonchi/rales  Abdomen: obese, non-tender in all quadrants, no R/G Extremities: Able to move extremities voluntarily and on command  Laboratory: Recent Labs  Lab 04/28/20 1508 04/29/20 0234 04/29/20 0650 04/29/20 1617  WBC 11.5* 8.7 8.9  --   HGB 7.5* 6.7* 7.0* 5.8*  HCT 25.4* 22.1* 23.1* 19.2*  PLT 212 183 172  --    Recent Labs  Lab 04/24/20 1100 04/28/20 1508 04/29/20 0234  NA 140 137 137  K 3.4* 4.3 3.9  CL 105 106 106  CO2 24 20* 21*  BUN 46* 56* 56*  CREATININE 2.58* 4.03* 3.54*  CALCIUM 8.9 8.6* 8.5*  GLUCOSE 136* 185* 122*    Imaging/Diagnostic Tests: CT Head 12/13 IMPRESSION: 1. No acute intracranial abnormalities. 2. Chronic small vessel ischemic change and brain atrophy.  Sharion Settler, DO 04/30/2020, 6:34 AM PGY-1, [place] Intern pager: [phone], text pages welcome

## 2020-05-01 LAB — BASIC METABOLIC PANEL
Anion gap: 12 (ref 5–15)
BUN: 41 mg/dL — ABNORMAL HIGH (ref 8–23)
CO2: 22 mmol/L (ref 22–32)
Calcium: 8.5 mg/dL — ABNORMAL LOW (ref 8.9–10.3)
Chloride: 105 mmol/L (ref 98–111)
Creatinine, Ser: 2.49 mg/dL — ABNORMAL HIGH (ref 0.44–1.00)
GFR, Estimated: 20 mL/min — ABNORMAL LOW (ref >60)
Glucose, Bld: 191 mg/dL — ABNORMAL HIGH (ref 70–99)
Potassium: 3.7 mmol/L (ref 3.5–5.1)
Sodium: 139 mmol/L (ref 135–145)

## 2020-05-01 LAB — GLUCOSE, CAPILLARY
Glucose-Capillary: 155 mg/dL — ABNORMAL HIGH (ref 70–99)
Glucose-Capillary: 163 mg/dL — ABNORMAL HIGH (ref 70–99)
Glucose-Capillary: 154 mg/dL — ABNORMAL HIGH (ref 70–99)
Glucose-Capillary: 164 mg/dL — ABNORMAL HIGH (ref 70–99)

## 2020-05-01 LAB — CBC
HCT: 23 % — ABNORMAL LOW (ref 36.0–46.0)
Hemoglobin: 7.4 g/dL — ABNORMAL LOW (ref 12.0–15.0)
MCH: 27.5 pg (ref 26.0–34.0)
MCHC: 32.2 g/dL (ref 30.0–36.0)
MCV: 85.5 fL (ref 80.0–100.0)
Platelets: 205 10*3/uL (ref 150–400)
RBC: 2.69 MIL/uL — ABNORMAL LOW (ref 3.87–5.11)
RDW: 14.7 % (ref 11.5–15.5)
WBC: 6.8 10*3/uL (ref 4.0–10.5)
nRBC: 0 % (ref 0.0–0.2)

## 2020-05-01 LAB — SARS CORONAVIRUS 2 BY RT PCR (HOSPITAL ORDER, PERFORMED IN ~~LOC~~ HOSPITAL LAB): SARS Coronavirus 2: NEGATIVE

## 2020-05-01 MED ORDER — INSULIN GLARGINE 100 UNIT/ML ~~LOC~~ SOLN
10.0000 [IU] | Freq: Every day | SUBCUTANEOUS | Status: DC
  Administered 2020-05-01 – 2020-05-02 (×2): 10 [IU] via SUBCUTANEOUS
  Filled 2020-05-01 (×2): qty 0.1

## 2020-05-01 NOTE — Progress Notes (Signed)
Family Medicine Teaching Service Daily Progress Note Intern Pager: [phone]  Patient name: Michelle Abbott Medical record number: [id number] Date of birth: [birth year] Age: 74 y.o. Gender: female  Primary Care Provider: Gladys Damme, MD Consultants: Neurosurgery Code Status: FULL   Pt Overview and Major Events to Date:  Admitted 12/12  Assessment and Plan: Michelle Abbott a 74 y.o.femalewho presentedwith inability to stand s/precentback surgery on 12/10,and slow slurred speech thought to be due to pain medication.Admission labs with AKI in the setting of decreased PO intake.PMH is significant for sciatica, T2DM, CKD, gout, HTN, anemia, OA, HDL, lumbar spinal stenosis s/p PILF.  Acute kidney injury CKD stageIV: stable, improving Continues to improve. Cr 3.54>3.01>2.49.Baseline appears to be around 2.05over the last year. - Vitals per routine -DailyRFP - Avoid nephrotoxic agentsas able - Strict Is&Os -Continue holdingchlorthalidone, losartan, allopurinol, colchicine - mIVF 155mL/hr LR - encourage PO fluid intake  Acute on chronic normocytic anemia: stable Hgb relatively stable at 7.4 from 7.8 yesterday. Post-op hemoglobin was 7.5. Takesferrous sulfate 325 mgdaily. FOBT negative on 12/12. On Protonix 80 mg qd for GI ppx.  - Daily CBC - Continue Protonix 80 mg qd  - Repeat FOBT - GI consult if symptomatically worsens or Hgb continues to downtrend   Back pain Lumbar spinal stenosis s/p posterior lumbar interbody fusion (PILF) On 04/26/20, patient underwent posterior lumbar interbody fusion without complications.D/con 12/11 with oxycodone 10mg  q4h. Due to concern for side effects from pain medication, have decreased doseto 2.5mg  oxycodone PRN, though patient has not required.SNF vs CIR placement.  - Tylenol 650mg  q6h - Oxycodone 2.5mg  q4h PRN - PT/OT  - SW to assist with placement   Altered mentation in the setting of recent pain meds: resolved   CT head negative, speech continues to be improved and without obvious slurring.   HTN  BP continue to be elevated and ranging 149-166/56-69 in the last 24 hours. Amlodipine 5 mg started yesterday. Onset of Amlodipine is 24-96 hours. This is also in the setting of home chlorthalidone and losartan being held due to AKI.  - Continue Carvedilol 25 mg BID - Continue Amlodipine 5 mg  - Vitals per routine - Hold chlorthalidone, losartan   T2DM Fasting BG 155 this AM. CBG's ranging 97-220 in last 24 hours. Her Lantus was initially held due to decreased PO intake prior to arrival. Patient appears to be eating 50-60% of meals. A1C on 8/12 was 7.9%. Home medications include Lantus 40U, ozempic(on Wednesdays). - Start Lantus 10U - sSSI - CBGs TID AC and QHS - Hold ozempic  HLD: chronic, stable Last lipid panel 12/28/19, normal. Home medications include Rosuvastatin 40 mg daily. - Continue home rosuvastatin  FEN/GI:Renal/carb modified PPx: SCD's   Status is: Inpatient  Remains inpatient appropriate because:Unsafe d/c plan   Dispo: The patient is from: Home              Anticipated d/c is to: SNF vs CIR              Anticipated d/c date is: 1 day              Patient currently is medically stable to d/c.  Subjective:  Patient feels better this morning. Still having some achy pain in her right leg. Denies any back pain. Speech is improved.  Objective: Temp:  [97.9 F (36.6 C)-99.5 F (37.5 C)] 98.9 F (37.2 C) (12/15 0452) Pulse Rate:  [78-90] 78 (12/15 0452) Resp:  [17-21] 18 (12/15 0452) BP: (149-166)/(56-69) 157/60 (12/15 0452)  SpO2:  [97 %-100 %] 98 % (12/15 0452) Weight:  [241 kg] 117 kg (12/15 0016) Physical Exam: General: NAD, pleasant Cardiovascular: RRR Respiratory: CTAB anterior fields Abdomen: obese, non-tender in all quadrants, no R/G, normoactive bowel sounds Extremities: 2+ DP and radial pulses, SCD's placed on legs   Laboratory: Recent Labs  Lab  04/29/20 0650 04/29/20 1617 04/30/20 0612 05/01/20 0308  WBC 8.9  --  7.2 6.8  HGB 7.0* 5.8* 7.8* 7.4*  HCT 23.1* 19.2* 25.3* 23.0*  PLT 172  --  186 205   Recent Labs  Lab 04/29/20 0234 04/30/20 0612 05/01/20 0308  NA 137 140 139  K 3.9 3.7 3.7  CL 106 108 105  CO2 21* 20* 22  BUN 56* 48* 41*  CREATININE 3.54* 3.01* 2.49*  CALCIUM 8.5* 8.6* 8.5*  GLUCOSE 122* 105* 191*    Imaging/Diagnostic Tests: None new.   Sharion Settler, DO 05/01/2020, 5:57 AM PGY-1, [place] Intern pager: [phone], text pages welcome

## 2020-05-01 NOTE — TOC Progression Note (Signed)
Transition of Care [facility]) - Progression Note    Patient Details  Name: Michelle Abbott MRN: [id number] Date of Birth: 1945-08-19  Transition of Care [facility]) CM/SW [place], [place] Phone Number: 05/01/2020, 9:45 AM  Clinical Narrative:    CIR is recommending SNF at this time. CSW met with pt and presented offers. Pt leaning towards [facility] but wants to discuss further with pt daughter. CSW called pt daughter Michelle Abbott and discussed offers. Daughter also leaning towards [facility] and plans to discuss further with pt.   CSW explained possible d/c tomorrow and insurance auth process.   Expected Discharge Plan: Skilled Nursing Facility Barriers to Discharge: SNF Pending bed offer  Expected Discharge Plan and Services Expected Discharge Plan: [place] arrangements for the past 2 months: Apartment                                       Social Determinants of Health (SDOH) Interventions    Readmission Risk Interventions No flowsheet data found.

## 2020-05-01 NOTE — Progress Notes (Signed)
Subjective: Patient reports Patient doing better improved back pain improved right leg pain  Objective: Vital signs in last 24 hours: Temp:  [98 F (36.7 C)-99 F (37.2 C)] 98.6 F (37 C) (12/15 0711) Pulse Rate:  [78-90] 80 (12/15 0711) Resp:  [18-20] 20 (12/15 0711) BP: (149-159)/(56-68) 159/65 (12/15 0711) SpO2:  [98 %-100 %] 100 % (12/15 0711) Weight:  [458 kg] 117 kg (12/15 0016)  Intake/Output from previous day: 12/14 0701 - 12/15 0700 In: 2903 [P.O.:240; I.V.:2663] Out: 2050 [Urine:2050] Intake/Output this shift: No intake/output data recorded.  Strength 5-5 lower extremities bilaterally incision clean dry and intact  Lab Results: Recent Labs    04/30/20 0612 05/01/20 0308  WBC 7.2 6.8  HGB 7.8* 7.4*  HCT 25.3* 23.0*  PLT 186 205   BMET Recent Labs    04/30/20 0612 05/01/20 0308  NA 140 139  K 3.7 3.7  CL 108 105  CO2 20* 22  GLUCOSE 105* 191*  BUN 48* 41*  CREATININE 3.01* 2.49*  CALCIUM 8.6* 8.5*    Studies/Results: CT HEAD WO CONTRAST  Result Date: 04/29/2020 CLINICAL DATA:  Neurologic deficit. Weakness for several days with increase confusion. EXAM: CT HEAD WITHOUT CONTRAST TECHNIQUE: Contiguous axial images were obtained from the base of the skull through the vertex without intravenous contrast. COMPARISON:  None. FINDINGS: Brain: No evidence of acute infarction, hemorrhage, hydrocephalus, extra-axial collection or mass lesion/mass effect. There is mild diffuse low-attenuation within the subcortical and periventricular white matter compatible with chronic microvascular disease. Prominence of the sulci and ventricles noted compatible with brain atrophy. Vascular: No hyperdense vessel or unexpected calcification. Skull: Normal. Negative for fracture or focal lesion. Sinuses/Orbits: No acute finding. Other: None IMPRESSION: 1. No acute intracranial abnormalities. 2. Chronic small vessel ischemic change and brain atrophy. Electronically Signed   By: Kerby Moors M.D.   On: 04/29/2020 11:34   CT LUMBAR SPINE WO CONTRAST  Result Date: 04/29/2020 CLINICAL DATA:  Weakness. Low back pain. Difficulty walking. Previous fusion. EXAM: CT LUMBAR SPINE WITHOUT CONTRAST TECHNIQUE: Multidetector CT imaging of the lumbar spine was performed without intravenous contrast administration. Multiplanar CT image reconstructions were also generated. COMPARISON:  Radiography 04/26/2020.  MRI 09/25/2019. FINDINGS: Segmentation: 5 lumbar type vertebral bodies. Alignment: Normal Vertebrae: Prominent bridging osteophytes from the lower thoracic region through L2. Possible diffuse idiopathic skeletal hyperostosis. Paraspinal and other soft tissues: Aortic atherosclerosis. Otherwise negative. Disc levels: No significant disc level pathology from T11-12 through L1-2. L2-3: Bulging of the disc. Facet and ligamentous prominence. Mild to moderate canal stenosis at this level, right more than left. L3 through L5: Previous posterior decompression, diskectomy and fusion procedure. Interbody spacers appear well position. Pedicle screws and posterior rods appear well positioned and intact. No evidence of loosening/nonunion. L5-S1: Chronic disc degeneration with loss of disc height, endplate osteophytes and bulging of the disc. Facet and ligamentous degeneration and hypertrophy. Question fusion or near fusion of the facets on the right. Central canal sufficiently patent. Narrowing of the subarticular lateral recesses and both neural foramina that could have some potential for neural compression. IMPRESSION: 1. Prominent bridging osteophytes from the lower thoracic region through L2. Possible diffuse idiopathic skeletal hyperostosis. 2. Previous posterior decompression, diskectomy and fusion procedure from L3 through L5. No evidence of loosening/nonunion at the L3-4 level. Good appearance with apparent sufficient patency of the canal and foramina. 3. L2-3: Bulging of the disc. Facet and ligamentous  prominence. Mild to moderate canal stenosis, right more than left. 4. L5-S1: Chronic disc  degeneration with loss of disc height, endplate osteophytes and bulging of the disc. Facet and ligamentous degeneration and hypertrophy. Question fusion or near fusion of the facets on the right. Central canal sufficiently patent. Narrowing of the subarticular lateral recesses and neural foramina that could have some potential for neural compression. Aortic Atherosclerosis (ICD10-I70.0). Electronically Signed   By: Nelson Chimes M.D.   On: 04/29/2020 11:34    Assessment/Plan: Patient doing well CT scan yesterday showed good alignment and positioning of the implants no evidence of complicating feature.  Mobilize with physical occupational therapy agree with placement appreciate medicine's help and managing her chronic disease  LOS: 2 days     Elaina Hoops 05/01/2020, 11:24 AM

## 2020-05-02 DIAGNOSIS — M2550 Pain in unspecified joint: Secondary | ICD-10-CM | POA: Diagnosis not present

## 2020-05-02 DIAGNOSIS — E119 Type 2 diabetes mellitus without complications: Secondary | ICD-10-CM | POA: Diagnosis not present

## 2020-05-02 DIAGNOSIS — G8918 Other acute postprocedural pain: Secondary | ICD-10-CM | POA: Diagnosis not present

## 2020-05-02 DIAGNOSIS — M6281 Muscle weakness (generalized): Secondary | ICD-10-CM | POA: Diagnosis not present

## 2020-05-02 DIAGNOSIS — R6889 Other general symptoms and signs: Secondary | ICD-10-CM | POA: Diagnosis not present

## 2020-05-02 DIAGNOSIS — M5430 Sciatica, unspecified side: Secondary | ICD-10-CM | POA: Diagnosis not present

## 2020-05-02 DIAGNOSIS — Z7401 Bed confinement status: Secondary | ICD-10-CM | POA: Diagnosis not present

## 2020-05-02 DIAGNOSIS — M4326 Fusion of spine, lumbar region: Secondary | ICD-10-CM | POA: Diagnosis not present

## 2020-05-02 DIAGNOSIS — E559 Vitamin D deficiency, unspecified: Secondary | ICD-10-CM | POA: Diagnosis not present

## 2020-05-02 DIAGNOSIS — E1140 Type 2 diabetes mellitus with diabetic neuropathy, unspecified: Secondary | ICD-10-CM | POA: Diagnosis not present

## 2020-05-02 DIAGNOSIS — Z789 Other specified health status: Secondary | ICD-10-CM | POA: Diagnosis not present

## 2020-05-02 DIAGNOSIS — N179 Acute kidney failure, unspecified: Secondary | ICD-10-CM | POA: Diagnosis not present

## 2020-05-02 DIAGNOSIS — R410 Disorientation, unspecified: Secondary | ICD-10-CM | POA: Diagnosis not present

## 2020-05-02 DIAGNOSIS — M48061 Spinal stenosis, lumbar region without neurogenic claudication: Secondary | ICD-10-CM | POA: Diagnosis not present

## 2020-05-02 DIAGNOSIS — Z743 Need for continuous supervision: Secondary | ICD-10-CM | POA: Diagnosis not present

## 2020-05-02 DIAGNOSIS — M1990 Unspecified osteoarthritis, unspecified site: Secondary | ICD-10-CM | POA: Diagnosis not present

## 2020-05-02 DIAGNOSIS — Y929 Unspecified place or not applicable: Secondary | ICD-10-CM | POA: Diagnosis not present

## 2020-05-02 DIAGNOSIS — Z794 Long term (current) use of insulin: Secondary | ICD-10-CM | POA: Diagnosis not present

## 2020-05-02 DIAGNOSIS — R531 Weakness: Secondary | ICD-10-CM | POA: Diagnosis not present

## 2020-05-02 DIAGNOSIS — D649 Anemia, unspecified: Secondary | ICD-10-CM | POA: Diagnosis not present

## 2020-05-02 DIAGNOSIS — Z23 Encounter for immunization: Secondary | ICD-10-CM | POA: Diagnosis not present

## 2020-05-02 DIAGNOSIS — I10 Essential (primary) hypertension: Secondary | ICD-10-CM

## 2020-05-02 DIAGNOSIS — N189 Chronic kidney disease, unspecified: Secondary | ICD-10-CM | POA: Diagnosis not present

## 2020-05-02 DIAGNOSIS — E785 Hyperlipidemia, unspecified: Secondary | ICD-10-CM | POA: Diagnosis not present

## 2020-05-02 DIAGNOSIS — R2689 Other abnormalities of gait and mobility: Secondary | ICD-10-CM | POA: Diagnosis not present

## 2020-05-02 DIAGNOSIS — H9110 Presbycusis, unspecified ear: Secondary | ICD-10-CM | POA: Diagnosis not present

## 2020-05-02 DIAGNOSIS — R278 Other lack of coordination: Secondary | ICD-10-CM | POA: Diagnosis not present

## 2020-05-02 DIAGNOSIS — M109 Gout, unspecified: Secondary | ICD-10-CM | POA: Diagnosis not present

## 2020-05-02 LAB — CBC
HCT: 24.7 % — ABNORMAL LOW (ref 36.0–46.0)
Hemoglobin: 7.6 g/dL — ABNORMAL LOW (ref 12.0–15.0)
MCH: 26.8 pg (ref 26.0–34.0)
MCHC: 30.8 g/dL (ref 30.0–36.0)
MCV: 87 fL (ref 80.0–100.0)
Platelets: 242 10*3/uL (ref 150–400)
RBC: 2.84 MIL/uL — ABNORMAL LOW (ref 3.87–5.11)
RDW: 14.8 % (ref 11.5–15.5)
WBC: 6.3 10*3/uL (ref 4.0–10.5)
nRBC: 0 % (ref 0.0–0.2)

## 2020-05-02 LAB — BASIC METABOLIC PANEL
Anion gap: 9 (ref 5–15)
BUN: 34 mg/dL — ABNORMAL HIGH (ref 8–23)
CO2: 24 mmol/L (ref 22–32)
Calcium: 8.6 mg/dL — ABNORMAL LOW (ref 8.9–10.3)
Chloride: 105 mmol/L (ref 98–111)
Creatinine, Ser: 2.49 mg/dL — ABNORMAL HIGH (ref 0.44–1.00)
GFR, Estimated: 20 mL/min — ABNORMAL LOW (ref >60)
Glucose, Bld: 151 mg/dL — ABNORMAL HIGH (ref 70–99)
Potassium: 3.8 mmol/L (ref 3.5–5.1)
Sodium: 138 mmol/L (ref 135–145)

## 2020-05-02 LAB — GLUCOSE, CAPILLARY
Glucose-Capillary: 145 mg/dL — ABNORMAL HIGH (ref 70–99)
Glucose-Capillary: 163 mg/dL — ABNORMAL HIGH (ref 70–99)

## 2020-05-02 MED ORDER — ACETAMINOPHEN ER 650 MG PO TBCR: 650.0000 mg | EXTENDED_RELEASE_TABLET | Freq: Three times a day (TID) | ORAL | 1 refills | Status: AC | PRN

## 2020-05-02 MED ORDER — LOSARTAN POTASSIUM 100 MG PO TABS: 50.0000 mg | ORAL_TABLET | Freq: Every day | ORAL | 3 refills | Status: DC

## 2020-05-02 NOTE — Discharge Summary (Addendum)
[facility] Discharge Summary  Patient name: Michelle Abbott Medical record number: [id number] Date of birth: March 24, 1946 Age: 74 y.o. Gender: female Date of Admission: 04/28/2020  Date of Discharge: 05/02/20 Admitting Physician: Blane Ohara McDiarmid, MD  Primary Care Provider: Gladys Damme, MD Consultants: Neurosurgery  Indication for Hospitalization: AKI on CKD Stage IV  Discharge Diagnoses/Problem List:  AKI on CKD Stage IV AMS in setting of opioids S/p posterior lumbar interbody fusion Acute on chronic normocytic anemia Hypertension Type 2 diabetes mellitus Hyperlipidemia  Disposition: SNF   Discharge Condition: Stable  Discharge Exam:  Blood pressure (!) 162/62, pulse 84, temperature 98.4 F (36.9 C), temperature source Oral, resp. rate 16, height 5\' 3"  (1.6 m), weight 119.2 kg, SpO2 99 %. Gen: awake, alert, in no distress, pleasant, speech normal and without slurring  Cardio: RRR, without murmur, 2+ DP and radial pulses Resp: CTAB without wheezing/rhonchi/rales MSK: moving all extremities spontaneously and on command, decreased hip flexion right > left.   Brief Hospital Course:  Michelle Abbott is a 74 y.o. female who presented with inability to stand s/p back surgery (12/10) and slow, slurred speech in the setting of opioid medication. Additionally found to have an AKI on admission labs. PMH is significant for sciatica, T2DM, CKD, gout, HTN, anemia, OA, HDL, lumbar spinal stenosis s/p PILF. Below is her hospital course listed by problem.   Acute kidney injury on CKD stage IV Presumed to be secondary to dehydration and decreased p.o. intake. Cr 4.03, BUN 56, GFR 11 on admission. Started on lactated Ringer's.  BMP was trended and improved to 2.49 on discharge. Of note, patients pre-surgery creatinine was 2.58 on 04/24/20.   Slurred Speech in the setting of opioids  Lumbar spinal stenosis s/p posterior lumbar interbody fusion (PILF) Patient  presented with slow and slurred speech in the setting of recent opioid intake from surgery.  Neuro exam generally unremarkable and CT head negative for acute findings.  Opioid pain regimen was decreased from 10 mg Oxy to 2.5 mg.  Standing Tylenol was ordered.  Neurosurgery consulted and recommended CT lumbar spine which was not suggestive of complications from surgery.  Evaluated by physical therapy and Occupational Therapy who recommended SNF placement for patient. Slurred speech self resolved.   Acute on chronic normocytic anemia Presurgery Hgb of 9.5 on 12/8.  Hgb 7.5 on admission, 2 days after surgery.  Patient required 1U PRBC due to drop to Hgb 5.8 on 12/13. Hgb improved to 7.8 s/p transfusion.  Iron studies drawn and notable for elevated ferritin to 954 but thought to be likely related to her recent surgery. FOBT negative on admission. No concern for active bleed as hgb remained stable. D/c with Hgb 7.6  Hypertension Due to AKI, patients home losartan and chlorthalidone were held. She was continued on her home carvedilol 25 mg BID. Due to consistently elevated BP's to the 245'Y systolic, patient was started on Amlodipine 5 mg BID.  Patient was discharged back on her home medications.   Other problems chronic and stable.   DC recommendations:  1. Repeat CBC (for anemia) 2. Repeat BMP (d/t AKI) 3. Consider referral to GI if persistent drop in Hb. Pt has history of GI bleed.  4. Pain management with tylenol on d/c 5. Recheck BP. Held anti-hypertensives in hospital, restarting on discharge but reduced Losartan 50mg  due to AKI    Significant Procedures: None  Significant Labs and Imaging:  Recent Labs  Lab 04/30/20 0612 05/01/20 0308 05/02/20 0228  WBC  7.2 6.8 6.3  HGB 7.8* 7.4* 7.6*  HCT 25.3* 23.0* 24.7*  PLT 186 205 242   Recent Labs  Lab 04/28/20 1508 04/29/20 0234 04/30/20 0612 05/01/20 0308 05/02/20 0228  NA 137 137 140 139 138  K 4.3 3.9 3.7 3.7 3.8  CL 106 106 108  105 105  CO2 20* 21* 20* 22 24  GLUCOSE 185* 122* 105* 191* 151*  BUN 56* 56* 48* 41* 34*  CREATININE 4.03* 3.54* 3.01* 2.49* 2.49*  CALCIUM 8.6* 8.5* 8.6* 8.5* 8.6*  PHOS  --  3.5  --   --   --   ALBUMIN  --  2.3*  --   --   --    Chest X-ray 12/12 IMPRESSION: No evidence of active disease in the chest.  CT Head without contrast 12/13 IMPRESSION: 1. No acute intracranial abnormalities. 2. Chronic small vessel ischemic change and brain atrophy.  CT Lumbar Spine 12/13 IMPRESSION: 1. Prominent bridging osteophytes from the lower thoracic region through L2. Possible diffuse idiopathic skeletal hyperostosis. 2. Previous posterior decompression, diskectomy and fusion procedure from L3 through L5. No evidence of loosening/nonunion at the L3-4 level. Good appearance with apparent sufficient patency of the canal and foramina. 3. L2-3: Bulging of the disc. Facet and ligamentous prominence. Mild to moderate canal stenosis, right more than left. 4. L5-S1: Chronic disc degeneration with loss of disc height, endplate osteophytes and bulging of the disc. Facet and ligamentous degeneration and hypertrophy. Question fusion or near fusion of the facets on the right. Central canal sufficiently patent. Narrowing of the subarticular lateral recesses and neural foramina that could have some potential for neural compression.  Results/Tests Pending at Time of Discharge: None  Discharge Medications:  Allergies as of 05/02/2020      Reactions   Ciprofloxacin Hcl Other (See Comments)   Burning at IV site   Metformin And Related Other (See Comments)   CKD    Ace Inhibitors Cough   Lisinopril   Amlodipine Swelling   Lower leg swelling.    Baclofen Nausea Only, Other (See Comments)   Disoriented, dizziness, weakness, fatigue.       Medication List    STOP taking these medications   Oxycodone HCl 10 MG Tabs     TAKE these medications   Accu-Chek FastClix Lancets Misc TEST BLOOD SUGAR  FOUR TIMES DAILY   acetaminophen 650 MG CR tablet Commonly known as: Tylenol 8 Hour Take 1 tablet (650 mg total) by mouth every 8 (eight) hours as needed for pain.   allopurinol 100 MG tablet Commonly known as: ZYLOPRIM TAKE 1 TABLET BY MOUTH  DAILY   aspirin EC 81 MG tablet Take 81 mg by mouth daily. Swallow whole.   B-D ULTRAFINE III SHORT PEN 31G X 8 MM Misc Generic drug: Insulin Pen Needle Use to give insulin twice a day and victoza once a day. Dx code = E11.9   carvedilol 25 MG tablet Commonly known as: COREG Take 25 mg by mouth 2 (two) times daily with a meal.   chlorthalidone 25 MG tablet Commonly known as: HYGROTON TAKE 1 TABLET BY MOUTH  DAILY   cholecalciferol 1000 units tablet Commonly known as: VITAMIN D Take 1,000 Units by mouth daily.   colchicine 0.6 MG tablet Take 2 tablets (1.2 mg total) at onset of gout flare then take 1 tablet (0.6 mg) one hour later if symptoms persist. (MAX: 1.8 mg total daily) What changed:   how much to take  how to take  this  when to take this   ferrous sulfate 325 (65 FE) MG tablet Take 325 mg by mouth daily.   Lantus SoloStar 100 UNIT/ML Solostar Pen Generic drug: insulin glargine Inject 40 Units into the skin daily. Increase as instructed. Max dose 50 units. What changed:   how much to take  when to take this   losartan 100 MG tablet Commonly known as: COZAAR Take 0.5 tablets (50 mg total) by mouth daily. What changed: how much to take   multivitamin with minerals Tabs tablet Take 1 tablet by mouth daily.   OneTouch Ultra test strip Generic drug: glucose blood 1 each by Other route daily.   Ozempic (0.25 or 0.5 MG/DOSE) 2 MG/1.5ML Sopn Generic drug: Semaglutide(0.25 or 0.5MG /DOS) INJECT 0.5 MG INTO THE SKIN WEEKLY What changed: See the new instructions.   polyethylene glycol powder 17 GM/SCOOP powder Commonly known as: GLYCOLAX/MIRALAX Take 17 g by mouth 2 (two) times daily as needed. What changed:  reasons to take this   rosuvastatin 40 MG tablet Commonly known as: CRESTOR TAKE 1 TABLET BY MOUTH  DAILY   vitamin B-12 1000 MCG tablet Commonly known as: CYANOCOBALAMIN Take 1,000 mcg by mouth daily.            Durable Medical Equipment  (From admission, onward)         Start     Ordered   04/30/20 1148  For home use only DME 3 n 1  Once        04/30/20 1147          Discharge Instructions: Please refer to Patient Instructions section of EMR for full details.  Patient was counseled important signs and symptoms that should prompt return to medical care, changes in medications, dietary instructions, activity restrictions, and follow up appointments.   Follow-Up Appointments: None   Sharion Settler, DO 05/02/2020, 10:55 AM PGY-1, [facility]  FPTS Upper-Level Resident Addendum   I have independently interviewed and examined the patient. I have discussed the above with the original author and agree with their documentation. My edits for correction/addition/clarification have been made.  [place] Constable, D.O. PGY-3, [facility] 05/02/2020 1:46 PM

## 2020-05-02 NOTE — Plan of Care (Signed)
  Problem: Clinical Measurements: Goal: Ability to maintain clinical measurements within normal limits will improve Outcome: Progressing   Problem: Clinical Measurements: Goal: Will remain free from infection Outcome: Progressing   

## 2020-05-02 NOTE — TOC Transition Note (Signed)
Transition of Care [facility]) - CM/SW Discharge Note   Patient Details  Name: Michelle Abbott MRN: [id number] Date of Birth: 1946-03-25  Transition of Care [facility]) CM/SW Contact:  Bethann Berkshire, [place] Phone Number: 05/02/2020, 11:47 AM   Clinical Narrative:     Patient will DC to: [facility] Anticipated DC date: 05/02/20 Family notified: Alinna, Siple (Daughter)  [phone] (Mobile) Transport by: Corey Harold   Per MD patient ready for DC to [facility]. RN, patient, patient's family, and facility notified of DC. Discharge Summary and FL2 sent to facility. RN to call report prior to discharge ([phone]). DC packet on chart. Ambulance transport requested for patient.   CSW will sign off for now as social work intervention is no longer needed. Please consult [place] again if new needs arise.   Final next level of care: Skilled Nursing Facility Barriers to Discharge: No Barriers Identified   Patient Goals and CMS Choice Patient states their goals for this hospitalization and ongoing recovery are:: Rehab CMS Medicare.gov Compare Post Acute Care list provided to:: Patient Choice offered to / list presented to : Patient  Discharge Placement              Patient chooses bed at:  ([facility]) Patient to be transferred to facility by: [facility] Name of family member notified: Aslynn, Brunetti (Daughter)   [phone] (Mobile) Patient and family notified of of transfer: 05/02/20  Discharge Plan and Services                                     Social Determinants of Health (SDOH) Interventions     Readmission Risk Interventions No flowsheet data found.

## 2020-05-02 NOTE — Progress Notes (Signed)
Physical Therapy Treatment Patient Details Name: Michelle Abbott MRN: [id number] DOB: 22-Mar-1946 Today's Date: 05/02/2020    History of Present Illness Pt is a 74 y/o female admitted secondary to increased weakness and confusion. Pt is recently s/p L3-5 PLIF on 12/10. PMH includes DM, HTN, and CKD.    PT Comments    Pt fully participated in session. Pt with improved mobility only requiring 1 assist. Pt requiring increased time during all functional mobility tasks with increased cueing. Pt continues to demonstrate deficits in balance, strength, coordination, gait, safety and endurance and will benefit from skilled PT to address deficits to maximize independence with functional mobility prior to discharge.     Follow Up Recommendations  SNF     Equipment Recommendations  Wheelchair cushion (measurements PT);Wheelchair (measurements PT)    Recommendations for Other Services       Precautions / Restrictions Precautions Precautions: Back Precaution Booklet Issued: No Precaution Comments: Verbally reviewed back precautions. Required Braces or Orthoses: Spinal Brace Spinal Brace: Lumbar corset;Applied in sitting position Restrictions Weight Bearing Restrictions: No    Mobility  Bed Mobility Overal bed mobility: Needs Assistance Bed Mobility: Supine to Sit     Supine to sit: Min assist;HOB elevated     General bed mobility comments: increased time to process commands  Transfers Overall transfer level: Needs assistance Equipment used: Rolling walker (2 wheeled) Transfers: Sit to/from [facility] Sit to Stand: Min assist Stand pivot transfers: Min assist       General transfer comment: Pt pulling up on walker despite max cueing, took increased time/effort to problem solve and initiate. Pivoting to left to chair.  Ambulation/Gait                 Stairs             Wheelchair Mobility    Modified Rankin (Stroke Patients Only)        Balance                                            Cognition                                              Exercises General Exercises - Lower Extremity Ankle Circles/Pumps: Both;10 reps;Seated Long Arc Quad: Both;10 reps;Seated Hip Flexion/Marching: AROM;Both;10 reps;Seated    General Comments        Pertinent Vitals/Pain Pain Assessment: 0-10 Pain Score: 7  Pain Location: back pain Pain Descriptors / Indicators: Aching;Discomfort    Home Living                      Prior Function            PT Goals (current goals can now be found in the care plan section) Acute Rehab PT Goals Patient Stated Goal: I wish this brace fit better PT Goal Formulation: With patient Time For Goal Achievement: 05/13/20 Potential to Achieve Goals: Good Progress towards PT goals: Progressing toward goals    Frequency    Min 2X/week      PT Plan Discharge plan needs to be updated;Frequency needs to be updated    Co-evaluation              AM-PAC PT "  6 Clicks" Mobility   Outcome Measure  Help needed turning from your back to your side while in a flat bed without using bedrails?: A Lot Help needed moving from lying on your back to sitting on the side of a flat bed without using bedrails?: A Lot Help needed moving to and from a bed to a chair (including a wheelchair)?: A Little Help needed standing up from a chair using your arms (e.g., wheelchair or bedside chair)?: A Little Help needed to walk in hospital room?: A Little Help needed climbing 3-5 steps with a railing? : A Lot 6 Click Score: 15    End of Session Equipment Utilized During Treatment: Gait belt;Back brace Activity Tolerance: Patient tolerated treatment well Patient left: in chair;with call bell/phone within reach;with chair alarm set Nurse Communication: Mobility status;Patient requests pain meds PT Visit Diagnosis: Unsteadiness on feet (R26.81);Muscle weakness  (generalized) (M62.81);Difficulty in walking, not elsewhere classified (R26.2)     Time: 6384-5364 PT Time Calculation (min) (ACUTE ONLY): 21 min  Charges:  $Therapeutic Activity: 8-22 mins                     Lyanne Co, DPT Acute Rehabilitation Services [phone]   [place] 05/02/2020, 11:07 AM

## 2020-05-02 NOTE — Progress Notes (Signed)
D/C instructions printed and placed in packet at nurse's station for [place]. Tele and IV's removed, tolerated well.

## 2020-05-02 NOTE — Plan of Care (Signed)
  Problem: Education: Goal: Knowledge of General Education information will improve Description: Including pain rating scale, medication(s)/side effects and non-pharmacologic comfort measures Outcome: Adequate for Discharge   Problem: Health Behavior/Discharge Planning: Goal: Ability to manage health-related needs will improve Outcome: Adequate for Discharge   Problem: Clinical Measurements: Goal: Ability to maintain clinical measurements within normal limits will improve Outcome: Adequate for Discharge Goal: Will remain free from infection Outcome: Adequate for Discharge Goal: Diagnostic test results will improve Outcome: Adequate for Discharge Goal: Respiratory complications will improve Outcome: Adequate for Discharge Goal: Cardiovascular complication will be avoided Outcome: Adequate for Discharge   Problem: Activity: Goal: Risk for activity intolerance will decrease Outcome: Adequate for Discharge   Problem: Nutrition: Goal: Adequate nutrition will be maintained Outcome: Adequate for Discharge   Problem: Coping: Goal: Level of anxiety will decrease Outcome: Adequate for Discharge   Problem: Elimination: Goal: Will not experience complications related to bowel motility Outcome: Adequate for Discharge Goal: Will not experience complications related to urinary retention Outcome: Adequate for Discharge   Problem: Pain Managment: Goal: General experience of comfort will improve Outcome: Adequate for Discharge   Problem: Safety: Goal: Ability to remain free from injury will improve Outcome: Adequate for Discharge   Problem: Skin Integrity: Goal: Risk for impaired skin integrity will decrease Outcome: Adequate for Discharge   Problem: Education: Goal: Knowledge of disease and its progression will improve Outcome: Adequate for Discharge   Problem: Health Behavior/Discharge Planning: Goal: Ability to manage health-related needs will improve Outcome: Adequate for  Discharge   Problem: Clinical Measurements: Goal: Complications related to the disease process or treatment will be avoided or minimized Outcome: Adequate for Discharge Goal: Dialysis access will remain free of complications Outcome: Adequate for Discharge   Problem: Activity: Goal: Activity intolerance will improve Outcome: Adequate for Discharge   Problem: Fluid Volume: Goal: Fluid volume balance will be maintained or improved Outcome: Adequate for Discharge   Problem: Nutritional: Goal: Ability to make appropriate dietary choices will improve Outcome: Adequate for Discharge   Problem: Respiratory: Goal: Respiratory symptoms related to disease process will be avoided Outcome: Adequate for Discharge   Problem: Self-Concept: Goal: Body image disturbance will be avoided or minimized Outcome: Adequate for Discharge   Problem: Urinary Elimination: Goal: Progression of disease will be identified and treated Outcome: Adequate for Discharge

## 2020-05-03 DIAGNOSIS — M109 Gout, unspecified: Secondary | ICD-10-CM | POA: Diagnosis not present

## 2020-05-03 DIAGNOSIS — E119 Type 2 diabetes mellitus without complications: Secondary | ICD-10-CM | POA: Diagnosis not present

## 2020-05-03 DIAGNOSIS — M5430 Sciatica, unspecified side: Secondary | ICD-10-CM | POA: Diagnosis not present

## 2020-05-03 DIAGNOSIS — N189 Chronic kidney disease, unspecified: Secondary | ICD-10-CM | POA: Diagnosis not present

## 2020-05-03 DIAGNOSIS — D649 Anemia, unspecified: Secondary | ICD-10-CM | POA: Diagnosis not present

## 2020-05-03 LAB — TYPE AND SCREEN
ABO/RH(D): O POS
Antibody Screen: NEGATIVE
Unit division: 0
Unit division: 0

## 2020-05-03 LAB — BPAM RBC
Blood Product Expiration Date: 202201142359
Blood Product Expiration Date: 202201142359
ISSUE DATE / TIME: 202112132258
Unit Type and Rh: 5100
Unit Type and Rh: 5100

## 2020-05-13 DIAGNOSIS — M109 Gout, unspecified: Secondary | ICD-10-CM | POA: Diagnosis not present

## 2020-05-13 DIAGNOSIS — M4326 Fusion of spine, lumbar region: Secondary | ICD-10-CM | POA: Diagnosis not present

## 2020-05-13 DIAGNOSIS — N189 Chronic kidney disease, unspecified: Secondary | ICD-10-CM | POA: Diagnosis not present

## 2020-05-13 DIAGNOSIS — E119 Type 2 diabetes mellitus without complications: Secondary | ICD-10-CM | POA: Diagnosis not present

## 2020-05-13 DIAGNOSIS — Z789 Other specified health status: Secondary | ICD-10-CM | POA: Diagnosis not present

## 2020-05-14 DIAGNOSIS — M5430 Sciatica, unspecified side: Secondary | ICD-10-CM | POA: Diagnosis not present

## 2020-05-14 DIAGNOSIS — M48061 Spinal stenosis, lumbar region without neurogenic claudication: Secondary | ICD-10-CM | POA: Diagnosis not present

## 2020-05-16 DIAGNOSIS — M5430 Sciatica, unspecified side: Secondary | ICD-10-CM | POA: Diagnosis not present

## 2020-05-16 DIAGNOSIS — D649 Anemia, unspecified: Secondary | ICD-10-CM | POA: Diagnosis not present

## 2020-05-16 DIAGNOSIS — N189 Chronic kidney disease, unspecified: Secondary | ICD-10-CM | POA: Diagnosis not present

## 2020-05-16 DIAGNOSIS — M109 Gout, unspecified: Secondary | ICD-10-CM | POA: Diagnosis not present

## 2020-05-16 DIAGNOSIS — E119 Type 2 diabetes mellitus without complications: Secondary | ICD-10-CM | POA: Diagnosis not present

## 2020-05-21 ENCOUNTER — Other Ambulatory Visit: Payer: Self-pay

## 2020-05-21 MED ORDER — ONETOUCH ULTRA VI STRP: ORAL_STRIP | 4 refills | Status: DC

## 2020-05-21 MED ORDER — ONETOUCH VERIO W/DEVICE KIT: PACK | 0 refills | Status: DC

## 2020-05-22 DIAGNOSIS — T402X5D Adverse effect of other opioids, subsequent encounter: Secondary | ICD-10-CM | POA: Diagnosis not present

## 2020-05-22 DIAGNOSIS — N179 Acute kidney failure, unspecified: Secondary | ICD-10-CM | POA: Diagnosis not present

## 2020-05-22 DIAGNOSIS — M48061 Spinal stenosis, lumbar region without neurogenic claudication: Secondary | ICD-10-CM | POA: Diagnosis not present

## 2020-05-22 DIAGNOSIS — D649 Anemia, unspecified: Secondary | ICD-10-CM | POA: Diagnosis not present

## 2020-05-22 DIAGNOSIS — I129 Hypertensive chronic kidney disease with stage 1 through stage 4 chronic kidney disease, or unspecified chronic kidney disease: Secondary | ICD-10-CM | POA: Diagnosis not present

## 2020-05-22 DIAGNOSIS — Z7982 Long term (current) use of aspirin: Secondary | ICD-10-CM | POA: Diagnosis not present

## 2020-05-22 DIAGNOSIS — Z87891 Personal history of nicotine dependence: Secondary | ICD-10-CM | POA: Diagnosis not present

## 2020-05-22 DIAGNOSIS — Z981 Arthrodesis status: Secondary | ICD-10-CM | POA: Diagnosis not present

## 2020-05-22 DIAGNOSIS — Z4789 Encounter for other orthopedic aftercare: Secondary | ICD-10-CM | POA: Diagnosis not present

## 2020-05-22 DIAGNOSIS — N184 Chronic kidney disease, stage 4 (severe): Secondary | ICD-10-CM | POA: Diagnosis not present

## 2020-05-22 DIAGNOSIS — M1990 Unspecified osteoarthritis, unspecified site: Secondary | ICD-10-CM | POA: Diagnosis not present

## 2020-05-22 DIAGNOSIS — E1122 Type 2 diabetes mellitus with diabetic chronic kidney disease: Secondary | ICD-10-CM | POA: Diagnosis not present

## 2020-05-23 ENCOUNTER — Ambulatory Visit: Payer: Medicare Other | Admitting: Family Medicine

## 2020-05-23 DIAGNOSIS — Z87891 Personal history of nicotine dependence: Secondary | ICD-10-CM | POA: Diagnosis not present

## 2020-05-23 DIAGNOSIS — N179 Acute kidney failure, unspecified: Secondary | ICD-10-CM | POA: Diagnosis not present

## 2020-05-23 DIAGNOSIS — N184 Chronic kidney disease, stage 4 (severe): Secondary | ICD-10-CM | POA: Diagnosis not present

## 2020-05-23 DIAGNOSIS — M48061 Spinal stenosis, lumbar region without neurogenic claudication: Secondary | ICD-10-CM | POA: Diagnosis not present

## 2020-05-23 DIAGNOSIS — E1122 Type 2 diabetes mellitus with diabetic chronic kidney disease: Secondary | ICD-10-CM | POA: Diagnosis not present

## 2020-05-23 DIAGNOSIS — M1990 Unspecified osteoarthritis, unspecified site: Secondary | ICD-10-CM | POA: Diagnosis not present

## 2020-05-23 DIAGNOSIS — D649 Anemia, unspecified: Secondary | ICD-10-CM | POA: Diagnosis not present

## 2020-05-23 DIAGNOSIS — Z4789 Encounter for other orthopedic aftercare: Secondary | ICD-10-CM | POA: Diagnosis not present

## 2020-05-23 DIAGNOSIS — Z981 Arthrodesis status: Secondary | ICD-10-CM | POA: Diagnosis not present

## 2020-05-23 DIAGNOSIS — I129 Hypertensive chronic kidney disease with stage 1 through stage 4 chronic kidney disease, or unspecified chronic kidney disease: Secondary | ICD-10-CM | POA: Diagnosis not present

## 2020-05-23 DIAGNOSIS — T402X5D Adverse effect of other opioids, subsequent encounter: Secondary | ICD-10-CM | POA: Diagnosis not present

## 2020-05-23 DIAGNOSIS — Z7982 Long term (current) use of aspirin: Secondary | ICD-10-CM | POA: Diagnosis not present

## 2020-05-28 DIAGNOSIS — Z981 Arthrodesis status: Secondary | ICD-10-CM | POA: Diagnosis not present

## 2020-05-28 DIAGNOSIS — Z4789 Encounter for other orthopedic aftercare: Secondary | ICD-10-CM | POA: Diagnosis not present

## 2020-05-28 DIAGNOSIS — I129 Hypertensive chronic kidney disease with stage 1 through stage 4 chronic kidney disease, or unspecified chronic kidney disease: Secondary | ICD-10-CM | POA: Diagnosis not present

## 2020-05-28 DIAGNOSIS — N179 Acute kidney failure, unspecified: Secondary | ICD-10-CM | POA: Diagnosis not present

## 2020-05-28 DIAGNOSIS — N184 Chronic kidney disease, stage 4 (severe): Secondary | ICD-10-CM | POA: Diagnosis not present

## 2020-05-28 DIAGNOSIS — D649 Anemia, unspecified: Secondary | ICD-10-CM | POA: Diagnosis not present

## 2020-05-28 DIAGNOSIS — T402X5D Adverse effect of other opioids, subsequent encounter: Secondary | ICD-10-CM | POA: Diagnosis not present

## 2020-05-28 DIAGNOSIS — E1122 Type 2 diabetes mellitus with diabetic chronic kidney disease: Secondary | ICD-10-CM | POA: Diagnosis not present

## 2020-05-28 DIAGNOSIS — M48061 Spinal stenosis, lumbar region without neurogenic claudication: Secondary | ICD-10-CM | POA: Diagnosis not present

## 2020-05-28 DIAGNOSIS — Z7982 Long term (current) use of aspirin: Secondary | ICD-10-CM | POA: Diagnosis not present

## 2020-05-28 DIAGNOSIS — Z87891 Personal history of nicotine dependence: Secondary | ICD-10-CM | POA: Diagnosis not present

## 2020-05-28 DIAGNOSIS — M1990 Unspecified osteoarthritis, unspecified site: Secondary | ICD-10-CM | POA: Diagnosis not present

## 2020-05-30 DIAGNOSIS — N2581 Secondary hyperparathyroidism of renal origin: Secondary | ICD-10-CM | POA: Diagnosis not present

## 2020-05-30 DIAGNOSIS — D649 Anemia, unspecified: Secondary | ICD-10-CM | POA: Diagnosis not present

## 2020-05-30 DIAGNOSIS — I129 Hypertensive chronic kidney disease with stage 1 through stage 4 chronic kidney disease, or unspecified chronic kidney disease: Secondary | ICD-10-CM | POA: Diagnosis not present

## 2020-05-30 DIAGNOSIS — M48061 Spinal stenosis, lumbar region without neurogenic claudication: Secondary | ICD-10-CM | POA: Diagnosis not present

## 2020-05-30 DIAGNOSIS — D631 Anemia in chronic kidney disease: Secondary | ICD-10-CM | POA: Diagnosis not present

## 2020-05-30 DIAGNOSIS — T402X5D Adverse effect of other opioids, subsequent encounter: Secondary | ICD-10-CM | POA: Diagnosis not present

## 2020-05-30 DIAGNOSIS — N189 Chronic kidney disease, unspecified: Secondary | ICD-10-CM | POA: Diagnosis not present

## 2020-05-30 DIAGNOSIS — Z4789 Encounter for other orthopedic aftercare: Secondary | ICD-10-CM | POA: Diagnosis not present

## 2020-05-30 DIAGNOSIS — M1990 Unspecified osteoarthritis, unspecified site: Secondary | ICD-10-CM | POA: Diagnosis not present

## 2020-05-30 DIAGNOSIS — E1122 Type 2 diabetes mellitus with diabetic chronic kidney disease: Secondary | ICD-10-CM | POA: Diagnosis not present

## 2020-05-30 DIAGNOSIS — Z87891 Personal history of nicotine dependence: Secondary | ICD-10-CM | POA: Diagnosis not present

## 2020-05-30 DIAGNOSIS — N179 Acute kidney failure, unspecified: Secondary | ICD-10-CM | POA: Diagnosis not present

## 2020-05-30 DIAGNOSIS — Z7982 Long term (current) use of aspirin: Secondary | ICD-10-CM | POA: Diagnosis not present

## 2020-05-30 DIAGNOSIS — N184 Chronic kidney disease, stage 4 (severe): Secondary | ICD-10-CM | POA: Diagnosis not present

## 2020-05-30 DIAGNOSIS — Z981 Arthrodesis status: Secondary | ICD-10-CM | POA: Diagnosis not present

## 2020-05-30 NOTE — Progress Notes (Signed)
    SUBJECTIVE:   CHIEF COMPLAINT / HPI: f/u from SNF and hospital admission  DM: She reports her BG at home has been 90-150, recent A1c on 1//2022 was 6.8%. Patient is compliant with home meds: lantus 40U qd, ozempic.  HTN: Patient had meds held in hospital during admission, but she has since restarted her home losartan and chlorthalidone. BP today is close to goal at 138/72.   AoCKD: Patient admitted with AKI after laminectomy in December. Patient saw Dr. Posey Pronto, nephrology, at [facility] yesterday on 1/13 and did blood work. Will attempt to obtain from [place].  Anemia: Patient with anemia while admitted in December and h/o GI bleed. Will recheck CBC today to assess anemia.  Incontinence s/p laminectomy: Patient underwent PLIF from L3-L5 on 04/26/2021. Using a walker, still uncomfortable. She has noticed that she has nightly urinary incontinence, a problem she has never had prior to surgery. She reports difficulty with being able to get up and get to bathroom in time at night. Recommend patient discuss with surgeon, she has appt on 06/04/20.  PERTINENT  PMH / PSH: DM, HTN, CKD IIIB, anemia  OBJECTIVE:   BP 138/72   Pulse 76   Ht 5\' 3"  (1.6 m)   Wt 242 lb (109.8 kg)   SpO2 99%   BMI 42.87 kg/m   Nursing note and vitals reviewed GEN: appears younger than age, AAW, resting comfortably in chair, NAD, obese HEENT: NCAT. Sclera without injection or icterus.  Cardiac: Regular rate and rhythm. Normal S1/S2. No murmurs, rubs, or gallops appreciated. 2+ radial pulses. Lungs: Clear bilaterally to ascultation. No increased WOB, no accessory muscle usage. No w/r/r.  Neuro: Alert and at baseline, 2+ DTRs Ext: no edema Psych: Pleasant and appropriate  Diabetic Foot Exam - Simple   Simple Foot Form Diabetic Foot exam was performed with the following findings: Yes 05/31/2020  2:07 PM  Visual Inspection No deformities, no ulcerations, no other skin breakdown bilaterally: Yes Sensation Testing Intact to  touch and monofilament testing bilaterally: Yes Pulse Check Posterior Tibialis and Dorsalis pulse intact bilaterally: Yes Comments    ASSESSMENT/PLAN:   Type 2 diabetes mellitus with diabetic neuropathy, with long-term current use of insulin (HCC) DM Current Regimen: Lantus 40U, ozempic CBGs: 90-150  Last A1c: 6.8 on 05/24/20  Denies polyuria, polydipsia, hypoglycemia  Last Eye Exam: May 2021 Statin: yes, rosuvastatin 40 mg ACE/ARB: losartan 100 mg  Well controlled, continue current regimen    Essential hypertension Hypertension: - Medications: coreg 25mg  BID, chlorthalidone 25mg , losartan 100 mg - Compliance: good - Checking BP at home: yes, at goal - Denies any SOB, CP, vision changes, LE edema, medication SEs, or symptoms of hypotension - Diet: SAD - Exercise: difficult due to recent surgery, in PT, using walker   Chronic anemia CBC obtained today, hgb at 8.5, up from 7.5 one month ago. Still low, unclear cause, remotely AoC anemia w/ CKD is possibility. Patient had GI bleed in June 2021. Last colonoscopy in May 2019, WNL, tentative repeat for 2029. Can consider FIT or refer to GI for further work up.  CKD (chronic kidney disease), stage IV [facility]) Patient established with nephrology, [facility], Dr. Posey Pronto on 05/31/20. Will attempt to obtain records.  Spondylolisthesis at L4-L5 level S/p PLIF on 04/26/20 now with urinary incontinence after surgery. Recommend f/u with neurosx on 06/04/20.     Michelle Damme, MD [place]

## 2020-05-31 ENCOUNTER — Encounter: Payer: Self-pay | Admitting: Family Medicine

## 2020-05-31 ENCOUNTER — Other Ambulatory Visit: Payer: Self-pay

## 2020-05-31 ENCOUNTER — Ambulatory Visit (INDEPENDENT_AMBULATORY_CARE_PROVIDER_SITE_OTHER): Payer: Medicare Other | Admitting: Family Medicine

## 2020-05-31 VITALS — BP 138/72 | HR 76 | Ht 63.0 in | Wt 242.0 lb

## 2020-05-31 DIAGNOSIS — M4316 Spondylolisthesis, lumbar region: Secondary | ICD-10-CM | POA: Diagnosis not present

## 2020-05-31 DIAGNOSIS — I10 Essential (primary) hypertension: Secondary | ICD-10-CM | POA: Diagnosis not present

## 2020-05-31 DIAGNOSIS — D509 Iron deficiency anemia, unspecified: Secondary | ICD-10-CM | POA: Diagnosis not present

## 2020-05-31 DIAGNOSIS — Z23 Encounter for immunization: Secondary | ICD-10-CM

## 2020-05-31 DIAGNOSIS — Z794 Long term (current) use of insulin: Secondary | ICD-10-CM

## 2020-05-31 DIAGNOSIS — E1140 Type 2 diabetes mellitus with diabetic neuropathy, unspecified: Secondary | ICD-10-CM

## 2020-05-31 DIAGNOSIS — N184 Chronic kidney disease, stage 4 (severe): Secondary | ICD-10-CM | POA: Diagnosis not present

## 2020-05-31 DIAGNOSIS — D649 Anemia, unspecified: Secondary | ICD-10-CM | POA: Diagnosis not present

## 2020-05-31 MED ORDER — TETANUS-DIPHTH-ACELL PERTUSSIS 5-2.5-18.5 LF-MCG/0.5 IM SUSY: 0.5000 mL | PREFILLED_SYRINGE | Freq: Once | INTRAMUSCULAR | 0 refills | Status: AC

## 2020-05-31 NOTE — Patient Instructions (Signed)
It was a pleasure to see you today!  1. We will get some labs today.  If they are abnormal or we need to do something about them, I will call you.  If they are normal, I will send you a message on MyChart (if it is active) or a letter in the mail.  If you don't hear from [place] in 2 weeks, please call the office  [phone].  2. You are doing so well with your diabetes, good work! Follow up in 3 months.  3. For your incontinence, please talk to your neurosurgeon about this in 4 days.  4. I recommend you go to a pharmacy for your COVID booster and to get the tetanus vaccine whenever is convenient for you.    Be Well,  Dr. Chauncey Reading

## 2020-06-01 LAB — CBC WITH DIFFERENTIAL/PLATELET
Basophils Absolute: 0 10*3/uL (ref 0.0–0.2)
Basos: 1 %
EOS (ABSOLUTE): 0.2 10*3/uL (ref 0.0–0.4)
Eos: 4 %
Hematocrit: 28.8 % — ABNORMAL LOW (ref 34.0–46.6)
Hemoglobin: 8.5 g/dL — ABNORMAL LOW (ref 11.1–15.9)
Immature Grans (Abs): 0 10*3/uL (ref 0.0–0.1)
Immature Granulocytes: 0 %
Lymphocytes Absolute: 1.6 10*3/uL (ref 0.7–3.1)
Lymphs: 26 %
MCH: 25.4 pg — ABNORMAL LOW (ref 26.6–33.0)
MCHC: 29.5 g/dL — ABNORMAL LOW (ref 31.5–35.7)
MCV: 86 fL (ref 79–97)
Monocytes Absolute: 0.6 10*3/uL (ref 0.1–0.9)
Monocytes: 10 %
Neutrophils Absolute: 3.6 10*3/uL (ref 1.4–7.0)
Neutrophils: 59 %
Platelets: 210 10*3/uL (ref 150–450)
RBC: 3.34 x10E6/uL — ABNORMAL LOW (ref 3.77–5.28)
RDW: 13.9 % (ref 11.7–15.4)
WBC: 6.1 10*3/uL (ref 3.4–10.8)

## 2020-06-01 NOTE — Assessment & Plan Note (Signed)
Patient established with nephrology, [facility], Dr. Posey Pronto on 05/31/20. Will attempt to obtain records.

## 2020-06-01 NOTE — Assessment & Plan Note (Signed)
CBC obtained today, hgb at 8.5, up from 7.5 one month ago. Still low, unclear cause, remotely AoC anemia w/ CKD is possibility. Patient had GI bleed in June 2021. Last colonoscopy in May 2019, WNL, tentative repeat for 2029. Can consider FIT or refer to GI for further work up.

## 2020-06-01 NOTE — Assessment & Plan Note (Signed)
DM Current Regimen: Lantus 40U, ozempic CBGs: 90-150  Last A1c: 6.8 on 05/24/20  Denies polyuria, polydipsia, hypoglycemia  Last Eye Exam: May 2021 Statin: yes, rosuvastatin 40 mg ACE/ARB: losartan 100 mg  Well controlled, continue current regimen

## 2020-06-01 NOTE — Assessment & Plan Note (Signed)
S/p PLIF on 04/26/20 now with urinary incontinence after surgery. Recommend f/u with neurosx on 06/04/20.

## 2020-06-01 NOTE — Assessment & Plan Note (Signed)
Hypertension: - Medications: coreg 25mg  BID, chlorthalidone 25mg , losartan 100 mg - Compliance: good - Checking BP at home: yes, at goal - Denies any SOB, CP, vision changes, LE edema, medication SEs, or symptoms of hypotension - Diet: SAD - Exercise: difficult due to recent surgery, in PT, using walker

## 2020-06-06 DIAGNOSIS — E1122 Type 2 diabetes mellitus with diabetic chronic kidney disease: Secondary | ICD-10-CM | POA: Diagnosis not present

## 2020-06-06 DIAGNOSIS — N179 Acute kidney failure, unspecified: Secondary | ICD-10-CM | POA: Diagnosis not present

## 2020-06-06 DIAGNOSIS — M1990 Unspecified osteoarthritis, unspecified site: Secondary | ICD-10-CM | POA: Diagnosis not present

## 2020-06-06 DIAGNOSIS — Z7982 Long term (current) use of aspirin: Secondary | ICD-10-CM | POA: Diagnosis not present

## 2020-06-06 DIAGNOSIS — M48061 Spinal stenosis, lumbar region without neurogenic claudication: Secondary | ICD-10-CM | POA: Diagnosis not present

## 2020-06-06 DIAGNOSIS — Z4789 Encounter for other orthopedic aftercare: Secondary | ICD-10-CM | POA: Diagnosis not present

## 2020-06-06 DIAGNOSIS — Z87891 Personal history of nicotine dependence: Secondary | ICD-10-CM | POA: Diagnosis not present

## 2020-06-06 DIAGNOSIS — T402X5D Adverse effect of other opioids, subsequent encounter: Secondary | ICD-10-CM | POA: Diagnosis not present

## 2020-06-06 DIAGNOSIS — Z981 Arthrodesis status: Secondary | ICD-10-CM | POA: Diagnosis not present

## 2020-06-06 DIAGNOSIS — I129 Hypertensive chronic kidney disease with stage 1 through stage 4 chronic kidney disease, or unspecified chronic kidney disease: Secondary | ICD-10-CM | POA: Diagnosis not present

## 2020-06-06 DIAGNOSIS — N184 Chronic kidney disease, stage 4 (severe): Secondary | ICD-10-CM | POA: Diagnosis not present

## 2020-06-06 DIAGNOSIS — D649 Anemia, unspecified: Secondary | ICD-10-CM | POA: Diagnosis not present

## 2020-06-07 DIAGNOSIS — I129 Hypertensive chronic kidney disease with stage 1 through stage 4 chronic kidney disease, or unspecified chronic kidney disease: Secondary | ICD-10-CM | POA: Diagnosis not present

## 2020-06-07 DIAGNOSIS — D649 Anemia, unspecified: Secondary | ICD-10-CM | POA: Diagnosis not present

## 2020-06-07 DIAGNOSIS — Z7982 Long term (current) use of aspirin: Secondary | ICD-10-CM | POA: Diagnosis not present

## 2020-06-07 DIAGNOSIS — Z981 Arthrodesis status: Secondary | ICD-10-CM | POA: Diagnosis not present

## 2020-06-07 DIAGNOSIS — Z4789 Encounter for other orthopedic aftercare: Secondary | ICD-10-CM | POA: Diagnosis not present

## 2020-06-07 DIAGNOSIS — T402X5D Adverse effect of other opioids, subsequent encounter: Secondary | ICD-10-CM | POA: Diagnosis not present

## 2020-06-07 DIAGNOSIS — Z87891 Personal history of nicotine dependence: Secondary | ICD-10-CM | POA: Diagnosis not present

## 2020-06-07 DIAGNOSIS — E1122 Type 2 diabetes mellitus with diabetic chronic kidney disease: Secondary | ICD-10-CM | POA: Diagnosis not present

## 2020-06-07 DIAGNOSIS — M48061 Spinal stenosis, lumbar region without neurogenic claudication: Secondary | ICD-10-CM | POA: Diagnosis not present

## 2020-06-07 DIAGNOSIS — M1990 Unspecified osteoarthritis, unspecified site: Secondary | ICD-10-CM | POA: Diagnosis not present

## 2020-06-07 DIAGNOSIS — N179 Acute kidney failure, unspecified: Secondary | ICD-10-CM | POA: Diagnosis not present

## 2020-06-07 DIAGNOSIS — N184 Chronic kidney disease, stage 4 (severe): Secondary | ICD-10-CM | POA: Diagnosis not present

## 2020-06-10 DIAGNOSIS — Z87891 Personal history of nicotine dependence: Secondary | ICD-10-CM | POA: Diagnosis not present

## 2020-06-10 DIAGNOSIS — E1122 Type 2 diabetes mellitus with diabetic chronic kidney disease: Secondary | ICD-10-CM | POA: Diagnosis not present

## 2020-06-10 DIAGNOSIS — N184 Chronic kidney disease, stage 4 (severe): Secondary | ICD-10-CM | POA: Diagnosis not present

## 2020-06-10 DIAGNOSIS — T402X5D Adverse effect of other opioids, subsequent encounter: Secondary | ICD-10-CM | POA: Diagnosis not present

## 2020-06-10 DIAGNOSIS — Z4789 Encounter for other orthopedic aftercare: Secondary | ICD-10-CM | POA: Diagnosis not present

## 2020-06-10 DIAGNOSIS — N179 Acute kidney failure, unspecified: Secondary | ICD-10-CM | POA: Diagnosis not present

## 2020-06-10 DIAGNOSIS — M1990 Unspecified osteoarthritis, unspecified site: Secondary | ICD-10-CM | POA: Diagnosis not present

## 2020-06-10 DIAGNOSIS — I129 Hypertensive chronic kidney disease with stage 1 through stage 4 chronic kidney disease, or unspecified chronic kidney disease: Secondary | ICD-10-CM | POA: Diagnosis not present

## 2020-06-10 DIAGNOSIS — Z7982 Long term (current) use of aspirin: Secondary | ICD-10-CM | POA: Diagnosis not present

## 2020-06-10 DIAGNOSIS — Z981 Arthrodesis status: Secondary | ICD-10-CM | POA: Diagnosis not present

## 2020-06-10 DIAGNOSIS — M48061 Spinal stenosis, lumbar region without neurogenic claudication: Secondary | ICD-10-CM | POA: Diagnosis not present

## 2020-06-10 DIAGNOSIS — D649 Anemia, unspecified: Secondary | ICD-10-CM | POA: Diagnosis not present

## 2020-06-11 DIAGNOSIS — M4316 Spondylolisthesis, lumbar region: Secondary | ICD-10-CM | POA: Diagnosis not present

## 2020-06-12 ENCOUNTER — Other Ambulatory Visit (HOSPITAL_COMMUNITY): Payer: Self-pay

## 2020-06-12 NOTE — Discharge Instructions (Signed)
Ferumoxytol injection What is this medicine? FERUMOXYTOL is an iron complex. Iron is used to make healthy red blood cells, which carry oxygen and nutrients throughout the body. This medicine is used to treat iron deficiency anemia. This medicine may be used for other purposes; ask your health care provider or pharmacist if you have questions. COMMON BRAND NAME(S): Feraheme What should I tell my health care provider before I take this medicine? They need to know if you have any of these conditions:  anemia not caused by low iron levels  high levels of iron in the blood  magnetic resonance imaging (MRI) test scheduled  an unusual or allergic reaction to iron, other medicines, foods, dyes, or preservatives  pregnant or trying to get pregnant  breast-feeding How should I use this medicine? This medicine is for injection into a vein. It is given by a health care professional in a hospital or clinic setting. Talk to your pediatrician regarding the use of this medicine in children. Special care may be needed. Overdosage: If you think you have taken too much of this medicine contact a poison control center or emergency room at once. NOTE: This medicine is only for you. Do not share this medicine with others. What if I miss a dose? It is important not to miss your dose. Call your doctor or health care professional if you are unable to keep an appointment. What may interact with this medicine? This medicine may interact with the following medications:  other iron products This list may not describe all possible interactions. Give your health care provider a list of all the medicines, herbs, non-prescription drugs, or dietary supplements you use. Also tell them if you smoke, drink alcohol, or use illegal drugs. Some items may interact with your medicine. What should I watch for while using this medicine? Visit your doctor or healthcare professional regularly. Tell your doctor or healthcare  professional if your symptoms do not start to get better or if they get worse. You may need blood work done while you are taking this medicine. You may need to follow a special diet. Talk to your doctor. Foods that contain iron include: whole grains/cereals, dried fruits, beans, or peas, leafy green vegetables, and organ meats (liver, kidney). What side effects may I notice from receiving this medicine? Side effects that you should report to your doctor or health care professional as soon as possible:  allergic reactions like skin rash, itching or hives, swelling of the face, lips, or tongue  breathing problems  changes in blood pressure  feeling faint or lightheaded, falls  fever or chills  flushing, sweating, or hot feelings  swelling of the ankles or feet Side effects that usually do not require medical attention (report to your doctor or health care professional if they continue or are bothersome):  diarrhea  headache  nausea, vomiting  stomach pain This list may not describe all possible side effects. Call your doctor for medical advice about side effects. You may report side effects to FDA at 1-800-FDA-1088. Where should I keep my medicine? This drug is given in a hospital or clinic and will not be stored at home. NOTE: This sheet is a summary. It may not cover all possible information. If you have questions about this medicine, talk to your doctor, pharmacist, or health care provider.  2021 Elsevier/Gold Standard (2016-06-22 20:21:10)  

## 2020-06-13 ENCOUNTER — Encounter (HOSPITAL_COMMUNITY)
Admission: RE | Admit: 2020-06-13 | Discharge: 2020-06-13 | Disposition: A | Discharge status: Home / Self Care | Payer: Medicare Other | Source: Ambulatory Visit | Attending: Nephrology | Admitting: Nephrology

## 2020-06-13 DIAGNOSIS — D631 Anemia in chronic kidney disease: Secondary | ICD-10-CM | POA: Insufficient documentation

## 2020-06-13 DIAGNOSIS — N189 Chronic kidney disease, unspecified: Secondary | ICD-10-CM | POA: Diagnosis not present

## 2020-06-13 MED ORDER — NITROGLYCERIN 0.4 MG SL SUBL
SUBLINGUAL_TABLET | SUBLINGUAL | Status: AC
  Administered 2020-06-13: 0.4 mg
  Filled 2020-06-13: qty 1

## 2020-06-13 MED ORDER — NITROGLYCERIN 0.4 MG SL SUBL: 0.4000 mg | SUBLINGUAL_TABLET | Freq: Once | SUBLINGUAL | Status: DC

## 2020-06-13 MED ORDER — FERUMOXYTOL INJECTION 510 MG/17 ML
510.0000 mg | INTRAVENOUS | Status: DC
  Administered 2020-06-13: 510 mg via INTRAVENOUS
  Filled 2020-06-13: qty 510

## 2020-06-13 MED ORDER — DIPHENHYDRAMINE HCL 50 MG/ML IJ SOLN
INTRAMUSCULAR | Status: AC
  Filled 2020-06-13: qty 1

## 2020-06-13 MED ORDER — DIPHENHYDRAMINE HCL 50 MG/ML IJ SOLN: 50.0000 mg | Freq: Once | INTRAMUSCULAR | Status: DC

## 2020-06-13 MED ORDER — SODIUM CHLORIDE 0.9 % IV SOLN: Freq: Once | INTRAVENOUS | Status: AC

## 2020-06-13 NOTE — Progress Notes (Signed)
1255- patient c/o sqeezing in chest  10/10.  Stopped feraheme, placed on 2 L O2. Spoke with Deanna at [facility] and Dr. Posey Pronto made aware of  Patient's reaction- ordered 100 cc NS bolus, O2, 1 SL NtG, 50 mg IV benadryl. Before benadryl could be given patient stated she was pain free.  Patient did get 100 cc  NS bolus.   Spoke with Museum/gallery conservator at  [facility] to let her know patient did not get IV benadryl . She was already pain free.  Per [employer] we will cancel next feraheme dose and let patient go home with daughter.

## 2020-06-14 DIAGNOSIS — E1122 Type 2 diabetes mellitus with diabetic chronic kidney disease: Secondary | ICD-10-CM | POA: Diagnosis not present

## 2020-06-14 DIAGNOSIS — Z87891 Personal history of nicotine dependence: Secondary | ICD-10-CM | POA: Diagnosis not present

## 2020-06-14 DIAGNOSIS — Z4789 Encounter for other orthopedic aftercare: Secondary | ICD-10-CM | POA: Diagnosis not present

## 2020-06-14 DIAGNOSIS — Z7982 Long term (current) use of aspirin: Secondary | ICD-10-CM | POA: Diagnosis not present

## 2020-06-14 DIAGNOSIS — D649 Anemia, unspecified: Secondary | ICD-10-CM | POA: Diagnosis not present

## 2020-06-14 DIAGNOSIS — Z981 Arthrodesis status: Secondary | ICD-10-CM | POA: Diagnosis not present

## 2020-06-14 DIAGNOSIS — N179 Acute kidney failure, unspecified: Secondary | ICD-10-CM | POA: Diagnosis not present

## 2020-06-14 DIAGNOSIS — N184 Chronic kidney disease, stage 4 (severe): Secondary | ICD-10-CM | POA: Diagnosis not present

## 2020-06-14 DIAGNOSIS — T402X5D Adverse effect of other opioids, subsequent encounter: Secondary | ICD-10-CM | POA: Diagnosis not present

## 2020-06-14 DIAGNOSIS — M48061 Spinal stenosis, lumbar region without neurogenic claudication: Secondary | ICD-10-CM | POA: Diagnosis not present

## 2020-06-14 DIAGNOSIS — M1990 Unspecified osteoarthritis, unspecified site: Secondary | ICD-10-CM | POA: Diagnosis not present

## 2020-06-14 DIAGNOSIS — I129 Hypertensive chronic kidney disease with stage 1 through stage 4 chronic kidney disease, or unspecified chronic kidney disease: Secondary | ICD-10-CM | POA: Diagnosis not present

## 2020-06-18 ENCOUNTER — Telehealth: Payer: Self-pay | Admitting: Family Medicine

## 2020-06-18 DIAGNOSIS — D649 Anemia, unspecified: Secondary | ICD-10-CM | POA: Diagnosis not present

## 2020-06-18 DIAGNOSIS — T402X5D Adverse effect of other opioids, subsequent encounter: Secondary | ICD-10-CM | POA: Diagnosis not present

## 2020-06-18 DIAGNOSIS — N179 Acute kidney failure, unspecified: Secondary | ICD-10-CM | POA: Diagnosis not present

## 2020-06-18 DIAGNOSIS — M48061 Spinal stenosis, lumbar region without neurogenic claudication: Secondary | ICD-10-CM | POA: Diagnosis not present

## 2020-06-18 DIAGNOSIS — E1122 Type 2 diabetes mellitus with diabetic chronic kidney disease: Secondary | ICD-10-CM | POA: Diagnosis not present

## 2020-06-18 DIAGNOSIS — Z4789 Encounter for other orthopedic aftercare: Secondary | ICD-10-CM | POA: Diagnosis not present

## 2020-06-18 DIAGNOSIS — Z87891 Personal history of nicotine dependence: Secondary | ICD-10-CM | POA: Diagnosis not present

## 2020-06-18 DIAGNOSIS — N184 Chronic kidney disease, stage 4 (severe): Secondary | ICD-10-CM | POA: Diagnosis not present

## 2020-06-18 DIAGNOSIS — I129 Hypertensive chronic kidney disease with stage 1 through stage 4 chronic kidney disease, or unspecified chronic kidney disease: Secondary | ICD-10-CM | POA: Diagnosis not present

## 2020-06-18 DIAGNOSIS — Z7982 Long term (current) use of aspirin: Secondary | ICD-10-CM | POA: Diagnosis not present

## 2020-06-18 DIAGNOSIS — M1990 Unspecified osteoarthritis, unspecified site: Secondary | ICD-10-CM | POA: Diagnosis not present

## 2020-06-18 DIAGNOSIS — Z981 Arthrodesis status: Secondary | ICD-10-CM | POA: Diagnosis not present

## 2020-06-18 NOTE — Telephone Encounter (Signed)
   Michelle Abbott DOB: [birth year] MRN: [id number]   Michelle Abbott  For purposes of improving physical access to our facilities, [facility] is pleased to partner with third parties to provide Michelle Abbott patients or other authorized individuals the option of convenient, on-demand ground transportation Abbott (the [place]") through use of the technology service that enables users to request on-demand ground transportation from independent third-party providers.  By opting to use and accept these [employer], I, the undersigned, hereby agree on behalf of myself, and on behalf of any minor child using the [employer] for whom I am the parent or legal guardian, as follows:  1. Government social research officer provided to me are provided by independent third-party transportation providers who are not Michelle Abbott or employees and who are unaffiliated with Michelle Abbott. 2. Michelle Abbott is neither a transportation carrier nor a common or public carrier. 3. Michelle Abbott has no control over the quality or safety of the transportation that occurs as a result of the [employer]. 4. Michelle Abbott cannot guarantee that any third-party transportation provider will complete any arranged transportation service. 5. Michelle Abbott makes no representation, warranty, or guarantee regarding the reliability, timeliness, quality, safety, suitability, or availability of any of the Michelle Abbott or that they will be error free. 6. I fully understand that traveling by vehicle involves risks and dangers of serious bodily injury, including permanent disability, paralysis, and death. I agree, on behalf of myself and on behalf of any minor child using the Michelle Abbott for whom I am the parent or legal guardian, that the entire risk arising out of my use of the [employer] remains solely with me, to the maximum extent permitted under applicable law. 7. The [employer] are provided "as is" and "as available." Michelle Abbott disclaims all representations and warranties, express, implied or statutory, not expressly set out in these terms, including the implied warranties of merchantability and fitness for a particular purpose. 8. I hereby waive and release Michelle Abbott, its agents, employees, officers, directors, representatives, insurers, attorneys, assigns, successors, subsidiaries, and affiliates from any and all past, present, or future claims, demands, liabilities, actions, causes of action, or suits of any kind directly or indirectly arising from acceptance and use of the [employer]. 9. I further waive and release Michelle Abbott and its affiliates from all present and future Abbott and responsibility for any injury or death to persons or damages to property caused by or related to the use of the [employer]. 10. I have read this Waiver and Release of Abbott, and I understand the terms used in it and their legal significance. This Waiver is freely and voluntarily given with the understanding that my right (as well as the right of any minor child for whom I am the parent or legal guardian using the [employer]) to legal recourse against [place] in connection with the [employer] is knowingly surrendered in return for use of these Abbott.   I attest that I read the consent document to Michelle Abbott, gave Michelle Abbott the opportunity to ask questions and answered the questions asked (if any). I affirm that Michelle Abbott then provided consent for she's participation in this program.     Michelle Abbott

## 2020-06-19 ENCOUNTER — Other Ambulatory Visit (HOSPITAL_COMMUNITY): Payer: Self-pay | Admitting: Neurosurgery

## 2020-06-19 ENCOUNTER — Other Ambulatory Visit: Payer: Self-pay

## 2020-06-19 ENCOUNTER — Ambulatory Visit (HOSPITAL_COMMUNITY)
Admission: RE | Admit: 2020-06-19 | Discharge: 2020-06-19 | Disposition: A | Discharge status: Home / Self Care | Payer: Medicare Other | Source: Ambulatory Visit | Attending: Neurosurgery | Admitting: Neurosurgery

## 2020-06-19 DIAGNOSIS — R600 Localized edema: Secondary | ICD-10-CM | POA: Diagnosis not present

## 2020-06-20 ENCOUNTER — Encounter (HOSPITAL_COMMUNITY): Payer: Medicare Other

## 2020-06-21 ENCOUNTER — Telehealth: Payer: Self-pay

## 2020-06-21 DIAGNOSIS — N179 Acute kidney failure, unspecified: Secondary | ICD-10-CM | POA: Diagnosis not present

## 2020-06-21 DIAGNOSIS — M48061 Spinal stenosis, lumbar region without neurogenic claudication: Secondary | ICD-10-CM | POA: Diagnosis not present

## 2020-06-21 DIAGNOSIS — D649 Anemia, unspecified: Secondary | ICD-10-CM | POA: Diagnosis not present

## 2020-06-21 DIAGNOSIS — T402X5D Adverse effect of other opioids, subsequent encounter: Secondary | ICD-10-CM | POA: Diagnosis not present

## 2020-06-21 DIAGNOSIS — Z7982 Long term (current) use of aspirin: Secondary | ICD-10-CM | POA: Diagnosis not present

## 2020-06-21 DIAGNOSIS — Z981 Arthrodesis status: Secondary | ICD-10-CM | POA: Diagnosis not present

## 2020-06-21 DIAGNOSIS — N184 Chronic kidney disease, stage 4 (severe): Secondary | ICD-10-CM | POA: Diagnosis not present

## 2020-06-21 DIAGNOSIS — Z87891 Personal history of nicotine dependence: Secondary | ICD-10-CM | POA: Diagnosis not present

## 2020-06-21 DIAGNOSIS — M1990 Unspecified osteoarthritis, unspecified site: Secondary | ICD-10-CM | POA: Diagnosis not present

## 2020-06-21 DIAGNOSIS — E1122 Type 2 diabetes mellitus with diabetic chronic kidney disease: Secondary | ICD-10-CM | POA: Diagnosis not present

## 2020-06-21 DIAGNOSIS — I129 Hypertensive chronic kidney disease with stage 1 through stage 4 chronic kidney disease, or unspecified chronic kidney disease: Secondary | ICD-10-CM | POA: Diagnosis not present

## 2020-06-21 DIAGNOSIS — Z4789 Encounter for other orthopedic aftercare: Secondary | ICD-10-CM | POA: Diagnosis not present

## 2020-06-21 NOTE — Telephone Encounter (Signed)
Patient calls nurse line regarding BLE edema. Patient reports that legs are "very tight" and painful. Reports fatigue after having iron infusion. Patient denies current [facility] or chest pain. Advised patient to keep feet and legs elevated when possible and to follow low sodium diet.   Scheduled patient on 06/26/20 for follow up. This is the soonest patient can come in due to transportation.   Strict ED precautions given.   Talbot Grumbling, RN

## 2020-06-22 IMAGING — MR MR LUMBAR SPINE W/O CM
4 of 5 series · 25 of 48 positions shown · non-contrast
Comparison: None.

CLINICAL DATA: Low back pain radiating to the right lower extremity

EXAM:
MRI LUMBAR SPINE WITHOUT CONTRAST
TECHNIQUE: Multiplanar, multisequence MR imaging of the lumbar spine was
performed. No intravenous contrast was administered.

[Series 3: T2 post-contrast · sagittal · 4.0mm · 0.55mm/px · 6 of 17 slices shown]
[im 1/17]
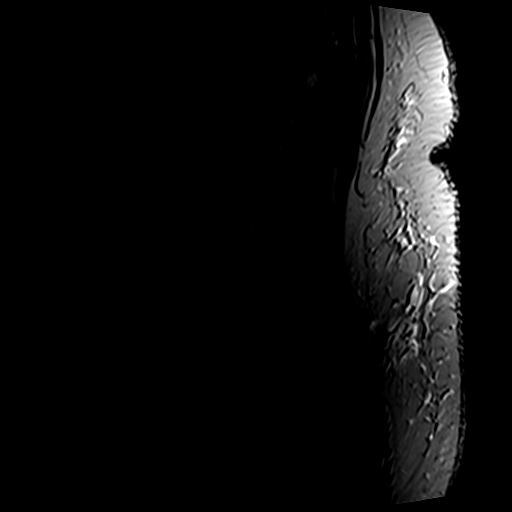
[im 4/17]
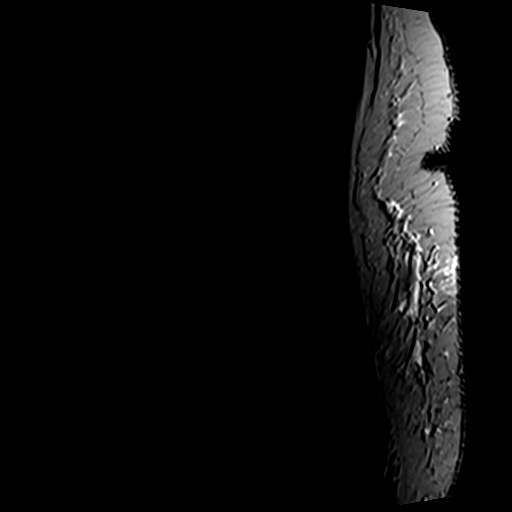
[im 7/17]
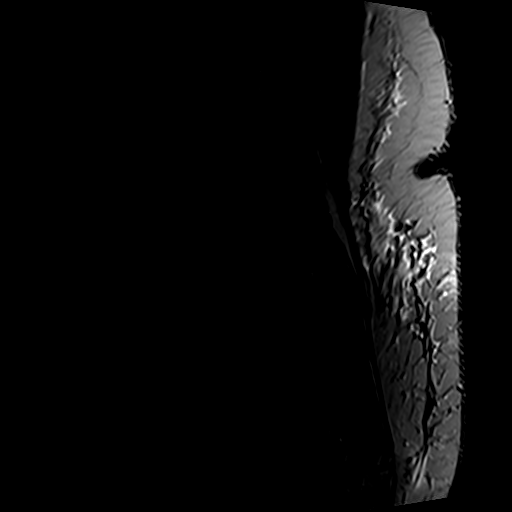
[im 10/17]
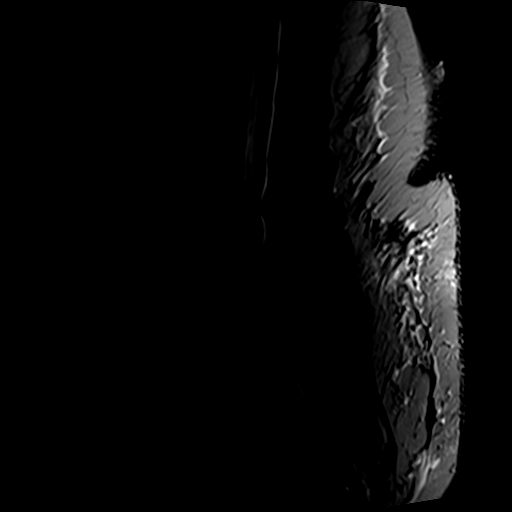
[im 13/17]
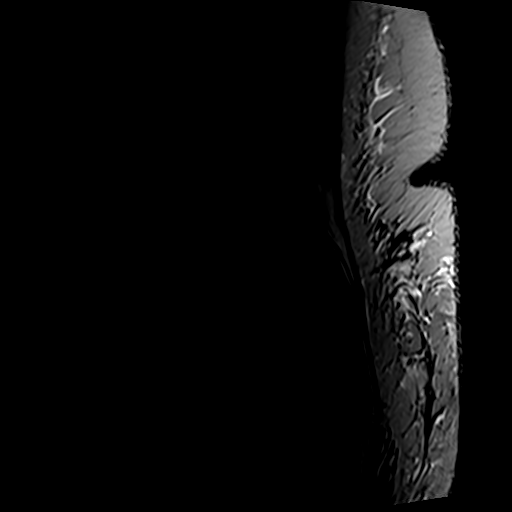
[im 17/17]
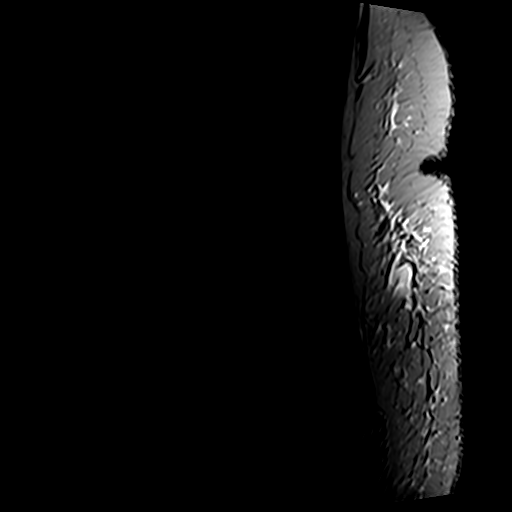

[Series 5: T1 · sagittal · 4.0mm · 0.55mm/px · 7 of 17 slices shown (1 of 2)]
[im 1/17]
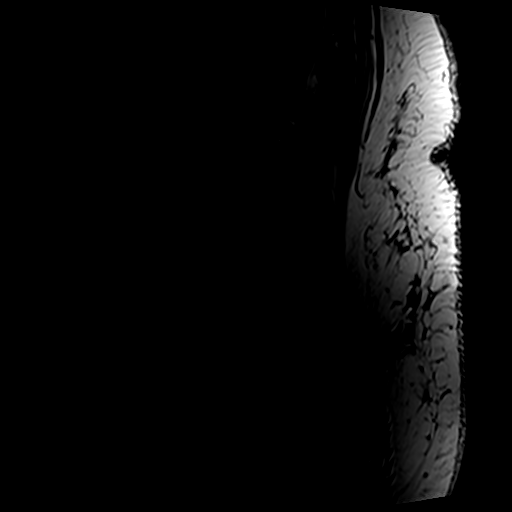
[im 3/17]
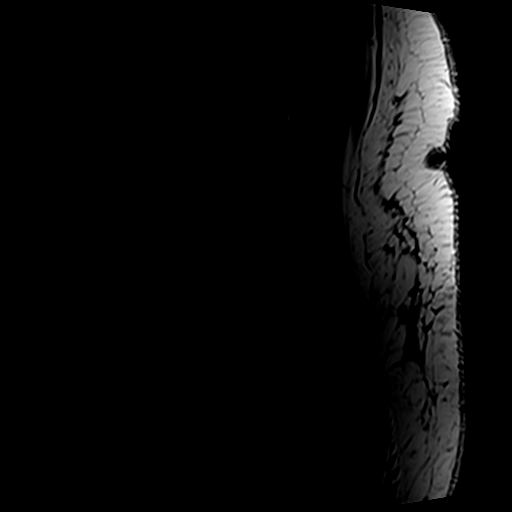
[im 6/17]
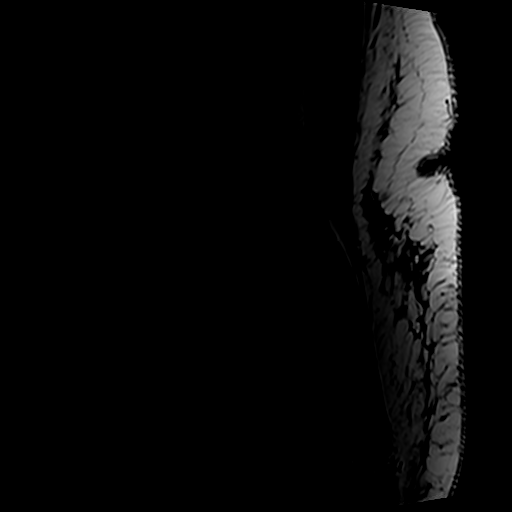
[im 9/17]
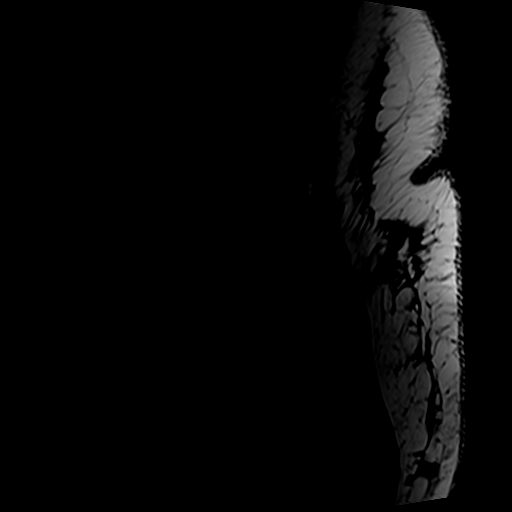
[im 11/17]
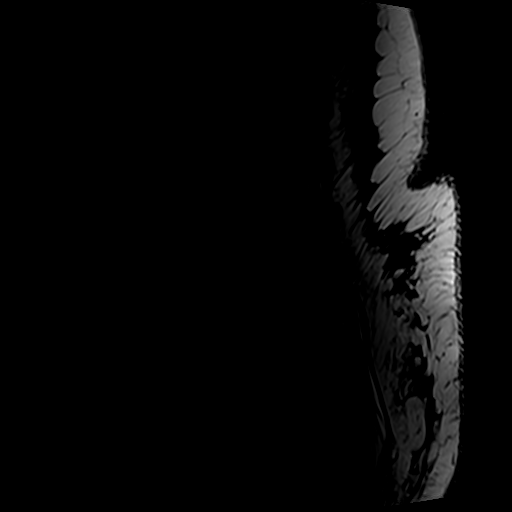
[im 14/17]
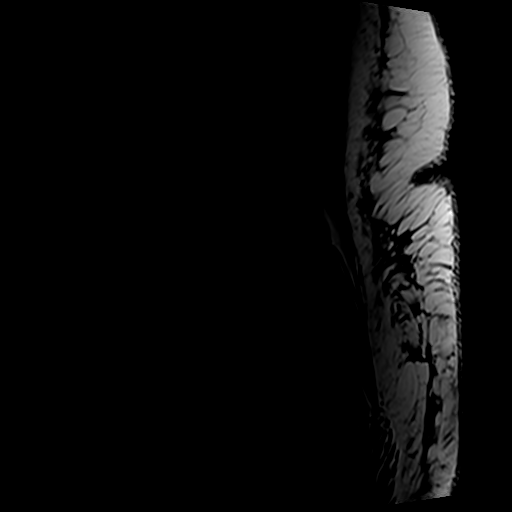
[im 17/17]
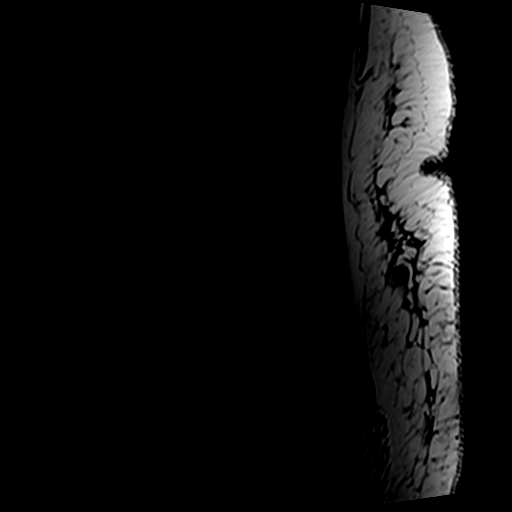

[Series 6: T1 · axial · 4.0mm · 0.35mm/px · z∈[-100,+52]mm · 4 of 34 slices shown (2 of 2)]
[im 1/34]
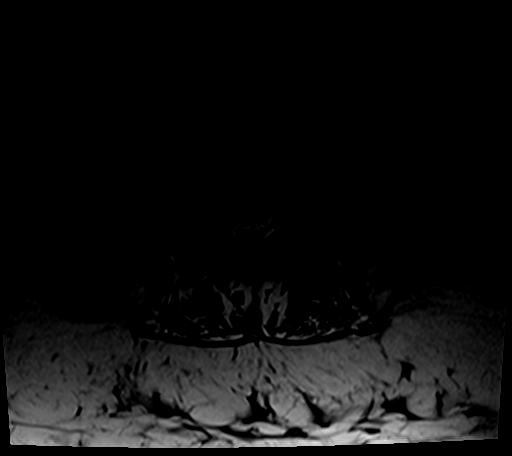
[im 6/34]
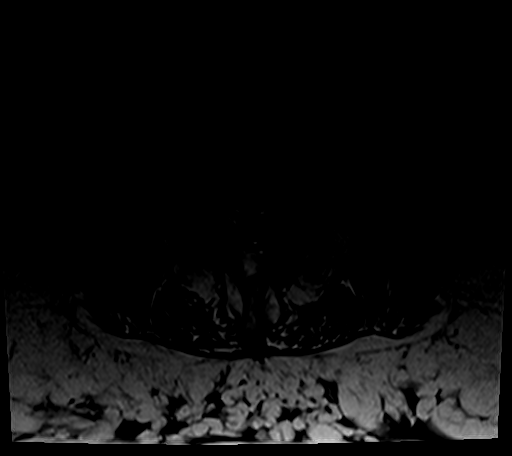
[im 18/34]
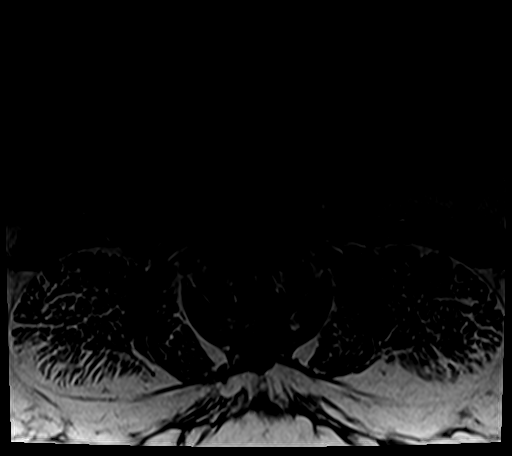
[im 28/34]
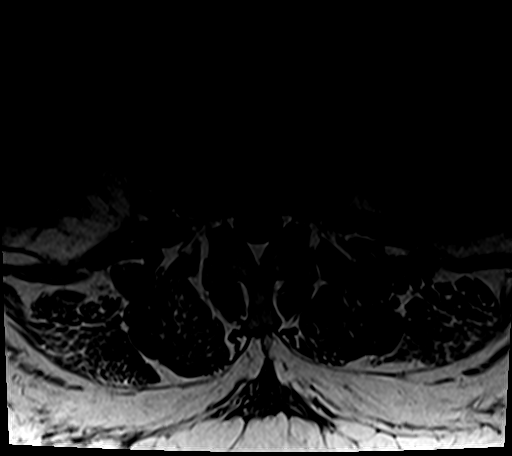

[Series 7: T2 · axial · 4.0mm · 0.70mm/px · z∈[-100,+100]mm · 8 of 34 slices shown]
[im 1/34]
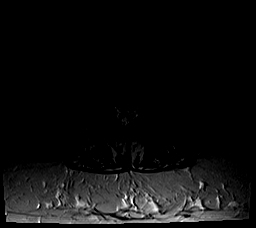
[im 6/34]
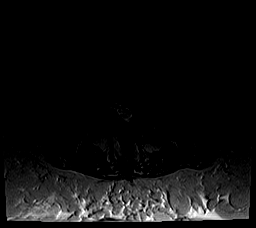
[im 11/34]
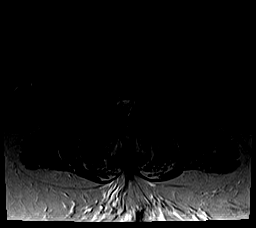
[im 16/34]
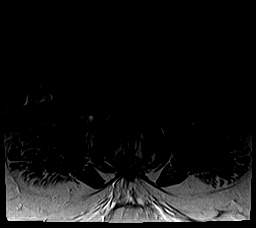
[im 18/34]
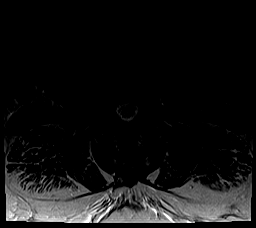
[im 23/34]
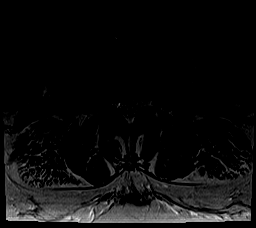
[im 28/34]
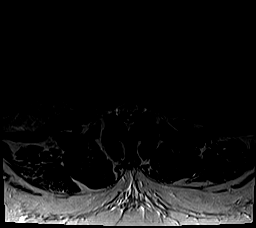
[im 34/34]
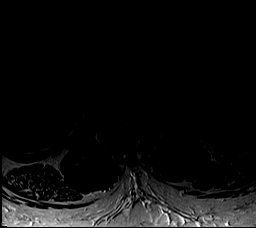

[25 of 48 positions shown; findings below may reference images not displayed]

FINDINGS: Segmentation:  Standard.

Alignment:  Grade 1 anterolisthesis at L4-5

Vertebrae: No fracture, evidence of discitis, or bone lesion. L3
hemangioma.

Conus medullaris and cauda equina: Conus extends to the L2 level.
Conus and cauda equina appear normal.

Paraspinal and other soft tissues: 3.1 cm right and 2.5 cm left
renal cysts.

Disc levels:

The T11-12 level is imaged only in the sagittal plane and shows no
spinal canal or neural foraminal stenosis.

T12-L1: Normal

L1-L2: Normal disc space and facet joints. There is no spinal canal
stenosis. No neural foraminal stenosis.

L2-L3: Right asymmetric disc bulge there is no spinal canal
stenosis. Mild right neural foraminal stenosis.

L3-L4: Right asymmetric diffuse disc bulge. Moderate spinal canal
stenosis. Severe right neural foraminal stenosis.

L4-L5: Severe facet hypertrophy with mild disc bulge. Severe spinal
canal stenosis. Moderate right and severe left neural foraminal
stenosis.

L5-S1: Moderate facet hypertrophy with intermediate disc bulge.
There is no spinal canal stenosis. Mild bilateral neural foraminal
stenosis.

Visualized sacrum: Normal.
IMPRESSION: 1. L4-L5 severe spinal canal stenosis with moderate right and severe
left neural foraminal stenosis secondary to combination of
anterolisthesis, disc bulge and facet arthrosis.
2. L3-L4 moderate spinal canal stenosis and severe right neural
foraminal stenosis.
3. L5-S1 mild bilateral neural foraminal stenosis.

## 2020-06-25 ENCOUNTER — Other Ambulatory Visit: Payer: Self-pay | Admitting: Neurosurgery

## 2020-06-25 DIAGNOSIS — M4807 Spinal stenosis, lumbosacral region: Secondary | ICD-10-CM

## 2020-06-26 ENCOUNTER — Other Ambulatory Visit: Payer: Self-pay

## 2020-06-26 ENCOUNTER — Encounter: Payer: Self-pay | Admitting: Family Medicine

## 2020-06-26 ENCOUNTER — Ambulatory Visit (INDEPENDENT_AMBULATORY_CARE_PROVIDER_SITE_OTHER): Payer: Medicare Other | Admitting: Family Medicine

## 2020-06-26 VITALS — BP 154/68 | HR 89 | Ht 63.0 in | Wt 250.1 lb

## 2020-06-26 DIAGNOSIS — R600 Localized edema: Secondary | ICD-10-CM

## 2020-06-26 NOTE — Progress Notes (Signed)
    SUBJECTIVE:   CHIEF COMPLAINT / HPI:   B/l Leg edema and SOB Patient reports that she has been having issues with bilateral leg edema since her back surgery but that she feels it has been getting worse, especially on the right. Patient had leg [place] to evaluate for DVT on 2/2 and was negative. Patient reports that her swelling has not worsened since her LE [place]. She states that her right ankle is painful to lift up sometimes. She has not been elevating her feet very well. She does report that she has had a new shortness of breath that was not present at that time and that she is now more tired. She states she has never had a problem with her heart She states that her neurosurgeon stated it could possibly be from the nerves in her back or from her heart. She has a MRI scheduled for 3/3.  Anemia Patient has history of anemia and is getting iron infusions. She states that at her last infusion she was "allergic to a medication" and that she is supposed to go back Friday for infusion.    PERTINENT  PMH / PSH: HTN, T2DM, OA, CKD, sciatica, hx of echo with LV diastolic dysfunction (per chart review)  OBJECTIVE:   Pulse 89   Ht 5\' 3"  (1.6 m)   Wt 250 lb 2 oz (113.5 kg)   SpO2 97%   BMI 44.31 kg/m   Gen: well-appearing, NAD CV: RRR, no m/r/g appreciated, peripheral pitting edema Pulm: CTAB, no wheezes/crackles Extremities: b/l 2+ pitting edema (R>L)) below knees and on dorsal feet (R>L), pain with passive and active dorsiflexion   ASSESSMENT/PLAN:   B/L LE Edema w/new SOB Patient has new onset pitting edema bilaterally after her back surgery and new onset of shortness of breath. Physical exam significant for 2+ pitting edema in b/l LE (mildly worse on R>L). Concern for possible heart failure given bilateral edema and respiratory symptoms. Can consider possibly related to back surgery and limited activity, venous stasis (no real appearance of chronic changes including hyperpigmentation). -  Echocardiogram ordered at this time - Consider BNP if patient does not get her echo done (lab was closed at time of exam), and BMP to monitor kidney function   Anemia Patient to have iron infusion on 2/11.  - Recommend follow-up CBC as appropriate after infusion   Arminta Gamm, [place]

## 2020-06-26 NOTE — Patient Instructions (Signed)
It was so great seeing you today! We are planning to schedule you to get an ultrasound of your heart to see if your heart could be contributing to your leg swelling and shortness of breath. If this acutely worsens or if you feel like you can't breathe please call the office or go to the ED.

## 2020-06-27 ENCOUNTER — Telehealth: Payer: Self-pay

## 2020-06-27 ENCOUNTER — Other Ambulatory Visit (HOSPITAL_COMMUNITY): Payer: Self-pay | Admitting: Nephrology

## 2020-06-27 DIAGNOSIS — Z7982 Long term (current) use of aspirin: Secondary | ICD-10-CM | POA: Diagnosis not present

## 2020-06-27 DIAGNOSIS — M48061 Spinal stenosis, lumbar region without neurogenic claudication: Secondary | ICD-10-CM | POA: Diagnosis not present

## 2020-06-27 DIAGNOSIS — Z87891 Personal history of nicotine dependence: Secondary | ICD-10-CM | POA: Diagnosis not present

## 2020-06-27 DIAGNOSIS — D649 Anemia, unspecified: Secondary | ICD-10-CM | POA: Diagnosis not present

## 2020-06-27 DIAGNOSIS — Z981 Arthrodesis status: Secondary | ICD-10-CM | POA: Diagnosis not present

## 2020-06-27 DIAGNOSIS — I129 Hypertensive chronic kidney disease with stage 1 through stage 4 chronic kidney disease, or unspecified chronic kidney disease: Secondary | ICD-10-CM | POA: Diagnosis not present

## 2020-06-27 DIAGNOSIS — T402X5D Adverse effect of other opioids, subsequent encounter: Secondary | ICD-10-CM | POA: Diagnosis not present

## 2020-06-27 DIAGNOSIS — N179 Acute kidney failure, unspecified: Secondary | ICD-10-CM | POA: Diagnosis not present

## 2020-06-27 DIAGNOSIS — Z4789 Encounter for other orthopedic aftercare: Secondary | ICD-10-CM | POA: Diagnosis not present

## 2020-06-27 DIAGNOSIS — N184 Chronic kidney disease, stage 4 (severe): Secondary | ICD-10-CM | POA: Diagnosis not present

## 2020-06-27 DIAGNOSIS — M1990 Unspecified osteoarthritis, unspecified site: Secondary | ICD-10-CM | POA: Diagnosis not present

## 2020-06-27 DIAGNOSIS — E1122 Type 2 diabetes mellitus with diabetic chronic kidney disease: Secondary | ICD-10-CM | POA: Diagnosis not present

## 2020-06-27 NOTE — Discharge Instructions (Signed)
Sodium Ferric Gluconate Complex injection What is this medicine? SODIUM FERRIC GLUCONATE COMPLEX (SOE dee um FER ik GLOO koe nate KOM pleks) is an iron replacement. It is used with epoetin therapy to treat low iron levels in patients who are receiving hemodialysis. This medicine may be used for other purposes; ask your health care provider or pharmacist if you have questions. COMMON BRAND NAME(S): Ferrlecit, Nulecit What should I tell my health care provider before I take this medicine? They need to know if you have any of the following conditions:  anemia that is not from iron deficiency  high levels of iron in the body  an unusual or allergic reaction to iron, benzyl alcohol, other medicines, foods, dyes, or preservatives  pregnant or are trying to become pregnant  breast-feeding How should I use this medicine? This medicine is for infusion into a vein. It is given by a health care professional in a hospital or clinic setting. Talk to your pediatrician regarding the use of this medicine in children. While this drug may be prescribed for children as young as 6 years old for selected conditions, precautions do apply. Overdosage: If you think you have taken too much of this medicine contact a poison control center or emergency room at once. NOTE: This medicine is only for you. Do not share this medicine with others. What if I miss a dose? It is important not to miss your dose. Call your doctor or health care professional if you are unable to keep an appointment. What may interact with this medicine? Do not take this medicine with any of the following medications:  deferoxamine  dimercaprol  other iron products This medicine may also interact with the following medications:  chloramphenicol  deferasirox  medicine for blood pressure like enalapril This list may not describe all possible interactions. Give your health care provider a list of all the medicines, herbs,  non-prescription drugs, or dietary supplements you use. Also tell them if you smoke, drink alcohol, or use illegal drugs. Some items may interact with your medicine. What should I watch for while using this medicine? Your condition will be monitored carefully while you are receiving this medicine. Visit your doctor for check-ups as directed. What side effects may I notice from receiving this medicine? Side effects that you should report to your doctor or health care professional as soon as possible:  allergic reactions like skin rash, itching or hives, swelling of the face, lips, or tongue  breathing problems  changes in hearing  changes in vision  chills, flushing, or sweating  fast, irregular heartbeat  feeling faint or lightheaded, falls  fever, flu-like symptoms  high or low blood pressure  pain, tingling, numbness in the hands or feet  severe pain in the chest, back, flanks, or groin  swelling of the ankles, feet, hands  trouble passing urine or change in the amount of urine  unusually weak or tired Side effects that usually do not require medical attention (report to your doctor or health care professional if they continue or are bothersome):  cramps  dark colored stools  diarrhea  headache  nausea, vomiting  stomach upset This list may not describe all possible side effects. Call your doctor for medical advice about side effects. You may report side effects to FDA at 1-800-FDA-1088. Where should I keep my medicine? This drug is given in a hospital or clinic and will not be stored at home. NOTE: This sheet is a summary. It may not cover all   possible information. If you have questions about this medicine, talk to your doctor, pharmacist, or health care provider.  2021 Elsevier/Gold Standard (2008-01-04 15:58:57)   

## 2020-06-27 NOTE — Telephone Encounter (Signed)
Called pt to inform of ECHO appt on Tue. Feb 15th at [facility]. Pt stated that she does not want to do the ECHO now. I also informed Dr. Oleh Genin.  Salvatore Marvel, CMA

## 2020-06-28 ENCOUNTER — Encounter (HOSPITAL_COMMUNITY)
Admission: RE | Admit: 2020-06-28 | Discharge: 2020-06-28 | Disposition: A | Discharge status: Home / Self Care | Payer: Medicare Other | Source: Ambulatory Visit | Attending: Nephrology | Admitting: Nephrology

## 2020-06-28 ENCOUNTER — Other Ambulatory Visit: Payer: Self-pay

## 2020-06-28 DIAGNOSIS — D631 Anemia in chronic kidney disease: Secondary | ICD-10-CM | POA: Diagnosis not present

## 2020-06-28 DIAGNOSIS — N189 Chronic kidney disease, unspecified: Secondary | ICD-10-CM | POA: Insufficient documentation

## 2020-06-28 MED ORDER — SODIUM CHLORIDE 0.9 % IV SOLN
250.0000 mg | Freq: Once | INTRAVENOUS | Status: AC
  Administered 2020-06-28: 250 mg via INTRAVENOUS
  Filled 2020-06-28: qty 20

## 2020-06-28 MED ORDER — SODIUM CHLORIDE 0.9 % IV SOLN
25.0000 mg | Freq: Once | INTRAVENOUS | Status: AC
  Administered 2020-06-28: 25 mg via INTRAVENOUS
  Filled 2020-06-28: qty 2

## 2020-06-29 ENCOUNTER — Other Ambulatory Visit: Payer: Self-pay | Admitting: Family Medicine

## 2020-06-29 DIAGNOSIS — R600 Localized edema: Secondary | ICD-10-CM

## 2020-06-29 DIAGNOSIS — N184 Chronic kidney disease, stage 4 (severe): Secondary | ICD-10-CM

## 2020-06-29 DIAGNOSIS — R0602 Shortness of breath: Secondary | ICD-10-CM

## 2020-07-01 DIAGNOSIS — Z87891 Personal history of nicotine dependence: Secondary | ICD-10-CM | POA: Diagnosis not present

## 2020-07-01 DIAGNOSIS — E1122 Type 2 diabetes mellitus with diabetic chronic kidney disease: Secondary | ICD-10-CM | POA: Diagnosis not present

## 2020-07-01 DIAGNOSIS — Z7982 Long term (current) use of aspirin: Secondary | ICD-10-CM | POA: Diagnosis not present

## 2020-07-01 DIAGNOSIS — I129 Hypertensive chronic kidney disease with stage 1 through stage 4 chronic kidney disease, or unspecified chronic kidney disease: Secondary | ICD-10-CM | POA: Diagnosis not present

## 2020-07-01 DIAGNOSIS — M1990 Unspecified osteoarthritis, unspecified site: Secondary | ICD-10-CM | POA: Diagnosis not present

## 2020-07-01 DIAGNOSIS — T402X5D Adverse effect of other opioids, subsequent encounter: Secondary | ICD-10-CM | POA: Diagnosis not present

## 2020-07-01 DIAGNOSIS — Z4789 Encounter for other orthopedic aftercare: Secondary | ICD-10-CM | POA: Diagnosis not present

## 2020-07-01 DIAGNOSIS — N184 Chronic kidney disease, stage 4 (severe): Secondary | ICD-10-CM | POA: Diagnosis not present

## 2020-07-01 DIAGNOSIS — N179 Acute kidney failure, unspecified: Secondary | ICD-10-CM | POA: Diagnosis not present

## 2020-07-01 DIAGNOSIS — M48061 Spinal stenosis, lumbar region without neurogenic claudication: Secondary | ICD-10-CM | POA: Diagnosis not present

## 2020-07-01 DIAGNOSIS — Z981 Arthrodesis status: Secondary | ICD-10-CM | POA: Diagnosis not present

## 2020-07-01 DIAGNOSIS — D649 Anemia, unspecified: Secondary | ICD-10-CM | POA: Diagnosis not present

## 2020-07-02 ENCOUNTER — Other Ambulatory Visit (HOSPITAL_COMMUNITY): Payer: Medicare Other

## 2020-07-04 ENCOUNTER — Other Ambulatory Visit: Payer: Self-pay

## 2020-07-04 ENCOUNTER — Other Ambulatory Visit: Payer: Medicare Other

## 2020-07-04 ENCOUNTER — Other Ambulatory Visit: Payer: Self-pay | Admitting: Family Medicine

## 2020-07-04 DIAGNOSIS — R0602 Shortness of breath: Secondary | ICD-10-CM

## 2020-07-04 DIAGNOSIS — N184 Chronic kidney disease, stage 4 (severe): Secondary | ICD-10-CM

## 2020-07-04 DIAGNOSIS — R600 Localized edema: Secondary | ICD-10-CM

## 2020-07-05 ENCOUNTER — Encounter (HOSPITAL_COMMUNITY)
Admission: RE | Admit: 2020-07-05 | Discharge: 2020-07-05 | Disposition: A | Discharge status: Home / Self Care | Payer: Medicare Other | Source: Ambulatory Visit | Attending: Nephrology | Admitting: Nephrology

## 2020-07-05 DIAGNOSIS — N189 Chronic kidney disease, unspecified: Secondary | ICD-10-CM | POA: Diagnosis not present

## 2020-07-05 DIAGNOSIS — D631 Anemia in chronic kidney disease: Secondary | ICD-10-CM | POA: Diagnosis not present

## 2020-07-05 LAB — BASIC METABOLIC PANEL
BUN/Creatinine Ratio: 12 (ref 12–28)
BUN: 31 mg/dL — ABNORMAL HIGH (ref 8–27)
CO2: 23 mmol/L (ref 20–29)
Calcium: 9.1 mg/dL (ref 8.7–10.3)
Chloride: 106 mmol/L (ref 96–106)
Creatinine, Ser: 2.49 mg/dL — ABNORMAL HIGH (ref 0.57–1.00)
GFR calc Af Amer: 22 mL/min/{1.73_m2} — ABNORMAL LOW (ref >59)
GFR calc non Af Amer: 19 mL/min/{1.73_m2} — ABNORMAL LOW (ref >59)
Glucose: 149 mg/dL — ABNORMAL HIGH (ref 65–99)
Potassium: 5.1 mmol/L (ref 3.5–5.2)
Sodium: 142 mmol/L (ref 134–144)

## 2020-07-05 LAB — BRAIN NATRIURETIC PEPTIDE: BNP: 61.8 pg/mL (ref 0.0–100.0)

## 2020-07-05 MED ORDER — SODIUM CHLORIDE 0.9 % IV SOLN
250.0000 mg | Freq: Once | INTRAVENOUS | Status: AC
  Administered 2020-07-05: 250 mg via INTRAVENOUS
  Filled 2020-07-05: qty 20

## 2020-07-15 ENCOUNTER — Encounter: Payer: Self-pay | Admitting: Family Medicine

## 2020-07-18 ENCOUNTER — Other Ambulatory Visit: Payer: Self-pay

## 2020-07-18 ENCOUNTER — Ambulatory Visit
Admission: RE | Admit: 2020-07-18 | Discharge: 2020-07-18 | Disposition: A | Discharge status: Home / Self Care | Payer: Medicare Other | Source: Ambulatory Visit | Attending: Neurosurgery | Admitting: Neurosurgery

## 2020-07-18 DIAGNOSIS — M48061 Spinal stenosis, lumbar region without neurogenic claudication: Secondary | ICD-10-CM | POA: Diagnosis not present

## 2020-07-18 DIAGNOSIS — M4807 Spinal stenosis, lumbosacral region: Secondary | ICD-10-CM

## 2020-07-18 DIAGNOSIS — M5136 Other intervertebral disc degeneration, lumbar region: Secondary | ICD-10-CM | POA: Diagnosis not present

## 2020-07-18 DIAGNOSIS — M5137 Other intervertebral disc degeneration, lumbosacral region: Secondary | ICD-10-CM | POA: Diagnosis not present

## 2020-07-18 MED ORDER — GADOBENATE DIMEGLUMINE 529 MG/ML IV SOLN
20.0000 mL | Freq: Once | INTRAVENOUS | Status: AC | PRN
  Administered 2020-07-18: 20 mL via INTRAVENOUS

## 2020-07-19 DIAGNOSIS — N184 Chronic kidney disease, stage 4 (severe): Secondary | ICD-10-CM | POA: Diagnosis not present

## 2020-07-19 DIAGNOSIS — T402X5D Adverse effect of other opioids, subsequent encounter: Secondary | ICD-10-CM | POA: Diagnosis not present

## 2020-07-19 DIAGNOSIS — Z7982 Long term (current) use of aspirin: Secondary | ICD-10-CM | POA: Diagnosis not present

## 2020-07-19 DIAGNOSIS — M1990 Unspecified osteoarthritis, unspecified site: Secondary | ICD-10-CM | POA: Diagnosis not present

## 2020-07-19 DIAGNOSIS — I129 Hypertensive chronic kidney disease with stage 1 through stage 4 chronic kidney disease, or unspecified chronic kidney disease: Secondary | ICD-10-CM | POA: Diagnosis not present

## 2020-07-19 DIAGNOSIS — E1122 Type 2 diabetes mellitus with diabetic chronic kidney disease: Secondary | ICD-10-CM | POA: Diagnosis not present

## 2020-07-19 DIAGNOSIS — N179 Acute kidney failure, unspecified: Secondary | ICD-10-CM | POA: Diagnosis not present

## 2020-07-19 DIAGNOSIS — Z87891 Personal history of nicotine dependence: Secondary | ICD-10-CM | POA: Diagnosis not present

## 2020-07-19 DIAGNOSIS — M48061 Spinal stenosis, lumbar region without neurogenic claudication: Secondary | ICD-10-CM | POA: Diagnosis not present

## 2020-07-19 DIAGNOSIS — Z981 Arthrodesis status: Secondary | ICD-10-CM | POA: Diagnosis not present

## 2020-07-19 DIAGNOSIS — D649 Anemia, unspecified: Secondary | ICD-10-CM | POA: Diagnosis not present

## 2020-07-19 DIAGNOSIS — Z4789 Encounter for other orthopedic aftercare: Secondary | ICD-10-CM | POA: Diagnosis not present

## 2020-07-24 DIAGNOSIS — M48061 Spinal stenosis, lumbar region without neurogenic claudication: Secondary | ICD-10-CM | POA: Diagnosis not present

## 2020-07-24 DIAGNOSIS — N184 Chronic kidney disease, stage 4 (severe): Secondary | ICD-10-CM | POA: Diagnosis not present

## 2020-07-24 DIAGNOSIS — N179 Acute kidney failure, unspecified: Secondary | ICD-10-CM | POA: Diagnosis not present

## 2020-07-24 DIAGNOSIS — D631 Anemia in chronic kidney disease: Secondary | ICD-10-CM | POA: Diagnosis not present

## 2020-07-24 DIAGNOSIS — I1310 Hypertensive heart and chronic kidney disease without heart failure, with stage 1 through stage 4 chronic kidney disease, or unspecified chronic kidney disease: Secondary | ICD-10-CM | POA: Diagnosis not present

## 2020-07-24 DIAGNOSIS — E1122 Type 2 diabetes mellitus with diabetic chronic kidney disease: Secondary | ICD-10-CM | POA: Diagnosis not present

## 2020-07-26 DIAGNOSIS — E1122 Type 2 diabetes mellitus with diabetic chronic kidney disease: Secondary | ICD-10-CM | POA: Diagnosis not present

## 2020-07-26 DIAGNOSIS — N179 Acute kidney failure, unspecified: Secondary | ICD-10-CM | POA: Diagnosis not present

## 2020-07-26 DIAGNOSIS — I1310 Hypertensive heart and chronic kidney disease without heart failure, with stage 1 through stage 4 chronic kidney disease, or unspecified chronic kidney disease: Secondary | ICD-10-CM | POA: Diagnosis not present

## 2020-07-26 DIAGNOSIS — N184 Chronic kidney disease, stage 4 (severe): Secondary | ICD-10-CM | POA: Diagnosis not present

## 2020-07-26 DIAGNOSIS — M48061 Spinal stenosis, lumbar region without neurogenic claudication: Secondary | ICD-10-CM | POA: Diagnosis not present

## 2020-07-26 DIAGNOSIS — D631 Anemia in chronic kidney disease: Secondary | ICD-10-CM | POA: Diagnosis not present

## 2020-07-29 DIAGNOSIS — M48061 Spinal stenosis, lumbar region without neurogenic claudication: Secondary | ICD-10-CM | POA: Diagnosis not present

## 2020-07-29 DIAGNOSIS — I1310 Hypertensive heart and chronic kidney disease without heart failure, with stage 1 through stage 4 chronic kidney disease, or unspecified chronic kidney disease: Secondary | ICD-10-CM | POA: Diagnosis not present

## 2020-07-29 DIAGNOSIS — N184 Chronic kidney disease, stage 4 (severe): Secondary | ICD-10-CM | POA: Diagnosis not present

## 2020-07-29 DIAGNOSIS — N179 Acute kidney failure, unspecified: Secondary | ICD-10-CM | POA: Diagnosis not present

## 2020-07-29 DIAGNOSIS — D631 Anemia in chronic kidney disease: Secondary | ICD-10-CM | POA: Diagnosis not present

## 2020-07-29 DIAGNOSIS — E1122 Type 2 diabetes mellitus with diabetic chronic kidney disease: Secondary | ICD-10-CM | POA: Diagnosis not present

## 2020-07-31 DIAGNOSIS — E1122 Type 2 diabetes mellitus with diabetic chronic kidney disease: Secondary | ICD-10-CM | POA: Diagnosis not present

## 2020-07-31 DIAGNOSIS — D631 Anemia in chronic kidney disease: Secondary | ICD-10-CM | POA: Diagnosis not present

## 2020-07-31 DIAGNOSIS — N184 Chronic kidney disease, stage 4 (severe): Secondary | ICD-10-CM | POA: Diagnosis not present

## 2020-07-31 DIAGNOSIS — I1310 Hypertensive heart and chronic kidney disease without heart failure, with stage 1 through stage 4 chronic kidney disease, or unspecified chronic kidney disease: Secondary | ICD-10-CM | POA: Diagnosis not present

## 2020-07-31 DIAGNOSIS — M48061 Spinal stenosis, lumbar region without neurogenic claudication: Secondary | ICD-10-CM | POA: Diagnosis not present

## 2020-07-31 DIAGNOSIS — N179 Acute kidney failure, unspecified: Secondary | ICD-10-CM | POA: Diagnosis not present

## 2020-08-07 ENCOUNTER — Telehealth: Payer: Self-pay

## 2020-08-07 DIAGNOSIS — N184 Chronic kidney disease, stage 4 (severe): Secondary | ICD-10-CM | POA: Diagnosis not present

## 2020-08-07 DIAGNOSIS — E1122 Type 2 diabetes mellitus with diabetic chronic kidney disease: Secondary | ICD-10-CM | POA: Diagnosis not present

## 2020-08-07 DIAGNOSIS — I1310 Hypertensive heart and chronic kidney disease without heart failure, with stage 1 through stage 4 chronic kidney disease, or unspecified chronic kidney disease: Secondary | ICD-10-CM | POA: Diagnosis not present

## 2020-08-07 DIAGNOSIS — N179 Acute kidney failure, unspecified: Secondary | ICD-10-CM | POA: Diagnosis not present

## 2020-08-07 DIAGNOSIS — D631 Anemia in chronic kidney disease: Secondary | ICD-10-CM | POA: Diagnosis not present

## 2020-08-07 DIAGNOSIS — M48061 Spinal stenosis, lumbar region without neurogenic claudication: Secondary | ICD-10-CM

## 2020-08-07 NOTE — Telephone Encounter (Signed)
Patient calls nurse line requesting 4 wheeled rollator walker with seat.   Please route message back to "RN team" once order has been placed.   Talbot Grumbling, RN

## 2020-08-08 NOTE — Telephone Encounter (Signed)
Community message sent to [employer], Skeet Latch and Alyse Low @ [facility] to process DME order for 4 wheel rolling walker.   Christen Bame, CMA

## 2020-08-08 NOTE — Telephone Encounter (Signed)
Received by [facility], they will process now. Christen Bame, CMA

## 2020-08-09 DIAGNOSIS — I1310 Hypertensive heart and chronic kidney disease without heart failure, with stage 1 through stage 4 chronic kidney disease, or unspecified chronic kidney disease: Secondary | ICD-10-CM | POA: Diagnosis not present

## 2020-08-09 DIAGNOSIS — D631 Anemia in chronic kidney disease: Secondary | ICD-10-CM | POA: Diagnosis not present

## 2020-08-09 DIAGNOSIS — E1122 Type 2 diabetes mellitus with diabetic chronic kidney disease: Secondary | ICD-10-CM | POA: Diagnosis not present

## 2020-08-09 DIAGNOSIS — N184 Chronic kidney disease, stage 4 (severe): Secondary | ICD-10-CM | POA: Diagnosis not present

## 2020-08-09 DIAGNOSIS — N179 Acute kidney failure, unspecified: Secondary | ICD-10-CM | POA: Diagnosis not present

## 2020-08-09 DIAGNOSIS — M48061 Spinal stenosis, lumbar region without neurogenic claudication: Secondary | ICD-10-CM | POA: Diagnosis not present

## 2020-08-12 DIAGNOSIS — E1142 Type 2 diabetes mellitus with diabetic polyneuropathy: Secondary | ICD-10-CM | POA: Diagnosis not present

## 2020-08-14 DIAGNOSIS — M48062 Spinal stenosis, lumbar region with neurogenic claudication: Secondary | ICD-10-CM | POA: Diagnosis not present

## 2020-08-15 DIAGNOSIS — M48061 Spinal stenosis, lumbar region without neurogenic claudication: Secondary | ICD-10-CM | POA: Diagnosis not present

## 2020-08-15 DIAGNOSIS — N179 Acute kidney failure, unspecified: Secondary | ICD-10-CM | POA: Diagnosis not present

## 2020-08-15 DIAGNOSIS — N184 Chronic kidney disease, stage 4 (severe): Secondary | ICD-10-CM | POA: Diagnosis not present

## 2020-08-15 DIAGNOSIS — E1122 Type 2 diabetes mellitus with diabetic chronic kidney disease: Secondary | ICD-10-CM | POA: Diagnosis not present

## 2020-08-15 DIAGNOSIS — D631 Anemia in chronic kidney disease: Secondary | ICD-10-CM | POA: Diagnosis not present

## 2020-08-15 DIAGNOSIS — I1310 Hypertensive heart and chronic kidney disease without heart failure, with stage 1 through stage 4 chronic kidney disease, or unspecified chronic kidney disease: Secondary | ICD-10-CM | POA: Diagnosis not present

## 2020-08-21 DIAGNOSIS — N179 Acute kidney failure, unspecified: Secondary | ICD-10-CM | POA: Diagnosis not present

## 2020-08-21 DIAGNOSIS — I1310 Hypertensive heart and chronic kidney disease without heart failure, with stage 1 through stage 4 chronic kidney disease, or unspecified chronic kidney disease: Secondary | ICD-10-CM | POA: Diagnosis not present

## 2020-08-21 DIAGNOSIS — N184 Chronic kidney disease, stage 4 (severe): Secondary | ICD-10-CM | POA: Diagnosis not present

## 2020-08-21 DIAGNOSIS — E1122 Type 2 diabetes mellitus with diabetic chronic kidney disease: Secondary | ICD-10-CM | POA: Diagnosis not present

## 2020-08-21 DIAGNOSIS — D631 Anemia in chronic kidney disease: Secondary | ICD-10-CM | POA: Diagnosis not present

## 2020-08-21 DIAGNOSIS — M48061 Spinal stenosis, lumbar region without neurogenic claudication: Secondary | ICD-10-CM | POA: Diagnosis not present

## 2020-08-22 DIAGNOSIS — M4807 Spinal stenosis, lumbosacral region: Secondary | ICD-10-CM | POA: Diagnosis not present

## 2020-08-28 DIAGNOSIS — M48061 Spinal stenosis, lumbar region without neurogenic claudication: Secondary | ICD-10-CM | POA: Diagnosis not present

## 2020-08-28 DIAGNOSIS — I1310 Hypertensive heart and chronic kidney disease without heart failure, with stage 1 through stage 4 chronic kidney disease, or unspecified chronic kidney disease: Secondary | ICD-10-CM | POA: Diagnosis not present

## 2020-08-28 DIAGNOSIS — N179 Acute kidney failure, unspecified: Secondary | ICD-10-CM | POA: Diagnosis not present

## 2020-08-28 DIAGNOSIS — D631 Anemia in chronic kidney disease: Secondary | ICD-10-CM | POA: Diagnosis not present

## 2020-08-28 DIAGNOSIS — N184 Chronic kidney disease, stage 4 (severe): Secondary | ICD-10-CM | POA: Diagnosis not present

## 2020-08-28 DIAGNOSIS — E1122 Type 2 diabetes mellitus with diabetic chronic kidney disease: Secondary | ICD-10-CM | POA: Diagnosis not present

## 2020-09-03 DIAGNOSIS — D631 Anemia in chronic kidney disease: Secondary | ICD-10-CM | POA: Diagnosis not present

## 2020-09-03 DIAGNOSIS — M48061 Spinal stenosis, lumbar region without neurogenic claudication: Secondary | ICD-10-CM | POA: Diagnosis not present

## 2020-09-03 DIAGNOSIS — I1310 Hypertensive heart and chronic kidney disease without heart failure, with stage 1 through stage 4 chronic kidney disease, or unspecified chronic kidney disease: Secondary | ICD-10-CM | POA: Diagnosis not present

## 2020-09-03 DIAGNOSIS — N179 Acute kidney failure, unspecified: Secondary | ICD-10-CM | POA: Diagnosis not present

## 2020-09-03 DIAGNOSIS — E1122 Type 2 diabetes mellitus with diabetic chronic kidney disease: Secondary | ICD-10-CM | POA: Diagnosis not present

## 2020-09-03 DIAGNOSIS — N184 Chronic kidney disease, stage 4 (severe): Secondary | ICD-10-CM | POA: Diagnosis not present

## 2020-09-10 ENCOUNTER — Other Ambulatory Visit: Payer: Self-pay | Admitting: Family Medicine

## 2020-09-10 DIAGNOSIS — I10 Essential (primary) hypertension: Secondary | ICD-10-CM

## 2020-09-23 NOTE — Progress Notes (Deleted)
    SUBJECTIVE:   CHIEF COMPLAINT / HPI:   Uti? Swelling in legs  UTI concern:  Swelling in legs:  DM: Last saw ophthalmology ***. Last A1c  TN: presently using ***  Health care maintenace: recommend TDAP, 3rd dose of COVID-19 vaccine  PERTINENT  PMH / PSH: **  OBJECTIVE:   There were no vitals taken for this visit.  ***  ASSESSMENT/PLAN:   No problem-specific Assessment & Plan notes found for this encounter.     Gladys Damme, MD [place]   {    This will disappear when note is signed, click to select method of visit    :1}

## 2020-09-24 ENCOUNTER — Ambulatory Visit (INDEPENDENT_AMBULATORY_CARE_PROVIDER_SITE_OTHER): Payer: Medicare Other | Admitting: Family Medicine

## 2020-09-24 ENCOUNTER — Other Ambulatory Visit: Payer: Self-pay

## 2020-09-24 ENCOUNTER — Encounter: Payer: Self-pay | Admitting: Family Medicine

## 2020-09-24 VITALS — BP 144/68 | HR 75 | Ht 63.0 in | Wt 244.4 lb

## 2020-09-24 DIAGNOSIS — R32 Unspecified urinary incontinence: Secondary | ICD-10-CM

## 2020-09-24 DIAGNOSIS — Z794 Long term (current) use of insulin: Secondary | ICD-10-CM

## 2020-09-24 DIAGNOSIS — R300 Dysuria: Secondary | ICD-10-CM

## 2020-09-24 DIAGNOSIS — R8290 Unspecified abnormal findings in urine: Secondary | ICD-10-CM | POA: Diagnosis not present

## 2020-09-24 DIAGNOSIS — M7989 Other specified soft tissue disorders: Secondary | ICD-10-CM | POA: Diagnosis not present

## 2020-09-24 DIAGNOSIS — E1140 Type 2 diabetes mellitus with diabetic neuropathy, unspecified: Secondary | ICD-10-CM | POA: Diagnosis not present

## 2020-09-24 DIAGNOSIS — I10 Essential (primary) hypertension: Secondary | ICD-10-CM | POA: Diagnosis not present

## 2020-09-24 LAB — POCT URINALYSIS DIP (MANUAL ENTRY)
Bilirubin, UA: NEGATIVE
Glucose, UA: NEGATIVE mg/dL
Ketones, POC UA: NEGATIVE mg/dL
Leukocytes, UA: NEGATIVE
Nitrite, UA: NEGATIVE
Protein Ur, POC: >=300 mg/dL — AB
Spec Grav, UA: 1.025 (ref 1.010–1.025)
Urobilinogen, UA: 0.2 E.U./dL
pH, UA: 5.5 (ref 5.0–8.0)

## 2020-09-24 LAB — POCT UA - MICROSCOPIC ONLY

## 2020-09-24 LAB — POCT GLYCOSYLATED HEMOGLOBIN (HGB A1C): HbA1c, POC (controlled diabetic range): 6.4 % (ref 0.0–7.0)

## 2020-09-24 NOTE — Patient Instructions (Signed)
It was a pleasure to see you today!  1. Please call [facility] to get an appointment for Thursday with Dr. Weston Settle to follow up for incontinence  2. Great work with your diabetes! Your a1c improved tremendously!  3. Please check your blood pressure every day at the same time in the evening when things are calm and write down the number for 1 week, then call me or send me a MyChart message letting me know the numbers. Based on these numbers, I will know whether to add another blood pressure medication.  4. Your leg swelling is likely due to vascular insufficiency. I recommend wearing compression stockings during the day to prevent leg swelling.  5. Follow up in 1 month to check in your blood pressure and leg swelling  Be Well,  Dr. Chauncey Reading

## 2020-09-24 NOTE — Progress Notes (Signed)
SUBJECTIVE:   CHIEF COMPLAINT / HPI:   Michelle Abbott returns today for follow up about her bilateral leg swelling, incontinence, and cloudy urine.  Leg swelling Michelle Abbott notes that she has had bilateral leg swelling since her back surgery in December. Leg swelling is better in the morning, worse at night. No SOB or palpitations. Had venous US with neurosurgery in February which was negative. Has compression socks at home which she has not yet used.  Incontinence The pt notes that she has been incontinent since her December lumbar back surgery. She notes she is incontinent daily and sometimes is able to make it to the bathroom when she feels the need. She has continued with Kegel exercises without improvement.  She had genital numbness after her back surgery as well but notes that this has been slowly improving. MRI Lumbar spine completed on 07/19/20 which showed diffuse bulging at L2-3 and mild progression of moderate spinal stenosis since prior MRI- both of which were above the level of surgical intervention.  Cloudy urine Michelle Abbott notes that she has had a few days of cloudy urine. No dysuria, burning, itching, irritation, fevers or chills.  HTN Her BP is 144/68 today and she continues on chlorthalidone 25mg , Losartan 100mg , and Coreg 25mg . She also had a BP reading with systolic 270W at her neurosurgeon's office in the interim. Has a BP cuff at home which she hasn't used yet.   PERTINENT  PMH / PSH:  HTN Lumbar surgery  OBJECTIVE:   BP (!) 144/68   Pulse 75   Ht 5\' 3"  (1.6 m)   Wt 244 lb 6 oz (110.8 kg)   SpO2 99%   BMI 43.29 kg/m   Physical Exam Constitutional:      Appearance: Normal appearance.  Cardiovascular:     Rate and Rhythm: Normal rate and regular rhythm.     Heart sounds: Normal heart sounds.  Pulmonary:     Effort: Pulmonary effort is normal.     Breath sounds: Normal breath sounds.  Musculoskeletal:     Right lower leg: Edema present.     Left  lower leg: Edema present.  Skin:    General: Skin is warm and dry.  Neurological:     Mental Status: She is alert and oriented to person, place, and time.     Sensory: No sensory deficit.     Motor: No weakness.  Psychiatric:        Mood and Affect: Mood normal.        Behavior: Behavior normal.      ASSESSMENT/PLAN:   Leg swelling Bilateral leg swelling, worse in evening and better in morning. Previously evaluated with negative dopplers at neurosurgery. No SOB, warmth or tenderness.  Recommended that the pt begin using compression socks which she has at home  Essential hypertension 144/68 today, though saw that systolic was in 237S in interim at neurosurgery's office. Continues on 25mg  Chlorthalidone, 100mg  Losartan and 25mg  Coreg. Asked pt to check and record BP at home and bring to next visit.  Incontinence Daily incontinence with features of urgency x 5 months. Did have features of saddle anesthesia after lumbar surgery in December, but this has improved over time per pt. Spoke with Neurosurgeon Dr. Kary Kos who reviewed recent March MRI- felt that spinal canal was patent and that the present picture is not consistent with cauda equina.  Dr. Saintclair Halsted offered to see the pt in his office this Thursday and considering follow up CT  scans post surgery.  Cloudy urine Pt without features consistent with typical UTI but noted concern for a UTI Collected UA in clinic today for rule out of UTI which showed trace blood and protein but no leukocytes, nitrites or WBCs.     [place]

## 2020-09-24 NOTE — Assessment & Plan Note (Addendum)
Pt denies dysuria and frequency, endorses occasional urgency (present since lumbar surgery in December 2021), but noted concern for a UTI. Collected UA in clinic today for rule out of UTI which showed trace blood and protein but no leukocytes, nitrites or WBCs, making UTI unlikely. Due to proteinuria, recommend patient follow up with nephrology (h/o CKD), she states she has an appt on 5/17 with [facility].

## 2020-09-24 NOTE — Assessment & Plan Note (Addendum)
Bilateral leg swelling, worse in evening and better in morning. Previously evaluated with negative dopplers at neurosurgery. No SOB, warmth or tenderness. Strongly suspect venous insufficiency. Recommended that the pt begin using compression socks which she has at home

## 2020-09-24 NOTE — Assessment & Plan Note (Addendum)
Daily incontinence with features of urgency x 5 months. Did have features of saddle anesthesia after lumbar surgery in December, but this has improved over time per pt. She does not have full sensation of her genitals as she once did, but reports her sensation is much closer to normal now than previously. Patient has tried kegels without improvement, by symptoms (patient cannot always tell when she is going to have incontinence, only occasionally an urgency) and no history of urgency or stress urinary incontinence prior to surgery and no improvement with exercise indicates that we should rule out neurologic cause of incontinence before treating and potentially masking underlying problem. Reviewed MRI from March 2022 which found likely seroma (3cmx8cm), considered likely CSF leak per radiology report. Spoke with Neurosurgeon Dr. Kary Kos who reviewed recent March MRI- felt that spinal canal was patent and that the present picture is not consistent with cauda equina.  Dr. Saintclair Halsted offered to see the pt in his office this Thursday and considering follow up CT scans post surgery. Will ensure patient has close follow up. Can consider urology referral.

## 2020-09-24 NOTE — Assessment & Plan Note (Addendum)
144/68 today, though saw that systolic was in 111B in interim at neurosurgery's office. Continues on 25mg  Chlorthalidone, 100mg  Losartan and 25mg  Coreg. Patient did not tolerate amlodipine due to leg swelling, clearly not a good choice in present setting. Likely next would be CCB (not amlodipine). Asked pt to check and record BP at home x1 week and send numbers via MyChart. Follow up in 1 month.

## 2020-10-01 DIAGNOSIS — Z9189 Other specified personal risk factors, not elsewhere classified: Secondary | ICD-10-CM | POA: Diagnosis not present

## 2020-10-03 ENCOUNTER — Other Ambulatory Visit: Payer: Self-pay | Admitting: Neurosurgery

## 2020-10-03 DIAGNOSIS — M4807 Spinal stenosis, lumbosacral region: Secondary | ICD-10-CM

## 2020-10-07 DIAGNOSIS — N1832 Chronic kidney disease, stage 3b: Secondary | ICD-10-CM | POA: Diagnosis not present

## 2020-10-07 DIAGNOSIS — N184 Chronic kidney disease, stage 4 (severe): Secondary | ICD-10-CM | POA: Diagnosis not present

## 2020-10-07 DIAGNOSIS — I129 Hypertensive chronic kidney disease with stage 1 through stage 4 chronic kidney disease, or unspecified chronic kidney disease: Secondary | ICD-10-CM | POA: Diagnosis not present

## 2020-10-07 DIAGNOSIS — N189 Chronic kidney disease, unspecified: Secondary | ICD-10-CM | POA: Diagnosis not present

## 2020-10-07 DIAGNOSIS — D631 Anemia in chronic kidney disease: Secondary | ICD-10-CM | POA: Diagnosis not present

## 2020-10-07 DIAGNOSIS — N2581 Secondary hyperparathyroidism of renal origin: Secondary | ICD-10-CM | POA: Diagnosis not present

## 2020-10-07 DIAGNOSIS — E1122 Type 2 diabetes mellitus with diabetic chronic kidney disease: Secondary | ICD-10-CM | POA: Diagnosis not present

## 2020-10-09 ENCOUNTER — Telehealth: Payer: Self-pay

## 2020-10-09 NOTE — Telephone Encounter (Signed)
Patient returned call to nurse line. Informed of below.   Talbot Grumbling, RN

## 2020-10-09 NOTE — Telephone Encounter (Signed)
Patient LVM on nurse line stating that insurance needed a prior authorization on Ozempic.   Called pharmacy. Per pharmacist, medication is being processed through insurance and does not need a prior authorization.   Attempted to call patient to provide update. Patient did not answer, left HIPAA compliant VM for patient to return call to office.   Talbot Grumbling, RN

## 2020-10-15 DIAGNOSIS — N1832 Chronic kidney disease, stage 3b: Secondary | ICD-10-CM | POA: Diagnosis not present

## 2020-10-18 DIAGNOSIS — H43823 Vitreomacular adhesion, bilateral: Secondary | ICD-10-CM | POA: Diagnosis not present

## 2020-10-18 DIAGNOSIS — E113312 Type 2 diabetes mellitus with moderate nonproliferative diabetic retinopathy with macular edema, left eye: Secondary | ICD-10-CM | POA: Diagnosis not present

## 2020-10-18 DIAGNOSIS — H43813 Vitreous degeneration, bilateral: Secondary | ICD-10-CM | POA: Diagnosis not present

## 2020-10-18 DIAGNOSIS — E113391 Type 2 diabetes mellitus with moderate nonproliferative diabetic retinopathy without macular edema, right eye: Secondary | ICD-10-CM | POA: Diagnosis not present

## 2020-10-21 ENCOUNTER — Ambulatory Visit
Admission: RE | Admit: 2020-10-21 | Discharge: 2020-10-21 | Disposition: A | Discharge status: Home / Self Care | Payer: Medicare Other | Source: Ambulatory Visit | Attending: Neurosurgery | Admitting: Neurosurgery

## 2020-10-21 ENCOUNTER — Other Ambulatory Visit: Payer: Self-pay

## 2020-10-21 DIAGNOSIS — M4807 Spinal stenosis, lumbosacral region: Secondary | ICD-10-CM

## 2020-10-21 DIAGNOSIS — M545 Low back pain, unspecified: Secondary | ICD-10-CM | POA: Diagnosis not present

## 2020-10-25 ENCOUNTER — Ambulatory Visit (HOSPITAL_COMMUNITY)
Admission: EM | Admit: 2020-10-25 | Discharge: 2020-10-25 | Disposition: A | Discharge status: Home / Self Care | Payer: Medicare Other

## 2020-10-31 ENCOUNTER — Telehealth: Payer: Self-pay

## 2020-10-31 NOTE — Telephone Encounter (Signed)
Patient calls nurse line regarding positive COVID results. Patient reports that positive result was on 6/9. Patient states that she continues to have cough and nasal congestion. She is using OTC Mucinex for symptom management, however, cough is persistent, especially when lying down.   Reports that cough is productive with yellow sputum. Due to worsened cough and sputum production, recommended patient be evaluated in clinic. Scheduled patient in [place] [facility] tomorrow afternoon for further evaluation.   Talbot Grumbling, RN

## 2020-11-01 ENCOUNTER — Ambulatory Visit (INDEPENDENT_AMBULATORY_CARE_PROVIDER_SITE_OTHER): Payer: Medicare Other | Admitting: Family Medicine

## 2020-11-01 ENCOUNTER — Other Ambulatory Visit: Payer: Self-pay

## 2020-11-01 DIAGNOSIS — I10 Essential (primary) hypertension: Secondary | ICD-10-CM

## 2020-11-01 DIAGNOSIS — U071 COVID-19: Secondary | ICD-10-CM | POA: Diagnosis not present

## 2020-11-01 MED ORDER — AFRIN NASAL SPRAY 0.05 % NA SOLN
1.0000 | Freq: Two times a day (BID) | NASAL | 0 refills | Status: DC
Start: 2020-11-01 — End: 2021-03-27

## 2020-11-01 NOTE — Assessment & Plan Note (Signed)
Not controlled today. Continue current medications, follow up in 2-3 weeks when no longer acutely ill with COVID.

## 2020-11-01 NOTE — Progress Notes (Signed)
    SUBJECTIVE:   CHIEF COMPLAINT / HPI:   COVID-19 infection: Patient was exposed by her daughter and tested positive on 10/24/20. She initially had fever and chills, but these have gone away. She has a very congested sinus passage with ear pain and cough that is productive of phlegm. Cough is worst at night and in the morning. Phlegm production is yellow/green. She is able to speak in full sentences and do her usual activities; able to tolerate PO. She had 2 doses of pfizer in February and March 2021. She has been using nasal mucinex and flonase.  HTN: patient has elevated BP today to 169/74. She denies any headaches, dizziness, or other symptoms. She is on amlodipine 10 mg, losartan 100 mg, HCTZ 25 mg. She reports compliance with medications.  PERTINENT  PMH / PSH: DM, obesity, CKD 4, spondylolisthesis s/p lumbar fusion, HTN  OBJECTIVE:   BP (!) 169/73   Pulse 75   Temp 98.4 F (36.9 C) (Oral)   Ht 5\' 3"  (1.6 m)   SpO2 100%   BMI 43.29 kg/m   Nursing note and vitals reviewed GEN: appears younger than stated age 75. Resting comfortably in chair, NAD, obese HEENT: NCAT. PERRLA. Sclera without injection or icterus. MMM. Clear oropharynx. Edematous and erythematous nasal sinus passages b/l with clear, thin secretions. TM's w/o erythema or edema b/l. Neck: Supple. No LAD. Cardiac: Regular rate and rhythm. Normal S1/S2. No murmurs, rubs, or gallops appreciated. 2+ radial pulses. Lungs: Clear bilaterally to ascultation. No increased WOB, no accessory muscle usage. No w/r/r. Neuro: Alert and at baseline Ext: no edema Psych: Pleasant and appropriate   ASSESSMENT/PLAN:   Essential hypertension Not controlled today. Continue current medications, follow up in 2-3 weeks when no longer acutely ill with COVID.  COVID-19 virus infection Day 9 after positive test; patient able to go to appointments or shop if needed, counseled on mask wearing, handwashing. Patient able to speak in full  sentences, VSS, SpO2 99%. Mostly concerned for symptomatic management. Discussed expectations and timeline of recurring from viral URI, cough may last 3-8 weeks. Most of symptoms are nasal congestion and likely PND as cough is worst at night and in the morning. Recommend nasal saline rinse to help reduce mucus drainage, afrin nasal spray x max of 72 hours to prevent rebound sinusitis. Discussed return precautions.     Gladys Damme, MD [place]

## 2020-11-01 NOTE — Patient Instructions (Signed)
It was a pleasure to see you today!  For your nasal congestion: try using afrin spray- one time in each nostril in the morning- for 3 days. Do not use longer than three days as you can have a rebound sinus infection. You can also use this nasal saline rinse to get out mucus in your nose to decrease coughing at night and in morning. Only use boiled tap water or sterile distilled water. Your blood pressure is a little high today, but it is not best for [place] to change medications when you have so recently been sick. Make a follow up appt with me in a couple of weeks.   Be Well,  Dr. Chauncey Reading

## 2020-11-01 NOTE — Assessment & Plan Note (Addendum)
Day 9 after positive test; patient able to go to appointments or shop if needed, counseled on mask wearing, handwashing. Patient able to speak in full sentences, VSS, SpO2 99%. Mostly concerned for symptomatic management. Discussed expectations and timeline of recurring from viral URI, cough may last 3-8 weeks. Most of symptoms are nasal congestion and likely PND as cough is worst at night and in the morning. Recommend nasal saline rinse to help reduce mucus drainage, afrin nasal spray x max of 72 hours to prevent rebound sinusitis. Discussed return precautions.

## 2020-11-05 DIAGNOSIS — R3121 Asymptomatic microscopic hematuria: Secondary | ICD-10-CM | POA: Diagnosis not present

## 2020-11-05 DIAGNOSIS — R3915 Urgency of urination: Secondary | ICD-10-CM | POA: Diagnosis not present

## 2020-11-05 DIAGNOSIS — R350 Frequency of micturition: Secondary | ICD-10-CM | POA: Diagnosis not present

## 2020-11-12 DIAGNOSIS — E113213 Type 2 diabetes mellitus with mild nonproliferative diabetic retinopathy with macular edema, bilateral: Secondary | ICD-10-CM | POA: Diagnosis not present

## 2020-11-13 DIAGNOSIS — N2581 Secondary hyperparathyroidism of renal origin: Secondary | ICD-10-CM | POA: Diagnosis not present

## 2020-11-13 DIAGNOSIS — D631 Anemia in chronic kidney disease: Secondary | ICD-10-CM | POA: Diagnosis not present

## 2020-11-13 DIAGNOSIS — E1122 Type 2 diabetes mellitus with diabetic chronic kidney disease: Secondary | ICD-10-CM | POA: Diagnosis not present

## 2020-11-13 DIAGNOSIS — N179 Acute kidney failure, unspecified: Secondary | ICD-10-CM | POA: Diagnosis not present

## 2020-11-13 DIAGNOSIS — I129 Hypertensive chronic kidney disease with stage 1 through stage 4 chronic kidney disease, or unspecified chronic kidney disease: Secondary | ICD-10-CM | POA: Diagnosis not present

## 2020-11-13 DIAGNOSIS — N1832 Chronic kidney disease, stage 3b: Secondary | ICD-10-CM | POA: Diagnosis not present

## 2020-11-15 ENCOUNTER — Other Ambulatory Visit: Payer: Self-pay | Admitting: Family Medicine

## 2020-11-28 DIAGNOSIS — N1832 Chronic kidney disease, stage 3b: Secondary | ICD-10-CM | POA: Diagnosis not present

## 2020-11-28 DIAGNOSIS — I129 Hypertensive chronic kidney disease with stage 1 through stage 4 chronic kidney disease, or unspecified chronic kidney disease: Secondary | ICD-10-CM | POA: Diagnosis not present

## 2020-12-11 DIAGNOSIS — E1151 Type 2 diabetes mellitus with diabetic peripheral angiopathy without gangrene: Secondary | ICD-10-CM | POA: Diagnosis not present

## 2020-12-11 DIAGNOSIS — L603 Nail dystrophy: Secondary | ICD-10-CM | POA: Diagnosis not present

## 2020-12-11 DIAGNOSIS — I739 Peripheral vascular disease, unspecified: Secondary | ICD-10-CM | POA: Diagnosis not present

## 2020-12-18 DIAGNOSIS — R3915 Urgency of urination: Secondary | ICD-10-CM | POA: Diagnosis not present

## 2020-12-18 DIAGNOSIS — R3121 Asymptomatic microscopic hematuria: Secondary | ICD-10-CM | POA: Diagnosis not present

## 2020-12-24 DIAGNOSIS — I10 Essential (primary) hypertension: Secondary | ICD-10-CM | POA: Diagnosis not present

## 2020-12-24 DIAGNOSIS — M4807 Spinal stenosis, lumbosacral region: Secondary | ICD-10-CM | POA: Diagnosis not present

## 2020-12-24 DIAGNOSIS — M4316 Spondylolisthesis, lumbar region: Secondary | ICD-10-CM | POA: Diagnosis not present

## 2021-01-02 DIAGNOSIS — R3121 Asymptomatic microscopic hematuria: Secondary | ICD-10-CM | POA: Diagnosis not present

## 2021-01-24 DIAGNOSIS — R350 Frequency of micturition: Secondary | ICD-10-CM | POA: Diagnosis not present

## 2021-01-24 DIAGNOSIS — R3121 Asymptomatic microscopic hematuria: Secondary | ICD-10-CM | POA: Diagnosis not present

## 2021-01-24 DIAGNOSIS — R3915 Urgency of urination: Secondary | ICD-10-CM | POA: Diagnosis not present

## 2021-02-05 DIAGNOSIS — I129 Hypertensive chronic kidney disease with stage 1 through stage 4 chronic kidney disease, or unspecified chronic kidney disease: Secondary | ICD-10-CM | POA: Diagnosis not present

## 2021-02-05 DIAGNOSIS — N189 Chronic kidney disease, unspecified: Secondary | ICD-10-CM | POA: Diagnosis not present

## 2021-02-05 DIAGNOSIS — N1832 Chronic kidney disease, stage 3b: Secondary | ICD-10-CM | POA: Diagnosis not present

## 2021-02-05 DIAGNOSIS — N2581 Secondary hyperparathyroidism of renal origin: Secondary | ICD-10-CM | POA: Diagnosis not present

## 2021-02-05 DIAGNOSIS — D631 Anemia in chronic kidney disease: Secondary | ICD-10-CM | POA: Diagnosis not present

## 2021-02-26 ENCOUNTER — Telehealth: Payer: Self-pay

## 2021-03-25 DIAGNOSIS — M4316 Spondylolisthesis, lumbar region: Secondary | ICD-10-CM | POA: Diagnosis not present

## 2021-03-26 ENCOUNTER — Other Ambulatory Visit: Payer: Self-pay | Admitting: Family Medicine

## 2021-03-26 DIAGNOSIS — E1140 Type 2 diabetes mellitus with diabetic neuropathy, unspecified: Secondary | ICD-10-CM

## 2021-03-27 ENCOUNTER — Emergency Department (HOSPITAL_COMMUNITY)
Admission: EM | Admit: 2021-03-27 | Discharge: 2021-03-27 | Disposition: A | Discharge status: Home / Self Care | Payer: Medicare Other | Attending: Emergency Medicine | Admitting: Emergency Medicine

## 2021-03-27 ENCOUNTER — Emergency Department (HOSPITAL_COMMUNITY): Payer: Medicare Other

## 2021-03-27 ENCOUNTER — Encounter (HOSPITAL_COMMUNITY): Payer: Self-pay

## 2021-03-27 ENCOUNTER — Other Ambulatory Visit: Payer: Self-pay

## 2021-03-27 DIAGNOSIS — N184 Chronic kidney disease, stage 4 (severe): Secondary | ICD-10-CM | POA: Insufficient documentation

## 2021-03-27 DIAGNOSIS — Z79899 Other long term (current) drug therapy: Secondary | ICD-10-CM | POA: Diagnosis not present

## 2021-03-27 DIAGNOSIS — Z794 Long term (current) use of insulin: Secondary | ICD-10-CM | POA: Diagnosis not present

## 2021-03-27 DIAGNOSIS — R600 Localized edema: Secondary | ICD-10-CM | POA: Diagnosis not present

## 2021-03-27 DIAGNOSIS — E1140 Type 2 diabetes mellitus with diabetic neuropathy, unspecified: Secondary | ICD-10-CM | POA: Diagnosis not present

## 2021-03-27 DIAGNOSIS — Z7982 Long term (current) use of aspirin: Secondary | ICD-10-CM | POA: Diagnosis not present

## 2021-03-27 DIAGNOSIS — I158 Other secondary hypertension: Secondary | ICD-10-CM | POA: Insufficient documentation

## 2021-03-27 DIAGNOSIS — Z87891 Personal history of nicotine dependence: Secondary | ICD-10-CM | POA: Insufficient documentation

## 2021-03-27 DIAGNOSIS — Z8616 Personal history of COVID-19: Secondary | ICD-10-CM | POA: Diagnosis not present

## 2021-03-27 DIAGNOSIS — R0602 Shortness of breath: Secondary | ICD-10-CM

## 2021-03-27 DIAGNOSIS — R0609 Other forms of dyspnea: Secondary | ICD-10-CM | POA: Diagnosis present

## 2021-03-27 LAB — CBC WITH DIFFERENTIAL/PLATELET
Abs Immature Granulocytes: 0.02 10*3/uL (ref 0.00–0.07)
Basophils Absolute: 0 10*3/uL (ref 0.0–0.1)
Basophils Relative: 1 %
Eosinophils Absolute: 0.2 10*3/uL (ref 0.0–0.5)
Eosinophils Relative: 3 %
HCT: 32.6 % — ABNORMAL LOW (ref 36.0–46.0)
Hemoglobin: 9.6 g/dL — ABNORMAL LOW (ref 12.0–15.0)
Immature Granulocytes: 0 %
Lymphocytes Relative: 25 %
Lymphs Abs: 1.4 10*3/uL (ref 0.7–4.0)
MCH: 26.1 pg (ref 26.0–34.0)
MCHC: 29.4 g/dL — ABNORMAL LOW (ref 30.0–36.0)
MCV: 88.6 fL (ref 80.0–100.0)
Monocytes Absolute: 0.5 10*3/uL (ref 0.1–1.0)
Monocytes Relative: 9 %
Neutro Abs: 3.5 10*3/uL (ref 1.7–7.7)
Neutrophils Relative %: 62 %
Platelets: 196 10*3/uL (ref 150–400)
RBC: 3.68 MIL/uL — ABNORMAL LOW (ref 3.87–5.11)
RDW: 16.3 % — ABNORMAL HIGH (ref 11.5–15.5)
WBC: 5.6 10*3/uL (ref 4.0–10.5)
nRBC: 0 % (ref 0.0–0.2)

## 2021-03-27 LAB — BASIC METABOLIC PANEL
Anion gap: 6 (ref 5–15)
BUN: 35 mg/dL — ABNORMAL HIGH (ref 8–23)
CO2: 27 mmol/L (ref 22–32)
Calcium: 8.8 mg/dL — ABNORMAL LOW (ref 8.9–10.3)
Chloride: 109 mmol/L (ref 98–111)
Creatinine, Ser: 2.85 mg/dL — ABNORMAL HIGH (ref 0.44–1.00)
GFR, Estimated: 17 mL/min — ABNORMAL LOW (ref >60)
Glucose, Bld: 100 mg/dL — ABNORMAL HIGH (ref 70–99)
Potassium: 3.9 mmol/L (ref 3.5–5.1)
Sodium: 142 mmol/L (ref 135–145)

## 2021-03-27 LAB — TROPONIN I (HIGH SENSITIVITY): Troponin I (High Sensitivity): 14 ng/L (ref <18)

## 2021-03-27 LAB — BRAIN NATRIURETIC PEPTIDE: B Natriuretic Peptide: 84.3 pg/mL (ref 0.0–100.0)

## 2021-03-27 MED ORDER — NITROGLYCERIN 2 % TD OINT: 1.0000 [in_us] | TOPICAL_OINTMENT | Freq: Once | TRANSDERMAL | Status: DC

## 2021-03-27 MED ORDER — CARVEDILOL 12.5 MG PO TABS
25.0000 mg | ORAL_TABLET | Freq: Once | ORAL | Status: AC
Start: 2021-03-27 — End: 2021-03-27
  Administered 2021-03-27: 25 mg via ORAL
  Filled 2021-03-27: qty 2

## 2021-03-27 MED ORDER — LOSARTAN POTASSIUM 25 MG PO TABS
100.0000 mg | ORAL_TABLET | Freq: Once | ORAL | Status: AC
  Administered 2021-03-27: 100 mg via ORAL
  Filled 2021-03-27: qty 4

## 2021-03-27 NOTE — ED Notes (Signed)
Report received from Jones Broom, RN.

## 2021-03-27 NOTE — ED Notes (Signed)
EDP at the bedside.  ?

## 2021-03-27 NOTE — ED Notes (Signed)
Pt attached to cardiac monitor x3. A&O x4. Pt hypertensive, however, VS are otherwise stable.

## 2021-03-27 NOTE — ED Triage Notes (Addendum)
Pt arrives EMS from home after waking up with a ringing in the ears. Pt t hen checked bp where it was 226/98. Pt admits to taking hydralazine 25mg  once a day but is supposed to take BID.   Took an additional pill just prior to EMS arrival.

## 2021-03-27 NOTE — Discharge Instructions (Signed)
Please take your home medications as prescribed. Follow up with your PCP in the next 3-5 days for continued blood pressure management and recheck of your kidney function.

## 2021-03-27 NOTE — ED Provider Notes (Signed)
[facility] DEPT Provider Note  CSN: [id number] Arrival date & time: 03/27/21 0315  Chief Complaint(s) Hypertension  HPI Michelle Abbott is a 75 y.o. female here for elevated blood pressures noted at home with systolics of 355 over 73U.  Patient began feeling ringing/pulsating sounds in her ear prompting her to check her blood pressure.  Reports that she has been taking her home blood pressure medications but only takes them once a day.  She does admit that there are some medications that are twice daily.  Patient also endorsing approximately 1 week of worsening bilateral lower extremity edema that does improve with elevation and worsens with being in a dependent position.  She also endorses dyspnea on exertion for the past week.  No overt chest pain.  No recent fevers or infections.  No coughing or congestion.  She denies any illicit drug use.  No Afrin use.  No over-the-counter nasal congestion medicine.  She reports that she was given hydralazine by her nephrologist to take twice a day.  She took one of the tablets prior to arrival.  The history is provided by the patient.   Past Medical History Past Medical History:  Diagnosis Date   Anemia    Arthritis    Chronic kidney disease    Stage 4   Diabetes mellitus without complication ([place])    type 2   Gout 2017   Hyperlipidemia    Hypertension    Postoperative pain 04/29/2020   Sciatica    SI (sacroiliac) joint inflammation ([place]) 03/12/2019   UTI (urinary tract infection) 02/02/2019   Vitamin D deficiency 2014   Patient Active Problem List   Diagnosis Date Noted   COVID-19 virus infection 11/01/2020   Leg swelling 09/24/2020   Incontinence 09/24/2020   Cloudy urine 09/24/2020   Dehydration 04/29/2020   Postoperative pain 04/29/2020   Hemoglobin decreased of chronic normocytic anemia 04/29/2020   Acute kidney injury superimposed on chronic kidney disease ([place]) 04/28/2020   Spondylolisthesis at  L4-L5 level 04/26/2020   Rectal bleeding, History of 12/28/2019   Age-related hearing loss 03/12/2019   Sciatica 11/17/2016   Echocardiogram shows left ventricular diastolic dysfunction 20/25/4270   CKD (chronic kidney disease), stage IV ([place]) 07/21/2016   Type 2 diabetes mellitus with diabetic neuropathy, with long-term current use of insulin ([place]) 06/15/2016   Gout 06/15/2016   Essential hypertension 06/15/2016   Chronic anemia 06/15/2016   Vitamin D deficiency 06/15/2016   Osteoarthritis 06/15/2016   Hyperlipemia 06/15/2016   Back pain 06/15/2016   Home Medication(s) Prior to Admission medications   Medication Sig Start Date End Date Taking? Authorizing Provider  acetaminophen (TYLENOL 8 HOUR) 650 MG CR tablet Take 1 tablet (650 mg total) by mouth every 8 (eight) hours as needed for pain. 05/02/20  Yes Sharion Settler, DO  allopurinol (ZYLOPRIM) 100 MG tablet TAKE 1 TABLET BY MOUTH  DAILY Patient taking differently: Take 100 mg by mouth daily. 04/12/20  Yes Gladys Damme, MD  aspirin EC 81 MG tablet Take 81 mg by mouth daily. Swallow whole.   Yes [provider]  carvedilol (COREG) 25 MG tablet Take 25 mg by mouth 2 (two) times daily with a meal.   Yes [provider]  cholecalciferol (VITAMIN D) 1000 units tablet Take 1,000 Units by mouth daily.   Yes [provider]  colchicine 0.6 MG tablet Take 2 tablets (1.2 mg total) at onset of gout flare then take 1 tablet (0.6 mg) one hour later if  symptoms persist. (MAX: 1.8 mg total daily) Patient taking differently: Take 0.6-1.2 mg by mouth See admin instructions. Take 2 tablets (1.2 mg total) at onset of gout flare then take 1 tablet (0.6 mg) one hour later if symptoms persist. (MAX: 1.8 mg total daily) 04/12/19  Yes Martyn Malay, MD  ferrous sulfate 325 (65 FE) MG tablet Take 325 mg by mouth daily. 06/12/16  Yes [provider]  hydrALAZINE (APRESOLINE) 25 MG tablet Take 25 mg by mouth 2 (two)  times daily. 11/13/20  Yes [provider]  LANTUS SOLOSTAR 100 UNIT/ML Solostar Pen Inject 40 Units into the skin daily. Increase as instructed. Max dose 50 units. Patient taking differently: Inject 40 Units into the skin daily at 6 (six) AM. 09/11/19  Yes Gifford Shave, MD  losartan (COZAAR) 100 MG tablet TAKE 1 TABLET BY MOUTH  DAILY Patient taking differently: Take 100 mg by mouth daily. 11/19/20  Yes Gladys Damme, MD  Multiple Vitamin (MULTIVITAMIN WITH MINERALS) TABS tablet Take 1 tablet by mouth daily.   Yes [provider]  oxybutynin (DITROPAN-XL) 5 MG 24 hr tablet Take 5 mg by mouth daily. 01/24/21  Yes [provider]  OZEMPIC, 0.25 OR 0.5 MG/DOSE, 2 MG/1.5ML SOPN INJECT 0.5 MG INTO THE SKIN WEEKLY Patient taking differently: Inject 0.5 mg into the muscle every Wednesday. 04/03/20  Yes Gladys Damme, MD  polyethylene glycol powder (GLYCOLAX/MIRALAX) powder Take 17 g by mouth 2 (two) times daily as needed. Patient taking differently: Take 17 g by mouth 2 (two) times daily as needed for moderate constipation. 12/28/16  Yes Riccio, Angela C, DO  rosuvastatin (CRESTOR) 40 MG tablet TAKE 1 TABLET BY MOUTH  DAILY Patient taking differently: Take 40 mg by mouth daily. 11/19/20  Yes Gladys Damme, MD  vitamin B-12 (CYANOCOBALAMIN) 1000 MCG tablet Take 1,000 mcg by mouth daily.    Yes [provider]  ACCU-CHEK FASTCLIX LANCETS MISC TEST BLOOD SUGAR FOUR TIMES DAILY 08/24/17   Lucila Maine C, DO  B-D ULTRAFINE III SHORT PEN 31G X 8 MM MISC Use to give insulin twice a day and victoza once a day. Dx code = E11.9 03/22/19   Gladys Damme, MD  Blood Glucose Monitoring Suppl [facility] VERIO) w/Device KIT Use to check blood sugars 3x daily. E11.9 05/21/20   Gladys Damme, MD  glucose blood (ONETOUCH ULTRA) test strip Use to test blood sugars 3x daily. E11.9 05/21/20   Gladys Damme, MD                                                                                                                                     Past Surgical History Past Surgical History:  Procedure Laterality Date   BREAST SURGERY Right 1961   Benign lump removed   CATARACT EXTRACTION, BILATERAL Bilateral 2017   [place]  COLONOSCOPY     DILATION AND CURETTAGE OF UTERUS  2017   KNEE ARTHROSCOPY Right    VAGINAL SEPTOPLASTY  1966   Family History Family History  Problem Relation Age of Onset   Cancer Mother        ovarian and pancreatic   Diabetes Mother    Stroke Mother    COPD Father    Arthritis Father    Cancer Brother        Lung   COPD Brother     Social History Social History   Tobacco Use   Smoking status: Former    Packs/day: 1.50    Years: 30.00    Pack years: 45.00    Types: Cigarettes    Quit date: 05/18/1997    Years since quitting: 23.8   Smokeless tobacco: Never   Tobacco comments:    1.5 - 2 ppd for 30 years:   no plans to re-start  Vaping Use   Vaping Use: Never used  Substance Use Topics   Alcohol use: No   Drug use: No   Allergies Ciprofloxacin hcl, Metformin and related, Ace inhibitors, Amlodipine, Baclofen, Ciprofloxacin, Feraheme [ferumoxytol], Metformin, and Lisinopril  Review of Systems Review of Systems All other systems are reviewed and are negative for acute change except as noted in the HPI  Physical Exam Vital Signs  I have reviewed the triage vital signs BP (!) 185/87  Pulse 84   Temp 98.1 F (36.7 C) (Oral)   Resp 19   Ht 5' 3" (1.6 m)   Wt 110.7 kg   SpO2 100%   BMI 43.22 kg/m   Physical Exam Vitals reviewed.  Constitutional:      General: She is not in acute distress.    Appearance: She is well-developed. She is obese. She is not diaphoretic.  HENT:     Head: Normocephalic and atraumatic.     Nose: Nose normal.  Eyes:     General: No scleral icterus.       Right eye: No discharge.        Left eye: No discharge.      Conjunctiva/sclera: Conjunctivae normal.     Pupils: Pupils are equal, round, and reactive to light.  Cardiovascular:     Rate and Rhythm: Normal rate and regular rhythm.     Heart sounds: No murmur heard.   No friction rub. No gallop.  Pulmonary:     Effort: Pulmonary effort is normal. No respiratory distress.     Breath sounds: Normal breath sounds. No stridor. No rales.  Abdominal:     General: There is no distension.     Palpations: Abdomen is soft.     Tenderness: There is no abdominal tenderness.  Musculoskeletal:        General: No tenderness.     Cervical back: Normal range of motion and neck supple.     Right lower leg: 1+ Pitting Edema present.     Left lower leg: 1+ Pitting Edema present.  Skin:    General: Skin is warm and dry.     Findings: No erythema or rash.  Neurological:     Mental Status: She is alert and oriented to person, place, and time.    ED Results and Treatments Labs (all labs ordered are listed, but only abnormal results are displayed) Labs Reviewed  CBC WITH DIFFERENTIAL/PLATELET - Abnormal; Notable for the following components:      Result Value   RBC 3.68 (*)  Hemoglobin 9.6 (*)    HCT 32.6 (*)    MCHC 29.4 (*)    RDW 16.3 (*)    All other components within normal limits  BASIC METABOLIC PANEL - Abnormal; Notable for the following components:   Glucose, Bld 100 (*)    BUN 35 (*)    Creatinine, Ser 2.85 (*)    Calcium 8.8 (*)    GFR, Estimated 17 (*)    All other components within normal limits  BRAIN NATRIURETIC PEPTIDE  TROPONIN I (HIGH SENSITIVITY)  TROPONIN I (HIGH SENSITIVITY)                                                                                                                         EKG  EKG Interpretation  Date/Time:  Thursday March 27 2021 06:07:57 EST Ventricular Rate:  77 PR Interval:  175 QRS Duration: 86 QT Interval:  392 QTC Calculation: 444 R Axis:   -18 Text Interpretation: Sinus arrhythmia  Borderline left axis deviation Borderline T abnormalities, lateral leads Confirmed by Addison Lank [id number]) on 03/27/2021 6:13:15 AM       Radiology DG Chest Port 1 View  Result Date: 03/27/2021 CLINICAL DATA:  Shortness of breath. EXAM: PORTABLE CHEST 1 VIEW COMPARISON:  04/28/2020 FINDINGS: The cardiac silhouette, mediastinal and hilar contours are within normal limits and stable. Streaky basilar scarring changes but no infiltrates, edema or effusions. IMPRESSION: No acute cardiopulmonary findings. Electronically Signed   By: Marijo Sanes M.D.   On: 03/27/2021 06:18    Pertinent labs & imaging results that were available during my care of the patient were reviewed by me and considered in my medical decision making (see MDM for details).  Medications Ordered in ED Medications  losartan (COZAAR) tablet 100 mg (100 mg Oral Given 03/27/21 0618)  carvedilol (COREG) tablet 25 mg (25 mg Oral Given 03/27/21 0618)                                                                                                                                      Procedures Procedures  (including critical care time)  Medical Decision Making / ED Course I have reviewed the nursing notes for this encounter and the patient's prior records (if available in EHR or on provided paperwork).  Keyle Doby was evaluated in Emergency Department on 03/27/2021 for the symptoms described in the history of present illness. She  was evaluated in the context of the global COVID-19 pandemic, which necessitated consideration that the patient might be at risk for infection with the SARS-CoV-2 virus that causes COVID-19. Institutional protocols and algorithms that pertain to the evaluation of patients at risk for COVID-19 are in a state of rapid change based on information released by regulatory bodies including the CDC and federal and state organizations. These policies and algorithms were followed during the patient's care in  the ED.     Patient is hypertensive with systolics in the 163W over 90s. No evidence of volume overload on exam. History of CKD.  No history of CHF.  Last echo ordered for February of this year was not obtained.  No previous echo.  During my initial evaluation patient's blood pressure began to trend down to systolics of 466Z.  This is likely secondary to her taking her home medication prior to arrival. Will give the rest of her home meds.  Given the patient's past medical history, will obtain screening labs to assess renal function.  We will also attempt assess for possible heart failure.  Pertinent labs & imaging results that were available during my care of the patient were reviewed by me and considered in my medical decision making:  Chest x-ray without evidence of cardiomegaly or pulmonary edema.  This does not favor heart failure. EKG notable for new borderline T wave flattening in the lateral precordial leads. BNP and troponin negative. Mildly worsened renal dysfunction from baseline.   Final Clinical Impression(s) / ED Diagnoses Final diagnoses:  SOBOE (shortness of breath on exertion)  Other secondary hypertension   The patient appears reasonably screened and/or stabilized for discharge and I doubt any other medical condition or other [facility] requiring further screening, evaluation, or treatment in the ED at this time prior to discharge. Safe for discharge with strict return precautions.  Disposition: Discharge  Condition: Good  I have discussed the results, Dx and Tx plan with the patient/family who expressed understanding and agree(s) with the plan. Discharge instructions discussed at length. The patient/family was given strict return precautions who verbalized understanding of the instructions. No further questions at time of discharge.    ED Discharge Orders     None       Follow Up: Primary care provider  Call  to schedule an appointment for close follow  up     This chart was dictated using voice recognition software.  Despite best efforts to proofread,  errors can occur which can change the documentation meaning.    Fatima Blank, MD 03/27/21 [phone]

## 2021-04-01 ENCOUNTER — Other Ambulatory Visit: Payer: Self-pay | Admitting: Family Medicine

## 2021-04-01 DIAGNOSIS — M1A39X0 Chronic gout due to renal impairment, multiple sites, without tophus (tophi): Secondary | ICD-10-CM

## 2021-04-09 ENCOUNTER — Ambulatory Visit (INDEPENDENT_AMBULATORY_CARE_PROVIDER_SITE_OTHER): Payer: Medicare Other | Admitting: Family Medicine

## 2021-04-09 ENCOUNTER — Other Ambulatory Visit: Payer: Self-pay

## 2021-04-09 VITALS — BP 180/67 | HR 76 | Wt 247.0 lb

## 2021-04-09 DIAGNOSIS — R1030 Lower abdominal pain, unspecified: Secondary | ICD-10-CM | POA: Diagnosis not present

## 2021-04-09 DIAGNOSIS — I10 Essential (primary) hypertension: Secondary | ICD-10-CM | POA: Diagnosis not present

## 2021-04-09 DIAGNOSIS — E1140 Type 2 diabetes mellitus with diabetic neuropathy, unspecified: Secondary | ICD-10-CM

## 2021-04-09 DIAGNOSIS — M7989 Other specified soft tissue disorders: Secondary | ICD-10-CM | POA: Diagnosis not present

## 2021-04-09 DIAGNOSIS — Z794 Long term (current) use of insulin: Secondary | ICD-10-CM

## 2021-04-09 LAB — POCT URINALYSIS DIP (MANUAL ENTRY)
Bilirubin, UA: NEGATIVE
Glucose, UA: NEGATIVE mg/dL
Ketones, POC UA: NEGATIVE mg/dL
Leukocytes, UA: NEGATIVE
Nitrite, UA: NEGATIVE
Protein Ur, POC: >=300 mg/dL — AB
Spec Grav, UA: 1.02 (ref 1.010–1.025)
Urobilinogen, UA: 0.2 E.U./dL
pH, UA: 6 (ref 5.0–8.0)

## 2021-04-09 LAB — POCT UA - MICROSCOPIC ONLY

## 2021-04-09 MED ORDER — OZEMPIC (0.25 OR 0.5 MG/DOSE) 2 MG/1.5ML ~~LOC~~ SOPN: PEN_INJECTOR | SUBCUTANEOUS | 3 refills | Status: DC

## 2021-04-09 MED ORDER — HYDRALAZINE HCL 50 MG PO TABS: 50.0000 mg | ORAL_TABLET | Freq: Two times a day (BID) | ORAL | 0 refills | Status: DC

## 2021-04-09 MED ORDER — LANTUS SOLOSTAR 100 UNIT/ML ~~LOC~~ SOPN: 30.0000 [IU] | PEN_INJECTOR | Freq: Every day | SUBCUTANEOUS | 0 refills | Status: DC

## 2021-04-09 NOTE — Assessment & Plan Note (Signed)
Less likely cardiac given normal BNP recently.  Advised to elevate legs and use compression stockings as able.

## 2021-04-09 NOTE — Assessment & Plan Note (Signed)
Having more hypoglycemia, decrease Lantus to 30 units.  Continue Ozempic.  Follow-up with PCP in 1 month.

## 2021-04-09 NOTE — Assessment & Plan Note (Signed)
Uncontrolled.  Stressed importance of taking evening dose of medications.  She did take her medications this morning so we will still need further increase.  Increase hydralazine to 50 mg twice daily.  Continue carvedilol and losartan (already maxed out).

## 2021-04-09 NOTE — Progress Notes (Signed)
    SUBJECTIVE:   CHIEF COMPLAINT / HPI: ED follow-up  Recently seen in the ED 11/10 for HTN and DOE, home readings 220/90s and had tinnitus. Was not taking her medications correctly (only take meds once daily, some are twice daily meds). Workup with CXR, BNP, troponin, labs overall unremarkable other than slight increase in creatinine. New borderline T wave flattening seen on EKG. Given home antihypertensives with improvement in BP. Medications include carvedilol 25 mg twice daily, hydralazine 25 mg twice daily, losartan 100 mg daily. Home readings: 180s/80s. Has been forgetting to take the evening dose of medication most nights.  Did take her medications this morning. She reports continued leg swelling which is worse throughout the day.  T2DM Needs refills of her Lantus and Ozempic Taking Lantus 40 units daily Reporting hypoglycemia 3-4 times in the past month, fasting CBG readings typically 80-100.  Transportation has been a barrier for her.  PERTINENT  PMH / PSH: HTN, CKD IV, T2DM  OBJECTIVE:   BP (!) 180/67   Pulse 76   Wt 247 lb (112 kg)   BMI 43.75 kg/m   General: Alert, NAD CV: RRR, no murmurs Pulm: CTAB, no wheezes or rales Extremities: 1+ pitting edema bilaterally  ASSESSMENT/PLAN:   Type 2 diabetes mellitus with diabetic neuropathy, with long-term current use of insulin (HCC) Having more hypoglycemia, decrease Lantus to 30 units.  Continue Ozempic.  Follow-up with PCP in 1 month.  Essential hypertension Uncontrolled.  Stressed importance of taking evening dose of medications.  She did take her medications this morning so we will still need further increase.  Increase hydralazine to 50 mg twice daily.  Continue carvedilol and losartan (already maxed out).  Leg swelling Less likely cardiac given normal BNP recently.  Advised to elevate legs and use compression stockings as able.     Zola Button, MD [place]

## 2021-04-09 NOTE — Patient Instructions (Addendum)
It was nice seeing you today!  Increase hydralazine to 50 mg twice a day.  Decrease Lantus to 30 units a day.  Call if you are having a lot of low blood sugars or high blood sugars.  Follow-up in 1 month if you are able to.  Please arrive at least 15 minutes prior to your scheduled appointments.  Stay well, Zola Button, MD [place] [phone]

## 2021-04-30 LAB — VITAMIN D 25 HYDROXY (VIT D DEFICIENCY, FRACTURES): Vit D, 25-Hydroxy: 29

## 2021-04-30 LAB — BASIC METABOLIC PANEL
BUN: 35 — AB (ref 4–21)
CO2: 28 — AB (ref 13–22)
Chloride: 109 — AB (ref 99–108)
Creatinine: 3.3 — AB (ref 0.5–1.1)
Potassium: 4.6 (ref 3.4–5.3)
Sodium: 143 (ref 137–147)

## 2021-04-30 LAB — CBC AND DIFFERENTIAL
Hemoglobin: 10.2 — AB (ref 12.0–16.0)
Platelets: 255 (ref 150–399)
WBC: 4.9

## 2021-04-30 LAB — COMPREHENSIVE METABOLIC PANEL
Albumin: 3.5 (ref 3.5–5.0)
Calcium: 9.4 (ref 8.7–10.7)

## 2021-05-09 ENCOUNTER — Encounter: Payer: Self-pay | Admitting: Family Medicine

## 2021-05-09 DIAGNOSIS — I129 Hypertensive chronic kidney disease with stage 1 through stage 4 chronic kidney disease, or unspecified chronic kidney disease: Secondary | ICD-10-CM

## 2021-05-09 DIAGNOSIS — R809 Proteinuria, unspecified: Secondary | ICD-10-CM

## 2021-05-09 DIAGNOSIS — N2581 Secondary hyperparathyroidism of renal origin: Secondary | ICD-10-CM

## 2021-05-09 HISTORY — DX: Hypertensive chronic kidney disease with stage 1 through stage 4 chronic kidney disease, or unspecified chronic kidney disease: I12.9

## 2021-05-09 HISTORY — DX: Proteinuria, unspecified: R80.9

## 2021-05-09 HISTORY — DX: Secondary hyperparathyroidism of renal origin: N25.81

## 2021-05-09 LAB — PROTEIN / CREATININE RATIO, URINE: Protein/Creatinine Ratio: 7968

## 2021-05-09 NOTE — Progress Notes (Unsigned)
Summary Office Visit with Graylon Gunning MD (Nephrology) 04/30/21 Serum creatinine 3.26 potassium 4.6 Alb 3.5 Bicarbonate 28 Hgb 10.2 iPTH 78 Vit D (25) 29 Urine Protein/Creatinine ratio = 7968 mg/g creatinine)  Plan Restart furosemide 40 by mouth daily  Stop hydralazine because daytime drowsiness after increasing hydralazine Start Nifedipine ER 60 mg daily

## 2021-05-20 DIAGNOSIS — R69 Illness, unspecified: Secondary | ICD-10-CM | POA: Diagnosis not present

## 2021-05-20 DIAGNOSIS — H43823 Vitreomacular adhesion, bilateral: Secondary | ICD-10-CM | POA: Diagnosis not present

## 2021-05-20 DIAGNOSIS — H35033 Hypertensive retinopathy, bilateral: Secondary | ICD-10-CM | POA: Diagnosis not present

## 2021-05-20 DIAGNOSIS — E113313 Type 2 diabetes mellitus with moderate nonproliferative diabetic retinopathy with macular edema, bilateral: Secondary | ICD-10-CM | POA: Diagnosis not present

## 2021-05-20 DIAGNOSIS — H43813 Vitreous degeneration, bilateral: Secondary | ICD-10-CM | POA: Diagnosis not present

## 2021-05-22 DIAGNOSIS — R262 Difficulty in walking, not elsewhere classified: Secondary | ICD-10-CM | POA: Diagnosis not present

## 2021-05-22 DIAGNOSIS — R531 Weakness: Secondary | ICD-10-CM | POA: Diagnosis not present

## 2021-05-22 DIAGNOSIS — R69 Illness, unspecified: Secondary | ICD-10-CM | POA: Diagnosis not present

## 2021-05-22 DIAGNOSIS — M545 Low back pain, unspecified: Secondary | ICD-10-CM | POA: Diagnosis not present

## 2021-05-29 DIAGNOSIS — R531 Weakness: Secondary | ICD-10-CM | POA: Diagnosis not present

## 2021-05-29 DIAGNOSIS — M545 Low back pain, unspecified: Secondary | ICD-10-CM | POA: Diagnosis not present

## 2021-05-29 DIAGNOSIS — R262 Difficulty in walking, not elsewhere classified: Secondary | ICD-10-CM | POA: Diagnosis not present

## 2021-05-29 DIAGNOSIS — R69 Illness, unspecified: Secondary | ICD-10-CM | POA: Diagnosis not present

## 2021-06-03 DIAGNOSIS — R69 Illness, unspecified: Secondary | ICD-10-CM | POA: Diagnosis not present

## 2021-06-03 DIAGNOSIS — R531 Weakness: Secondary | ICD-10-CM | POA: Diagnosis not present

## 2021-06-03 DIAGNOSIS — R262 Difficulty in walking, not elsewhere classified: Secondary | ICD-10-CM | POA: Diagnosis not present

## 2021-06-03 DIAGNOSIS — M545 Low back pain, unspecified: Secondary | ICD-10-CM | POA: Diagnosis not present

## 2021-06-05 DIAGNOSIS — R262 Difficulty in walking, not elsewhere classified: Secondary | ICD-10-CM | POA: Diagnosis not present

## 2021-06-05 DIAGNOSIS — M545 Low back pain, unspecified: Secondary | ICD-10-CM | POA: Diagnosis not present

## 2021-06-05 DIAGNOSIS — R531 Weakness: Secondary | ICD-10-CM | POA: Diagnosis not present

## 2021-06-05 DIAGNOSIS — R69 Illness, unspecified: Secondary | ICD-10-CM | POA: Diagnosis not present

## 2021-06-10 DIAGNOSIS — M545 Low back pain, unspecified: Secondary | ICD-10-CM | POA: Diagnosis not present

## 2021-06-10 DIAGNOSIS — R69 Illness, unspecified: Secondary | ICD-10-CM | POA: Diagnosis not present

## 2021-06-10 DIAGNOSIS — R262 Difficulty in walking, not elsewhere classified: Secondary | ICD-10-CM | POA: Diagnosis not present

## 2021-06-10 DIAGNOSIS — R531 Weakness: Secondary | ICD-10-CM | POA: Diagnosis not present

## 2021-06-12 DIAGNOSIS — R262 Difficulty in walking, not elsewhere classified: Secondary | ICD-10-CM | POA: Diagnosis not present

## 2021-06-12 DIAGNOSIS — R69 Illness, unspecified: Secondary | ICD-10-CM | POA: Diagnosis not present

## 2021-06-12 DIAGNOSIS — M545 Low back pain, unspecified: Secondary | ICD-10-CM | POA: Diagnosis not present

## 2021-06-12 DIAGNOSIS — R531 Weakness: Secondary | ICD-10-CM | POA: Diagnosis not present

## 2021-06-16 DIAGNOSIS — N189 Chronic kidney disease, unspecified: Secondary | ICD-10-CM | POA: Diagnosis not present

## 2021-06-16 DIAGNOSIS — N184 Chronic kidney disease, stage 4 (severe): Secondary | ICD-10-CM | POA: Diagnosis not present

## 2021-06-16 DIAGNOSIS — I129 Hypertensive chronic kidney disease with stage 1 through stage 4 chronic kidney disease, or unspecified chronic kidney disease: Secondary | ICD-10-CM | POA: Diagnosis not present

## 2021-06-16 DIAGNOSIS — D631 Anemia in chronic kidney disease: Secondary | ICD-10-CM | POA: Diagnosis not present

## 2021-06-16 DIAGNOSIS — N2581 Secondary hyperparathyroidism of renal origin: Secondary | ICD-10-CM | POA: Diagnosis not present

## 2021-06-16 DIAGNOSIS — N390 Urinary tract infection, site not specified: Secondary | ICD-10-CM | POA: Diagnosis not present

## 2021-06-16 DIAGNOSIS — R69 Illness, unspecified: Secondary | ICD-10-CM | POA: Diagnosis not present

## 2021-06-17 DIAGNOSIS — R531 Weakness: Secondary | ICD-10-CM | POA: Diagnosis not present

## 2021-06-17 DIAGNOSIS — M545 Low back pain, unspecified: Secondary | ICD-10-CM | POA: Diagnosis not present

## 2021-06-17 DIAGNOSIS — R69 Illness, unspecified: Secondary | ICD-10-CM | POA: Diagnosis not present

## 2021-06-17 DIAGNOSIS — R262 Difficulty in walking, not elsewhere classified: Secondary | ICD-10-CM | POA: Diagnosis not present

## 2021-06-19 NOTE — Progress Notes (Signed)
° ° °  SUBJECTIVE:   CHIEF COMPLAINT / HPI:   DM2: Will obtain A1c today, last one was 6.4% in 5/22. Current regimen includes lantus 30U, ozempic 0.5mg  qw. Patient reports that she has been having fasting BG in the 70s, no nausea, no syncope.  HTN: BP is not at goal: 160/70. Current meds are nifedipine 30 mg ER qd (could not tolerate hydralazine due to daytime drowsiness), coreg 25 mg BID, losartan 100 mg BID. Patient reports that she saw nephrology yesterday, labs obtained. Previously patient was on chlorthalidone 25 mg, but stopped in 2021.  Leg swelling: patient trialed lasix 40 mg qd without improvement. She reports that she has no swelling in the morning, but after being awake for a few hours she will have swelling. She reports that she cannot wear compression hose as she has difficulty getting them on especially with back pain.  UTI: dysuria for several days. keflex started yesterday after nephrology visit.  PERTINENT  PMH / PSH: HTN, HLD, DM2  OBJECTIVE:   BP (!) 160/70    Pulse 78    Ht 5\' 3"  (1.6 m)    Wt 236 lb 12.8 oz (107.4 kg)    SpO2 99%    BMI 41.95 kg/m   Nursing note and vitals reviewed GEN: age-appropriate, AAW, resting comfortably in chair, NAD, class III obesity HEENT: NCAT. PERRLA. Sclera without injection or icterus. MMM.  Cardiac: Regular rate and rhythm. Normal S1/S2. No murmurs, rubs, or gallops appreciated. 2+ radial pulses. Lungs: Clear bilaterally to ascultation. No increased WOB, no accessory muscle usage. No w/r/r. Neuro: AOx3  Ext: +1 edema b/l Psych: Pleasant and appropriate   ASSESSMENT/PLAN:   Type 2 diabetes mellitus with diabetic neuropathy, with long-term current use of insulin (HCC) Chronic, well controlled. Current Regimen: lantus 30U, ozempic 0.5 mg sq qw CBGs: 70s Last A1c: 5.5% today Denies polyuria, polydipsia, hypoglycemic sx such as nausea, shaking, syncope Last Eye Exam: within last year Statin: yes ACE/ARB: losartan  Plan to  decrease lantus to 20U, increase ozempic to 1 mg sq qw. Decrease lantus by 5U for if fasting BG <100.     Hypertension associated with diabetes ([place]) Hypertension: chronic, not controlled - Medications: nifedipine 30 mg ER qd, coreg 25 mg BID, losartan 100 mg BID. - Compliance: good - Checking BP at home: yes - Denies any SOB, CP, vision changes, LE edema, medication SEs, or symptoms of hypotension - Diet: SAD - Exercise: physical therapy exercises, walking  Patient previously on chlorthalidone 25 mg, perhaps d/c'd due to gout and colchicine use. She had visit with nephrology yesterday with labs. Will contact nephrology for results of labs and plan from yesterday, she will need further blood pressure control. Nifedipine decreased due to leg swelling.    Leg swelling Recommend support hose given history of dependent swelling and no improvement with lasix. Recommend knee high zippers stocking of 30 mmHg.  Obesity, Class III, BMI 40-49.9 (morbid obesity) (HCC) Increase ozempic with goal of weight loss for metabolic syndrome as well as orthopedic pain.     Gladys Damme, MD [place]

## 2021-06-20 ENCOUNTER — Ambulatory Visit (INDEPENDENT_AMBULATORY_CARE_PROVIDER_SITE_OTHER): Payer: Medicare Other | Admitting: Family Medicine

## 2021-06-20 ENCOUNTER — Encounter: Payer: Self-pay | Admitting: Family Medicine

## 2021-06-20 ENCOUNTER — Other Ambulatory Visit: Payer: Self-pay

## 2021-06-20 VITALS — BP 160/70 | HR 78 | Ht 63.0 in | Wt 236.8 lb

## 2021-06-20 DIAGNOSIS — I10 Essential (primary) hypertension: Secondary | ICD-10-CM | POA: Diagnosis not present

## 2021-06-20 DIAGNOSIS — I152 Hypertension secondary to endocrine disorders: Secondary | ICD-10-CM | POA: Diagnosis not present

## 2021-06-20 DIAGNOSIS — M7989 Other specified soft tissue disorders: Secondary | ICD-10-CM

## 2021-06-20 DIAGNOSIS — Z23 Encounter for immunization: Secondary | ICD-10-CM

## 2021-06-20 DIAGNOSIS — Z794 Long term (current) use of insulin: Secondary | ICD-10-CM

## 2021-06-20 DIAGNOSIS — E1159 Type 2 diabetes mellitus with other circulatory complications: Secondary | ICD-10-CM

## 2021-06-20 DIAGNOSIS — E1140 Type 2 diabetes mellitus with diabetic neuropathy, unspecified: Secondary | ICD-10-CM

## 2021-06-20 LAB — POCT GLYCOSYLATED HEMOGLOBIN (HGB A1C): HbA1c, POC (controlled diabetic range): 5.6 % (ref 0.0–7.0)

## 2021-06-20 MED ORDER — OZEMPIC (1 MG/DOSE) 4 MG/3ML ~~LOC~~ SOPN: 1.0000 mg | PEN_INJECTOR | SUBCUTANEOUS | 3 refills | Status: DC

## 2021-06-20 NOTE — Assessment & Plan Note (Signed)
Hypertension: chronic, not controlled - Medications: nifedipine 30 mg ER qd, coreg 25 mg BID, losartan 100 mg BID. - Compliance: good - Checking BP at home: yes - Denies any SOB, CP, vision changes, LE edema, medication SEs, or symptoms of hypotension - Diet: SAD - Exercise: physical therapy exercises, walking  Patient previously on chlorthalidone 25 mg, perhaps d/c'd due to gout and colchicine use. She had visit with nephrology yesterday with labs. Will contact nephrology for results of labs and plan from yesterday, she will need further blood pressure control. Nifedipine decreased due to leg swelling.

## 2021-06-20 NOTE — Assessment & Plan Note (Signed)
Recommend support hose given history of dependent swelling and no improvement with lasix. Recommend knee high zippers stocking of 30 mmHg.

## 2021-06-20 NOTE — Patient Instructions (Addendum)
It was a pleasure to see you today!  Increase ozempic to 1 mg weekly If your fasting blood glucose is less than 100, go down on the lantus by 5 units Stop aspirin I will speak with your nephrologist and put a plan together for your high blood pressure Try the zip up knee high stockings for leg swelling. Look for 30 mmhg pressure in the stockings to get the most results   Be Well,  Dr. Chauncey Reading

## 2021-06-20 NOTE — Assessment & Plan Note (Signed)
Increase ozempic with goal of weight loss for metabolic syndrome as well as orthopedic pain.

## 2021-06-20 NOTE — Assessment & Plan Note (Signed)
Chronic, well controlled. Current Regimen: lantus 30U, ozempic 0.5 mg sq qw CBGs: 70s Last A1c: 5.5% today Denies polyuria, polydipsia, hypoglycemic sx such as nausea, shaking, syncope Last Eye Exam: within last year Statin: yes ACE/ARB: losartan  Plan to decrease lantus to 20U, increase ozempic to 1 mg sq qw. Decrease lantus by 5U for if fasting BG <100.

## 2021-06-23 ENCOUNTER — Telehealth: Payer: Self-pay | Admitting: Family Medicine

## 2021-06-23 DIAGNOSIS — E1159 Type 2 diabetes mellitus with other circulatory complications: Secondary | ICD-10-CM

## 2021-06-23 NOTE — Telephone Encounter (Signed)
Discussed blood pressure management with patient's nephrologist, Dr Posey Pronto, who recommended spironolactone 25 mg. I discussed with patient, she agrees to trial spironolactone. Follow up for BP check and BMP made for 07/11/21 at 1:30 PM.  Gladys Damme, MD [place] [facility], PGY-3

## 2021-06-24 DIAGNOSIS — R531 Weakness: Secondary | ICD-10-CM | POA: Diagnosis not present

## 2021-06-24 DIAGNOSIS — R262 Difficulty in walking, not elsewhere classified: Secondary | ICD-10-CM | POA: Diagnosis not present

## 2021-06-24 DIAGNOSIS — M545 Low back pain, unspecified: Secondary | ICD-10-CM | POA: Diagnosis not present

## 2021-06-26 DIAGNOSIS — R531 Weakness: Secondary | ICD-10-CM | POA: Diagnosis not present

## 2021-06-26 DIAGNOSIS — R262 Difficulty in walking, not elsewhere classified: Secondary | ICD-10-CM | POA: Diagnosis not present

## 2021-06-26 DIAGNOSIS — M545 Low back pain, unspecified: Secondary | ICD-10-CM | POA: Diagnosis not present

## 2021-06-30 ENCOUNTER — Telehealth: Payer: Self-pay

## 2021-06-30 NOTE — Telephone Encounter (Signed)
Patient calls nurse line requesting Keflex for treatment of UTI. Patient reports that she recently finished antibiotic last week for UTI that was being treated by Nephrology. Unable to access these records. Patient reports that symptoms have returned and that she would like an rx for Keflex.   Advised patient to reach out to Nephrology for further management of concern as they were treating initial UTI.   Patient verbalizes understanding.   Talbot Grumbling, RN

## 2021-07-01 DIAGNOSIS — N390 Urinary tract infection, site not specified: Secondary | ICD-10-CM | POA: Diagnosis not present

## 2021-07-01 DIAGNOSIS — R531 Weakness: Secondary | ICD-10-CM | POA: Diagnosis not present

## 2021-07-01 DIAGNOSIS — R262 Difficulty in walking, not elsewhere classified: Secondary | ICD-10-CM | POA: Diagnosis not present

## 2021-07-01 DIAGNOSIS — M545 Low back pain, unspecified: Secondary | ICD-10-CM | POA: Diagnosis not present

## 2021-07-01 NOTE — Telephone Encounter (Signed)
Agree with recommendation, will need urine culture. If unable to get in with nephrology in a timely manner, recommend coming to our clinic soon and getting urine culture.  Gladys Damme, MD [place] [facility], PGY-3

## 2021-07-05 DIAGNOSIS — R300 Dysuria: Secondary | ICD-10-CM | POA: Diagnosis not present

## 2021-07-05 DIAGNOSIS — R350 Frequency of micturition: Secondary | ICD-10-CM | POA: Diagnosis not present

## 2021-07-06 ENCOUNTER — Encounter (HOSPITAL_COMMUNITY): Payer: Self-pay

## 2021-07-06 ENCOUNTER — Other Ambulatory Visit: Payer: Self-pay

## 2021-07-06 ENCOUNTER — Emergency Department (HOSPITAL_COMMUNITY)
Admission: EM | Admit: 2021-07-06 | Discharge: 2021-07-06 | Disposition: A | Discharge status: Home / Self Care | Payer: Medicare Other | Attending: Emergency Medicine | Admitting: Emergency Medicine

## 2021-07-06 DIAGNOSIS — Z79899 Other long term (current) drug therapy: Secondary | ICD-10-CM | POA: Insufficient documentation

## 2021-07-06 DIAGNOSIS — E1122 Type 2 diabetes mellitus with diabetic chronic kidney disease: Secondary | ICD-10-CM | POA: Insufficient documentation

## 2021-07-06 DIAGNOSIS — Z794 Long term (current) use of insulin: Secondary | ICD-10-CM | POA: Diagnosis not present

## 2021-07-06 DIAGNOSIS — N189 Chronic kidney disease, unspecified: Secondary | ICD-10-CM | POA: Insufficient documentation

## 2021-07-06 DIAGNOSIS — I129 Hypertensive chronic kidney disease with stage 1 through stage 4 chronic kidney disease, or unspecified chronic kidney disease: Secondary | ICD-10-CM | POA: Insufficient documentation

## 2021-07-06 DIAGNOSIS — N3001 Acute cystitis with hematuria: Secondary | ICD-10-CM | POA: Diagnosis not present

## 2021-07-06 DIAGNOSIS — R300 Dysuria: Secondary | ICD-10-CM | POA: Diagnosis present

## 2021-07-06 LAB — URINALYSIS, ROUTINE W REFLEX MICROSCOPIC
Bilirubin Urine: NEGATIVE
Glucose, UA: NEGATIVE mg/dL
Ketones, ur: NEGATIVE mg/dL
Nitrite: NEGATIVE
Protein, ur: >=300 mg/dL — AB
Specific Gravity, Urine: 1.01 (ref 1.005–1.030)
WBC, UA: >50 WBC/hpf — ABNORMAL HIGH (ref 0–5)
pH: 6 (ref 5.0–8.0)

## 2021-07-06 MED ORDER — CEFDINIR 300 MG PO CAPS: 300.0000 mg | ORAL_CAPSULE | Freq: Every day | ORAL | 0 refills | Status: AC

## 2021-07-06 NOTE — Discharge Instructions (Signed)
As we discussed, your urine did not show signs of infection today.  I have written you a prescription for an antibiotic for management of this.  This antibiotic should be better than the Levaquin that you were prescribed earlier today with less side effects.  Please do not take the Levaquin, only the antibiotic Cefdinir that I have prescribed for you.  Please take this as prescribed in its entirety.  Follow-up with either your urology or your primary doctor for continued evaluation and management of your frequent UTIs.  Return if development of any new or worsening symptoms.

## 2021-07-06 NOTE — ED Provider Notes (Signed)
[facility] DEPT Provider Note   CSN: [id number] Arrival date & time: 07/06/21  1157     History  Chief Complaint  Patient presents with   Cystitis    Michelle Abbott is a 76 y.o. female.  Patient with history of diabetes, hypertension, and CKD presents today with complaints of dysuria. She states that same has been ongoing since the end of January, was seen by nephrology at that time, diagnosed with a UTI and placed on Keflex.  She states that she took the entire dose as prescribed but did not have resolution of symptoms.  She called her nephrologist and was told to drink plenty of water and cranberry juice which she has also attempted without relief.  Presented to urgent care earlier today and was again diagnosed with a UTI.  Urgent care then placed her on Levaquin considering failure with cephalosporins and concern for Bactrim or Macrobid in the presence of CKD.  However, patient states that she received IV Cipro previously and had burning at the infusion site, and felt that this occurred due to an allergic reaction.  She was therefore concerned with starting Levaquin and did research on this medication and found many side effects and black box warning and was concerned with taking this medication and therefore presented here for further recommendations. Patient does state that she has complicated history of many UTIs since she had back surgery in 1007 with was complicated with subsequent recurrent UTIs and 'overactive bladder' for which she is followed by urology. No new concerns regarding this. She denies any fevers, chills, flank pain, or hematuria.  The history is provided by the patient. No language interpreter was used.      Home Medications Prior to Admission medications   Medication Sig Start Date End Date Taking? Authorizing Provider  ACCU-CHEK FASTCLIX LANCETS MISC TEST BLOOD SUGAR FOUR TIMES DAILY 08/24/17   Steve Rattler, DO  acetaminophen  (TYLENOL 8 HOUR) 650 MG CR tablet Take 1 tablet (650 mg total) by mouth every 8 (eight) hours as needed for pain. 05/02/20   Sharion Settler, DO  allopurinol (ZYLOPRIM) 100 MG tablet Take 1 tablet (100 mg total) by mouth daily. 04/02/21   Gladys Damme, MD  B-D ULTRAFINE III SHORT PEN 31G X 8 MM MISC Use to give insulin twice a day and victoza once a day. Dx code = E11.9 03/22/19   Gladys Damme, MD  Blood Glucose Monitoring Suppl [facility] VERIO) w/Device KIT Use to check blood sugars 3x daily. E11.9 05/21/20   Gladys Damme, MD  carvedilol (COREG) 25 MG tablet Take 25 mg by mouth 2 (two) times daily with a meal.    [provider]  cholecalciferol (VITAMIN D) 1000 units tablet Take 1,000 Units by mouth daily.    [provider]  colchicine 0.6 MG tablet Take 2 tablets (1.2 mg total) at onset of gout flare then take 1 tablet (0.6 mg) one hour later if symptoms persist. (MAX: 1.8 mg total daily) Patient taking differently: Take 0.6-1.2 mg by mouth See admin instructions. Take 2 tablets (1.2 mg total) at onset of gout flare then take 1 tablet (0.6 mg) one hour later if symptoms persist. (MAX: 1.8 mg total daily) 04/12/19   Martyn Malay, MD  ferrous sulfate 325 (65 FE) MG tablet Take 325 mg by mouth daily. 06/12/16   [provider]  glucose blood (ONETOUCH ULTRA) test strip Use to test blood sugars 3x daily. E11.9 05/21/20   Chauncey Reading,  MD  °hydrALAZINE (APRESOLINE) 50 MG tablet Take 1 tablet (50 mg total) by mouth 2 (two) times daily. 04/09/21   Sun, Richard, MD  °LANTUS SOLOSTAR 100 UNIT/ML Solostar Pen Inject 30 Units into the skin daily. Increase as instructed. Max dose 50 units. 04/09/21   Sun, Richard, MD  °losartan (COZAAR) 100 MG tablet TAKE 1 TABLET BY MOUTH  DAILY °Patient taking differently: Take 100 mg by mouth daily. 11/19/20   Mahoney, Caitlin, MD  °Multiple Vitamin (MULTIVITAMIN WITH MINERALS) TABS tablet Take 1 tablet by mouth daily.    [provider]  °oxybutynin (DITROPAN-XL) 5 MG 24 hr tablet Take 5 mg by mouth daily. 01/24/21   [provider]  °polyethylene glycol powder (GLYCOLAX/MIRALAX) powder Take 17 g by mouth 2 (two) times daily as needed. °Patient taking differently: Take 17 g by mouth 2 (two) times daily as needed for moderate constipation. 12/28/16   Riccio, Angela C, DO  °rosuvastatin (CRESTOR) 40 MG tablet TAKE 1 TABLET BY MOUTH  DAILY °Patient taking differently: Take 40 mg by mouth daily. 11/19/20   Mahoney, Caitlin, MD  °Semaglutide, 1 MG/DOSE, (OZEMPIC, 1 MG/DOSE,) 4 MG/3ML SOPN Inject 1 mg into the skin once a week. 06/20/21   Mahoney, Caitlin, MD  °vitamin B-12 (CYANOCOBALAMIN) 1000 MCG tablet Take 1,000 mcg by mouth daily.     [provider]  °   ° °Allergies    °Ciprofloxacin hcl, Metformin and related, Ace inhibitors, Amlodipine, Baclofen, Ciprofloxacin, Feraheme [ferumoxytol], Metformin, and Lisinopril   ° °Review of Systems   °Review of Systems  °Constitutional:  Negative for chills and fever.  °Gastrointestinal:  Negative for abdominal pain, diarrhea, nausea and vomiting.  °Genitourinary:  Positive for dysuria. Negative for decreased urine volume, difficulty urinating, flank pain, frequency, hematuria, pelvic pain and urgency.  °All other systems reviewed and are negative. ° °Physical Exam °Updated Vital Signs °BP (!) 193/88 (BP Location: Left Arm)    Pulse 76    Temp 97.9 °F (36.6 °C) (Oral)    Resp 18    Ht 5' 3" (1.6 m)    Wt 105.7 kg    SpO2 95%    BMI 41.27 kg/m²  °Physical Exam °Vitals and nursing note reviewed.  °Constitutional:   °   General: She is not in acute distress. °   Appearance: Normal appearance. She is obese. She is not ill-appearing, toxic-appearing or diaphoretic.  °   Comments: Patient resting comfortably in bed in no acute distress  °HENT:  °   Head: Normocephalic and atraumatic.  °Abdominal:  °   General: Abdomen is flat. There is no distension.  °   Palpations: Abdomen is soft. There  is no mass.  °   Tenderness: There is no abdominal tenderness. There is no right CVA tenderness, left CVA tenderness, guarding or rebound.  °   Hernia: No hernia is present.  °Musculoskeletal:     °   General: Normal range of motion.  °Skin: °   General: Skin is warm and dry.  °Neurological:  °   General: No focal deficit present.  °   Mental Status: She is alert.  °Psychiatric:     °   Mood and Affect: Mood normal.     °   Behavior: Behavior normal.  ° ° °ED Results / Procedures / Treatments   °Labs °(all labs ordered are listed, but only abnormal results are displayed) °Labs Reviewed  °URINALYSIS, ROUTINE W REFLEX MICROSCOPIC - Abnormal; Notable for the   the following components:      Result Value   APPearance CLOUDY (*)    Hgb urine dipstick SMALL (*)    Protein, ur >=300 (*)    Leukocytes,Ua LARGE (*)    WBC, UA >50 (*)    Bacteria, UA RARE (*)    All other components within normal limits  URINE CULTURE    EKG None  Radiology No results found.  Procedures Procedures    Medications Ordered in ED Medications - No data to display  ED Course/ Medical Decision Making/ A&P                           Medical Decision Making Amount and/or Complexity of Data Reviewed Labs: ordered.   Patient presents today with concerns regarding unresolved UTI since the end of January, failed Keflex.  Presents today with persistent dysuria seeking antibiotic recommendations after being placed on Levaquin urgent care earlier today.  She is concerned regarding black box warning and multiple side effects associated with Levaquin considering her other comorbid conditions. Denies any flank pain or abdominal pain, fevers, or chills.  Multiple UTIs in the past, states that this episode is similar.  I, Lavonna Rua, PA-C, personally reviewed and evaluated these lab results supported by medical decision making  UA reveals evidence of UTI, culture pending at this time.  Significant proteinuria present as well per  baseline consistent with patients known CKD.  Patient is afebrile, nontoxic-appearing, and in no acute distress with reassuring vital signs.  Her bilateral flanks and abdomen are nontender on exam, therefore low suspicion for pyelonephritis at this time.  Discussed antibiotic recommendations with pharmacy who reviewed patient's chart and recommends Cefdinir. Discussed with patient who is understanding and amenable with plan. She is stable for discharge at this time. Educated on red flag symptoms that would prompt immediate return. Discharged in stable condition.   This is a shared visit with supervising physician Dr. Francia Greaves who has independently evaluated patient & provided guidance in evaluation/management/disposition, in agreement with care   Final Clinical Impression(s) / ED Diagnoses Final diagnoses:  Acute cystitis with hematuria    Rx / DC Orders ED Discharge Orders          Ordered    cefdinir (OMNICEF) 300 MG capsule  Daily        07/06/21 1404          An After Visit Summary was printed and given to the patient.     Nestor Lewandowsky 07/06/21 1411    Valarie Merino, MD 07/06/21 [phone]

## 2021-07-06 NOTE — ED Triage Notes (Signed)
Patient states she went to an UC today for dysuria. Patient states she was told she had a bladder infection and to come to the ED. Patient states she was given 3 pills and did not take because she was afraid of the side effects. Patient states she had a UTI in January and was given keflex which did not completely go away

## 2021-07-08 DIAGNOSIS — R262 Difficulty in walking, not elsewhere classified: Secondary | ICD-10-CM | POA: Diagnosis not present

## 2021-07-08 DIAGNOSIS — R531 Weakness: Secondary | ICD-10-CM | POA: Diagnosis not present

## 2021-07-08 DIAGNOSIS — M545 Low back pain, unspecified: Secondary | ICD-10-CM | POA: Diagnosis not present

## 2021-07-09 DIAGNOSIS — N1832 Chronic kidney disease, stage 3b: Secondary | ICD-10-CM | POA: Diagnosis not present

## 2021-07-09 DIAGNOSIS — I129 Hypertensive chronic kidney disease with stage 1 through stage 4 chronic kidney disease, or unspecified chronic kidney disease: Secondary | ICD-10-CM | POA: Diagnosis not present

## 2021-07-10 DIAGNOSIS — R531 Weakness: Secondary | ICD-10-CM | POA: Diagnosis not present

## 2021-07-10 DIAGNOSIS — R262 Difficulty in walking, not elsewhere classified: Secondary | ICD-10-CM | POA: Diagnosis not present

## 2021-07-10 DIAGNOSIS — M545 Low back pain, unspecified: Secondary | ICD-10-CM | POA: Diagnosis not present

## 2021-07-10 LAB — URINE CULTURE: Culture: >=100000 — AB

## 2021-07-11 ENCOUNTER — Other Ambulatory Visit: Payer: Self-pay | Admitting: Family Medicine

## 2021-07-11 ENCOUNTER — Encounter: Payer: Self-pay | Admitting: Family Medicine

## 2021-07-11 ENCOUNTER — Telehealth: Payer: Self-pay | Admitting: Emergency Medicine

## 2021-07-11 ENCOUNTER — Other Ambulatory Visit: Payer: Self-pay

## 2021-07-11 ENCOUNTER — Ambulatory Visit (INDEPENDENT_AMBULATORY_CARE_PROVIDER_SITE_OTHER): Payer: Medicare Other | Admitting: Family Medicine

## 2021-07-11 VITALS — BP 189/76 | HR 82 | Wt 232.8 lb

## 2021-07-11 DIAGNOSIS — A498 Other bacterial infections of unspecified site: Secondary | ICD-10-CM | POA: Diagnosis not present

## 2021-07-11 DIAGNOSIS — N3001 Acute cystitis with hematuria: Secondary | ICD-10-CM | POA: Diagnosis not present

## 2021-07-11 DIAGNOSIS — I152 Hypertension secondary to endocrine disorders: Secondary | ICD-10-CM | POA: Diagnosis not present

## 2021-07-11 DIAGNOSIS — N390 Urinary tract infection, site not specified: Secondary | ICD-10-CM | POA: Diagnosis not present

## 2021-07-11 DIAGNOSIS — E1159 Type 2 diabetes mellitus with other circulatory complications: Secondary | ICD-10-CM

## 2021-07-11 DIAGNOSIS — B965 Pseudomonas (aeruginosa) (mallei) (pseudomallei) as the cause of diseases classified elsewhere: Secondary | ICD-10-CM

## 2021-07-11 DIAGNOSIS — Z794 Long term (current) use of insulin: Secondary | ICD-10-CM | POA: Diagnosis not present

## 2021-07-11 DIAGNOSIS — E1140 Type 2 diabetes mellitus with diabetic neuropathy, unspecified: Secondary | ICD-10-CM

## 2021-07-11 LAB — POCT URINALYSIS DIP (MANUAL ENTRY)
Bilirubin, UA: NEGATIVE
Glucose, UA: NEGATIVE mg/dL
Ketones, POC UA: NEGATIVE mg/dL
Nitrite, UA: NEGATIVE
Protein Ur, POC: >=300 mg/dL — AB
Spec Grav, UA: 1.02 (ref 1.010–1.025)
Urobilinogen, UA: 0.2 E.U./dL
pH, UA: 6 (ref 5.0–8.0)

## 2021-07-11 LAB — POCT UA - MICROSCOPIC ONLY: WBC, Ur, HPF, POC: >20 (ref 0–5)

## 2021-07-11 MED ORDER — SPIRONOLACTONE 25 MG PO TABS: 25.0000 mg | ORAL_TABLET | Freq: Every day | ORAL | 3 refills | Status: DC

## 2021-07-11 MED ORDER — CIPROFLOXACIN HCL 500 MG PO TABS: 500.0000 mg | ORAL_TABLET | Freq: Two times a day (BID) | ORAL | 0 refills | Status: DC

## 2021-07-11 NOTE — Telephone Encounter (Signed)
Post ED Visit - Positive Culture Follow-up  Culture report reviewed by antimicrobial stewardship pharmacist: [place] Team []  Elenor Quinones, Pharm.D. []  Heide Guile, Pharm.D., BCPS AQ-ID []  Parks Neptune, Pharm.D., BCPS []  Alycia Rossetti, Pharm.D., BCPS []  [place], Pharm.D., BCPS, AAHIVP []  Legrand Como, Pharm.D., BCPS, AAHIVP []  Salome Arnt, PharmD, BCPS []  Johnnette Gourd, PharmD, BCPS []  Hughes Better, PharmD, BCPS []  Leeroy Cha, PharmD []  Laqueta Linden, PharmD, BCPS []  Albertina Parr, PharmD  [place] Team []  Leodis Sias, PharmD []  Lindell Spar, PharmD []  Royetta Asal, PharmD []  Graylin Shiver, Rph []  Rema Fendt) Glennon Mac, PharmD []  Arlyn Dunning, PharmD []  Netta Cedars, PharmD []  Dia Sitter, PharmD []  Leone Haven, PharmD []  Gretta Arab, PharmD []  Theodis Shove, PharmD []  Peggyann Juba, PharmD []  Reuel Boom, PharmD   Positive urine culture Treated with cefdinir, faxed results to [facility] medicine, Dr Noni Saupe [phone] ,no further patient follow-up is required at this time.  Hazle Nordmann 07/11/2021, 11:27 AM

## 2021-07-11 NOTE — Assessment & Plan Note (Signed)
Controlled, last A1c 5.5%. Decrease lantus to 15U. If FBG <100, decrease lantus by 5U. Once you reach 10U per day, can stop lantus all together, or stop lantus when starts ozempic 1 mg qw.

## 2021-07-11 NOTE — Patient Instructions (Addendum)
It was a pleasure to see you today!  We will get some labs today.  If they are abnormal or we need to do something about them, I will call you.  If they are normal, I will send you a message on MyChart (if it is active) or a letter in the mail.  If you don't hear from [place] in 2 weeks, please call the office  [phone]. I will contact Dr. Posey Pronto about your recent blood work For blood pressure:  - please take the losartan 100 mg once a day - coreg 25 mg twice a day - start spironolactone 25 mg once a day. - Please make a follow up appt with me in 2 weeks. I will need blood work at that next visit if you had not gotten any blood work in the last week. For your UTI: you had an e. Coli and pseudomonas infection. Please complete the cefdinir for e. Coli UTI. To treat pseudomonas, please take cipro 500 mg once per day for 1 week. For diabetes: if your fasting blood glucose is under 100, please decrease your lantus by 5 units. The day that you start the ozempic 1 mg (the higher dose), decrease your lantus by 10 units. Please call our office if your fasting blood glucose is over 200.   Be Well,  Dr. Chauncey Reading

## 2021-07-11 NOTE — Progress Notes (Signed)
ED Antimicrobial Stewardship Positive Culture Follow Up   Michelle Abbott is an 76 y.o. female who presented to [facility] on 07/06/2021 with a chief complaint of  Chief Complaint  Patient presents with   Cystitis    Recent Results (from the past 720 hour(s))  Urine Culture     Status: Abnormal   Collection Time: 07/06/21  1:12 PM   Specimen: Urine, Clean Catch  Result Value Ref Range Status   Specimen Description   Final    URINE, CLEAN CATCH Performed at [facility], [place] [address], [place], [place] [address]    Special Requests   Final    NONE Performed at [facility], [place] [address] [place], [place] [address]    Culture (A)  Final    >=100,000 COLONIES/mL ESCHERICHIA COLI 60,000 COLONIES/mL PSEUDOMONAS AERUGINOSA    Report Status 07/10/2021 FINAL  Final   Organism ID, Bacteria ESCHERICHIA COLI (A)  Final   Organism ID, Bacteria PSEUDOMONAS AERUGINOSA (A)  Final      Susceptibility   Escherichia coli - MIC*    AMPICILLIN <=2 SENSITIVE Sensitive     CEFAZOLIN <=4 SENSITIVE Sensitive     CEFEPIME <=0.12 SENSITIVE Sensitive     CEFTRIAXONE <=0.25 SENSITIVE Sensitive     CIPROFLOXACIN <=0.25 SENSITIVE Sensitive     GENTAMICIN <=1 SENSITIVE Sensitive     IMIPENEM <=0.25 SENSITIVE Sensitive     NITROFURANTOIN <=16 SENSITIVE Sensitive     TRIMETH/SULFA <=20 SENSITIVE Sensitive     AMPICILLIN/SULBACTAM <=2 SENSITIVE Sensitive     PIP/TAZO <=4 SENSITIVE Sensitive     * >=100,000 COLONIES/mL ESCHERICHIA COLI   Pseudomonas aeruginosa - MIC*    CEFTAZIDIME 4 SENSITIVE Sensitive     CIPROFLOXACIN <=0.25 SENSITIVE Sensitive     GENTAMICIN <=1 SENSITIVE Sensitive     IMIPENEM 2 SENSITIVE Sensitive     PIP/TAZO <=4 SENSITIVE Sensitive     CEFEPIME 0.5 SENSITIVE Sensitive     * 60,000 COLONIES/mL PSEUDOMONAS AERUGINOSA    []  Treated with Cefdinir 300mg  po capsule x 7 days, follow up with PCP for symptom check. Pt has family medicine  appointment today 07/11/21 @ 1:30pm. Can recollect urine and culture if needed.  New antibiotic prescription: No new antibiotic needed at this time.  ED Provider: Suella Broad, PA-C   Elpidio Anis, PharmD Candidate [phone] 07/11/2021, 9:43 AM Clinical Pharmacist Monday - Friday phone -  [phone] Saturday - Sunday phone - [phone]

## 2021-07-11 NOTE — Assessment & Plan Note (Signed)
Not controlled, chronic.  - continue losartan 100 mg qd - continue coreg 25 mg BID - stop nifedipine due to leg swelling - start spironolactone 25 mg qd - follow up 2 weeks for BP and BMP check - Will request labs from nephrology

## 2021-07-11 NOTE — Progress Notes (Signed)
° ° °  SUBJECTIVE:   CHIEF COMPLAINT / HPI:   HTN: BP today is not at goal: 189/76. Currently on losartan 100 mg qd, coreg 25 mg BID. She is not presently on hydralazine, stopped nifedipine due to leg swelling. She did not yet get spironolactone 25 mg qd. Patient reports that she had a BMP done on Wednesday with Dr. Posey Pronto, nephrology. I reviewed last visit with Dr. Posey Pronto, 06/30/21, which had normal BMP (no electrolyte derangement, K 4.9) from 06/23/21. Will request labs from Dr. Posey Pronto.  UTI: chronic uti symptoms prompted patient to go to [facility] ED on 07/06/21. She was treated with cefdinir. Urine cx returned e coli pan sensitive >100k CFUs, but also pseudomonas 60,000K CFUs. She completed her cefdinir treatment. She had been given levaquin at an urgent care, but was reluctant to take it due to side effect profile. Today patient states that she has persistent dysuria, urinary urgency, urinary frequency.  DM2: FBG 88-116 at home with ozempic, still finishing 0.5 mg pens but received the 1 mg pens. Using lantus 20U qAM. Goal is to get her off lantus completely.  PERTINENT  PMH / PSH: DM2  OBJECTIVE:   BP (!) 189/76    Pulse 82    Wt 232 lb 12.8 oz (105.6 kg)    SpO2 99%    BMI 41.24 kg/m   Nursing note and vitals reviewed GEN: age-appropriate, AAW, resting comfortably in chair, NAD, class III obesity Cardiac: Regular rate and rhythm. Normal S1/S2. No murmurs, rubs, or gallops appreciated. 2+ radial pulses. Lungs: Clear bilaterally to ascultation. No increased WOB, no accessory muscle usage. No w/r/r. Abdomen: Soft, NT, ND. Normoactive bowel sounds. No rebound or guarding. No CVA tenderness. Neuro: AOx3  Ext: no edema Psych: Pleasant and appropriate   ASSESSMENT/PLAN:   Type 2 diabetes mellitus with diabetic neuropathy, with long-term current use of insulin (HCC) Controlled, last A1c 5.5%. Decrease lantus to 15U. If FBG <100, decrease lantus by 5U. Once you reach 10U per day, can stop lantus all  together, or stop lantus when starts ozempic 1 mg qw.  Hypertension associated with diabetes ([place]) Not controlled, chronic.  - continue losartan 100 mg qd - continue coreg 25 mg BID - stop nifedipine due to leg swelling - start spironolactone 25 mg qd - follow up 2 weeks for BP and BMP check - Will request labs from nephrology  Bacterial infection due to Pseudomonas Start cipro 500 mg qd x7 days given CKD IV (crcl ~20). Discussed that she had a sensitivity to IV cipro, not a true allergy, ok to give oral cipro. Discussed signs and sx of anaphylaxis. Ur culture sent.     Gladys Damme, MD [place]

## 2021-07-11 NOTE — Assessment & Plan Note (Signed)
Start cipro 500 mg qd x7 days given CKD IV (crcl ~20). Discussed that she had a sensitivity to IV cipro, not a true allergy, ok to give oral cipro. Discussed signs and sx of anaphylaxis. Ur culture sent.

## 2021-07-12 ENCOUNTER — Other Ambulatory Visit: Payer: Self-pay | Admitting: Family Medicine

## 2021-07-16 DIAGNOSIS — R3121 Asymptomatic microscopic hematuria: Secondary | ICD-10-CM | POA: Diagnosis not present

## 2021-07-16 LAB — URINE CULTURE

## 2021-07-22 DIAGNOSIS — R262 Difficulty in walking, not elsewhere classified: Secondary | ICD-10-CM | POA: Diagnosis not present

## 2021-07-22 DIAGNOSIS — R531 Weakness: Secondary | ICD-10-CM | POA: Diagnosis not present

## 2021-07-22 DIAGNOSIS — M545 Low back pain, unspecified: Secondary | ICD-10-CM | POA: Diagnosis not present

## 2021-07-24 NOTE — Telephone Encounter (Signed)
Sent to PCP.  Salvatore Marvel, CMA ? ?

## 2021-07-28 DIAGNOSIS — I739 Peripheral vascular disease, unspecified: Secondary | ICD-10-CM | POA: Diagnosis not present

## 2021-07-28 DIAGNOSIS — E1151 Type 2 diabetes mellitus with diabetic peripheral angiopathy without gangrene: Secondary | ICD-10-CM | POA: Diagnosis not present

## 2021-07-28 DIAGNOSIS — L603 Nail dystrophy: Secondary | ICD-10-CM | POA: Diagnosis not present

## 2021-07-29 DIAGNOSIS — R531 Weakness: Secondary | ICD-10-CM | POA: Diagnosis not present

## 2021-07-29 DIAGNOSIS — R262 Difficulty in walking, not elsewhere classified: Secondary | ICD-10-CM | POA: Diagnosis not present

## 2021-07-29 DIAGNOSIS — M545 Low back pain, unspecified: Secondary | ICD-10-CM | POA: Diagnosis not present

## 2021-08-01 ENCOUNTER — Encounter: Payer: Self-pay | Admitting: Family Medicine

## 2021-08-01 ENCOUNTER — Ambulatory Visit (INDEPENDENT_AMBULATORY_CARE_PROVIDER_SITE_OTHER): Payer: Medicare Other | Admitting: Family Medicine

## 2021-08-01 ENCOUNTER — Other Ambulatory Visit: Payer: Self-pay

## 2021-08-01 VITALS — HR 70 | Ht 63.0 in | Wt 231.1 lb

## 2021-08-01 DIAGNOSIS — N189 Chronic kidney disease, unspecified: Secondary | ICD-10-CM | POA: Diagnosis not present

## 2021-08-01 DIAGNOSIS — M1A0710 Idiopathic chronic gout, right ankle and foot, without tophus (tophi): Secondary | ICD-10-CM

## 2021-08-01 DIAGNOSIS — N184 Chronic kidney disease, stage 4 (severe): Secondary | ICD-10-CM | POA: Diagnosis not present

## 2021-08-01 DIAGNOSIS — B965 Pseudomonas (aeruginosa) (mallei) (pseudomallei) as the cause of diseases classified elsewhere: Secondary | ICD-10-CM

## 2021-08-01 DIAGNOSIS — I739 Peripheral vascular disease, unspecified: Secondary | ICD-10-CM

## 2021-08-01 DIAGNOSIS — E1140 Type 2 diabetes mellitus with diabetic neuropathy, unspecified: Secondary | ICD-10-CM | POA: Diagnosis not present

## 2021-08-01 DIAGNOSIS — E785 Hyperlipidemia, unspecified: Secondary | ICD-10-CM

## 2021-08-01 DIAGNOSIS — N390 Urinary tract infection, site not specified: Secondary | ICD-10-CM | POA: Diagnosis not present

## 2021-08-01 DIAGNOSIS — E782 Mixed hyperlipidemia: Secondary | ICD-10-CM | POA: Diagnosis not present

## 2021-08-01 DIAGNOSIS — Z794 Long term (current) use of insulin: Secondary | ICD-10-CM

## 2021-08-01 DIAGNOSIS — I152 Hypertension secondary to endocrine disorders: Secondary | ICD-10-CM

## 2021-08-01 DIAGNOSIS — D509 Iron deficiency anemia, unspecified: Secondary | ICD-10-CM

## 2021-08-01 DIAGNOSIS — E1159 Type 2 diabetes mellitus with other circulatory complications: Secondary | ICD-10-CM

## 2021-08-01 DIAGNOSIS — D631 Anemia in chronic kidney disease: Secondary | ICD-10-CM

## 2021-08-01 DIAGNOSIS — E1169 Type 2 diabetes mellitus with other specified complication: Secondary | ICD-10-CM | POA: Diagnosis not present

## 2021-08-01 MED ORDER — ATORVASTATIN CALCIUM 40 MG PO TABS: 40.0000 mg | ORAL_TABLET | Freq: Every day | ORAL | 3 refills | Status: DC

## 2021-08-01 NOTE — Progress Notes (Signed)
? ? ?SUBJECTIVE:  ? ?CHIEF COMPLAINT / HPI:  ? ?HTN: BP not at goal: 170s/90s, even on repeat. Patient had not started spironolactone as was the plan last two visits. Currently asymptomatic. She is currently on losartan 100 mg, coreg 25 mg BID. She was only chlorthalidone in November 2022, unclear why she stopped taking this. Unable to get labs from [facility], will obtain CBC, BMP today.  ? ?DM2: Patient has completely come off lantus, on ozempic 0.5 mg qw, plan to increase to 1 mg qw when she runs out of 0.5 mg supply. FBG's between 100-150. A1c appropriate at 5.6% on 06/20/21. ? ?UTI: Patient reports that she no longer has dysuria. Still unclear as to how she developed ecoli and pseudomonas positive urine infection. She completed course of cipro. Will obtain ua/ur cx today to ensure resolution of infection.  ? ?Back pain/shoulder: patient has a history of known severe lumbar stenosis, s/p L4-L5 fusion. She still is in pain most days in the same location. She is doing physical therapy, uses stretching, home exercises, heat/ice with some mild relief. However she has 8/10 back pain. She reports that she has left shoulder pain that started after her last PT session with weight lifting and is concerned for ruptured tendon after using cipro. ? ?PERTINENT  PMH / PSH: CKD stage IV, DM2, lumbar spondylosis ? ?OBJECTIVE:  ? ?Pulse 70   Ht _0  (1.6 m)   Wt 231 lb 2 oz (104.8 kg)   SpO2 97%   BMI 40.94 kg/m?   ?Nursing note and vitals reviewed ?GEN: elderly AAW, resting comfortably in chair, NAD, class III obesity ?Cardiac: Regular rate and rhythm. Normal S1/S2. No murmurs, rubs, or gallops appreciated. 2+ radial pulses. No JVP. 2+ DP pulses.  ?Lungs: Clear bilaterally to ascultation. No increased WOB, no accessory muscle usage. No w/r/r. ?L shoulder:  No evidence of bony deformity, asymmetry, or muscle atrophy; No TTP at [facility] joint. Full active and passive range of motion (180? flex /60?Ext /150?Abd  /90?ER /70?IR). Strength 5/5 throughout. Sensation intact. Hawkins and Empty can tests negative. ?Lumbar spine: ?- Inspection: no gross deformity or asymmetry, swelling or ecchymosis. No skin changes ?- Palpation: No TTP over the spinous processes, paraspinal muscles, or SI joints b/l ?- ROM: full ROM of the lumbar spine in flexion, limited in extension with some  pain ?- Strength: 5/5 strength of lower extremity in L4-S1 nerve root distributions b/l ?- Neuro: sensation intact in the L4-S1 nerve root distribution b/l, 2+ L4 and S1 reflexes ?Neuro: AOx3  ?Ext: no edema ?Psych: Pleasant and appropriate  ? ?ASSESSMENT/PLAN:  ? ?Type 2 diabetes mellitus with diabetic neuropathy, with long-term current use of insulin ([place]) ?Patient has been able to completely stop lantus with appropriate fasting blood sugars. As soon as she completes ozempic 0.5 mg doses, increase to 1 mg qw doses. Goal is for weight loss as well as DM control. Follow up 3 months for this. She is on an ARB (losartan), and statin (switching to atorvastatin, see below). ? ?Obesity, Class III, BMI 40-49.9 (morbid obesity) ([place]) ?Plan to increase to ozempic 1 mg qw when out of ozempic 0.25 mg stock at home. ? ?Hyperlipidemia associated with type 2 diabetes mellitus ([place]) ?Patient has CKD, was stage IV, but lab work from this visit shows interval worsening to eGFR of 14, aka Stage V. Unclear yet if this is temporary or further progression of disease. Counseled patient to change rosuvastatin to atorvastatin for renal function as  she needs high intensity statin. ?-Start atorvastatin 40 mg ? ?Anemia secondary to renal failure ?CBC shows overall baseline hgb at 9.5. MCV is normocytic. Ferritin is elevated. Likely anemia of chronic disease. ? ?Gout ?With eGFR of 14, will change allopurinol dosing to 50 mg twice weekly. ? ?CKD (chronic kidney disease), stage IV ([place]) ?Will send message to nephrology as BMP returned with cr 3.28, eGFR 14. Appears as if cr was the  same 3 months ago, suspect interval progression of CKD from IV to V. Will dose adjust all medications. ? ?Hypertension associated with diabetes ([place]) ?Chronic, uncontrolled.  Her hypertension is in a place where it is stable, she is asymptomatic, however we will need better control and options for medications are limited due to drug sensitivity as well as renal function.  I did not have access to recent labs from February regarding patient's renal function, discussed with her nephrologist Dr. Posey Pronto who said he agreed with starting spironolactone.  Patient could not recall at her appointment which medication she was on, called patient at home and she clarified her medications by looking at the bottles.  She has been taking spironolactone daily for about 2 weeks.  She does not have hyperkalemia, very reassuring.  However, she also does not have blood pressure control.  The only 2 options for blood pressure control at this point would be a thiazide or loop diuretic, but these do not work well at GFR of less than 30, which hers is.  She also reports that she had sedation with hydralazine and that is why Dr. Posey Pronto discontinued it.  By the kidney failure risk calculator, she is at risk for dialysis is 75% in 2 years.  Dr. Posey Pronto said they are preparing for dialysis.  Will discuss further with Dr. Posey Pronto about next steps for blood pressure control. ?  ? ? ?Gladys Damme, MD ?[place]  ? ? ?

## 2021-08-01 NOTE — Patient Instructions (Addendum)
It was a pleasure to see you today! ? ?Continue with therapy, use the salonpas for your shoulder soreness ?We will get some labs today.  If they are abnormal or we need to do something about them, I will call you.  If they are normal, I will send you a message on MyChart (if it is active) or a letter in the mail.  If you don't hear from [place] in 2 weeks, please call the office  [phone]. ?I will touch base with your nephrologist and we will follow up about a plan for your blood pressure ?Start atorvatstatin 40 mg to protect you from heart attack and stroke ? ? ? ?Be Well, ? ?Dr. Chauncey Reading ? ?

## 2021-08-02 LAB — FERRITIN: Ferritin: 1304 ng/mL — ABNORMAL HIGH (ref 15–150)

## 2021-08-02 LAB — URINALYSIS
Bilirubin, UA: NEGATIVE
Glucose, UA: NEGATIVE
Ketones, UA: NEGATIVE
Leukocytes,UA: NEGATIVE
Nitrite, UA: NEGATIVE
RBC, UA: NEGATIVE
Specific Gravity, UA: 1.012 (ref 1.005–1.030)
Urobilinogen, Ur: 0.2 mg/dL (ref 0.2–1.0)
pH, UA: 6 (ref 5.0–7.5)

## 2021-08-02 LAB — CBC WITH DIFFERENTIAL/PLATELET
Basophils Absolute: 0 10*3/uL (ref 0.0–0.2)
Basos: 0 %
EOS (ABSOLUTE): 0.2 10*3/uL (ref 0.0–0.4)
Eos: 4 %
Hematocrit: 30.7 % — ABNORMAL LOW (ref 34.0–46.6)
Hemoglobin: 9.5 g/dL — ABNORMAL LOW (ref 11.1–15.9)
Immature Grans (Abs): 0 10*3/uL (ref 0.0–0.1)
Immature Granulocytes: 0 %
Lymphocytes Absolute: 1.1 10*3/uL (ref 0.7–3.1)
Lymphs: 23 %
MCH: 26.2 pg — ABNORMAL LOW (ref 26.6–33.0)
MCHC: 30.9 g/dL — ABNORMAL LOW (ref 31.5–35.7)
MCV: 85 fL (ref 79–97)
Monocytes Absolute: 0.5 10*3/uL (ref 0.1–0.9)
Monocytes: 9 %
Neutrophils Absolute: 3.1 10*3/uL (ref 1.4–7.0)
Neutrophils: 64 %
Platelets: 246 10*3/uL (ref 150–450)
RBC: 3.63 x10E6/uL — ABNORMAL LOW (ref 3.77–5.28)
RDW: 16.1 % — ABNORMAL HIGH (ref 11.7–15.4)
WBC: 4.8 10*3/uL (ref 3.4–10.8)

## 2021-08-02 LAB — BASIC METABOLIC PANEL
BUN/Creatinine Ratio: 13 (ref 12–28)
BUN: 44 mg/dL — ABNORMAL HIGH (ref 8–27)
CO2: 23 mmol/L (ref 20–29)
Calcium: 9.4 mg/dL (ref 8.7–10.3)
Chloride: 110 mmol/L — ABNORMAL HIGH (ref 96–106)
Creatinine, Ser: 3.28 mg/dL — ABNORMAL HIGH (ref 0.57–1.00)
Glucose: 92 mg/dL (ref 70–99)
Potassium: 4.4 mmol/L (ref 3.5–5.2)
Sodium: 144 mmol/L (ref 134–144)
eGFR: 14 mL/min/{1.73_m2} — ABNORMAL LOW (ref >59)

## 2021-08-03 ENCOUNTER — Telehealth: Payer: Self-pay | Admitting: Family Medicine

## 2021-08-03 LAB — URINE CULTURE

## 2021-08-03 NOTE — Assessment & Plan Note (Signed)
CBC shows overall baseline hgb at 9.5. MCV is normocytic. Ferritin is elevated. Likely anemia of chronic disease. ?

## 2021-08-03 NOTE — Assessment & Plan Note (Signed)
Will send message to nephrology as BMP returned with cr 3.28, eGFR 14. Appears as if cr was the same 3 months ago, suspect interval progression of CKD from IV to V. Will dose adjust all medications. ?

## 2021-08-03 NOTE — Assessment & Plan Note (Signed)
Patient has CKD, was stage IV, but lab work from this visit shows interval worsening to eGFR of 14, aka Stage V. Unclear yet if this is temporary or further progression of disease. Counseled patient to change rosuvastatin to atorvastatin for renal function as she needs high intensity statin. ?-Start atorvastatin 40 mg ?

## 2021-08-03 NOTE — Assessment & Plan Note (Signed)
Chronic, uncontrolled.  Her hypertension is in a place where it is stable, she is asymptomatic, however we will need better control and options for medications are limited due to drug sensitivity as well as renal function.  I did not have access to recent labs from February regarding patient's renal function, discussed with her nephrologist Dr. Posey Pronto who said he agreed with starting spironolactone.  Patient could not recall at her appointment which medication she was on, called patient at home and she clarified her medications by looking at the bottles.  She has been taking spironolactone daily for about 2 weeks.  She does not have hyperkalemia, very reassuring.  However, she also does not have blood pressure control.  The only 2 options for blood pressure control at this point would be a thiazide or loop diuretic, but these do not work well at GFR of less than 30, which hers is.  She also reports that she had sedation with hydralazine and that is why Dr. Posey Pronto discontinued it.  By the kidney failure risk calculator, she is at risk for dialysis is 75% in 2 years.  Dr. Posey Pronto said they are preparing for dialysis.  Will discuss further with Dr. Posey Pronto about next steps for blood pressure control. ?

## 2021-08-03 NOTE — Assessment & Plan Note (Signed)
With eGFR of 14, will change allopurinol dosing to 50 mg twice weekly. ?

## 2021-08-03 NOTE — Assessment & Plan Note (Addendum)
Patient has been able to completely stop lantus with appropriate fasting blood sugars. As soon as she completes ozempic 0.5 mg doses, increase to 1 mg qw doses. Goal is for weight loss as well as DM control. Follow up 3 months for this. She is on an ARB (losartan), and statin (switching to atorvastatin, see below). ?

## 2021-08-03 NOTE — Assessment & Plan Note (Signed)
Plan to increase to ozempic 1 mg qw when out of ozempic 0.25 mg stock at home. ?

## 2021-08-03 NOTE — Telephone Encounter (Signed)
Confirmed medications with patient: She said that she is currently taking the spironolactone 25 mg daily, as well as losartan 100 mg daily, as well as Coreg 25 mg twice daily.  She is asymptomatic today.  Her potassium was appropriate at 4.4.  We discussed decreasing allopurinol to 50 mg twice a week given the change in GFR.  She is not taking colchicine.  She also reports taking vitamin D 1000 units daily, and ferrous sulfate 325 mg daily.  She likely no longer needs the ferrous sulfate suspect that she has anemia of chronic disease given stable but continuously low hemoglobin despite appropriate iron repletion.  Will discuss case with patient's nephrologist Dr. Posey Pronto.  Discussed all lab results with patient. ? ?Gladys Damme, MD ?[facility], PGY-3 ? ?

## 2021-08-07 DIAGNOSIS — R262 Difficulty in walking, not elsewhere classified: Secondary | ICD-10-CM | POA: Diagnosis not present

## 2021-08-07 DIAGNOSIS — M545 Low back pain, unspecified: Secondary | ICD-10-CM | POA: Diagnosis not present

## 2021-08-07 DIAGNOSIS — R531 Weakness: Secondary | ICD-10-CM | POA: Diagnosis not present

## 2021-08-08 ENCOUNTER — Other Ambulatory Visit: Payer: Self-pay | Admitting: Family Medicine

## 2021-08-08 DIAGNOSIS — E1140 Type 2 diabetes mellitus with diabetic neuropathy, unspecified: Secondary | ICD-10-CM

## 2021-08-14 DIAGNOSIS — M545 Low back pain, unspecified: Secondary | ICD-10-CM | POA: Diagnosis not present

## 2021-08-14 DIAGNOSIS — R531 Weakness: Secondary | ICD-10-CM | POA: Diagnosis not present

## 2021-08-14 DIAGNOSIS — R262 Difficulty in walking, not elsewhere classified: Secondary | ICD-10-CM | POA: Diagnosis not present

## 2021-08-21 DIAGNOSIS — R262 Difficulty in walking, not elsewhere classified: Secondary | ICD-10-CM | POA: Diagnosis not present

## 2021-08-21 DIAGNOSIS — R531 Weakness: Secondary | ICD-10-CM | POA: Diagnosis not present

## 2021-08-21 DIAGNOSIS — M545 Low back pain, unspecified: Secondary | ICD-10-CM | POA: Diagnosis not present

## 2021-08-25 ENCOUNTER — Other Ambulatory Visit: Payer: Self-pay | Admitting: Family Medicine

## 2021-08-28 ENCOUNTER — Other Ambulatory Visit: Payer: Self-pay | Admitting: Family Medicine

## 2021-08-28 DIAGNOSIS — R531 Weakness: Secondary | ICD-10-CM | POA: Diagnosis not present

## 2021-08-28 DIAGNOSIS — R262 Difficulty in walking, not elsewhere classified: Secondary | ICD-10-CM | POA: Diagnosis not present

## 2021-08-28 DIAGNOSIS — M545 Low back pain, unspecified: Secondary | ICD-10-CM | POA: Diagnosis not present

## 2021-09-01 ENCOUNTER — Telehealth: Payer: Self-pay

## 2021-09-01 NOTE — Telephone Encounter (Signed)
Patient calls nurse line requesting clarification on Hydralazine.  ? ?Patient reports she "remembered" she stopped taking it for a "while," however could not remember why. Patient reports she asked for a refill to see if she was supposed to be taking and MD approved.  ? ?Per last office note patient reported Hydralazine made her sleepy therefore she stopped taking.  ? ?Patient reports she is taking losartan and spironolactone now for BP, however would like PCP opinion on restarting Hydralazine.  ? ?Will forward to PCP.  ? ? ?

## 2021-09-02 NOTE — Telephone Encounter (Signed)
Called pt re: hydralazine message. Patient could not go to appt yesterday with Dr Posey Pronto (nephrologist) as her transportation fell through. She has not taken her BP at home. Per my discussion with pt last time and messaging with Dr. Posey Pronto, hydralazine had been discontinued due to sleepiness.  ? ?I recommend before patient start any medications that we get accurate ambulatory blood pressures. She reports she has not taken her BP at home in the last couple of weeks. I recommend she take her BP at the same time every day and log it for 5-7 days and then call or mychart the BP. I will send message to Dr. Posey Pronto with updates as I have them. ? ?Gladys Damme, MD ?[facility], PGY-3 ? ?

## 2021-09-03 ENCOUNTER — Other Ambulatory Visit: Payer: Self-pay | Admitting: Family Medicine

## 2021-09-04 DIAGNOSIS — R262 Difficulty in walking, not elsewhere classified: Secondary | ICD-10-CM | POA: Diagnosis not present

## 2021-09-04 DIAGNOSIS — R531 Weakness: Secondary | ICD-10-CM | POA: Diagnosis not present

## 2021-09-04 DIAGNOSIS — M545 Low back pain, unspecified: Secondary | ICD-10-CM | POA: Diagnosis not present

## 2021-09-11 DIAGNOSIS — M545 Low back pain, unspecified: Secondary | ICD-10-CM | POA: Diagnosis not present

## 2021-09-11 DIAGNOSIS — R262 Difficulty in walking, not elsewhere classified: Secondary | ICD-10-CM | POA: Diagnosis not present

## 2021-09-11 DIAGNOSIS — R531 Weakness: Secondary | ICD-10-CM | POA: Diagnosis not present

## 2021-09-18 DIAGNOSIS — R531 Weakness: Secondary | ICD-10-CM | POA: Diagnosis not present

## 2021-09-18 DIAGNOSIS — R262 Difficulty in walking, not elsewhere classified: Secondary | ICD-10-CM | POA: Diagnosis not present

## 2021-09-18 DIAGNOSIS — M545 Low back pain, unspecified: Secondary | ICD-10-CM | POA: Diagnosis not present

## 2021-09-24 ENCOUNTER — Other Ambulatory Visit: Payer: Self-pay

## 2021-09-24 MED ORDER — BD PEN NEEDLE SHORT U/F 31G X 8 MM MISC: 3 refills | Status: AC

## 2021-10-01 DIAGNOSIS — N184 Chronic kidney disease, stage 4 (severe): Secondary | ICD-10-CM | POA: Diagnosis not present

## 2021-10-01 DIAGNOSIS — I129 Hypertensive chronic kidney disease with stage 1 through stage 4 chronic kidney disease, or unspecified chronic kidney disease: Secondary | ICD-10-CM | POA: Diagnosis not present

## 2021-10-01 DIAGNOSIS — D631 Anemia in chronic kidney disease: Secondary | ICD-10-CM | POA: Diagnosis not present

## 2021-10-01 DIAGNOSIS — N390 Urinary tract infection, site not specified: Secondary | ICD-10-CM | POA: Diagnosis not present

## 2021-10-01 DIAGNOSIS — N2581 Secondary hyperparathyroidism of renal origin: Secondary | ICD-10-CM | POA: Diagnosis not present

## 2021-10-01 DIAGNOSIS — N189 Chronic kidney disease, unspecified: Secondary | ICD-10-CM | POA: Diagnosis not present

## 2021-10-14 DIAGNOSIS — R262 Difficulty in walking, not elsewhere classified: Secondary | ICD-10-CM | POA: Diagnosis not present

## 2021-10-14 DIAGNOSIS — R531 Weakness: Secondary | ICD-10-CM | POA: Diagnosis not present

## 2021-10-14 DIAGNOSIS — M545 Low back pain, unspecified: Secondary | ICD-10-CM | POA: Diagnosis not present

## 2021-10-21 ENCOUNTER — Encounter: Payer: Self-pay | Admitting: *Deleted

## 2021-10-21 DIAGNOSIS — N1832 Chronic kidney disease, stage 3b: Secondary | ICD-10-CM | POA: Diagnosis not present

## 2021-10-27 DIAGNOSIS — R262 Difficulty in walking, not elsewhere classified: Secondary | ICD-10-CM | POA: Diagnosis not present

## 2021-10-27 DIAGNOSIS — M545 Low back pain, unspecified: Secondary | ICD-10-CM | POA: Diagnosis not present

## 2021-10-27 DIAGNOSIS — R531 Weakness: Secondary | ICD-10-CM | POA: Diagnosis not present

## 2021-11-06 DIAGNOSIS — R262 Difficulty in walking, not elsewhere classified: Secondary | ICD-10-CM | POA: Diagnosis not present

## 2021-11-06 DIAGNOSIS — R531 Weakness: Secondary | ICD-10-CM | POA: Diagnosis not present

## 2021-11-06 DIAGNOSIS — M545 Low back pain, unspecified: Secondary | ICD-10-CM | POA: Diagnosis not present

## 2021-11-10 DIAGNOSIS — E1142 Type 2 diabetes mellitus with diabetic polyneuropathy: Secondary | ICD-10-CM | POA: Diagnosis not present

## 2021-11-20 ENCOUNTER — Ambulatory Visit: Payer: Medicare Other | Admitting: Family Medicine

## 2021-11-20 NOTE — Progress Notes (Deleted)
    SUBJECTIVE:   CHIEF COMPLAINT / HPI:   HTN: BP is***at goal:.  Currently taking losartan 100 mg, Coreg 25 mg twice daily hydralazine 50 mg twice daily, spironolactone 25 mg daily.  HLD: Patient started on atorvastatin due to CKD stage V.  Last LDL check  Diabetic Follow Up: Patient is a 76 y.o. female who present today for diabetic follow up.   Patient endorses {rwdmsmartlistproblems:24882}  Home medications include: Ozempic 1 mg q. weekly Patient endorses taking these medications as prescribed.***  Most recent A1Cs:  Lab Results  Component Value Date   HGBA1C 5.6 06/20/2021   HGBA1C 6.4 09/24/2020   HGBA1C 7.2 (H) 04/24/2020   Last Microalbumin, LDL, Creatinine: Lab Results  Component Value Date   LDLCALC 42 12/28/2019   CREATININE 3.28 (H) 08/01/2021   Patient {rwdoesdoesnot:24881} check blood glucose on a regular basis.  Patient {rwisisnot:24883} up to date on diabetic eye. Patient {rwisisnot:24883} up to date on diabetic foot exam.   PERTINENT  PMH / PSH: ***  OBJECTIVE:   There were no vitals taken for this visit.  ***  ASSESSMENT/PLAN:   No problem-specific Assessment & Plan notes found for this encounter.     Gladys Damme, MD [place]

## 2021-11-21 ENCOUNTER — Other Ambulatory Visit: Payer: Self-pay

## 2021-11-21 DIAGNOSIS — E1159 Type 2 diabetes mellitus with other circulatory complications: Secondary | ICD-10-CM

## 2021-11-21 DIAGNOSIS — M1A39X0 Chronic gout due to renal impairment, multiple sites, without tophus (tophi): Secondary | ICD-10-CM

## 2021-11-21 DIAGNOSIS — E1140 Type 2 diabetes mellitus with diabetic neuropathy, unspecified: Secondary | ICD-10-CM

## 2021-11-21 DIAGNOSIS — E782 Mixed hyperlipidemia: Secondary | ICD-10-CM

## 2021-11-21 MED ORDER — HYDRALAZINE HCL 50 MG PO TABS
50.0000 mg | ORAL_TABLET | Freq: Two times a day (BID) | ORAL | 1 refills | Status: DC
Start: 2021-11-21 — End: 2022-05-07

## 2021-11-21 MED ORDER — OZEMPIC (1 MG/DOSE) 4 MG/3ML ~~LOC~~ SOPN: PEN_INJECTOR | SUBCUTANEOUS | 3 refills | Status: DC

## 2021-11-21 MED ORDER — ATORVASTATIN CALCIUM 40 MG PO TABS: 40.0000 mg | ORAL_TABLET | Freq: Every day | ORAL | 3 refills | Status: DC

## 2021-11-21 MED ORDER — LANTUS SOLOSTAR 100 UNIT/ML ~~LOC~~ SOPN: 15.0000 [IU] | PEN_INJECTOR | Freq: Every day | SUBCUTANEOUS | 3 refills | Status: DC

## 2021-11-21 MED ORDER — LOSARTAN POTASSIUM 100 MG PO TABS: 100.0000 mg | ORAL_TABLET | Freq: Every day | ORAL | 3 refills | Status: DC

## 2021-11-21 MED ORDER — ALLOPURINOL 100 MG PO TABS: 100.0000 mg | ORAL_TABLET | Freq: Every day | ORAL | 3 refills | Status: DC

## 2021-11-21 MED ORDER — SPIRONOLACTONE 25 MG PO TABS: 25.0000 mg | ORAL_TABLET | Freq: Every day | ORAL | 3 refills | Status: DC

## 2021-11-24 DIAGNOSIS — R262 Difficulty in walking, not elsewhere classified: Secondary | ICD-10-CM | POA: Diagnosis not present

## 2021-11-24 DIAGNOSIS — R531 Weakness: Secondary | ICD-10-CM | POA: Diagnosis not present

## 2021-11-24 DIAGNOSIS — M545 Low back pain, unspecified: Secondary | ICD-10-CM | POA: Diagnosis not present

## 2021-12-09 ENCOUNTER — Other Ambulatory Visit: Payer: Self-pay | Admitting: Family Medicine

## 2021-12-09 DIAGNOSIS — M1A39X0 Chronic gout due to renal impairment, multiple sites, without tophus (tophi): Secondary | ICD-10-CM

## 2021-12-10 NOTE — Telephone Encounter (Signed)
Called patient to verify Allopurinol prescription. Patient taking twice a week '100mg'$ . Reports having taken that for long period of time. States she does not need a refill currently. Will follow-up at her next appointment.

## 2021-12-13 DIAGNOSIS — E1165 Type 2 diabetes mellitus with hyperglycemia: Secondary | ICD-10-CM | POA: Diagnosis not present

## 2021-12-23 DIAGNOSIS — R262 Difficulty in walking, not elsewhere classified: Secondary | ICD-10-CM | POA: Diagnosis not present

## 2021-12-23 DIAGNOSIS — R531 Weakness: Secondary | ICD-10-CM | POA: Diagnosis not present

## 2021-12-23 DIAGNOSIS — M545 Low back pain, unspecified: Secondary | ICD-10-CM | POA: Diagnosis not present

## 2021-12-29 DIAGNOSIS — D631 Anemia in chronic kidney disease: Secondary | ICD-10-CM | POA: Diagnosis not present

## 2021-12-29 DIAGNOSIS — N2581 Secondary hyperparathyroidism of renal origin: Secondary | ICD-10-CM | POA: Diagnosis not present

## 2021-12-29 DIAGNOSIS — I129 Hypertensive chronic kidney disease with stage 1 through stage 4 chronic kidney disease, or unspecified chronic kidney disease: Secondary | ICD-10-CM | POA: Diagnosis not present

## 2021-12-29 DIAGNOSIS — N189 Chronic kidney disease, unspecified: Secondary | ICD-10-CM | POA: Diagnosis not present

## 2021-12-29 DIAGNOSIS — N184 Chronic kidney disease, stage 4 (severe): Secondary | ICD-10-CM | POA: Diagnosis not present

## 2022-01-01 DIAGNOSIS — R531 Weakness: Secondary | ICD-10-CM | POA: Diagnosis not present

## 2022-01-01 DIAGNOSIS — M545 Low back pain, unspecified: Secondary | ICD-10-CM | POA: Diagnosis not present

## 2022-01-01 DIAGNOSIS — R262 Difficulty in walking, not elsewhere classified: Secondary | ICD-10-CM | POA: Diagnosis not present

## 2022-01-15 DIAGNOSIS — E1165 Type 2 diabetes mellitus with hyperglycemia: Secondary | ICD-10-CM | POA: Diagnosis not present

## 2022-01-20 DIAGNOSIS — R3121 Asymptomatic microscopic hematuria: Secondary | ICD-10-CM | POA: Diagnosis not present

## 2022-01-20 DIAGNOSIS — R3915 Urgency of urination: Secondary | ICD-10-CM | POA: Diagnosis not present

## 2022-01-20 DIAGNOSIS — R350 Frequency of micturition: Secondary | ICD-10-CM | POA: Diagnosis not present

## 2022-01-26 DIAGNOSIS — E113293 Type 2 diabetes mellitus with mild nonproliferative diabetic retinopathy without macular edema, bilateral: Secondary | ICD-10-CM | POA: Diagnosis not present

## 2022-01-30 DIAGNOSIS — E113313 Type 2 diabetes mellitus with moderate nonproliferative diabetic retinopathy with macular edema, bilateral: Secondary | ICD-10-CM | POA: Diagnosis not present

## 2022-01-30 DIAGNOSIS — H43823 Vitreomacular adhesion, bilateral: Secondary | ICD-10-CM | POA: Diagnosis not present

## 2022-01-30 DIAGNOSIS — H35033 Hypertensive retinopathy, bilateral: Secondary | ICD-10-CM | POA: Diagnosis not present

## 2022-01-30 DIAGNOSIS — H43813 Vitreous degeneration, bilateral: Secondary | ICD-10-CM | POA: Diagnosis not present

## 2022-02-14 DIAGNOSIS — E1165 Type 2 diabetes mellitus with hyperglycemia: Secondary | ICD-10-CM | POA: Diagnosis not present

## 2022-02-19 DIAGNOSIS — M545 Low back pain, unspecified: Secondary | ICD-10-CM | POA: Diagnosis not present

## 2022-02-19 DIAGNOSIS — R531 Weakness: Secondary | ICD-10-CM | POA: Diagnosis not present

## 2022-02-19 DIAGNOSIS — R262 Difficulty in walking, not elsewhere classified: Secondary | ICD-10-CM | POA: Diagnosis not present

## 2022-02-24 DIAGNOSIS — R531 Weakness: Secondary | ICD-10-CM | POA: Diagnosis not present

## 2022-02-24 DIAGNOSIS — R262 Difficulty in walking, not elsewhere classified: Secondary | ICD-10-CM | POA: Diagnosis not present

## 2022-02-24 DIAGNOSIS — M545 Low back pain, unspecified: Secondary | ICD-10-CM | POA: Diagnosis not present

## 2022-02-26 DIAGNOSIS — N2581 Secondary hyperparathyroidism of renal origin: Secondary | ICD-10-CM | POA: Diagnosis not present

## 2022-02-26 DIAGNOSIS — I120 Hypertensive chronic kidney disease with stage 5 chronic kidney disease or end stage renal disease: Secondary | ICD-10-CM | POA: Diagnosis not present

## 2022-02-26 DIAGNOSIS — N189 Chronic kidney disease, unspecified: Secondary | ICD-10-CM | POA: Diagnosis not present

## 2022-02-26 DIAGNOSIS — N185 Chronic kidney disease, stage 5: Secondary | ICD-10-CM | POA: Diagnosis not present

## 2022-02-26 DIAGNOSIS — D631 Anemia in chronic kidney disease: Secondary | ICD-10-CM | POA: Diagnosis not present

## 2022-03-06 DIAGNOSIS — R262 Difficulty in walking, not elsewhere classified: Secondary | ICD-10-CM | POA: Diagnosis not present

## 2022-03-06 DIAGNOSIS — M545 Low back pain, unspecified: Secondary | ICD-10-CM | POA: Diagnosis not present

## 2022-03-06 DIAGNOSIS — R531 Weakness: Secondary | ICD-10-CM | POA: Diagnosis not present

## 2022-03-16 ENCOUNTER — Other Ambulatory Visit: Payer: Self-pay

## 2022-03-16 DIAGNOSIS — M1A39X0 Chronic gout due to renal impairment, multiple sites, without tophus (tophi): Secondary | ICD-10-CM

## 2022-03-19 ENCOUNTER — Ambulatory Visit (INDEPENDENT_AMBULATORY_CARE_PROVIDER_SITE_OTHER): Payer: Medicare Other | Admitting: Family Medicine

## 2022-03-19 ENCOUNTER — Encounter: Payer: Self-pay | Admitting: Family Medicine

## 2022-03-19 VITALS — BP 124/62 | HR 76 | Wt 203.0 lb

## 2022-03-19 DIAGNOSIS — E1159 Type 2 diabetes mellitus with other circulatory complications: Secondary | ICD-10-CM | POA: Diagnosis not present

## 2022-03-19 DIAGNOSIS — R634 Abnormal weight loss: Secondary | ICD-10-CM | POA: Diagnosis not present

## 2022-03-19 DIAGNOSIS — E1140 Type 2 diabetes mellitus with diabetic neuropathy, unspecified: Secondary | ICD-10-CM

## 2022-03-19 DIAGNOSIS — Z23 Encounter for immunization: Secondary | ICD-10-CM | POA: Diagnosis not present

## 2022-03-19 DIAGNOSIS — N184 Chronic kidney disease, stage 4 (severe): Secondary | ICD-10-CM

## 2022-03-19 DIAGNOSIS — I152 Hypertension secondary to endocrine disorders: Secondary | ICD-10-CM

## 2022-03-19 DIAGNOSIS — E1165 Type 2 diabetes mellitus with hyperglycemia: Secondary | ICD-10-CM | POA: Diagnosis not present

## 2022-03-19 DIAGNOSIS — Z794 Long term (current) use of insulin: Secondary | ICD-10-CM

## 2022-03-19 LAB — POCT GLYCOSYLATED HEMOGLOBIN (HGB A1C): HbA1c, POC (controlled diabetic range): 5.4 % (ref 0.0–7.0)

## 2022-03-19 MED ORDER — LANTUS SOLOSTAR 100 UNIT/ML ~~LOC~~ SOPN: 5.0000 [IU] | PEN_INJECTOR | Freq: Every day | SUBCUTANEOUS | 3 refills | Status: DC

## 2022-03-19 NOTE — Patient Instructions (Addendum)
It was great to see you! Thank you for allowing me to participate in your care!  I recommend that you always bring your medications to each appointment as this makes it easy to ensure we are on the correct medications and helps [place] not miss when refills are needed.  Our plans for today:  -We are not changing her medications today.  Please continue taking blood pressure medications as prescribed. -I am reducing your Lantus to 5 units daily. -I will call you if any of your blood work is abnormal.  We are checking some labs today, I will call you if they are abnormal will send you a MyChart message or a letter if they are normal.  If you do not hear about your labs in the next 2 weeks please let us know.  Take care and seek immediate care sooner if you develop any concerns.   Dr. Salvadore Oxford, MD [place]

## 2022-03-19 NOTE — Assessment & Plan Note (Addendum)
Patient is lost 30 pounds since last visit in March.  States is unintentional, she is on Ozempic started 2 years ago.  Endorses decreased appetite, likely due to chronic kidney disease.  Discussed adding daily Ensure at meals.  States she will get over-the-counter.  Colonoscopy normal 2019.

## 2022-03-19 NOTE — Assessment & Plan Note (Signed)
Per patient she is now stage V.  Still urinating regularly.  Managed by [facility] last seen on October 13. -Will request records from [place] today to look at potassium, per outside med rec patient is possibly on Lasix which would help with this, over she did not endorse this today and therefore we will check. -Asked patient to bring in medications at next visit.

## 2022-03-19 NOTE — Assessment & Plan Note (Signed)
A1c today is 5.4, controlled.  Decrease Lantus to 5 units daily.

## 2022-03-19 NOTE — Assessment & Plan Note (Addendum)
Patient states she is currently on spironolactone 25 mg daily, losartan 100 mg daily, and Coreg 25 mg daily.  Her blood pressure is controlled today in clinic, but she is not taking at home.  Discussed checking at home and creating a record and bring in medications at next visit to confirm what regimen she is on. -BMP today -I will back check with records from [facility]

## 2022-03-19 NOTE — Progress Notes (Signed)
    SUBJECTIVE:   CHIEF COMPLAINT / HPI: follow-up  Unintentional weight loss - Eating less for about a year. Gets full quickly. Sometimes not hungry. No blood in stool. No fevers, sometimes has chills.  Continue Ozempic 1 mg weekly.  HTN - Patient states she is taking spironolactone, Losartan, Not taking Hydralazine, and Carvedilol taking once per day.  Outside medicine reconciliation unclear as Lasix and nifedipine are listed.  DM2 - Last A1c - 5.6, Lantus 10u taking.  Continue Ozempic 1 mg weekly.  CKD5 - Saw [facility] on Oct 12. Did "bloodwork". Has appointment for dialysis class tomorrow. Urinates often.  Patient does not wish any dialysis, but is willing to do the class to find out more information.    PERTINENT  PMH / PSH: Stage 5 CKD, HTN, Type 2 Diabetes  OBJECTIVE:   BP 124/62   Pulse 76   Wt 203 lb (92.1 kg)   SpO2 99%   BMI 35.96 kg/m   General: NAD  Cardiovascular: RRR, no murmurs, no peripheral edema Respiratory: normal WOB on RA, CTAB, no wheezes, ronchi or rales Extremities: Moving all 4 extremities equally   ASSESSMENT/PLAN:   CKD (chronic kidney disease), stage IV (HCC) Per patient she is now stage V.  Still urinating regularly.  Managed by [facility] last seen on October 13. -Will request records from [place] today to look at potassium, per outside med rec patient is possibly on Lasix which would help with this, over she did not endorse this today and therefore we will check. -Asked patient to bring in medications at next visit.  Hypertension associated with diabetes [facility]) Patient states she is currently on spironolactone 25 mg daily, losartan 100 mg daily, and Coreg 25 mg daily.  Her blood pressure is controlled today in clinic, but she is not taking at home.  Discussed checking at home and creating a record and bring in medications at next visit to confirm what regimen she is on. -BMP today -I will back check with  records from [facility]  Type 2 diabetes mellitus with diabetic neuropathy, with long-term current use of insulin ([place]) A1c today is 5.4, controlled.  Decrease Lantus to 5 units daily.  Weight loss, non-intentional Patient is lost 30 pounds since last visit in March.  States is unintentional, she is on Ozempic started 2 years ago.  Endorses decreased appetite, likely due to chronic kidney disease.  Discussed adding daily Ensure at meals.  States she will get over-the-counter.     Salvadore Oxford, MD [place]

## 2022-03-20 LAB — BASIC METABOLIC PANEL
BUN/Creatinine Ratio: 13 (ref 12–28)
BUN: 54 mg/dL — ABNORMAL HIGH (ref 8–27)
CO2: 20 mmol/L (ref 20–29)
Calcium: 9.1 mg/dL (ref 8.7–10.3)
Chloride: 108 mmol/L — ABNORMAL HIGH (ref 96–106)
Creatinine, Ser: 4.03 mg/dL — ABNORMAL HIGH (ref 0.57–1.00)
Glucose: 78 mg/dL (ref 70–99)
Potassium: 4.3 mmol/L (ref 3.5–5.2)
Sodium: 144 mmol/L (ref 134–144)
eGFR: 11 mL/min/{1.73_m2} — ABNORMAL LOW (ref >59)

## 2022-03-31 ENCOUNTER — Encounter: Payer: Self-pay | Admitting: Family Medicine

## 2022-04-05 ENCOUNTER — Ambulatory Visit (HOSPITAL_COMMUNITY)
Admission: EM | Admit: 2022-04-05 | Discharge: 2022-04-05 | Disposition: A | Discharge status: Home / Self Care | Payer: Medicare Other | Attending: Internal Medicine | Admitting: Internal Medicine

## 2022-04-05 ENCOUNTER — Encounter (HOSPITAL_COMMUNITY): Payer: Self-pay

## 2022-04-05 DIAGNOSIS — R8290 Unspecified abnormal findings in urine: Secondary | ICD-10-CM | POA: Diagnosis not present

## 2022-04-05 LAB — POCT URINALYSIS DIPSTICK, ED / UC
Bilirubin Urine: NEGATIVE
Glucose, UA: NEGATIVE mg/dL
Hgb urine dipstick: NEGATIVE
Ketones, ur: NEGATIVE mg/dL
Nitrite: NEGATIVE
Protein, ur: >=300 mg/dL — AB
Specific Gravity, Urine: 1.02 (ref 1.005–1.030)
Urobilinogen, UA: 0.2 mg/dL (ref 0.0–1.0)
pH: 7 (ref 5.0–8.0)

## 2022-04-05 NOTE — Discharge Instructions (Signed)
Your urine had a few white blood cells but was otherwise normal.  Given you are not having UTI symptoms (burning, frequency, urgency) we are going to send this for culture but wait to start antibiotics.  This should take a few days and we will contact you if we need to start any antibiotics.  We also collected a swab that looks for bacterial overgrowth in the vagina.  If we need to arrange treatment based on these results we will contact you.  Make sure that you rest and drink plenty of fluid.  Use hypoallergenic soaps and detergents.  Follow-up with your primary care next week.  If anything worsens and you have severe abdominal pain, flank pain, fever, nausea, vomiting you should be seen immediately.

## 2022-04-05 NOTE — ED Triage Notes (Signed)
Pt is here for a possible UTI xfew days . Pt also has a odor when urinating

## 2022-04-05 NOTE — ED Provider Notes (Signed)
[facility]    CSN: [id number] Arrival date & time: 04/05/22  1412      History   Chief Complaint Chief Complaint  Patient presents with   Dysuria    HPI Michelle Abbott is a 76 y.o. female.   Patient presents today with a several day history of malodorous urine.  She denies any urinary frequency, urgency, dysuria.  She reports that the odor is significant but only present when she goes to the bathroom.  She denies any vaginal discharge and does not believe that her is coming from her vagina.  She denies any changes to personal hygiene products including soaps or detergents.  Denies any recent antibiotics.  She denies any fever, abdominal pain, nausea, vomiting.  Denies history of nephrolithiasis.  Last UTI was approximately 8 months ago she reports resolution of symptoms following antibiotics.  She has not seen a urologist.  Denies any recent urogenital procedure or self-catheterization.  She does have a history of diabetes but does not take SGLT2 inhibitor.  Blood sugars are well controlled with A1c of 5.4% 2 weeks ago.    Past Medical History:  Diagnosis Date   Acute kidney injury superimposed on chronic kidney disease ([place]) 04/28/2020   Anemia    Arthritis    Chronic kidney disease    Stage 4   Diabetes mellitus without complication ([place])    type 2   Gout 2017   Hyperlipidemia    Hypertension    Hypertensive nephropathy 05/09/2021   Nephrotic range proteinuria 05/09/2021   Postoperative pain 04/29/2020   Rectal bleeding, History of 12/28/2019   Sciatica    Secondary hyperparathyroidism of renal origin ([place]) 05/09/2021   SI (sacroiliac) joint inflammation ([place]) 03/12/2019   UTI (urinary tract infection) 02/02/2019   Vitamin D deficiency 2014    Patient Active Problem List   Diagnosis Date Noted   Weight loss, non-intentional 03/19/2022   Bacterial infection due to Pseudomonas 07/11/2021   Obesity, Class III, BMI 40-49.9 (morbid obesity) ([place]) 06/20/2021    Secondary hyperparathyroidism of renal origin ([place]) 05/09/2021   Hypertensive nephropathy 05/09/2021   Nephrotic range proteinuria 05/09/2021   Leg swelling 09/24/2020   Incontinence 09/24/2020   Anemia secondary to renal failure 04/29/2020   Spondylolisthesis at L4-L5 level 04/26/2020   Age-related hearing loss 03/12/2019   Sciatica 11/17/2016   Echocardiogram shows left ventricular diastolic dysfunction 09/98/3382   Chronic kidney disease, stage 5 ([place]) 07/21/2016   Type 2 diabetes mellitus with diabetic neuropathy, with long-term current use of insulin ([place]) 06/15/2016   Gout 06/15/2016   Hypertension associated with diabetes ([place]) 06/15/2016   Osteoarthritis 06/15/2016   Hyperlipidemia associated with type 2 diabetes mellitus ([place]) 06/15/2016    Past Surgical History:  Procedure Laterality Date   BREAST SURGERY Right 1961   Benign lump removed   CATARACT EXTRACTION, BILATERAL Bilateral 2017   [place] OF UTERUS  2017   KNEE ARTHROSCOPY Right    VAGINAL SEPTOPLASTY  1966    OB History   No obstetric history on file.      Home Medications    Prior to Admission medications   Medication Sig Start Date End Date Taking? Authorizing Provider  ACCU-CHEK FASTCLIX LANCETS MISC TEST BLOOD SUGAR FOUR TIMES DAILY 08/24/17   Steve Rattler, DO  acetaminophen (TYLENOL 8 HOUR) 650 MG CR tablet Take 1  tablet (650 mg total) by mouth every 8 (eight) hours as needed for pain. 05/02/20   Sharion Settler, DO  allopurinol (ZYLOPRIM) 100 MG tablet Take 1 tablet (100 mg total) by mouth daily. Patient taking differently: Take 100 mg by mouth daily. Taking twice per week 11/21/21   Salvadore Oxford, MD  atorvastatin (LIPITOR) 40 MG tablet Take 1 tablet (40 mg total) by mouth daily. 11/21/21   Salvadore Oxford, MD  B-D ULTRAFINE III SHORT PEN 31G X 8 MM MISC Use to give insulin twice a  day and victoza once a day. Dx code = E11.9 09/24/21   Gladys Damme, MD  Blood Glucose Monitoring Suppl [facility] VERIO) w/Device KIT Use to check blood sugars 3x daily. E11.9 05/21/20   Gladys Damme, MD  carvedilol (COREG) 25 MG tablet Take 25 mg by mouth 2 (two) times daily with a meal.    [provider]  cholecalciferol (VITAMIN D) 1000 units tablet Take 1,000 Units by mouth daily.    [provider]  colchicine 0.6 MG tablet Take 2 tablets (1.2 mg total) at onset of gout flare then take 1 tablet (0.6 mg) one hour later if symptoms persist. (MAX: 1.8 mg total daily) Patient taking differently: Take 0.6-1.2 mg by mouth See admin instructions. Take 2 tablets (1.2 mg total) at onset of gout flare then take 1 tablet (0.6 mg) one hour later if symptoms persist. (MAX: 1.8 mg total daily) 04/12/19   Martyn Malay, MD  ferrous sulfate 325 (65 FE) MG tablet Take 325 mg by mouth daily. 06/12/16   [provider]  hydrALAZINE (APRESOLINE) 50 MG tablet Take 1 tablet (50 mg total) by mouth 2 (two) times daily. Patient not taking: Reported on 03/19/2022 11/21/21   Salvadore Oxford, MD  LANTUS SOLOSTAR 100 UNIT/ML Solostar Pen Inject 5 Units into the skin daily. Increase as instructed. Max dose 50 units. 03/19/22   Salvadore Oxford, MD  losartan (COZAAR) 100 MG tablet Take 1 tablet (100 mg total) by mouth daily. 11/21/21   Salvadore Oxford, MD  Multiple Vitamin (MULTIVITAMIN WITH MINERALS) TABS tablet Take 1 tablet by mouth daily.    [provider]  [facility] ULTRA test strip USE TO TEST BLOOD SUGARS 3X DAILY. E11.9 08/28/21   Gladys Damme, MD  oxybutynin (DITROPAN-XL) 5 MG 24 hr tablet Take 5 mg by mouth daily. 01/24/21   [provider]  polyethylene glycol powder (GLYCOLAX/MIRALAX) powder Take 17 g by mouth 2 (two) times daily as needed. Patient taking differently: Take 17 g by mouth 2 (two) times daily as needed for moderate constipation. 12/28/16   Steve Rattler, DO  Semaglutide, 1 MG/DOSE, (OZEMPIC, 1 MG/DOSE,) 4 MG/3ML SOPN INJECT SUBCUTANEOUSLY 1 MG EVERY WEEK 11/21/21   Salvadore Oxford, MD  spironolactone (ALDACTONE) 25 MG tablet Take 1 tablet (25 mg total) by mouth daily. 11/21/21   Salvadore Oxford, MD  vitamin B-12 (CYANOCOBALAMIN) 1000 MCG tablet Take 1,000 mcg by mouth daily.     [provider]    Family History Family History  Problem Relation Age of Onset   Cancer Mother        ovarian and pancreatic   Diabetes Mother    Stroke Mother    COPD Father    Arthritis Father    Cancer Brother        Lung   COPD Brother     Social History Social History   Tobacco Use   Smoking status: Former    Packs/day:  1.50    Years: 30.00    Total pack years: 45.00    Types: Cigarettes    Quit date: 05/18/1997    Years since quitting: 24.8    Passive exposure: Past   Smokeless tobacco: Never   Tobacco comments:    1.5 - 2 ppd for 30 years:   no plans to re-start  Vaping Use   Vaping Use: Never used  Substance Use Topics   Alcohol use: No   Drug use: No     Allergies   Ciprofloxacin hcl, Metformin and related, Ace inhibitors, Amlodipine, Baclofen, Ciprofloxacin, Feraheme [ferumoxytol], Metformin, and Lisinopril   Review of Systems Review of Systems  Constitutional:  Negative for activity change, appetite change, fatigue and fever.  Gastrointestinal:  Negative for abdominal pain, diarrhea, nausea and vomiting.  Genitourinary:  Negative for dysuria, frequency, urgency, vaginal bleeding, vaginal discharge and vaginal pain.     Physical Exam Triage Vital Signs ED Triage Vitals [04/05/22 1527]  Enc Vitals Group     BP (!) 162/85     Pulse Rate 70     Resp 12     Temp 98.3 F (36.8 C)     Temp Source Oral     SpO2 98 %     Weight      Height      Head Circumference      Peak Flow      Pain Score 7     Pain Loc      Pain Edu?      Excl. in [place]?    No data found.  Updated Vital Signs BP (!) 162/85 (BP  Location: Right Arm)   Pulse 70   Temp 98.3 F (36.8 C) (Oral)   Resp 12   SpO2 98%   Visual Acuity Right Eye Distance:   Left Eye Distance:   Bilateral Distance:    Right Eye Near:   Left Eye Near:    Bilateral Near:     Physical Exam Vitals reviewed.  Constitutional:      General: She is awake. She is not in acute distress.    Appearance: Normal appearance. She is well-developed. She is not ill-appearing.     Comments: Very pleasant female appears stated age in no acute distress sitting comfortably in exam room  HENT:     Head: Normocephalic and atraumatic.  Cardiovascular:     Rate and Rhythm: Normal rate and regular rhythm.     Heart sounds: Normal heart sounds, S1 normal and S2 normal. No murmur heard. Pulmonary:     Effort: Pulmonary effort is normal.     Breath sounds: Normal breath sounds. No wheezing, rhonchi or rales.     Comments: Clear to auscultation bilaterally Abdominal:     General: Bowel sounds are normal.     Palpations: Abdomen is soft.     Tenderness: There is no abdominal tenderness. There is no right CVA tenderness, left CVA tenderness, guarding or rebound.     Comments: Benign abdominal exam  Psychiatric:        Behavior: Behavior is cooperative.      UC Treatments / Results  Labs (all labs ordered are listed, but only abnormal results are displayed) Labs Reviewed  POCT URINALYSIS DIPSTICK, ED / UC - Abnormal; Notable for the following components:      Result Value   Protein, ur >=300 (*)    Leukocytes,Ua SMALL (*)    All other components within normal limits  URINE CULTURE  CERVICOVAGINAL ANCILLARY ONLY    EKG   Radiology No results found.  Procedures Procedures (including critical care time)  Medications Ordered in UC Medications - No data to display  Initial Impression / Assessment and Plan / UC Course  I have reviewed the triage vital signs and the nursing notes.  Pertinent labs & imaging results that were available  during my care of the patient were reviewed by me and considered in my medical decision making (see chart for details).     Patient is well-appearing, afebrile, nontoxic, nontachycardic.  UA showed small leukocyte esterase and proteinuria that is at baseline.  Vaginal swab was collected today to look for bacterial vaginosis or yeast.  These results are pending.  Given she is not having typical UTI symptoms will defer antibiotics until culture results are available.  She will need renal adjustment of her antibiotics given history of CKD and calculated creatinine clearance of 17.8 mL/min based on BMP from 03/19/2022.  She was encouraged to rest and drink plenty fluid.  Recommended hypoallergenic soaps and detergents.  Discussed that if develops any additional symptoms she should return for reevaluation.  If anything worsens and she has high fever, weakness, abdominal pain, nausea/vomiting, hematuria, flank pain she needs to go to the emergency room.  Recommended close follow-up with primary care.  Final Clinical Impressions(s) / UC Diagnoses   Final diagnoses:  Malodorous urine     Discharge Instructions      Your urine had a few white blood cells but was otherwise normal.  Given you are not having UTI symptoms (burning, frequency, urgency) we are going to send this for culture but wait to start antibiotics.  This should take a few days and we will contact you if we need to start any antibiotics.  We also collected a swab that looks for bacterial overgrowth in the vagina.  If we need to arrange treatment based on these results we will contact you.  Make sure that you rest and drink plenty of fluid.  Use hypoallergenic soaps and detergents.  Follow-up with your primary care next week.  If anything worsens and you have severe abdominal pain, flank pain, fever, nausea, vomiting you should be seen immediately.     ED Prescriptions   None    PDMP not reviewed this encounter.   Terrilee Croak,  PA-C 04/05/22 1556

## 2022-04-06 LAB — CERVICOVAGINAL ANCILLARY ONLY
Bacterial Vaginitis (gardnerella): POSITIVE — AB
Candida Glabrata: NEGATIVE
Candida Vaginitis: NEGATIVE
Comment: NEGATIVE
Comment: NEGATIVE
Comment: NEGATIVE

## 2022-04-06 LAB — URINE CULTURE: Culture: >=100000 — AB

## 2022-04-07 ENCOUNTER — Telehealth (HOSPITAL_COMMUNITY): Payer: Self-pay | Admitting: Emergency Medicine

## 2022-04-07 MED ORDER — AMOXICILLIN-POT CLAVULANATE 875-125 MG PO TABS: 1.0000 | ORAL_TABLET | Freq: Two times a day (BID) | ORAL | 0 refills | Status: AC

## 2022-04-07 MED ORDER — METRONIDAZOLE 500 MG PO TABS: 500.0000 mg | ORAL_TABLET | Freq: Two times a day (BID) | ORAL | 0 refills | Status: DC

## 2022-04-24 ENCOUNTER — Ambulatory Visit: Payer: Self-pay | Admitting: Family Medicine

## 2022-04-28 NOTE — Progress Notes (Signed)
[facility] Quality Team Note  Name: Michelle Abbott Date of Birth: [birth year] MRN: [id number] Date: 04/28/2022  [facility] Quality Team has reviewed this patient's chart, please see recommendations below:  [facility] Quality Other; (KED: Kidney Health Evaluation Gap- Patient needs Urine Albumin Creatinine Ratio Test completed for gap closure. EGFR has already been completed, Patient has upcoming appointment with [facility] 05/07/2022.)

## 2022-04-29 DIAGNOSIS — I120 Hypertensive chronic kidney disease with stage 5 chronic kidney disease or end stage renal disease: Secondary | ICD-10-CM | POA: Diagnosis not present

## 2022-04-29 DIAGNOSIS — N2581 Secondary hyperparathyroidism of renal origin: Secondary | ICD-10-CM | POA: Diagnosis not present

## 2022-04-29 DIAGNOSIS — N189 Chronic kidney disease, unspecified: Secondary | ICD-10-CM | POA: Diagnosis not present

## 2022-04-29 DIAGNOSIS — N185 Chronic kidney disease, stage 5: Secondary | ICD-10-CM | POA: Diagnosis not present

## 2022-04-29 DIAGNOSIS — D631 Anemia in chronic kidney disease: Secondary | ICD-10-CM | POA: Diagnosis not present

## 2022-05-04 DIAGNOSIS — E1151 Type 2 diabetes mellitus with diabetic peripheral angiopathy without gangrene: Secondary | ICD-10-CM | POA: Diagnosis not present

## 2022-05-04 DIAGNOSIS — L603 Nail dystrophy: Secondary | ICD-10-CM | POA: Diagnosis not present

## 2022-05-04 DIAGNOSIS — I739 Peripheral vascular disease, unspecified: Secondary | ICD-10-CM | POA: Diagnosis not present

## 2022-05-04 DIAGNOSIS — E1142 Type 2 diabetes mellitus with diabetic polyneuropathy: Secondary | ICD-10-CM | POA: Diagnosis not present

## 2022-05-05 NOTE — Progress Notes (Unsigned)
    SUBJECTIVE:   CHIEF COMPLAINT / HPI: health maintenance follow-up  DM2 - Would like to stop Lantus 5 units as her A1c has been controlled around 5 the past couple times.  Discussed patient's medications with her as Lasix was noted in her last note from [facility].  States she is taking this added to patient's chart.  Is taking Lasix.   DTAP and Zoster - Discussed getting Shingrix at [facility] or the health department.  And tetanus vaccine should she be able to afford it as it would not be covered standardly by Medicare.  Mammogram -patient willing to get mammogram again as it is been 2 years and she does require screening.  Does not wish to do urine creatinine testing today.  PERTINENT  PMH / PSH: ESRD stage 5, HTN  OBJECTIVE:   BP (!) 142/76   Pulse 88   Ht '5\' 3"'$  (1.6 m)   Wt 198 lb 6.4 oz (90 kg)   SpO2 96%   BMI 35.14 kg/m   General: NAD  Neuro: A&O Cardiovascular: RRR, no murmurs, no peripheral edema Respiratory: normal WOB on RA, CTAB, no wheezes, ronchi or rales Extremities: Moving all 4 extremities equally   Filed Weights   05/07/22 1350  Weight: 198 lb 6.4 oz (90 kg)    K+ 4.3 03/19/22  ASSESSMENT/PLAN:   Type 2 diabetes mellitus with diabetic neuropathy, with long-term current use of insulin ([place]) Further shared decision making with patient regarding Lantus.  Agreed to stop Lantus 5 units at this time as her A1c is controlled. -Continue Ozempic  Hypertension associated with diabetes ([place]) Reconfirmed patient's blood pressure medications today.  Patient is taking carvedilol, losartan, spironolactone and furosemide.   Chronic kidney disease, stage 5 ([place]) Per last [facility] note recorded in media continuing on Urso losartan is okay at this time despite creatinine clearance that she has not had abnormal electrolyte abnormalities at this time.  This will likely change in the future, and plan to monitor her electrolytes closely. -Directed  patient to have [facility] send the recent progress notes to our clinic as they have been sent before -Patient has been referred for dialysis access     Salvadore Oxford, MD [place]

## 2022-05-07 ENCOUNTER — Ambulatory Visit (INDEPENDENT_AMBULATORY_CARE_PROVIDER_SITE_OTHER): Payer: Medicare Other | Admitting: Family Medicine

## 2022-05-07 ENCOUNTER — Encounter: Payer: Self-pay | Admitting: Family Medicine

## 2022-05-07 VITALS — BP 142/76 | HR 88 | Ht 63.0 in | Wt 198.4 lb

## 2022-05-07 DIAGNOSIS — E1159 Type 2 diabetes mellitus with other circulatory complications: Secondary | ICD-10-CM | POA: Diagnosis not present

## 2022-05-07 DIAGNOSIS — Z794 Long term (current) use of insulin: Secondary | ICD-10-CM

## 2022-05-07 DIAGNOSIS — N185 Chronic kidney disease, stage 5: Secondary | ICD-10-CM

## 2022-05-07 DIAGNOSIS — Z1231 Encounter for screening mammogram for malignant neoplasm of breast: Secondary | ICD-10-CM

## 2022-05-07 DIAGNOSIS — E1140 Type 2 diabetes mellitus with diabetic neuropathy, unspecified: Secondary | ICD-10-CM | POA: Diagnosis not present

## 2022-05-07 DIAGNOSIS — I152 Hypertension secondary to endocrine disorders: Secondary | ICD-10-CM | POA: Diagnosis not present

## 2022-05-07 NOTE — Assessment & Plan Note (Signed)
Per last [facility] note recorded in media continuing on Urso losartan is okay at this time despite creatinine clearance that she has not had abnormal electrolyte abnormalities at this time.  This will likely change in the future, and plan to monitor her electrolytes closely. -Directed patient to have [facility] send the recent progress notes to our clinic as they have been sent before -Patient has been referred for dialysis access

## 2022-05-07 NOTE — Assessment & Plan Note (Signed)
Reconfirmed patient's blood pressure medications today.  Patient is taking carvedilol, losartan, spironolactone and furosemide.

## 2022-05-07 NOTE — Patient Instructions (Signed)
It was great to see you! Thank you for allowing me to participate in your care!  I recommend that you always bring your medications to each appointment as this makes it easy to ensure we are on the correct medications and helps [place] not miss when refills are needed.  Our plans for today:  -Please stop taking 5 units of Lantus. -Please have your kidney doctors send your records over. -I have sent a referral for another mammogram.  Take care and seek immediate care sooner if you develop any concerns.   Dr. Salvadore Oxford, MD [place]

## 2022-05-07 NOTE — Assessment & Plan Note (Signed)
Further shared decision making with patient regarding Lantus.  Agreed to stop Lantus 5 units at this time as her A1c is controlled. -Continue Ozempic

## 2022-05-15 DIAGNOSIS — E1165 Type 2 diabetes mellitus with hyperglycemia: Secondary | ICD-10-CM | POA: Diagnosis not present

## 2022-05-21 ENCOUNTER — Telehealth: Payer: Self-pay | Admitting: *Deleted

## 2022-05-21 NOTE — Telephone Encounter (Signed)
Patient left message on referral line that she would like to be referred to [facility] PT.  Will forward to MD.  Michelle Abbott

## 2022-05-23 ENCOUNTER — Other Ambulatory Visit: Payer: Self-pay | Admitting: Family Medicine

## 2022-05-23 DIAGNOSIS — M170 Bilateral primary osteoarthritis of knee: Secondary | ICD-10-CM

## 2022-05-23 DIAGNOSIS — M4316 Spondylolisthesis, lumbar region: Secondary | ICD-10-CM

## 2022-05-23 NOTE — Progress Notes (Signed)
Patient requesting referral to physical therapy. Multiple co morbidities that would benefit from strengthening and fitness.

## 2022-06-04 ENCOUNTER — Emergency Department (HOSPITAL_COMMUNITY)
Admission: EM | Admit: 2022-06-04 | Discharge: 2022-06-05 | Disposition: A | Discharge status: Home / Self Care | Payer: 59 | Attending: Emergency Medicine | Admitting: Emergency Medicine

## 2022-06-04 ENCOUNTER — Emergency Department (HOSPITAL_COMMUNITY): Payer: 59

## 2022-06-04 ENCOUNTER — Other Ambulatory Visit: Payer: Self-pay

## 2022-06-04 ENCOUNTER — Encounter (HOSPITAL_COMMUNITY): Payer: Self-pay

## 2022-06-04 DIAGNOSIS — I10 Essential (primary) hypertension: Secondary | ICD-10-CM | POA: Diagnosis not present

## 2022-06-04 DIAGNOSIS — I129 Hypertensive chronic kidney disease with stage 1 through stage 4 chronic kidney disease, or unspecified chronic kidney disease: Secondary | ICD-10-CM | POA: Diagnosis not present

## 2022-06-04 DIAGNOSIS — R350 Frequency of micturition: Secondary | ICD-10-CM | POA: Diagnosis present

## 2022-06-04 DIAGNOSIS — N390 Urinary tract infection, site not specified: Secondary | ICD-10-CM | POA: Diagnosis not present

## 2022-06-04 DIAGNOSIS — Z0389 Encounter for observation for other suspected diseases and conditions ruled out: Secondary | ICD-10-CM | POA: Diagnosis not present

## 2022-06-04 LAB — CBC WITH DIFFERENTIAL/PLATELET
Abs Immature Granulocytes: 0.02 10*3/uL (ref 0.00–0.07)
Basophils Absolute: 0 10*3/uL (ref 0.0–0.1)
Basophils Relative: 1 %
Eosinophils Absolute: 0.2 10*3/uL (ref 0.0–0.5)
Eosinophils Relative: 4 %
HCT: 33.4 % — ABNORMAL LOW (ref 36.0–46.0)
Hemoglobin: 9.7 g/dL — ABNORMAL LOW (ref 12.0–15.0)
Immature Granulocytes: 0 %
Lymphocytes Relative: 25 %
Lymphs Abs: 1.5 10*3/uL (ref 0.7–4.0)
MCH: 26.6 pg (ref 26.0–34.0)
MCHC: 29 g/dL — ABNORMAL LOW (ref 30.0–36.0)
MCV: 91.5 fL (ref 80.0–100.0)
Monocytes Absolute: 0.5 10*3/uL (ref 0.1–1.0)
Monocytes Relative: 8 %
Neutro Abs: 3.9 10*3/uL (ref 1.7–7.7)
Neutrophils Relative %: 62 %
Platelets: 243 10*3/uL (ref 150–400)
RBC: 3.65 MIL/uL — ABNORMAL LOW (ref 3.87–5.11)
RDW: 15.5 % (ref 11.5–15.5)
WBC: 6.2 10*3/uL (ref 4.0–10.5)
nRBC: 0 % (ref 0.0–0.2)

## 2022-06-04 LAB — URINALYSIS, ROUTINE W REFLEX MICROSCOPIC
Bilirubin Urine: NEGATIVE
Glucose, UA: NEGATIVE mg/dL
Hgb urine dipstick: NEGATIVE
Ketones, ur: NEGATIVE mg/dL
Nitrite: NEGATIVE
Protein, ur: >=300 mg/dL — AB
Specific Gravity, Urine: 1.008 (ref 1.005–1.030)
WBC, UA: >50 WBC/hpf — ABNORMAL HIGH (ref 0–5)
pH: 5 (ref 5.0–8.0)

## 2022-06-04 MED ORDER — SODIUM CHLORIDE 0.9 % IV SOLN
2.0000 g | Freq: Once | INTRAVENOUS | Status: AC
  Administered 2022-06-04: 2 g via INTRAVENOUS
  Filled 2022-06-04: qty 20

## 2022-06-04 NOTE — ED Triage Notes (Signed)
Pt reports having UTI symptoms x 1 week with yellow discharge along with chills.

## 2022-06-04 NOTE — ED Provider Triage Note (Signed)
Emergency Medicine Provider Triage Evaluation Note  Kiffany Schelling , a 77 y.o. female  was evaluated in triage.  Pt complains of urinary frequency and urgency for the past couple of days with yellow vaginal discharge.  She states it feels like prior UTI.  She states last month she had UTI with a bacterial vaginal infection and this feels similar.  No flank pain or back pain, mild suprapubic feeling of pressure but no abdominal pain..  Review of Systems  Positive:  Negative: Fever or chills  Physical Exam  BP (!) 187/87 (BP Location: Left Arm)   Pulse 88   Temp 98.6 F (37 C) (Oral)   Resp 18   SpO2 100%  Gen:   Awake, no distress   Resp:  Normal effort  MSK:   Moves extremities without difficulty  Other:    Medical Decision Making  Medically screening exam initiated at 7:11 PM.  Appropriate orders placed.  Nashika Coker was informed that the remainder of the evaluation will be completed by another provider, this initial triage assessment does not replace that evaluation, and the importance of remaining in the ED until their evaluation is complete.     Gwenevere Abbot, [place] 06/04/22 1912

## 2022-06-04 NOTE — ED Provider Notes (Signed)
[facility] DEPT Provider Note   CSN: [id number] Arrival date & time: 06/04/22  1840     History  Chief Complaint  Patient presents with   Chills   Urinary Frequency    Michelle Abbott is a 77 y.o. female.  Patient presents to the emergency department with concerns over possible urinary tract infection.  Patient reports she has a history of recurrent UTI.  She has had several days of urinary urgency and bladder pressure with urination, now experiencing some slight pain in the lower back.  This is typical of her symptoms with UTI.  She had some chills earlier but did not document any fever.       Home Medications Prior to Admission medications   Medication Sig Start Date End Date Taking? Authorizing Provider  cephALEXin (KEFLEX) 500 MG capsule Take 1 capsule (500 mg total) by mouth 3 (three) times daily. 06/05/22  Yes Mukund Weinreb, Gwenyth Allegra, MD  fluconazole (DIFLUCAN) 150 MG tablet Take 1 tablet (150 mg total) by mouth once a week for 2 doses. 06/05/22 06/13/22 Yes Kimi Kroft, Gwenyth Allegra, MD  ACCU-CHEK FASTCLIX LANCETS MISC TEST BLOOD SUGAR FOUR TIMES DAILY 08/24/17   Steve Rattler, DO  acetaminophen (TYLENOL 8 HOUR) 650 MG CR tablet Take 1 tablet (650 mg total) by mouth every 8 (eight) hours as needed for pain. 05/02/20   Sharion Settler, DO  allopurinol (ZYLOPRIM) 100 MG tablet Take 1 tablet (100 mg total) by mouth daily. Patient taking differently: Take 100 mg by mouth daily. Taking twice per week 11/21/21   Salvadore Oxford, MD  atorvastatin (LIPITOR) 40 MG tablet Take 1 tablet (40 mg total) by mouth daily. 11/21/21   Salvadore Oxford, MD  B-D ULTRAFINE III SHORT PEN 31G X 8 MM MISC Use to give insulin twice a day and victoza once a day. Dx code = E11.9 09/24/21   Gladys Damme, MD  Blood Glucose Monitoring Suppl [facility] VERIO) w/Device KIT Use to check blood sugars 3x daily. E11.9 05/21/20   Gladys Damme, MD  carvedilol (COREG) 25 MG tablet  Take 25 mg by mouth 2 (two) times daily with a meal.    [provider]  cholecalciferol (VITAMIN D) 1000 units tablet Take 1,000 Units by mouth daily.    [provider]  colchicine 0.6 MG tablet Take 2 tablets (1.2 mg total) at onset of gout flare then take 1 tablet (0.6 mg) one hour later if symptoms persist. (MAX: 1.8 mg total daily) Patient taking differently: Take 0.6-1.2 mg by mouth See admin instructions. Take 2 tablets (1.2 mg total) at onset of gout flare then take 1 tablet (0.6 mg) one hour later if symptoms persist. (MAX: 1.8 mg total daily) 04/12/19   Martyn Malay, MD  ferrous sulfate 325 (65 FE) MG tablet Take 325 mg by mouth daily. 06/12/16   [provider]  furosemide (LASIX) 40 MG tablet Take 40 mg by mouth daily.    [provider]  losartan (COZAAR) 100 MG tablet Take 1 tablet (100 mg total) by mouth daily. 11/21/21   Salvadore Oxford, MD  Multiple Vitamin (MULTIVITAMIN WITH MINERALS) TABS tablet Take 1 tablet by mouth daily.    [provider]  [facility] ULTRA test strip USE TO TEST BLOOD SUGARS 3X DAILY. E11.9 08/28/21   Gladys Damme, MD  oxybutynin (DITROPAN-XL) 5 MG 24 hr tablet Take 5 mg by mouth daily. 01/24/21   [provider]  Semaglutide, 1 MG/DOSE, (OZEMPIC, 1 MG/DOSE,) 4 MG/3ML  SOPN INJECT SUBCUTANEOUSLY 1 MG EVERY WEEK 11/21/21   Salvadore Oxford, MD  spironolactone (ALDACTONE) 25 MG tablet Take 1 tablet (25 mg total) by mouth daily. 11/21/21   Salvadore Oxford, MD  vitamin B-12 (CYANOCOBALAMIN) 1000 MCG tablet Take 1,000 mcg by mouth daily.     [provider]      Allergies    Ciprofloxacin hcl, Metformin and related, Ace inhibitors, Amlodipine, Baclofen, Ciprofloxacin, Feraheme [ferumoxytol], Metformin, and Lisinopril    Review of Systems   Review of Systems  Physical Exam Updated Vital Signs BP (!) 160/80   Pulse 67   Temp 98.6 F (37 C) (Oral)   Resp (!) 25   Ht '5\' 3"'$  (1.6 m)   Wt 90 kg    SpO2 100%   BMI 35.15 kg/m  Physical Exam Vitals and nursing note reviewed.  Constitutional:      General: She is not in acute distress.    Appearance: She is well-developed.  HENT:     Head: Normocephalic and atraumatic.     Mouth/Throat:     Mouth: Mucous membranes are moist.  Eyes:     General: Vision grossly intact. Gaze aligned appropriately.     Extraocular Movements: Extraocular movements intact.     Conjunctiva/sclera: Conjunctivae normal.  Cardiovascular:     Rate and Rhythm: Normal rate and regular rhythm.     Pulses: Normal pulses.     Heart sounds: Normal heart sounds, S1 normal and S2 normal. No murmur heard.    No friction rub. No gallop.  Pulmonary:     Effort: Pulmonary effort is normal. No respiratory distress.     Breath sounds: Normal breath sounds.  Abdominal:     General: Bowel sounds are normal.     Palpations: Abdomen is soft.     Tenderness: There is no abdominal tenderness. There is no guarding or rebound.     Hernia: No hernia is present.  Musculoskeletal:        General: No swelling.     Cervical back: Full passive range of motion without pain, normal range of motion and neck supple. No spinous process tenderness or muscular tenderness. Normal range of motion.     Right lower leg: No edema.     Left lower leg: No edema.  Skin:    General: Skin is warm and dry.     Capillary Refill: Capillary refill takes less than 2 seconds.     Findings: No ecchymosis, erythema, rash or wound.  Neurological:     General: No focal deficit present.     Mental Status: She is alert and oriented to person, place, and time.     GCS: GCS eye subscore is 4. GCS verbal subscore is 5. GCS motor subscore is 6.     Cranial Nerves: Cranial nerves 2-12 are intact.     Sensory: Sensation is intact.     Motor: Motor function is intact.     Coordination: Coordination is intact.  Psychiatric:        Attention and Perception: Attention normal.        Mood and Affect: Mood  normal.        Speech: Speech normal.        Behavior: Behavior normal.     ED Results / Procedures / Treatments   Labs (all labs ordered are listed, but only abnormal results are displayed) Labs Reviewed  URINALYSIS, ROUTINE W REFLEX MICROSCOPIC - Abnormal; Notable for the following components:  Result Value   APPearance CLOUDY (*)    Protein, ur >=300 (*)    Leukocytes,Ua LARGE (*)    WBC, UA >50 (*)    Bacteria, UA RARE (*)    All other components within normal limits  COMPREHENSIVE METABOLIC PANEL - Abnormal; Notable for the following components:   Chloride 113 (*)    Glucose, Bld 100 (*)    BUN 57 (*)    Creatinine, Ser 4.35 (*)    Calcium 8.7 (*)    Albumin 3.4 (*)    AST 14 (*)    GFR, Estimated 10 (*)    Anion gap 4 (*)    All other components within normal limits  CBC WITH DIFFERENTIAL/PLATELET - Abnormal; Notable for the following components:   RBC 3.65 (*)    Hemoglobin 9.7 (*)    HCT 33.4 (*)    MCHC 29.0 (*)    All other components within normal limits  URINE CULTURE  WET PREP, GENITAL  CULTURE, BLOOD (ROUTINE X 2)  CULTURE, BLOOD (ROUTINE X 2)  LACTIC ACID, PLASMA  PROTIME-INR  APTT  LACTIC ACID, PLASMA    EKG EKG Interpretation  Date/Time:  Thursday June 04 2022 23:29:26 EST Ventricular Rate:  67 PR Interval:  175 QRS Duration: 87 QT Interval:  393 QTC Calculation: 415 R Axis:   -25 Text Interpretation: Sinus rhythm Borderline left axis deviation Consider anterior infarct Confirmed by Orpah Greek [phone]) on 06/04/2022 11:31:47 PM  Radiology DG Chest Port 1 View  Result Date: 06/04/2022 CLINICAL DATA:  Possible sepsis EXAM: PORTABLE CHEST 1 VIEW COMPARISON:  03/27/2021 FINDINGS: The heart size and mediastinal contours are within normal limits. Both lungs are clear. The visualized skeletal structures are unremarkable. IMPRESSION: No active disease. Electronically Signed   By: Inez Catalina M.D.   On: 06/04/2022 23:28     Procedures Procedures    Medications Ordered in ED Medications  cefTRIAXone (ROCEPHIN) 2 g in sodium chloride 0.9 % 100 mL IVPB (0 g Intravenous Stopped 06/05/22 0025)    ED Course/ Medical Decision Making/ A&P                             Medical Decision Making Amount and/or Complexity of Data Reviewed External Data Reviewed: labs and notes. Labs: ordered. Decision-making details documented in ED Course. Radiology: ordered and independent interpretation performed. Decision-making details documented in ED Course. ECG/medicine tests: ordered and independent interpretation performed. Decision-making details documented in ED Course.  Risk Prescription drug management.   Patient presents to the emergency department with concerns over possible urinary tract infection.  Patient reports a history of recurrent UTI in the past.  Patient has recurrent symptoms similar to previous UTI.  She appears well.  She reports that she felt some chills earlier but is afebrile at arrival.  Blood work is unremarkable, no leukocytosis, normal lactic acid.  She does not have any SIRS criteria currently.  Urinalysis does suggest infection.  Urine cultures have been sensitive E. coli as well as Pseudomonas in the past.  Reports that she has never had problems treating with oral antibiotics in the past.  Given Rocephin 2 g here in the emergency department, discharged with keflex, culture pending.        Final Clinical Impression(s) / ED Diagnoses Final diagnoses:  Urinary tract infection without hematuria, site unspecified    Rx / DC Orders ED Discharge Orders  Ordered    cephALEXin (KEFLEX) 500 MG capsule  3 times daily        06/05/22 0032    fluconazole (DIFLUCAN) 150 MG tablet  Weekly        06/05/22 0032              Orpah Greek, MD 06/05/22 [phone]

## 2022-06-05 DIAGNOSIS — H59031 Cystoid macular edema following cataract surgery, right eye: Secondary | ICD-10-CM | POA: Diagnosis not present

## 2022-06-05 LAB — COMPREHENSIVE METABOLIC PANEL
ALT: 12 U/L (ref 0–44)
AST: 14 U/L — ABNORMAL LOW (ref 15–41)
Albumin: 3.4 g/dL — ABNORMAL LOW (ref 3.5–5.0)
Alkaline Phosphatase: 67 U/L (ref 38–126)
Anion gap: 4 — ABNORMAL LOW (ref 5–15)
BUN: 57 mg/dL — ABNORMAL HIGH (ref 8–23)
CO2: 22 mmol/L (ref 22–32)
Calcium: 8.7 mg/dL — ABNORMAL LOW (ref 8.9–10.3)
Chloride: 113 mmol/L — ABNORMAL HIGH (ref 98–111)
Creatinine, Ser: 4.35 mg/dL — ABNORMAL HIGH (ref 0.44–1.00)
GFR, Estimated: 10 mL/min — ABNORMAL LOW (ref >60)
Glucose, Bld: 100 mg/dL — ABNORMAL HIGH (ref 70–99)
Potassium: 4.6 mmol/L (ref 3.5–5.1)
Sodium: 139 mmol/L (ref 135–145)
Total Bilirubin: 0.5 mg/dL (ref 0.3–1.2)
Total Protein: 7.3 g/dL (ref 6.5–8.1)

## 2022-06-05 LAB — PROTIME-INR
INR: 1.1 (ref 0.8–1.2)
Prothrombin Time: 14.5 seconds (ref 11.4–15.2)

## 2022-06-05 LAB — LACTIC ACID, PLASMA: Lactic Acid, Venous: 1 mmol/L (ref 0.5–1.9)

## 2022-06-05 LAB — APTT: aPTT: 33 seconds (ref 24–36)

## 2022-06-05 MED ORDER — FLUCONAZOLE 150 MG PO TABS: 150.0000 mg | ORAL_TABLET | ORAL | 0 refills | Status: AC

## 2022-06-05 MED ORDER — CEPHALEXIN 500 MG PO CAPS: 500.0000 mg | ORAL_CAPSULE | Freq: Three times a day (TID) | ORAL | 0 refills | Status: DC

## 2022-06-06 LAB — URINE CULTURE: Culture: >=100000 — AB

## 2022-06-07 ENCOUNTER — Telehealth (HOSPITAL_BASED_OUTPATIENT_CLINIC_OR_DEPARTMENT_OTHER): Payer: Self-pay | Admitting: *Deleted

## 2022-06-07 NOTE — Telephone Encounter (Signed)
Post ED Visit - Positive Culture Follow-up  Culture report reviewed by antimicrobial stewardship pharmacist: [place] Team '[]'$  Elenor Quinones, Pharm.D. '[]'$  Heide Guile, Pharm.D., BCPS AQ-ID '[]'$  Parks Neptune, Pharm.D., BCPS '[]'$  Alycia Rossetti, Pharm.D., BCPS '[]'$  [place], [place].D., BCPS, AAHIVP '[]'$  Legrand Como, Pharm.D., BCPS, AAHIVP '[]'$  Salome Arnt, PharmD, BCPS '[]'$  Johnnette Gourd, PharmD, BCPS '[]'$  Hughes Better, PharmD, BCPS '[]'$  Leeroy Cha, PharmD '[]'$  Laqueta Linden, PharmD, BCPS '[]'$  Albertina Parr, PharmD  [place] Team '[]'$  Leodis Sias, PharmD '[]'$  Lindell Spar, PharmD '[x]'$  Royetta Asal, PharmD '[]'$  Graylin Shiver, Rph '[]'$  Rema Fendt) Glennon Mac, PharmD '[]'$  Arlyn Dunning, PharmD '[]'$  Netta Cedars, PharmD '[]'$  Dia Sitter, PharmD '[]'$  Leone Haven, PharmD '[]'$  Gretta Arab, PharmD '[]'$  Theodis Shove, PharmD '[]'$  Peggyann Juba, PharmD '[]'$  Reuel Boom, PharmD   Positive urine culture Treated with Cephalexin, organism sensitive to the same and no further patient follow-up is required at this time.  Rosie Fate 06/07/2022, 1:39 PM

## 2022-06-10 ENCOUNTER — Telehealth: Payer: Self-pay

## 2022-06-10 LAB — CULTURE, BLOOD (ROUTINE X 2)
Culture: NO GROWTH
Culture: NO GROWTH
Special Requests: ADEQUATE

## 2022-06-10 NOTE — Patient Outreach (Signed)
  Care Coordination TOC Note Transition Care Management Follow-up Telephone Call Date of discharge and from where: [facility] 06/04/22- EMMI How have you been since you were released from the hospital? Patient notes she is feeling better from her UTI.   Any questions or concerns? Yes- Asked patient about her response to the EMMI call about feeling hopeless and sad.  She denies feeling sad and hopeless, notes she did not respond to the questions on the call.  This Probation officer gave her information about resources such as a LCSW appointment if she would like, at no cost to her and she declined.  Ms. Drury requests a refill of her Ozempic.  Message sent to [facility] RN triage pool.  Items Reviewed: Did the pt receive and understand the discharge instructions provided? Yes  Medications obtained and verified? Yes  Other? No  Any new allergies since your discharge? No  Dietary orders reviewed? No Do you have support at home? Yes   Home Care and Equipment/Supplies: Were home health services ordered? no If so, what is the name of the agency? N/a  Has the agency set up a time to come to the patient's home? no Were any new equipment or medical supplies ordered?  No What is the name of the medical supply agency? N/a Were you able to get the supplies/equipment? no Do you have any questions related to the use of the equipment or supplies? No  Functional Questionnaire: (I = Independent and D = Dependent) ADLs: I  Bathing/Dressing- I  Meal Prep- I  Eating- I  Maintaining continence- I  Transferring/Ambulation- I  Managing Meds- I  Follow up appointments reviewed:  PCP Hospital f/u appt confirmed? No   Specialist Hospital f/u appt confirmed? No   Are transportation arrangements needed? No  If their condition worsens, is the pt aware to call PCP or go to the Emergency Dept.? No Was the patient provided with contact information for the PCP's office or ED? No Was to pt encouraged to call back with  questions or concerns? No  SDOH assessments and interventions completed:   Yes SDOH Interventions Today    Flowsheet Row Most Recent Value  SDOH Interventions   Food Insecurity Interventions Intervention Not Indicated  Housing Interventions Intervention Not Indicated       Care Coordination Interventions:  Message sent to [facility] RN pool regarding Ozempic    Encounter Outcome:  Pt. Visit Completed

## 2022-06-11 ENCOUNTER — Other Ambulatory Visit: Payer: Self-pay

## 2022-06-11 DIAGNOSIS — E1140 Type 2 diabetes mellitus with diabetic neuropathy, unspecified: Secondary | ICD-10-CM

## 2022-06-11 MED ORDER — OZEMPIC (1 MG/DOSE) 4 MG/3ML ~~LOC~~ SOPN: PEN_INJECTOR | SUBCUTANEOUS | 3 refills | Status: DC

## 2022-06-19 DIAGNOSIS — R2689 Other abnormalities of gait and mobility: Secondary | ICD-10-CM | POA: Diagnosis not present

## 2022-06-19 DIAGNOSIS — M545 Low back pain, unspecified: Secondary | ICD-10-CM | POA: Diagnosis not present

## 2022-06-19 DIAGNOSIS — M6281 Muscle weakness (generalized): Secondary | ICD-10-CM | POA: Diagnosis not present

## 2022-06-19 DIAGNOSIS — M2560 Stiffness of unspecified joint, not elsewhere classified: Secondary | ICD-10-CM | POA: Diagnosis not present

## 2022-06-24 DIAGNOSIS — M545 Low back pain, unspecified: Secondary | ICD-10-CM | POA: Diagnosis not present

## 2022-06-24 DIAGNOSIS — M2560 Stiffness of unspecified joint, not elsewhere classified: Secondary | ICD-10-CM | POA: Diagnosis not present

## 2022-06-24 DIAGNOSIS — R2689 Other abnormalities of gait and mobility: Secondary | ICD-10-CM | POA: Diagnosis not present

## 2022-06-24 DIAGNOSIS — M6281 Muscle weakness (generalized): Secondary | ICD-10-CM | POA: Diagnosis not present

## 2022-06-26 ENCOUNTER — Other Ambulatory Visit: Payer: Self-pay | Admitting: *Deleted

## 2022-06-26 DIAGNOSIS — M2560 Stiffness of unspecified joint, not elsewhere classified: Secondary | ICD-10-CM | POA: Diagnosis not present

## 2022-06-26 DIAGNOSIS — M545 Low back pain, unspecified: Secondary | ICD-10-CM | POA: Diagnosis not present

## 2022-06-26 DIAGNOSIS — N185 Chronic kidney disease, stage 5: Secondary | ICD-10-CM

## 2022-06-26 DIAGNOSIS — M6281 Muscle weakness (generalized): Secondary | ICD-10-CM | POA: Diagnosis not present

## 2022-06-26 DIAGNOSIS — R2689 Other abnormalities of gait and mobility: Secondary | ICD-10-CM | POA: Diagnosis not present

## 2022-06-29 DIAGNOSIS — M2560 Stiffness of unspecified joint, not elsewhere classified: Secondary | ICD-10-CM | POA: Diagnosis not present

## 2022-06-29 DIAGNOSIS — M545 Low back pain, unspecified: Secondary | ICD-10-CM | POA: Diagnosis not present

## 2022-06-29 DIAGNOSIS — R2689 Other abnormalities of gait and mobility: Secondary | ICD-10-CM | POA: Diagnosis not present

## 2022-06-29 DIAGNOSIS — M6281 Muscle weakness (generalized): Secondary | ICD-10-CM | POA: Diagnosis not present

## 2022-07-01 DIAGNOSIS — M545 Low back pain, unspecified: Secondary | ICD-10-CM | POA: Diagnosis not present

## 2022-07-01 DIAGNOSIS — M6281 Muscle weakness (generalized): Secondary | ICD-10-CM | POA: Diagnosis not present

## 2022-07-01 DIAGNOSIS — R2689 Other abnormalities of gait and mobility: Secondary | ICD-10-CM | POA: Diagnosis not present

## 2022-07-01 DIAGNOSIS — M2560 Stiffness of unspecified joint, not elsewhere classified: Secondary | ICD-10-CM | POA: Diagnosis not present

## 2022-07-02 ENCOUNTER — Ambulatory Visit (HOSPITAL_COMMUNITY)
Admission: RE | Admit: 2022-07-02 | Discharge: 2022-07-02 | Disposition: A | Discharge status: Home / Self Care | Payer: 59 | Source: Ambulatory Visit | Attending: Vascular Surgery | Admitting: Vascular Surgery

## 2022-07-02 ENCOUNTER — Ambulatory Visit (INDEPENDENT_AMBULATORY_CARE_PROVIDER_SITE_OTHER)
Admission: RE | Admit: 2022-07-02 | Discharge: 2022-07-02 | Disposition: A | Discharge status: Home / Self Care | Payer: 59 | Source: Ambulatory Visit | Attending: Vascular Surgery | Admitting: Vascular Surgery

## 2022-07-02 DIAGNOSIS — N185 Chronic kidney disease, stage 5: Secondary | ICD-10-CM

## 2022-07-02 DIAGNOSIS — E1140 Type 2 diabetes mellitus with diabetic neuropathy, unspecified: Secondary | ICD-10-CM | POA: Diagnosis not present

## 2022-07-02 NOTE — Progress Notes (Signed)
Office Note     CC:  ESRD Requesting Provider:  Elmarie Shiley, MD  HPI: Michelle Abbott is a Right handed 77 y.o. ([birth year]) female with kidney disease who presents at the request of Elmarie Shiley, MD for permanent HD access. The patient has had no prior access procedures.  No tunneled lines.  CKD 5.  On exam, Michelle Abbott was doing well.  Stated she was nervous. Recently from [place], she moved to [place] to be closer to her daughter and grandson, who is 71. She continues to live independently, and is currently CKD 5. She has been to dialysis education sessions, but stated they were unhelpful as they were over her head.   Past Medical History:  Diagnosis Date   Acute kidney injury superimposed on chronic kidney disease ([place]) 04/28/2020   Anemia    Arthritis    Chronic kidney disease    Stage 4   Diabetes mellitus without complication ([place])    type 2   Gout 2017   Hyperlipidemia    Hypertension    Hypertensive nephropathy 05/09/2021   Nephrotic range proteinuria 05/09/2021   Postoperative pain 04/29/2020   Rectal bleeding, History of 12/28/2019   Sciatica    Secondary hyperparathyroidism of renal origin ([place]) 05/09/2021   SI (sacroiliac) joint inflammation ([place]) 03/12/2019   UTI (urinary tract infection) 02/02/2019   Vitamin D deficiency 2014    Past Surgical History:  Procedure Laterality Date   BREAST SURGERY Right 1961   Benign lump removed   CATARACT EXTRACTION, BILATERAL Bilateral 2017   [place] AND CURETTAGE OF UTERUS  2017   KNEE ARTHROSCOPY Right    VAGINAL SEPTOPLASTY  1966    Social History   Socioeconomic History   Marital status: Widowed    Spouse name: Not on file   Number of children: 2   Years of education: 64   Highest education level: Some college, no degree  Occupational History   Occupation: Art therapist     Comment: Developmentally disabled persons   Tobacco Use   Smoking status: Former    Packs/day: 1.50    Years: 30.00    Total pack years: 45.00    Types: Cigarettes    Quit date: 05/18/1997    Years since quitting: 25.1    Passive exposure: Past   Smokeless tobacco: Never   Tobacco comments:    1.5 - 2 ppd for 30 years:   no plans to re-start  Vaping Use   Vaping Use: Never used  Substance and Sexual Activity   Alcohol use: No   Drug use: No   Sexual activity: Not Currently  Other Topics Concern   Not on file  Social History Narrative   Lives by herself in one level apt. Daughter lives in same complex. Has grab bars in bathroom. Smoke alarms, one rug under coffee table and at door.   No pets.   Recently moved from [place] in last two years.    Daughter helps with transportation.      Likes to eat meat, some vegetables, fruit. Drinks to drink sweet tea, some coffee. Get $15 in food stamps but runs out of food before end of month.   Enjoys shopping and fellowship. Writes poetry and plays. Word puzzles.   Social Determinants of Health   Financial Resource Strain: Medium Risk (04/07/2018)   Overall Financial Resource Strain (CARDIA)  Difficulty of Paying Living Expenses: Somewhat hard  Food Insecurity: No Food Insecurity (06/10/2022)   Hunger Vital Sign    Worried About Running Out of Food in the Last Year: Never true    Ran Out of Food in the Last Year: Never true  Transportation Needs: No Transportation Needs (04/07/2018)   PRAPARE - Hydrologist (Medical): No    Lack of Transportation (Non-Medical): No  Physical Activity: Insufficiently Active (04/07/2018)   Exercise Vital Sign    Days of Exercise per Week: 1 day    Minutes of Exercise per Session: 20 min  Stress: No Stress Concern Present (04/07/2018)   [place]    Feeling of Stress : Only a little  Social Connections: Moderately Integrated (04/07/2018)   Social  Connection and Isolation Panel [NHANES]    Frequency of Communication with Friends and Family: More than three times a week    Frequency of Social Gatherings with Friends and Family: More than three times a week    Attends Religious Services: More than 4 times per year    Active Member of [employer] or Organizations: Yes    Attends Archivist Meetings: 1 to 4 times per year    Marital Status: Widowed  Intimate Partner Violence: Not At Risk (04/07/2018)   Humiliation, Afraid, Rape, and Kick questionnaire    Fear of Current or Ex-Partner: No    Emotionally Abused: No    Physically Abused: No    Sexually Abused: No   *** Family History  Problem Relation Age of Onset   Cancer Mother        ovarian and pancreatic   Diabetes Mother    Stroke Mother    COPD Father    Arthritis Father    Cancer Brother        Lung   COPD Brother     Current Outpatient Medications  Medication Sig Dispense Refill   ACCU-CHEK FASTCLIX LANCETS MISC TEST BLOOD SUGAR FOUR TIMES DAILY 408 each 11   acetaminophen (TYLENOL 8 HOUR) 650 MG CR tablet Take 1 tablet (650 mg total) by mouth every 8 (eight) hours as needed for pain. 30 tablet 1   allopurinol (ZYLOPRIM) 100 MG tablet Take 1 tablet (100 mg total) by mouth daily. (Patient taking differently: Take 100 mg by mouth daily. Taking twice per week) 90 tablet 3   atorvastatin (LIPITOR) 40 MG tablet Take 1 tablet (40 mg total) by mouth daily. 90 tablet 3   B-D ULTRAFINE III SHORT PEN 31G X 8 MM MISC Use to give insulin twice a day and victoza once a day. Dx code = E11.9 300 each 3   Blood Glucose Monitoring Suppl (ONETOUCH VERIO) w/Device KIT Use to check blood sugars 3x daily. E11.9 1 kit 0   carvedilol (COREG) 25 MG tablet Take 25 mg by mouth 2 (two) times daily with a meal.     cephALEXin (KEFLEX) 500 MG capsule Take 1 capsule (500 mg total) by mouth 3 (three) times daily. 21 capsule 0   cholecalciferol (VITAMIN D) 1000 units tablet Take 1,000 Units by  mouth daily.     colchicine 0.6 MG tablet Take 2 tablets (1.2 mg total) at onset of gout flare then take 1 tablet (0.6 mg) one hour later if symptoms persist. (MAX: 1.8 mg total daily) (Patient taking differently: Take 0.6-1.2 mg by mouth See admin instructions. Take 2 tablets (1.2 mg total) at onset  of gout flare then take 1 tablet (0.6 mg) one hour later if symptoms persist. (MAX: 1.8 mg total daily)) 30 tablet 1   ferrous sulfate 325 (65 FE) MG tablet Take 325 mg by mouth daily.     furosemide (LASIX) 40 MG tablet Take 40 mg by mouth daily.     losartan (COZAAR) 100 MG tablet Take 1 tablet (100 mg total) by mouth daily. 90 tablet 3   Multiple Vitamin (MULTIVITAMIN WITH MINERALS) TABS tablet Take 1 tablet by mouth daily.     ONETOUCH ULTRA test strip USE TO TEST BLOOD SUGARS 3X DAILY. E11.9 100 strip 4   oxybutynin (DITROPAN-XL) 5 MG 24 hr tablet Take 5 mg by mouth daily.     Semaglutide, 1 MG/DOSE, (OZEMPIC, 1 MG/DOSE,) 4 MG/3ML SOPN INJECT SUBCUTANEOUSLY 1 MG EVERY WEEK 9 mL 3   spironolactone (ALDACTONE) 25 MG tablet Take 1 tablet (25 mg total) by mouth daily. 90 tablet 3   vitamin B-12 (CYANOCOBALAMIN) 1000 MCG tablet Take 1,000 mcg by mouth daily.      No current facility-administered medications for this visit.    Allergies  Allergen Reactions   Ciprofloxacin Hcl Other (See Comments)    Burning at IV site   Metformin And Related Other (See Comments)    CKD    Ace Inhibitors Cough    Lisinopril   Amlodipine Swelling    Lower leg swelling.    Baclofen Nausea Only and Other (See Comments)    Disoriented, dizziness, weakness, fatigue.    Ciprofloxacin    Feraheme [Ferumoxytol] Other (See Comments)    Chest pain    Metformin    Lisinopril Palpitations     REVIEW OF SYSTEMS:  *** [X]$  denotes positive finding, [ ]$  denotes negative finding Cardiac  Comments:  Chest pain or chest pressure:    Shortness of breath upon exertion:    Short of breath when lying flat:    Irregular  heart rhythm:        Vascular    Pain in calf, thigh, or hip brought on by ambulation:    Pain in feet at night that wakes you up from your sleep:     Blood clot in your veins:    Leg swelling:         Pulmonary    Oxygen at home:    Productive cough:     Wheezing:         Neurologic    Sudden weakness in arms or legs:     Sudden numbness in arms or legs:     Sudden onset of difficulty speaking or slurred speech:    Temporary loss of vision in one eye:     Problems with dizziness:         Gastrointestinal    Blood in stool:     Vomited blood:         Genitourinary    Burning when urinating:     Blood in urine:        Psychiatric    Major depression:         Hematologic    Bleeding problems:    Problems with blood clotting too easily:        Skin    Rashes or ulcers:        Constitutional    Fever or chills:      PHYSICAL EXAMINATION:  There were no vitals filed for this visit.  General:  WDWN in NAD; vital signs  documented above Gait: Not observed HENT: WNL, normocephalic Pulmonary: normal non-labored breathing , without Rales, rhonchi,  wheezing Cardiac: {Desc; regular/irreg:14544} HR, without  Murmurs {With/Without:20273} carotid bruit*** Abdomen: soft, NT, no masses Skin: {With/Without:20273} rashes Vascular Exam/Pulses:  Right Left  Radial {Exam; arterial pulse strength 0-4:30167} {Exam; arterial pulse strength 0-4:30167}  Ulnar {Exam; arterial pulse strength 0-4:30167} {Exam; arterial pulse strength 0-4:30167}  Femoral {Exam; arterial pulse strength 0-4:30167} {Exam; arterial pulse strength 0-4:30167}  Popliteal {Exam; arterial pulse strength 0-4:30167} {Exam; arterial pulse strength 0-4:30167}  DP {Exam; arterial pulse strength 0-4:30167} {Exam; arterial pulse strength 0-4:30167}  PT {Exam; arterial pulse strength 0-4:30167} {Exam; arterial pulse strength 0-4:30167}   Extremities: {With/Without:20273} ischemic changes, {With/Without:20273} Gangrene  , {With/Without:20273} cellulitis; {With/Without:20273} open wounds;  Musculoskeletal: no muscle wasting or atrophy  Neurologic: A&O X 3;  No focal weakness or paresthesias are detected Psychiatric:  The pt has {Desc; normal/abnormal:11317::"Normal"} affect.   Non-Invasive Vascular Imaging:   See CV studies    ASSESSMENT/PLAN:  Michelle Abbott is a 77 y.o. female who presents with chronic kidney disease stage 5  Based on vein mapping and examination, Michelle Abbott will require AV graft.  Being that she is right-handed, I recommended left arm AV graft placement. Nephrology referral made mention of tunneled dialysis catheter and fistula, but stated to wait on an AV graft.  I have reached out to Dr. Posey Pronto in an effort to ensure she gets the necessary care. This time, Michelle Abbott was not ready to pursue any dialysis access, and asked to be seen again in a couple weeks after her next appoint with Dr. Posey Pronto.  I have scheduled her for 2-week follow-up.   Broadus John, MD [facility] [phone]

## 2022-07-03 ENCOUNTER — Ambulatory Visit (INDEPENDENT_AMBULATORY_CARE_PROVIDER_SITE_OTHER): Payer: 59 | Admitting: Vascular Surgery

## 2022-07-03 ENCOUNTER — Encounter: Payer: Self-pay | Admitting: Vascular Surgery

## 2022-07-03 VITALS — BP 186/86 | HR 96 | Temp 98.1°F | Resp 20 | Ht 63.0 in | Wt 193.0 lb

## 2022-07-03 DIAGNOSIS — N185 Chronic kidney disease, stage 5: Secondary | ICD-10-CM

## 2022-07-06 DIAGNOSIS — M6281 Muscle weakness (generalized): Secondary | ICD-10-CM | POA: Diagnosis not present

## 2022-07-06 DIAGNOSIS — M545 Low back pain, unspecified: Secondary | ICD-10-CM | POA: Diagnosis not present

## 2022-07-06 DIAGNOSIS — M2560 Stiffness of unspecified joint, not elsewhere classified: Secondary | ICD-10-CM | POA: Diagnosis not present

## 2022-07-06 DIAGNOSIS — R2689 Other abnormalities of gait and mobility: Secondary | ICD-10-CM | POA: Diagnosis not present

## 2022-07-09 DIAGNOSIS — N185 Chronic kidney disease, stage 5: Secondary | ICD-10-CM | POA: Diagnosis not present

## 2022-07-09 DIAGNOSIS — I120 Hypertensive chronic kidney disease with stage 5 chronic kidney disease or end stage renal disease: Secondary | ICD-10-CM | POA: Diagnosis not present

## 2022-07-09 DIAGNOSIS — N2581 Secondary hyperparathyroidism of renal origin: Secondary | ICD-10-CM | POA: Diagnosis not present

## 2022-07-09 DIAGNOSIS — D631 Anemia in chronic kidney disease: Secondary | ICD-10-CM | POA: Diagnosis not present

## 2022-07-09 DIAGNOSIS — N189 Chronic kidney disease, unspecified: Secondary | ICD-10-CM | POA: Diagnosis not present

## 2022-07-13 DIAGNOSIS — R2689 Other abnormalities of gait and mobility: Secondary | ICD-10-CM | POA: Diagnosis not present

## 2022-07-13 DIAGNOSIS — M2560 Stiffness of unspecified joint, not elsewhere classified: Secondary | ICD-10-CM | POA: Diagnosis not present

## 2022-07-13 DIAGNOSIS — M6281 Muscle weakness (generalized): Secondary | ICD-10-CM | POA: Diagnosis not present

## 2022-07-13 DIAGNOSIS — M545 Low back pain, unspecified: Secondary | ICD-10-CM | POA: Diagnosis not present

## 2022-07-17 ENCOUNTER — Ambulatory Visit: Payer: 59 | Admitting: Vascular Surgery

## 2022-07-20 DIAGNOSIS — M6281 Muscle weakness (generalized): Secondary | ICD-10-CM | POA: Diagnosis not present

## 2022-07-20 DIAGNOSIS — M545 Low back pain, unspecified: Secondary | ICD-10-CM | POA: Diagnosis not present

## 2022-07-20 DIAGNOSIS — M2560 Stiffness of unspecified joint, not elsewhere classified: Secondary | ICD-10-CM | POA: Diagnosis not present

## 2022-07-20 DIAGNOSIS — R2689 Other abnormalities of gait and mobility: Secondary | ICD-10-CM | POA: Diagnosis not present

## 2022-07-31 ENCOUNTER — Ambulatory Visit: Payer: 59 | Admitting: Vascular Surgery

## 2022-08-03 DIAGNOSIS — M2560 Stiffness of unspecified joint, not elsewhere classified: Secondary | ICD-10-CM | POA: Diagnosis not present

## 2022-08-03 DIAGNOSIS — M6281 Muscle weakness (generalized): Secondary | ICD-10-CM | POA: Diagnosis not present

## 2022-08-03 DIAGNOSIS — M545 Low back pain, unspecified: Secondary | ICD-10-CM | POA: Diagnosis not present

## 2022-08-03 DIAGNOSIS — R2689 Other abnormalities of gait and mobility: Secondary | ICD-10-CM | POA: Diagnosis not present

## 2022-08-06 NOTE — Progress Notes (Signed)
Office Note     CC:  ESRD Requesting Provider:  Salvadore Oxford, MD  HPI: Michelle Abbott is a Right handed 77 y.o. (1945-09-17) female with kidney disease who presents at the request of Salvadore Oxford, MD for permanent HD access. The patient has had no prior access procedures.  No tunneled lines.  CKD 5.  On exam, Michelle Abbott was doing well, she was not happy about her chronic kidney disease. Originally from [place], she moved to [place] to be closer to her daughter and grandson, who is 30. She continues to live independently, and is currently CKD 5.  Last seen, she has gone to dialysis education class.  Remains undecided between hemodialysis and peritoneal dialysis. Surgical history includes C-section.   Past Medical History:  Diagnosis Date   Acute kidney injury superimposed on chronic kidney disease ([place]) 04/28/2020   Anemia    Arthritis    Chronic kidney disease    Stage 4   Diabetes mellitus without complication ([place])    type 2   Gout 2017   Hyperlipidemia    Hypertension    Hypertensive nephropathy 05/09/2021   Nephrotic range proteinuria 05/09/2021   Postoperative pain 04/29/2020   Rectal bleeding, History of 12/28/2019   Sciatica    Secondary hyperparathyroidism of renal origin ([place]) 05/09/2021   SI (sacroiliac) joint inflammation ([place]) 03/12/2019   UTI (urinary tract infection) 02/02/2019   Vitamin D deficiency 2014    Past Surgical History:  Procedure Laterality Date   BREAST SURGERY Right 1961   Benign lump removed   CATARACT EXTRACTION, BILATERAL Bilateral 2017   [place] AND CURETTAGE OF UTERUS  2017   KNEE ARTHROSCOPY Right    VAGINAL SEPTOPLASTY  1966    Social History   Socioeconomic History   Marital status: Widowed    Spouse name: Not on file   Number of children: 2   Years of education: 58   Highest education level: Some college, no degree   Occupational History   Occupation: Art therapist     Comment: Developmentally disabled persons  Tobacco Use   Smoking status: Former    Packs/day: 1.50    Years: 30.00    Additional pack years: 0.00    Total pack years: 45.00    Types: Cigarettes    Quit date: 05/18/1997    Years since quitting: 25.2    Passive exposure: Past   Smokeless tobacco: Never   Tobacco comments:    1.5 - 2 ppd for 30 years:   no plans to re-start  Vaping Use   Vaping Use: Never used  Substance and Sexual Activity   Alcohol use: No   Drug use: No   Sexual activity: Not Currently  Other Topics Concern   Not on file  Social History Narrative   Lives by herself in one level apt. Daughter lives in same complex. Has grab bars in bathroom. Smoke alarms, one rug under coffee table and at door.   No pets.   Recently moved from [place] in last two years.    Daughter helps with transportation.      Likes to eat meat, some vegetables, fruit. Drinks to drink sweet tea, some coffee. Get $15 in food stamps but runs out of food before end of month.   Enjoys shopping and fellowship. Writes poetry and plays. Word puzzles.   Social Determinants of Health  Financial Resource Strain: Medium Risk (04/07/2018)   Overall Financial Resource Strain (CARDIA)    Difficulty of Paying Living Expenses: Somewhat hard  Food Insecurity: No Food Insecurity (06/10/2022)   Hunger Vital Sign    Worried About Running Out of Food in the Last Year: Never true    Ran Out of Food in the Last Year: Never true  Transportation Needs: No Transportation Needs (04/07/2018)   PRAPARE - Hydrologist (Medical): No    Lack of Transportation (Non-Medical): No  Physical Activity: Insufficiently Active (04/07/2018)   Exercise Vital Sign    Days of Exercise per Week: 1 day    Minutes of Exercise per Session: 20 min  Stress: No Stress Concern Present (04/07/2018)   [place]    Feeling of Stress : Only a little  Social Connections: Moderately Integrated (04/07/2018)   Social Connection and Isolation Panel [NHANES]    Frequency of Communication with Friends and Family: More than three times a week    Frequency of Social Gatherings with Friends and Family: More than three times a week    Attends Religious Services: More than 4 times per year    Active Member of [employer] or Organizations: Yes    Attends Archivist Meetings: 1 to 4 times per year    Marital Status: Widowed  Intimate Partner Violence: Not At Risk (04/07/2018)   Humiliation, Afraid, Rape, and Kick questionnaire    Fear of Current or Ex-Partner: No    Emotionally Abused: No    Physically Abused: No    Sexually Abused: No   Family History  Problem Relation Age of Onset   Cancer Mother        ovarian and pancreatic   Diabetes Mother    Stroke Mother    COPD Father    Arthritis Father    Cancer Brother        Lung   COPD Brother     Current Outpatient Medications  Medication Sig Dispense Refill   ACCU-CHEK FASTCLIX LANCETS MISC TEST BLOOD SUGAR FOUR TIMES DAILY 408 each 11   acetaminophen (TYLENOL 8 HOUR) 650 MG CR tablet Take 1 tablet (650 mg total) by mouth every 8 (eight) hours as needed for pain. 30 tablet 1   allopurinol (ZYLOPRIM) 100 MG tablet Take 1 tablet (100 mg total) by mouth daily. (Patient taking differently: Take 100 mg by mouth daily. Taking twice per week) 90 tablet 3   atorvastatin (LIPITOR) 40 MG tablet Take 1 tablet (40 mg total) by mouth daily. 90 tablet 3   B-D ULTRAFINE III SHORT PEN 31G X 8 MM MISC Use to give insulin twice a day and victoza once a day. Dx code = E11.9 300 each 3   Blood Glucose Monitoring Suppl (ONETOUCH VERIO) w/Device KIT Use to check blood sugars 3x daily. E11.9 1 kit 0   carvedilol (COREG) 25 MG tablet Take 25 mg by mouth 2 (two) times daily with a meal.     cephALEXin (KEFLEX) 500 MG capsule Take 1 capsule (500 mg total) by  mouth 3 (three) times daily. 21 capsule 0   cholecalciferol (VITAMIN D) 1000 units tablet Take 1,000 Units by mouth daily.     colchicine 0.6 MG tablet Take 2 tablets (1.2 mg total) at onset of gout flare then take 1 tablet (0.6 mg) one hour later if symptoms persist. (MAX: 1.8 mg total daily) (Patient taking differently: Take  0.6-1.2 mg by mouth See admin instructions. Take 2 tablets (1.2 mg total) at onset of gout flare then take 1 tablet (0.6 mg) one hour later if symptoms persist. (MAX: 1.8 mg total daily)) 30 tablet 1   ferrous sulfate 325 (65 FE) MG tablet Take 325 mg by mouth daily.     furosemide (LASIX) 40 MG tablet Take 40 mg by mouth daily.     losartan (COZAAR) 100 MG tablet Take 1 tablet (100 mg total) by mouth daily. 90 tablet 3   Multiple Vitamin (MULTIVITAMIN WITH MINERALS) TABS tablet Take 1 tablet by mouth daily.     ONETOUCH ULTRA test strip USE TO TEST BLOOD SUGARS 3X DAILY. E11.9 100 strip 4   oxybutynin (DITROPAN-XL) 5 MG 24 hr tablet Take 5 mg by mouth daily.     Semaglutide, 1 MG/DOSE, (OZEMPIC, 1 MG/DOSE,) 4 MG/3ML SOPN INJECT SUBCUTANEOUSLY 1 MG EVERY WEEK 9 mL 3   spironolactone (ALDACTONE) 25 MG tablet Take 1 tablet (25 mg total) by mouth daily. 90 tablet 3   vitamin B-12 (CYANOCOBALAMIN) 1000 MCG tablet Take 1,000 mcg by mouth daily.      No current facility-administered medications for this visit.    Allergies  Allergen Reactions   Ciprofloxacin Hcl Other (See Comments)    Burning at IV site   Metformin And Related Other (See Comments)    CKD    Ace Inhibitors Cough    Lisinopril   Amlodipine Swelling    Lower leg swelling.    Baclofen Nausea Only and Other (See Comments)    Disoriented, dizziness, weakness, fatigue.    Ciprofloxacin    Feraheme [Ferumoxytol] Other (See Comments)    Chest pain    Metformin    Lisinopril Palpitations     REVIEW OF SYSTEMS:  [X]  denotes positive finding, [ ]  denotes negative finding Cardiac  Comments:  Chest pain or  chest pressure:    Shortness of breath upon exertion:    Short of breath when lying flat:    Irregular heart rhythm:        Vascular    Pain in calf, thigh, or hip brought on by ambulation:    Pain in feet at night that wakes you up from your sleep:     Blood clot in your veins:    Leg swelling:         Pulmonary    Oxygen at home:    Productive cough:     Wheezing:         Neurologic    Sudden weakness in arms or legs:     Sudden numbness in arms or legs:     Sudden onset of difficulty speaking or slurred speech:    Temporary loss of vision in one eye:     Problems with dizziness:         Gastrointestinal    Blood in stool:     Vomited blood:         Genitourinary    Burning when urinating:     Blood in urine:        Psychiatric    Major depression:         Hematologic    Bleeding problems:    Problems with blood clotting too easily:        Skin    Rashes or ulcers:        Constitutional    Fever or chills:      PHYSICAL EXAMINATION:  There were  no vitals filed for this visit.  General:  WDWN in NAD; vital signs documented above Gait: Not observed HENT: WNL, normocephalic Pulmonary: normal non-labored breathing , without Rales, rhonchi,  wheezing Cardiac: regular HR, without   Abdomen: soft, NT, no masses Skin: without rashes Vascular Exam/Pulses:  Right Left  Radial 2+ (normal) 2+ (normal)                       Extremities: without ischemic changes, without Gangrene , without cellulitis; without open wounds;  Musculoskeletal: no muscle wasting or atrophy  Neurologic: A&O X 3;  No focal weakness or paresthesias are detected Psychiatric:  The pt has Normal affect.   Non-Invasive Vascular Imaging:   See CV studies    ASSESSMENT/PLAN:  Michelle Abbott is a 77 y.o. female who presents with chronic kidney disease stage 5  At her last visit, we had a long discussion regarding hemodialysis.  During our discussion today, Michelle Abbott noted that she  is interested in peritoneal dialysis as she would like to undergo dialysis at home.  I discussed that this involves a prosthetic tube and requires nightly dialysis.  We discussed that this is a labor-intensive process, which some individuals are not a candidate for. I am most concerned that she lives by herself, and am unsure if she can fully commit to nightly PD.   We also discussed permanent HD access,  which she also asked if she could do at home.    I was honest, and stated I am not sure.  Michelle Abbott would be best served with seeing Dr. Posey Pronto again in an effort to delineate the best care pathway for Michelle Abbott.   I am happy to place a AV graft in the left arm, as she is right-handed.  Should she be deemed a candidate for peritoneal dialysis from a social and medical standpoint, I am happy to refer her to one of my partners who perform PD cath placement to assess for surgical candidacy.  Lastly we discussed the importance of her timeline to end-stage renal disease.  She is aware that a decision in the coming weeks is important to prevent tunneled HD line.   Broadus John, MD [facility] [phone] Total time of patient care including pre-visit research, consultation, and documentation greater than 40 minutes

## 2022-08-07 ENCOUNTER — Telehealth: Payer: Self-pay | Admitting: Family Medicine

## 2022-08-07 ENCOUNTER — Encounter: Payer: Self-pay | Admitting: Vascular Surgery

## 2022-08-07 ENCOUNTER — Ambulatory Visit (INDEPENDENT_AMBULATORY_CARE_PROVIDER_SITE_OTHER): Payer: 59 | Admitting: Vascular Surgery

## 2022-08-07 VITALS — BP 185/79 | HR 69 | Temp 98.3°F | Resp 20 | Ht 63.0 in | Wt 193.0 lb

## 2022-08-07 DIAGNOSIS — N185 Chronic kidney disease, stage 5: Secondary | ICD-10-CM | POA: Diagnosis not present

## 2022-08-07 NOTE — Telephone Encounter (Signed)
Called patient to schedule Medicare Annual Wellness Visit (AWV). Left message for patient to call back and schedule Medicare Annual Wellness Visit (AWV).  Last date of AWV: 04/07/2018   Please schedule an AWVS appointment at any time with [facility].  If any questions, please contact me at [phone].    Thank you,  [facility] Direct dial  [phone]

## 2022-08-10 DIAGNOSIS — M545 Low back pain, unspecified: Secondary | ICD-10-CM | POA: Diagnosis not present

## 2022-08-10 DIAGNOSIS — R2689 Other abnormalities of gait and mobility: Secondary | ICD-10-CM | POA: Diagnosis not present

## 2022-08-10 DIAGNOSIS — M2560 Stiffness of unspecified joint, not elsewhere classified: Secondary | ICD-10-CM | POA: Diagnosis not present

## 2022-08-10 DIAGNOSIS — M6281 Muscle weakness (generalized): Secondary | ICD-10-CM | POA: Diagnosis not present

## 2022-08-24 DIAGNOSIS — I739 Peripheral vascular disease, unspecified: Secondary | ICD-10-CM | POA: Diagnosis not present

## 2022-08-24 DIAGNOSIS — L603 Nail dystrophy: Secondary | ICD-10-CM | POA: Diagnosis not present

## 2022-08-24 DIAGNOSIS — L84 Corns and callosities: Secondary | ICD-10-CM | POA: Diagnosis not present

## 2022-08-24 DIAGNOSIS — E1142 Type 2 diabetes mellitus with diabetic polyneuropathy: Secondary | ICD-10-CM | POA: Diagnosis not present

## 2022-08-24 DIAGNOSIS — E1151 Type 2 diabetes mellitus with diabetic peripheral angiopathy without gangrene: Secondary | ICD-10-CM | POA: Diagnosis not present

## 2022-08-31 DIAGNOSIS — R2689 Other abnormalities of gait and mobility: Secondary | ICD-10-CM | POA: Diagnosis not present

## 2022-08-31 DIAGNOSIS — M2560 Stiffness of unspecified joint, not elsewhere classified: Secondary | ICD-10-CM | POA: Diagnosis not present

## 2022-08-31 DIAGNOSIS — M6281 Muscle weakness (generalized): Secondary | ICD-10-CM | POA: Diagnosis not present

## 2022-08-31 DIAGNOSIS — M5459 Other low back pain: Secondary | ICD-10-CM | POA: Diagnosis not present

## 2022-09-01 DIAGNOSIS — D631 Anemia in chronic kidney disease: Secondary | ICD-10-CM | POA: Diagnosis not present

## 2022-09-01 DIAGNOSIS — N2581 Secondary hyperparathyroidism of renal origin: Secondary | ICD-10-CM | POA: Diagnosis not present

## 2022-09-01 DIAGNOSIS — I120 Hypertensive chronic kidney disease with stage 5 chronic kidney disease or end stage renal disease: Secondary | ICD-10-CM | POA: Diagnosis not present

## 2022-09-01 DIAGNOSIS — N185 Chronic kidney disease, stage 5: Secondary | ICD-10-CM | POA: Diagnosis not present

## 2022-09-11 ENCOUNTER — Ambulatory Visit: Payer: 59 | Admitting: Vascular Surgery

## 2022-09-14 DIAGNOSIS — M5459 Other low back pain: Secondary | ICD-10-CM | POA: Diagnosis not present

## 2022-09-14 DIAGNOSIS — M6281 Muscle weakness (generalized): Secondary | ICD-10-CM | POA: Diagnosis not present

## 2022-09-14 DIAGNOSIS — R2689 Other abnormalities of gait and mobility: Secondary | ICD-10-CM | POA: Diagnosis not present

## 2022-09-14 DIAGNOSIS — M2560 Stiffness of unspecified joint, not elsewhere classified: Secondary | ICD-10-CM | POA: Diagnosis not present

## 2022-09-14 NOTE — Telephone Encounter (Signed)
Contacted Michelle Abbott to schedule their annual wellness visit. Appointment made for 09/18/2022.  Thank you,  [facility] Direct dial  [phone]

## 2022-09-17 ENCOUNTER — Ambulatory Visit (INDEPENDENT_AMBULATORY_CARE_PROVIDER_SITE_OTHER): Payer: 59 | Admitting: Family Medicine

## 2022-09-17 ENCOUNTER — Encounter: Payer: Self-pay | Admitting: Family Medicine

## 2022-09-17 VITALS — BP 179/73 | HR 72 | Ht 63.0 in | Wt 186.4 lb

## 2022-09-17 DIAGNOSIS — E1159 Type 2 diabetes mellitus with other circulatory complications: Secondary | ICD-10-CM

## 2022-09-17 DIAGNOSIS — I152 Hypertension secondary to endocrine disorders: Secondary | ICD-10-CM

## 2022-09-17 DIAGNOSIS — E1140 Type 2 diabetes mellitus with diabetic neuropathy, unspecified: Secondary | ICD-10-CM | POA: Diagnosis not present

## 2022-09-17 DIAGNOSIS — R300 Dysuria: Secondary | ICD-10-CM | POA: Diagnosis not present

## 2022-09-17 DIAGNOSIS — Z1231 Encounter for screening mammogram for malignant neoplasm of breast: Secondary | ICD-10-CM | POA: Diagnosis not present

## 2022-09-17 DIAGNOSIS — N185 Chronic kidney disease, stage 5: Secondary | ICD-10-CM | POA: Diagnosis not present

## 2022-09-17 DIAGNOSIS — Z794 Long term (current) use of insulin: Secondary | ICD-10-CM

## 2022-09-17 LAB — POCT URINALYSIS DIP (MANUAL ENTRY)
Bilirubin, UA: NEGATIVE
Glucose, UA: NEGATIVE mg/dL
Ketones, POC UA: NEGATIVE mg/dL
Nitrite, UA: NEGATIVE
Protein Ur, POC: >=300 mg/dL — AB
Spec Grav, UA: 1.025 (ref 1.010–1.025)
Urobilinogen, UA: 0.2 E.U./dL
pH, UA: 5.5 (ref 5.0–8.0)

## 2022-09-17 LAB — POCT GLYCOSYLATED HEMOGLOBIN (HGB A1C): HbA1c, POC (controlled diabetic range): 5.9 % (ref 0.0–7.0)

## 2022-09-17 MED ORDER — CEPHALEXIN 500 MG PO CAPS: 500.0000 mg | ORAL_CAPSULE | Freq: Two times a day (BID) | ORAL | 0 refills | Status: AC

## 2022-09-17 NOTE — Assessment & Plan Note (Signed)
BP elevated to 162/69. Is not volume overloaded on exam. Patient did not take BP meds today. Instructed to take meds when patient returns home.

## 2022-09-17 NOTE — Assessment & Plan Note (Addendum)
Clinical history consistent with uncomplicated UTI. No fevers or flank pain to suggest complicated UTI. Urinalysis consistent with UTI. 7 day course of Keflex sent to patient's pharmacy. Return precautions discussed. Urine culture sent as well, given second UTI this year.

## 2022-09-17 NOTE — Assessment & Plan Note (Addendum)
Patient has appointment with [facility] in late May. Will discuss hemo versus peritoneal dialysis. Discussed with patient that goal of making these decisions now is to avoid hospitalization. Continue lasix, spiro, losartan. Per chart review electrolytes and GFR stable from 4/16.

## 2022-09-17 NOTE — Assessment & Plan Note (Signed)
A1c today 5.9. Continue Semaglutide.

## 2022-09-17 NOTE — Patient Instructions (Addendum)
It was great to see you! Thank you for allowing me to participate in your care!  I recommend that you always bring your medications to each appointment as this makes it easy to ensure we are on the correct medications and helps [place] not miss when refills are needed.  Our plans for today:  - I sent the antibiotic Keflex to your pharmacy to treat your urinary tract infection. - Please have your Kidney doctor send over your records to your clinic.   Please arrive 15 minutes PRIOR to your next scheduled appointment time! If you do not, this affects OTHER patients' care.  Take care and seek immediate care sooner if you develop any concerns.   Dr. Celine Mans, MD [facility]

## 2022-09-17 NOTE — Progress Notes (Signed)
    SUBJECTIVE:   CHIEF COMPLAINT / HPI:   Last [facility] appointment - Last visit on the 16th. Kidney function still stable. Needs to talk with Dr. Allena Katz first, before deciding on peritoneal versus hemodialysis.  T2DM - Checking sugars at home. States highest was 123. Lowest 96. Taking Ozempic.  Did not take BP meds today.   Having pressure with urination. Urine is cloudy and smells. Still taking oxybutnin. Helping when awake. Ongoing worse for about a week. No blood in urine. No pain in sides. Endorses chills, but no known fevers.  PERTINENT  PMH / PSH: HTN, T2DM, CKD5  OBJECTIVE:   BP (!) 179/73   Pulse 72   Ht 5\' 3"  (1.6 m)   Wt 186 lb 6 oz (84.5 kg)   SpO2 100%   BMI 33.01 kg/m   General: NAD Cardiovascular: RRR, no murmurs, no peripheral edema Respiratory: normal WOB on RA, CTAB, no wheezes, ronchi or rales Abdomen: soft, NTTP, no rebound or guarding, no flank pain Extremities: Moving all 4 extremities equally   ASSESSMENT/PLAN:   Type 2 diabetes mellitus with diabetic neuropathy, with long-term current use of insulin (HCC) Assessment & Plan: A1c today 5.9. Continue Semaglutide.  Orders: -     POCT glycosylated hemoglobin (Hb A1C) -     Microalbumin / creatinine urine ratio  Chronic kidney disease, stage 5 (HCC) Assessment & Plan: Patient has appointment with [facility] in late May. Will discuss hemo versus peritoneal dialysis. Discussed with patient that goal of making these decisions now is to avoid hospitalization. Continue lasix, spiro, losartan. Per chart review electrolytes and GFR stable from 4/16.   Breast cancer screening by mammogram -     3D Screening Mammogram, Left and Right; Future  Dysuria Assessment & Plan: Clinical history consistent with uncomplicated UTI. No fevers or flank pain to suggest complicated UTI. Urinalysis consistent with UTI. 7 day course of Keflex sent to patient's pharmacy. Return precautions discussed.  Urine culture sent as well, given second UTI this year.  Orders: -     POCT urinalysis dipstick -     Urine Culture -     Cephalexin; Take 1 capsule (500 mg total) by mouth 2 (two) times daily for 7 days.  Dispense: 14 capsule; Refill: 0  Hypertension associated with diabetes (HCC) Assessment & Plan: BP elevated to 162/69. Is not volume overloaded on exam. Patient did not take BP meds today. Instructed to take meds when patient returns home.    Return in about 1 month (around 10/18/2022).  Celine Mans, MD [facility]

## 2022-09-18 ENCOUNTER — Ambulatory Visit (INDEPENDENT_AMBULATORY_CARE_PROVIDER_SITE_OTHER): Payer: 59

## 2022-09-18 DIAGNOSIS — Z0000 Encounter for general adult medical examination without abnormal findings: Secondary | ICD-10-CM

## 2022-09-18 LAB — MICROALBUMIN / CREATININE URINE RATIO
Creatinine, Urine: 99.9 mg/dL
Microalb/Creat Ratio: 1455 mg/g creat — ABNORMAL HIGH (ref 0–29)
Microalbumin, Urine: 1453.4 ug/mL

## 2022-09-18 NOTE — Patient Instructions (Signed)

## 2022-09-18 NOTE — Progress Notes (Signed)
I connected with  Jacqualine Code on 09/18/22 by a audio enabled telemedicine application and verified that I am speaking with the correct person using two identifiers.  Patient Location: Home  Provider Location: Home Office  I discussed the limitations of evaluation and management by telemedicine. The patient expressed understanding and agreed to proceed.   Subjective:   Michelle Abbott is a 77 y.o. female who presents for Medicare Annual (Subsequent) preventive examination.  Review of Systems    Per HPI unless specifically indicated below. Cardiac Risk Factors include: advanced age (>47men, >32 women);female gender          Objective:       09/17/2022    3:02 PM 09/17/2022    2:10 PM 08/07/2022    9:15 AM  Vitals with BMI  Height  5\' 3"  5\' 3"   Weight  186 lbs 6 oz 193 lbs  BMI  33.02 34.2  Systolic 179 162 161  Diastolic 73 69 79  Pulse  72 69    Today's Vitals   09/18/22 1420  PainSc: 7    There is no height or weight on file to calculate BMI.     09/17/2022    2:10 PM 06/04/2022    7:31 PM 05/07/2022    1:54 PM 03/19/2022   11:36 AM 08/01/2021   10:48 AM 07/11/2021    1:44 PM 07/06/2021   12:09 PM  Advanced Directives  Does Patient Have a Medical Advance Directive? No No No No No No No  Would patient like information on creating a medical advance directive? No - Patient declined No - Patient declined No - Patient declined No - Patient declined No - Patient declined No - Patient declined No - Patient declined    Current Medications (verified) Outpatient Encounter Medications as of 09/18/2022  Medication Sig   ACCU-CHEK FASTCLIX LANCETS MISC TEST BLOOD SUGAR FOUR TIMES DAILY   acetaminophen (TYLENOL 8 HOUR) 650 MG CR tablet Take 1 tablet (650 mg total) by mouth every 8 (eight) hours as needed for pain.   allopurinol (ZYLOPRIM) 100 MG tablet Take 1 tablet (100 mg total) by mouth daily. (Patient taking differently: Take 100 mg by mouth daily. Taking twice per week)    atorvastatin (LIPITOR) 40 MG tablet Take 1 tablet (40 mg total) by mouth daily.   B-D ULTRAFINE III SHORT PEN 31G X 8 MM MISC Use to give insulin twice a day and victoza once a day. Dx code = E11.9   Blood Glucose Monitoring Suppl (ONETOUCH VERIO) w/Device KIT Use to check blood sugars 3x daily. E11.9   carvedilol (COREG) 25 MG tablet Take 25 mg by mouth 2 (two) times daily with a meal.   cephALEXin (KEFLEX) 500 MG capsule Take 1 capsule (500 mg total) by mouth 2 (two) times daily for 7 days.   cholecalciferol (VITAMIN D) 1000 units tablet Take 1,000 Units by mouth daily.   colchicine 0.6 MG tablet Take 2 tablets (1.2 mg total) at onset of gout flare then take 1 tablet (0.6 mg) one hour later if symptoms persist. (MAX: 1.8 mg total daily) (Patient taking differently: Take 0.6-1.2 mg by mouth See admin instructions. Take 2 tablets (1.2 mg total) at onset of gout flare then take 1 tablet (0.6 mg) one hour later if symptoms persist. (MAX: 1.8 mg total daily))   ferrous sulfate 325 (65 FE) MG tablet Take 325 mg by mouth daily.   furosemide (LASIX) 40 MG tablet Take 40 mg by mouth daily.  losartan (COZAAR) 100 MG tablet Take 1 tablet (100 mg total) by mouth daily.   Multiple Vitamin (MULTIVITAMIN WITH MINERALS) TABS tablet Take 1 tablet by mouth daily.   ONETOUCH ULTRA test strip USE TO TEST BLOOD SUGARS 3X DAILY. E11.9   oxybutynin (DITROPAN-XL) 5 MG 24 hr tablet Take 5 mg by mouth daily.   Semaglutide, 1 MG/DOSE, (OZEMPIC, 1 MG/DOSE,) 4 MG/3ML SOPN INJECT SUBCUTANEOUSLY 1 MG EVERY WEEK   spironolactone (ALDACTONE) 25 MG tablet Take 1 tablet (25 mg total) by mouth daily.   vitamin B-12 (CYANOCOBALAMIN) 1000 MCG tablet Take 1,000 mcg by mouth daily.    No facility-administered encounter medications on file as of 09/18/2022.    Allergies (verified) Ciprofloxacin hcl, Metformin and related, Ace inhibitors, Amlodipine, Baclofen, Ciprofloxacin, Feraheme [ferumoxytol], Metformin, and Lisinopril    History: Past Medical History:  Diagnosis Date   Acute kidney injury superimposed on chronic kidney disease (HCC) 04/28/2020   Anemia    Arthritis    Chronic kidney disease    Stage 4   Diabetes mellitus without complication (HCC)    type 2   Gout 2017   Hyperlipidemia    Hypertension    Hypertensive nephropathy 05/09/2021   Nephrotic range proteinuria 05/09/2021   Postoperative pain 04/29/2020   Rectal bleeding, History of 12/28/2019   Sciatica    Secondary hyperparathyroidism of renal origin (HCC) 05/09/2021   SI (sacroiliac) joint inflammation (HCC) 03/12/2019   UTI (urinary tract infection) 02/02/2019   Vitamin D deficiency 2014   Past Surgical History:  Procedure Laterality Date   BREAST SURGERY Right 1961   Benign lump removed   CATARACT EXTRACTION, BILATERAL Bilateral 2017   CESAREAN SECTION  1985   CESAREAN SECTION  1990   CHOLECYSTECTOMY  1999   COLONOSCOPY     DILATION AND CURETTAGE OF UTERUS  2017   KNEE ARTHROSCOPY Right    VAGINAL SEPTOPLASTY  1966   Family History  Problem Relation Age of Onset   Cancer Mother        ovarian and pancreatic   Diabetes Mother    Stroke Mother    COPD Father    Arthritis Father    Cancer Brother        Lung   COPD Brother    Social History   Socioeconomic History   Marital status: Widowed    Spouse name: Not on file   Number of children: 2   Years of education: 62   Highest education level: Some college, no degree  Occupational History   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 Daughter lives in same complex. Has grab bars in bathroom. Smoke alarms, one rug under coffee  table and at door.   No pets.   Recently moved from [place] in last two years.    Daughter helps with transportation.      Likes to eat meat, some vegetables, fruit. Drinks to drink sweet tea, some coffee. Get $15 in food stamps but runs out of food before end of month.   Enjoys shopping and fellowship. Writes poetry and plays. Word puzzles.   Social Determinants of Health   Financial Resource Strain: Low Risk  (09/18/2022)   Overall Financial Resource Strain (CARDIA)    Difficulty of Paying Living Expenses: Not hard at all  Food Insecurity: No Food Insecurity (09/18/2022)   Hunger Vital Sign    Worried About Running Out of Food in the Last Year: Never true    Ran Out of Food in the Last Year: Never true  Transportation Needs: No Transportation Needs (09/18/2022)   PRAPARE - Administrator, Civil Service (Medical): No    Lack of Transportation (Non-Medical): No  Physical Activity: Insufficiently Active (09/18/2022)   Exercise Vital Sign    Days of Exercise per Week: 1 day    Minutes of Exercise per Session: 60 min  Stress: Stress Concern Present (09/18/2022)   [employer] of Occupational Health - Occupational Stress Questionnaire    Feeling of Stress : To some extent  Social Connections: Moderately Integrated (09/18/2022)   Social Connection and Isolation Panel [NHANES]    Frequency of Communication with Friends and Family: More than three times a week    Frequency of Social Gatherings with Friends and Family: More than three times a week    Attends Religious Services: More than 4 times per year    Active Member of [employer] or Organizations: Yes    Attends Banker Meetings: More  than 4 times per year    Marital Status: Widowed    Tobacco Counseling Counseling given: No Tobacco comments: 1.5 - 2 ppd for 30 years:   no plans to re-start   Clinical Intake:  Pre-visit preparation completed: No  Pain : 0-10 Pain Score: 7  Pain Type: Acute pain Pain Location: Vagina Pain Descriptors / Indicators: Burning Pain Onset: In the past 7 days Pain Frequency: Intermittent     Nutritional Status: BMI > 30  Obese Nutritional Risks: None Diabetes: Yes CBG done?: Yes CBG resulted in Enter/ Edit results?: No Did pt. bring in CBG monitor from home?: No  How often do you need to have someone help you when you read instructions, pamphlets, or other written materials from your doctor or pharmacy?: 1 - Never  Diabetic?Nutrition Risk Assessment:  Has the patient had any N/V/D within the last 2 months?  No  Does the patient have any non-healing wounds?  No  Has the patient had any unintentional weight loss or weight gain?  No   Diabetes:  Is the patient diabetic?  Yes  If diabetic, was a CBG obtained today?  Yes  Did the patient bring in their glucometer from home?  No  How often do you monitor your CBG's? Daily .   Financial Strains and Diabetes Management:  Are you having any financial strains with the device, your supplies or your medication? No .  Does the patient want to be seen by Chronic Care Management for management of their diabetes?  No  Would the patient like to be referred to a Nutritionist or for Diabetic Management?  No   Diabetic Exams:  Diabetic Eye Exam: Overdue for diabetic  eye exam. Pt has been advised about the importance in completing this exam. Patient advised to call and schedule an eye exam. Diabetic Foot Exam: Overdue, Pt has been advised about the importance in completing this exam. Pt is scheduled for diabetic foot exam on at next diabetic exam .   Interpreter Needed?: No  Information entered by :: Laurel Dimmer,  CMA   Activities of Daily Living    09/18/2022    2:17 PM  In your present state of health, do you have any difficulty performing the following activities:  Hearing? 1  Vision? 1  Comment [facility]  Difficulty concentrating or making decisions? 0  Walking or climbing stairs? 1  Dressing or bathing? 0  Doing errands, shopping? 1    Patient Care Team: Celine Mans, MD as PCP - General (Family Medicine) Kathrin Ruddy, RPH-CPP (Pharmacist) Camille Bal, MD as Consulting Physician (Nephrology) [facility] (Optometry) Linna Darner, RD as Dietitian (Family Medicine)  Indicate any recent Medical Services you may have received from other than [facility] providers in the past year (date may be approximate).     Assessment:   This is a routine wellness examination for [place].  Hearing/Vision screen Age-related hearing loss. Complains of vision issues which is managed by her Retina Specialist & Opthalmology    Dietary issues and exercise activities discussed: Current Exercise Habits: Structured exercise class, Type of exercise: strength training/weights, Time (Minutes): 60, Frequency (Times/Week): 1, Weekly Exercise (Minutes/Week): 60, Intensity: Moderate, Exercise limited by: orthopedic condition(s)   Goals Addressed   None    Depression Screen    09/18/2022    2:16 PM 09/17/2022    2:09 PM 03/19/2022   11:38 AM 08/01/2021   10:47 AM 07/11/2021    1:44 PM 06/20/2021    1:45 PM 04/09/2021    1:57 PM  PHQ 2/9 Scores  PHQ - 2 Score 0 0 0 0 0 1 0  PHQ- 9 Score  0 3 1 2 3 3     Fall Risk    09/18/2022    2:16 PM 09/17/2022    2:09 PM 05/07/2022    1:51 PM 08/01/2021   10:47 AM 07/11/2021    1:44 PM  Fall Risk   Falls in the past year? 0 0 0 0 0  Number falls in past yr: 0 0  0 0  Injury with Fall? 0 0  0 0  Risk for fall due to : No Fall Risks      Follow up Falls evaluation completed        FALL RISK PREVENTION PERTAINING TO THE HOME:  Any  stairs in or around the home? No  If so, are there any without handrails? No  Home free of loose throw rugs in walkways, pet beds, electrical cords, etc? Yes  Adequate lighting in your home to reduce risk of falls? Yes   ASSISTIVE DEVICES UTILIZED TO PREVENT FALLS:  Life alert? No  Use of a cane, walker or w/c? Yes  Grab bars in the bathroom? Yes  Shower chair or bench in shower? Yes  Elevated toilet seat or a handicapped toilet? No   TIMED UP AND GO:  Was the test performed? Unable to perform, virtual appointment   Cognitive Function:    04/07/2018    2:04 PM  MMSE - Mini Mental State Exam  Orientation to time 5  Orientation to Place 5  Registration 3  Attention/ Calculation 5  Recall 3  Language- name 2 objects 2  Language- repeat 1  Language- follow 3 step command 3  Language- read & follow direction 1  Write a sentence 1  Copy design 1  Total score 30        09/18/2022    2:22 PM 04/07/2018    2:05 PM  6CIT Screen  What Year? 0 points 0 points  What month? 0 points 0 points  What time? 0 points 0 points  Count back from 20 0 points 0 points  Months in reverse 0 points 0 points  Repeat phrase 0 points 0 points  Total Score 0 points 0 points    Immunizations Immunization History  Administered Date(s) Administered   Fluad Quad(high Dose 65+) 03/09/2019, 05/02/2020, 06/20/2021, 03/19/2022   Influenza,inj,Quad PF,6+ Mos 03/28/2018   Influenza-Unspecified 03/10/2016, 05/06/2017   Moderna Sars-Covid-2 Vaccination 06/30/2019, 07/28/2019   Pneumococcal Conjugate-13 03/10/2016   Pneumococcal Polysaccharide-23 12/23/2017    TDAP status: Due, Education has been provided regarding the importance of this vaccine. Advised may receive this vaccine at local pharmacy or Health Dept. Aware to provide a copy of the vaccination record if obtained from local pharmacy or Health Dept. Verbalized acceptance and understanding.  Flu Vaccine status: Up to date  Pneumococcal  vaccine status: Up to date  Covid-19 vaccine status: Information provided on how to obtain vaccines.   Qualifies for Shingles Vaccine? Yes   Zostavax completed No   Shingrix Completed?: No.    Education has been provided regarding the importance of this vaccine. Patient has been advised to call insurance company to determine out of pocket expense if they have not yet received this vaccine. Advised may also receive vaccine at local pharmacy or Health Dept. Verbalized acceptance and understanding.  Screening Tests Health Maintenance  Topic Date Due   DTaP/Tdap/Td (1 - Tdap) Never done   Zoster Vaccines- Shingrix (1 of 2) Never done   OPHTHALMOLOGY EXAM  07/21/2018   FOOT EXAM  05/31/2021   COVID-19 Vaccine (3 - 2023-24 season) 01/16/2022   MAMMOGRAM  01/17/2022   Diabetic kidney evaluation - Urine ACR  04/30/2022   INFLUENZA VACCINE  12/17/2022   HEMOGLOBIN A1C  03/20/2023   Diabetic kidney evaluation - eGFR measurement  06/05/2023   Medicare Annual Wellness (AWV)  09/18/2023   COLONOSCOPY (Pts 45-60yrs Insurance coverage will need to be confirmed)  09/22/2027   Pneumonia Vaccine 87+ Years old  Completed   DEXA SCAN  Completed   Hepatitis C Screening  Completed   HPV VACCINES  Aged Out    Health Maintenance  Health Maintenance Due  Topic Date Due   DTaP/Tdap/Td (1 - Tdap) Never done   Zoster Vaccines- Shingrix (1 of 2) Never done   OPHTHALMOLOGY EXAM  07/21/2018   FOOT EXAM  05/31/2021   COVID-19 Vaccine (3 - 2023-24 season) 01/16/2022   MAMMOGRAM  01/17/2022   Diabetic kidney evaluation - Urine ACR  04/30/2022    Colorectal cancer screening: Type of screening: Colonoscopy. Completed 09/21/2017. Repeat every 10 years  Mammogram status: Ordered 09/17/2022. Pt provided with contact info and advised to call to schedule appt.   DEXA Scan: 10/17/2014  Lung Cancer Screening: (Low Dose CT Chest recommended if Age 54-80 years, 30 pack-year currently smoking OR have quit w/in  15years.) does not qualify.   Lung Cancer Screening Referral: not applicable   Additional Screening:  Hepatitis C Screening: does qualify; Completed 07/29/2017  Vision Screening: Recommended annual ophthalmology exams for early detection of glaucoma and other  disorders of the eye. Is the patient up to date with their annual eye exam?  No  Who is the provider or what is the name of the office in which the patient attends annual eye exams? [facility] If pt is not established with a provider, would they like to be referred to a provider to establish care? No .   Dental Screening: Recommended annual dental exams for proper oral hygiene  Community Resource Referral / Chronic Care Management: CRR required this visit?  No   CCM required this visit?  No      Plan:     I have personally reviewed and noted the following in the patient's chart:   Medical and social history Use of alcohol, tobacco or illicit drugs  Current medications and supplements including opioid prescriptions. Patient is not currently taking opioid prescriptions. Functional ability and status Nutritional status Physical activity Advanced directives List of other physicians Hospitalizations, surgeries, and ER visits in previous 12 months Vitals Screenings to include cognitive, depression, and falls Referrals and appointments  In addition, I have reviewed and discussed with patient certain preventive protocols, quality metrics, and best practice recommendations. A written personalized care plan for preventive services as well as general preventive health recommendations were provided to patient.    Ms. Leng , Thank you for taking time to come for your Medicare Wellness Visit. I appreciate your ongoing commitment to your health goals. Please review the following plan we discussed and let me know if I can assist you in the future.   These are the goals we discussed:  Goals       Blood Pressure <  140/90      Eat more fruits and vegetables (pt-stated)        This is a list of the screening recommended for you and due dates:  Health Maintenance  Topic Date Due   DTaP/Tdap/Td vaccine (1 - Tdap) Never done   Zoster (Shingles) Vaccine (1 of 2) Never done   Eye exam for diabetics  07/21/2018   Complete foot exam   05/31/2021   COVID-19 Vaccine (3 - 2023-24 season) 01/16/2022   Mammogram  01/17/2022   Yearly kidney health urinalysis for diabetes  04/30/2022   Flu Shot  12/17/2022   Hemoglobin A1C  03/20/2023   Yearly kidney function blood test for diabetes  06/05/2023   Medicare Annual Wellness Visit  09/18/2023   Colon Cancer Screening  09/22/2027   Pneumonia Vaccine  Completed   DEXA scan (bone density measurement)  Completed   Hepatitis C Screening: USPSTF Recommendation to screen - Ages 33-79 yo.  Completed   HPV Vaccine  Aged [address], [place]   09/18/2022   Nurse Notes: Approximately 30 minute Non-Face -To-Face Medicare Wellness Visit

## 2022-09-20 LAB — URINE CULTURE

## 2022-10-13 ENCOUNTER — Ambulatory Visit
Admission: RE | Admit: 2022-10-13 | Discharge: 2022-10-13 | Disposition: A | Discharge status: Home / Self Care | Payer: 59 | Source: Ambulatory Visit | Attending: Family Medicine | Admitting: Family Medicine

## 2022-10-13 DIAGNOSIS — Z1231 Encounter for screening mammogram for malignant neoplasm of breast: Secondary | ICD-10-CM | POA: Diagnosis not present

## 2022-10-15 ENCOUNTER — Encounter: Payer: Self-pay | Admitting: Family Medicine

## 2022-10-15 ENCOUNTER — Ambulatory Visit (INDEPENDENT_AMBULATORY_CARE_PROVIDER_SITE_OTHER): Payer: 59 | Admitting: Family Medicine

## 2022-10-15 VITALS — BP 136/62 | HR 63 | Wt 180.0 lb

## 2022-10-15 DIAGNOSIS — N898 Other specified noninflammatory disorders of vagina: Secondary | ICD-10-CM

## 2022-10-15 DIAGNOSIS — R8290 Unspecified abnormal findings in urine: Secondary | ICD-10-CM

## 2022-10-15 LAB — POCT UA - MICROSCOPIC ONLY

## 2022-10-15 LAB — POCT WET PREP (WET MOUNT)
Clue Cells Wet Prep Whiff POC: NEGATIVE
Trichomonas Wet Prep HPF POC: ABSENT

## 2022-10-15 LAB — POCT URINALYSIS DIP (MANUAL ENTRY)
Bilirubin, UA: NEGATIVE
Glucose, UA: NEGATIVE mg/dL
Ketones, POC UA: NEGATIVE mg/dL
Nitrite, UA: NEGATIVE
Protein Ur, POC: >=300 mg/dL — AB
Spec Grav, UA: 1.02 (ref 1.010–1.025)
Urobilinogen, UA: 0.2 E.U./dL
pH, UA: 6 (ref 5.0–8.0)

## 2022-10-15 MED ORDER — FLUCONAZOLE 50 MG PO TABS: 50.0000 mg | ORAL_TABLET | Freq: Every day | ORAL | 0 refills | Status: DC

## 2022-10-15 NOTE — Patient Instructions (Signed)
Your vaginal testing was negative, but it does seem like this could be related to a possible yeast especially since you had antibiotics later this month.  I am going to send in a low-dose of Diflucan (dosed like this because of your kidneys) that you can take to see if it helps his symptoms.  Sometimes yeast infections can also cause abnormal urine symptoms so I would like to treat this first and see if that helps improve your symptoms.  If you are continuing to have urinary discomfort over the weekend, then I would make sure to get an appointment Monday to have your urine retested.

## 2022-10-15 NOTE — Progress Notes (Signed)
    SUBJECTIVE:   CHIEF COMPLAINT / HPI:   Vaginal Discharge - Patient describes it as a yellow pudding - Also feels like urine smell is a bit stronger than usual - Doesn't have an odor and has had BV previously  Abnormal Urine Odor - Wants to have her urine tested - Had UTI at last visit - Doesn't have burning sensation but sometimes has discomfort - Has incontinence so has difficulty describing urgency   PERTINENT  PMH / PSH: T2DM, HTN, CKD stage 5  OBJECTIVE:   Wt 180 lb (81.6 kg)   BMI 31.89 kg/m   General: NAD, well-appearing, well-nourished Respiratory: No respiratory distress, breathing comfortably, able to speak in full sentences Skin: warm and dry, no rashes noted on exposed skin Psych: Appropriate affect and mood Pelvic exam: normal external genitalia, vulva, vagina, cervix, uterus and adnexa, exam chaperoned by Ruthy Dick, CMA.   ASSESSMENT/PLAN:   Vaginal discharge Wet prep testing was unremarkable, but given patient's presentation as well as the recent antibiotic use earlier this month, we will treat as if it is a yeast infection.  Given the ESRD, dose was adjusted for renal clearance. - Diflucan 50 mg x 1 dose  Abnormal urine odor Had urinary infection earlier this month that was positive for E. coli and treated with Keflex appropriately.  Current symptoms are more of an irritation and not true dysuria and no increased frequency, given the symptoms are not necessarily consistent with a true UTI and patient is possibly colonized due to the ESRD I am hesitant to do another round of antibiotics at this time.  Will treat the yeast infection as that could be contributing to irritation . - Return precautions if continued symptoms by Monday, consider repeat UA and culture    Evelena Leyden, DO [facility]

## 2022-10-23 ENCOUNTER — Ambulatory Visit: Payer: 59 | Admitting: Family Medicine

## 2022-10-23 DIAGNOSIS — H59031 Cystoid macular edema following cataract surgery, right eye: Secondary | ICD-10-CM | POA: Diagnosis not present

## 2022-10-23 NOTE — Progress Notes (Deleted)
    SUBJECTIVE:   CHIEF COMPLAINT / HPI: anemia follow-up  Hgb 7.4 last visit. Diet counseling with Dr. Brown. Here for further lab investigation. No new interim events. Michelle Abbott is doing well. Has been taking [employer] multivitamin.   PERTINENT  PMH / PSH: Anemia  OBJECTIVE:   BP (!) 111/62 Comment: on exam table  Pulse 98   Ht 3' 4.39" (1.026 m)   Wt 32 lb 2 oz (14.6 kg)   SpO2 99%   BMI 13.84 kg/m   General: NAD  HEENT: pale sclerae Cardiovascular: RRR, no murmurs, no peripheral edema Respiratory: normal WOB on RA, CTAB, no wheezes, ronchi or rales Abdomen: soft, NTTP, no rebound or guarding Extremities: Moving all 4 extremities equally   ASSESSMENT/PLAN:   Anemia, unspecified type Assessment & Plan: VSS, well appearing. Likely iron deficiency anemia. Will verify with ferritin and full CBC. Lead screening as well. If iron stores low will add iron supplementation. If not will schedule for lab visit for hgb electrophoresis and G6PD testing.  Orders: -     Ferritin -     CBC with Differential/Platelet  Need for lead screening -     Lead, Blood (Pediatric age 15 yrs or younger)  Elevated blood pressure reading Assessment & Plan: 96% percentile for age x2 in clinic. Will repeat at follow-up. Consider full primary versus secondary HTN work-up if still elevated at follow-up visit in 1 month.    Return in about 1 month (around 10/18/2022).  Ariyah Sedlack, MD [facility]   

## 2022-10-27 ENCOUNTER — Telehealth: Payer: Self-pay

## 2022-10-27 NOTE — Telephone Encounter (Signed)
Patient calls nurse line requesting another round of Diflucan.   She reports she was seen in office on 5/30 and was given one tab.   She reports the symptoms went away for a few days, however have returned. She is requesting one more tablet.  Will forward to provider who saw patient.

## 2022-10-28 ENCOUNTER — Other Ambulatory Visit: Payer: Self-pay | Admitting: Family Medicine

## 2022-10-28 MED ORDER — FLUCONAZOLE 50 MG PO TABS: 50.0000 mg | ORAL_TABLET | Freq: Once | ORAL | 0 refills | Status: AC

## 2022-10-28 NOTE — Telephone Encounter (Signed)
Patient contacted.   Patient advised of diflucan to the pharmacy.

## 2022-11-05 DIAGNOSIS — N189 Chronic kidney disease, unspecified: Secondary | ICD-10-CM | POA: Diagnosis not present

## 2022-11-05 DIAGNOSIS — N2581 Secondary hyperparathyroidism of renal origin: Secondary | ICD-10-CM | POA: Diagnosis not present

## 2022-11-05 DIAGNOSIS — N185 Chronic kidney disease, stage 5: Secondary | ICD-10-CM | POA: Diagnosis not present

## 2022-11-05 DIAGNOSIS — I120 Hypertensive chronic kidney disease with stage 5 chronic kidney disease or end stage renal disease: Secondary | ICD-10-CM | POA: Diagnosis not present

## 2022-11-05 DIAGNOSIS — D631 Anemia in chronic kidney disease: Secondary | ICD-10-CM | POA: Diagnosis not present

## 2022-11-10 ENCOUNTER — Other Ambulatory Visit: Payer: Self-pay | Admitting: Family Medicine

## 2022-11-10 DIAGNOSIS — E1159 Type 2 diabetes mellitus with other circulatory complications: Secondary | ICD-10-CM

## 2022-11-20 ENCOUNTER — Telehealth: Payer: Self-pay

## 2022-11-20 ENCOUNTER — Other Ambulatory Visit: Payer: Self-pay

## 2022-11-20 DIAGNOSIS — N185 Chronic kidney disease, stage 5: Secondary | ICD-10-CM

## 2022-11-20 NOTE — Telephone Encounter (Signed)
Received a call from patient requesting to schedule surgery. Reports she seen Dr. Allena Katz on 6/20 and based on her labs, he recommended patient to have AVG placed. Reviewed last OV note with Dr. Karin Lieu who discussed left arm AVG. Surgery scheduled for 7/23 per patient preference. Instructions provided and she voiced understanding.

## 2022-11-23 DIAGNOSIS — L603 Nail dystrophy: Secondary | ICD-10-CM | POA: Diagnosis not present

## 2022-11-23 DIAGNOSIS — E1142 Type 2 diabetes mellitus with diabetic polyneuropathy: Secondary | ICD-10-CM | POA: Diagnosis not present

## 2022-11-23 DIAGNOSIS — E1151 Type 2 diabetes mellitus with diabetic peripheral angiopathy without gangrene: Secondary | ICD-10-CM | POA: Diagnosis not present

## 2022-11-23 DIAGNOSIS — I739 Peripheral vascular disease, unspecified: Secondary | ICD-10-CM | POA: Diagnosis not present

## 2022-12-04 ENCOUNTER — Encounter (HOSPITAL_COMMUNITY): Payer: Self-pay | Admitting: Vascular Surgery

## 2022-12-04 ENCOUNTER — Other Ambulatory Visit: Payer: Self-pay

## 2022-12-04 DIAGNOSIS — E782 Mixed hyperlipidemia: Secondary | ICD-10-CM

## 2022-12-04 DIAGNOSIS — E1159 Type 2 diabetes mellitus with other circulatory complications: Secondary | ICD-10-CM

## 2022-12-04 DIAGNOSIS — M1A39X0 Chronic gout due to renal impairment, multiple sites, without tophus (tophi): Secondary | ICD-10-CM

## 2022-12-04 MED ORDER — LOSARTAN POTASSIUM 100 MG PO TABS: 100.0000 mg | ORAL_TABLET | Freq: Every day | ORAL | 0 refills | Status: DC

## 2022-12-04 MED ORDER — ALLOPURINOL 100 MG PO TABS: 100.0000 mg | ORAL_TABLET | ORAL | 0 refills | Status: DC

## 2022-12-04 MED ORDER — ATORVASTATIN CALCIUM 40 MG PO TABS: 40.0000 mg | ORAL_TABLET | Freq: Every day | ORAL | 3 refills | Status: DC

## 2022-12-04 NOTE — Progress Notes (Signed)
SDW call  Patient was given pre-op instructions over the phone. Patient verbalized understanding of instructions provided.     PCP - Dr. Celine Mans Cardiologist - denies Pulmonary: denies   PPM/ICD - denies   Chest x-ray - 06/04/2022  EKG -  06/08/2022 Stress Test - ECHO -  Cardiac Cath -   Sleep Study/sleep apnea/CPAP: denies  Type II diabetic Fasting Blood sugar range: 109-112 How often check sugars: daily Ozempic, states last dose 11/25/2022   Blood Thinner Instructions: denies Aspirin Instructions:denies   ERAS Protcol - No, NPO PRE-SURGERY Ensure or G2-    COVID TEST- n/a    Anesthesia review: Yes. HTN, DM, CKD    Patient denies shortness of breath, fever, cough and chest pain over the phone call  Your procedure is scheduled on Tuesday December 08, 2022  Report to [facility] Main Entrance "A" at  0530  A.M., then check in with the Admitting office.  Call this number if you have problems the morning of surgery:  [phone]   If you have any questions prior to your surgery date call [phone]: Open Monday-Friday 8am-4pm If you experience any cold or flu symptoms such as cough, fever, chills, shortness of breath, etc. between now and your scheduled surgery, please notify us at the above number    Remember:   Do not eat or drink  after midnight the night before your surgery   Take these medicines the morning of surgery with A SIP OF WATER:  Atorvastatin, coreg, ditropan  As needed: tylenol  As of today, STOP taking any Aspirin (unless otherwise instructed by your surgeon) Aleve, Naproxen, Ibuprofen, Motrin, Advil, Goody's, BC's, all herbal medications, fish oil, and all vitamins.

## 2022-12-07 NOTE — Anesthesia Preprocedure Evaluation (Addendum)
Anesthesia Evaluation  Patient identified by MRN, date of birth, ID band Patient awake    Reviewed: Allergy & Precautions, NPO status , Patient's Chart, lab work & pertinent test results, reviewed documented beta blocker date and time   History of Anesthesia Complications Negative for: history of anesthetic complications  Airway Mallampati: II  TM Distance: >3 FB Neck ROM: Full    Dental  (+) Edentulous Upper, Edentulous Lower   Pulmonary former smoker   Pulmonary exam normal        Cardiovascular hypertension, Pt. on medications and Pt. on home beta blockers Normal cardiovascular exam     Neuro/Psych negative neurological ROS  negative psych ROS   GI/Hepatic Neg liver ROS,GERD  ,,  Endo/Other  diabetes, Type 2, Insulin Dependent  Morbid obesity  Renal/GU Renal disease (CKD4, Cr 2.58)  negative genitourinary   Musculoskeletal  (+) Arthritis , Osteoarthritis,    Abdominal  (+) + obese  Peds  Hematology  (+) Blood dyscrasia, anemia Hgb 9.5   Anesthesia Other Findings  TTE 03/01/14: 1. Left ventricle: The cavity size was normal. Wall thickness was mildly increased in a pattern of concentric left ventricular hypertrophy: Images were inadequate for LV wall motion assessment.  2. Right ventricle: Poorly visualized but grossly normal. 3. Pulmonary arteries: Systolic pressure could not be accurately estimated.  Impressions: Date of prior study: 02/16/2007 Probably no significant change.   Reproductive/Obstetrics                             Anesthesia Physical Anesthesia Plan  ASA: 4  Anesthesia Plan: General   Post-op Pain Management: Minimal or no pain anticipated   Induction: Intravenous  PONV Risk Score and Plan: 3 and Ondansetron, Dexamethasone, Treatment may vary due to age or medical condition and Midazolam  Airway Management Planned: LMA  Additional Equipment: None  Intra-op  Plan:   Post-operative Plan: Extubation in OR  Informed Consent: I have reviewed the patients History and Physical, chart, labs and discussed the procedure including the risks, benefits and alternatives for the proposed anesthesia with the patient or authorized representative who has indicated his/her understanding and acceptance.     Dental advisory given  Plan Discussed with:   Anesthesia Plan Comments: (PAT note written 12/07/2022 by Shonna Chock, PA-C.  )        Anesthesia Quick Evaluation

## 2022-12-07 NOTE — Progress Notes (Signed)
Anesthesia Chart Review: Michelle Abbott  Case: [id number] Date/Time: 12/08/22 0715   Procedure: INSERTION OF LEFT ARM ARTERIOVENOUS (AV) GORE-TEX GRAFT (Left)   Anesthesia type: Monitor Anesthesia Care   Pre-op diagnosis: CKD V   Location: [facility] ROOM 11 / [facility]   Surgeons: Victorino Sparrow, MD       DISCUSSION: Patient is a 77 year old female scheduled for the above procedure.  History includes former smoker (quit 05/18/97), HTN, HLD, DM2, CKD (stage V), anemia, GERD.   Reported last Ozempic 11/25/22.   She is a same day work-up, so labs and anesthesia team evaluation on the day of surgery.   VS: Ht 5\' 3"  (1.6 m)   Wt 82.6 kg   BMI 32.24 kg/m  BP Readings from Last 3 Encounters:  10/15/22 136/62  09/17/22 (!) 179/73  08/07/22 (!) 185/79   Pulse Readings from Last 3 Encounters:  10/15/22 63  09/17/22 72  08/07/22 69     PROVIDERS: Celine Mans, MD is PCP  Zetta Bills, MD is nephrologist   LABS: For day of surgery. Most recent results in [facility] include: Lab Results  Component Value Date   WBC 6.2 06/04/2022   HGB 9.7 (L) 06/04/2022   HCT 33.4 (L) 06/04/2022   PLT 243 06/04/2022   GLUCOSE 100 (H) 06/04/2022   ALT 12 06/04/2022   AST 14 (L) 06/04/2022   NA 139 06/04/2022   K 4.6 06/04/2022   CL 113 (H) 06/04/2022   CREATININE 4.35 (H) 06/04/2022   BUN 57 (H) 06/04/2022   CO2 22 06/04/2022   INR 1.1 06/04/2022   HGBA1C 5.9 09/17/2022    IMAGES: 1V PCXR 06/04/22: FINDINGS: The heart size and mediastinal contours are within normal limits. Both lungs are clear. The visualized skeletal structures are unremarkable. IMPRESSION: No active disease.    EKG: 06/04/22:  Sinus rhythm Borderline left axis deviation Consider anterior infarct Confirmed by Gilda Crease [phone]) on 06/04/2022 11:31:47 PM   CV: 1V PCXR 06/04/22: FINDINGS: The heart size and mediastinal contours are within normal limits. Both lungs are clear. The visualized skeletal  structures are unremarkable. IMPRESSION: No active disease.     TTE 03/01/14: 1. Left ventricle: The cavity size was normal. Wall thickness was mildly increased in a pattern of concentric left ventricular hypertrophy. Systolic function was normal. The estimated ejection fraction was in the range of 55% to 65%. Images were inadequate for LV wall motion assessment. There was Grade I (mild) diastolic dysfunction. 2. Right ventricle: Poorly visualized but grossly normal. 3. Pulmonary arteries: Systolic pressure could not be accurately estimated.  Impressions: Date of prior study: 02/16/2007 Probably no significant change.     Past Medical History:  Diagnosis Date   Acute kidney injury superimposed on chronic kidney disease (HCC) 04/28/2020   Anemia    Arthritis    Chronic kidney disease    Stage 4   Diabetes mellitus without complication (HCC)    type 2   GERD (gastroesophageal reflux disease)    Gout 2017   Hyperlipidemia    Hypertension    Hypertensive nephropathy 05/09/2021   Nephrotic range proteinuria 05/09/2021   Postoperative pain 04/29/2020   Rectal bleeding, History of 12/28/2019   Sciatica    Secondary hyperparathyroidism of renal origin (HCC) 05/09/2021   SI (sacroiliac) joint inflammation (HCC) 03/12/2019   UTI (urinary tract infection) 02/02/2019   Vitamin D deficiency 2014    Past Surgical History:  Procedure Laterality Date   BREAST  SURGERY Right 1961   Benign lump removed   CATARACT EXTRACTION, BILATERAL Bilateral 2017   CESAREAN SECTION  1985   CESAREAN SECTION  1990   CHOLECYSTECTOMY  1999   COLONOSCOPY     DILATION AND CURETTAGE OF UTERUS  2017   KNEE ARTHROSCOPY Right    VAGINAL SEPTOPLASTY  1966    MEDICATIONS: No current facility-administered medications for this encounter.    acetaminophen (TYLENOL 8 HOUR) 650 MG CR tablet   carvedilol (COREG) 25 MG tablet   Cholecalciferol (VITAMIN D) 125 MCG (5000 UT) CAPS   colchicine 0.6 MG tablet    Cyanocobalamin (VITAMIN B-12) 2500 MCG SUBL   ferrous sulfate 325 (65 FE) MG tablet   furosemide (LASIX) 40 MG tablet   Multiple Vitamin (MULTIVITAMIN WITH MINERALS) TABS tablet   oxybutynin (DITROPAN XL) 15 MG 24 hr tablet   Semaglutide, 1 MG/DOSE, (OZEMPIC, 1 MG/DOSE,) 4 MG/3ML SOPN   spironolactone (ALDACTONE) 25 MG tablet   ACCU-CHEK FASTCLIX LANCETS MISC   allopurinol (ZYLOPRIM) 100 MG tablet   atorvastatin (LIPITOR) 40 MG tablet   B-D ULTRAFINE III SHORT PEN 31G X 8 MM MISC   Blood Glucose Monitoring Suppl (ONETOUCH VERIO) w/Device KIT   losartan (COZAAR) 100 MG tablet   ONETOUCH ULTRA test strip    Shonna Chock, PA-C Surgical Short Stay/Anesthesiology [facility] Phone [phone] [facility] Phone [phone] 12/07/2022 11:42 AM

## 2022-12-08 ENCOUNTER — Encounter (HOSPITAL_COMMUNITY)
Admission: RE | Disposition: A | Discharge status: Home / Self Care | Payer: Self-pay | Source: Home / Self Care | Attending: Vascular Surgery

## 2022-12-08 ENCOUNTER — Ambulatory Visit (HOSPITAL_BASED_OUTPATIENT_CLINIC_OR_DEPARTMENT_OTHER): Payer: 59 | Admitting: Vascular Surgery

## 2022-12-08 ENCOUNTER — Encounter (HOSPITAL_COMMUNITY): Payer: Self-pay | Admitting: Vascular Surgery

## 2022-12-08 ENCOUNTER — Ambulatory Visit (HOSPITAL_COMMUNITY)
Admission: RE | Admit: 2022-12-08 | Discharge: 2022-12-08 | Disposition: A | Discharge status: Home / Self Care | Payer: 59 | Source: Home / Self Care | Attending: Vascular Surgery | Admitting: Vascular Surgery

## 2022-12-08 ENCOUNTER — Ambulatory Visit (HOSPITAL_COMMUNITY): Payer: 59 | Admitting: Vascular Surgery

## 2022-12-08 ENCOUNTER — Other Ambulatory Visit: Payer: Self-pay

## 2022-12-08 DIAGNOSIS — Z7985 Long-term (current) use of injectable non-insulin antidiabetic drugs: Secondary | ICD-10-CM | POA: Diagnosis not present

## 2022-12-08 DIAGNOSIS — N186 End stage renal disease: Secondary | ICD-10-CM

## 2022-12-08 DIAGNOSIS — D631 Anemia in chronic kidney disease: Secondary | ICD-10-CM | POA: Diagnosis not present

## 2022-12-08 DIAGNOSIS — M1990 Unspecified osteoarthritis, unspecified site: Secondary | ICD-10-CM | POA: Insufficient documentation

## 2022-12-08 DIAGNOSIS — Z79899 Other long term (current) drug therapy: Secondary | ICD-10-CM | POA: Diagnosis not present

## 2022-12-08 DIAGNOSIS — I1311 Hypertensive heart and chronic kidney disease without heart failure, with stage 5 chronic kidney disease, or end stage renal disease: Secondary | ICD-10-CM | POA: Insufficient documentation

## 2022-12-08 DIAGNOSIS — N185 Chronic kidney disease, stage 5: Secondary | ICD-10-CM | POA: Insufficient documentation

## 2022-12-08 DIAGNOSIS — I120 Hypertensive chronic kidney disease with stage 5 chronic kidney disease or end stage renal disease: Secondary | ICD-10-CM

## 2022-12-08 DIAGNOSIS — E1122 Type 2 diabetes mellitus with diabetic chronic kidney disease: Secondary | ICD-10-CM | POA: Diagnosis not present

## 2022-12-08 DIAGNOSIS — Z87891 Personal history of nicotine dependence: Secondary | ICD-10-CM | POA: Insufficient documentation

## 2022-12-08 DIAGNOSIS — E785 Hyperlipidemia, unspecified: Secondary | ICD-10-CM | POA: Diagnosis not present

## 2022-12-08 DIAGNOSIS — Z6832 Body mass index (BMI) 32.0-32.9, adult: Secondary | ICD-10-CM | POA: Insufficient documentation

## 2022-12-08 DIAGNOSIS — Z992 Dependence on renal dialysis: Secondary | ICD-10-CM | POA: Diagnosis not present

## 2022-12-08 HISTORY — PX: AV FISTULA PLACEMENT: SHX1204

## 2022-12-08 HISTORY — DX: Gastro-esophageal reflux disease without esophagitis: K21.9

## 2022-12-08 LAB — POCT I-STAT, CHEM 8
BUN: 97 mg/dL — ABNORMAL HIGH (ref 8–23)
Calcium, Ion: 1.22 mmol/L (ref 1.15–1.40)
Chloride: 111 mmol/L (ref 98–111)
Creatinine, Ser: 6.7 mg/dL — ABNORMAL HIGH (ref 0.44–1.00)
Glucose, Bld: 102 mg/dL — ABNORMAL HIGH (ref 70–99)
HCT: 30 % — ABNORMAL LOW (ref 36.0–46.0)
Hemoglobin: 10.2 g/dL — ABNORMAL LOW (ref 12.0–15.0)
Potassium: 4.9 mmol/L (ref 3.5–5.1)
Sodium: 140 mmol/L (ref 135–145)
TCO2: 18 mmol/L — ABNORMAL LOW (ref 22–32)

## 2022-12-08 LAB — GLUCOSE, CAPILLARY: Glucose-Capillary: 107 mg/dL — ABNORMAL HIGH (ref 70–99)

## 2022-12-08 SURGERY — INSERTION OF ARTERIOVENOUS (AV) GORE-TEX GRAFT ARM
Anesthesia: Monitor Anesthesia Care | Site: Arm Upper | Laterality: Left

## 2022-12-08 MED ORDER — HEPARIN 6000 UNIT IRRIGATION SOLUTION
Status: AC
  Filled 2022-12-08: qty 500

## 2022-12-08 MED ORDER — PROMETHAZINE HCL 25 MG/ML IJ SOLN: 6.2500 mg | INTRAMUSCULAR | Status: DC | PRN

## 2022-12-08 MED ORDER — LIDOCAINE HCL (PF) 1 % IJ SOLN
INTRAMUSCULAR | Status: AC
  Filled 2022-12-08: qty 30

## 2022-12-08 MED ORDER — SODIUM CHLORIDE 0.9 % IV SOLN: INTRAVENOUS | Status: DC

## 2022-12-08 MED ORDER — PROPOFOL 10 MG/ML IV BOLUS
INTRAVENOUS | Status: DC | PRN
  Administered 2022-12-08: 100 mg via INTRAVENOUS

## 2022-12-08 MED ORDER — 0.9 % SODIUM CHLORIDE (POUR BTL) OPTIME
TOPICAL | Status: DC | PRN
  Administered 2022-12-08: 1000 mL

## 2022-12-08 MED ORDER — CHLORHEXIDINE GLUCONATE 0.12 % MT SOLN
15.0000 mL | Freq: Once | OROMUCOSAL | Status: AC
  Administered 2022-12-08: 15 mL via OROMUCOSAL
  Filled 2022-12-08: qty 15

## 2022-12-08 MED ORDER — MIDAZOLAM HCL 2 MG/2ML IJ SOLN
INTRAMUSCULAR | Status: AC
  Filled 2022-12-08: qty 2

## 2022-12-08 MED ORDER — DEXAMETHASONE SODIUM PHOSPHATE 10 MG/ML IJ SOLN
INTRAMUSCULAR | Status: AC
  Filled 2022-12-08: qty 1

## 2022-12-08 MED ORDER — OXYCODONE HCL 5 MG PO TABS
5.0000 mg | ORAL_TABLET | Freq: Once | ORAL | Status: AC | PRN
  Administered 2022-12-08: 5 mg via ORAL

## 2022-12-08 MED ORDER — OXYCODONE HCL 5 MG PO TABS
ORAL_TABLET | ORAL | Status: AC
  Filled 2022-12-08: qty 1

## 2022-12-08 MED ORDER — LIDOCAINE 2% (20 MG/ML) 5 ML SYRINGE
INTRAMUSCULAR | Status: DC | PRN
  Administered 2022-12-08: 40 mg via INTRAVENOUS

## 2022-12-08 MED ORDER — HEPARIN 6000 UNIT IRRIGATION SOLUTION
Status: DC | PRN
  Administered 2022-12-08: 1

## 2022-12-08 MED ORDER — LIDOCAINE 2% (20 MG/ML) 5 ML SYRINGE
INTRAMUSCULAR | Status: AC
  Filled 2022-12-08: qty 5

## 2022-12-08 MED ORDER — ONDANSETRON HCL 4 MG/2ML IJ SOLN
INTRAMUSCULAR | Status: AC
  Filled 2022-12-08: qty 2

## 2022-12-08 MED ORDER — HYDROMORPHONE HCL 1 MG/ML IJ SOLN: 0.2500 mg | INTRAMUSCULAR | Status: DC | PRN

## 2022-12-08 MED ORDER — CHLORHEXIDINE GLUCONATE 4 % EX SOLN: 60.0000 mL | Freq: Once | CUTANEOUS | Status: DC

## 2022-12-08 MED ORDER — PROPOFOL 10 MG/ML IV BOLUS
INTRAVENOUS | Status: AC
  Filled 2022-12-08: qty 20

## 2022-12-08 MED ORDER — PROPOFOL 500 MG/50ML IV EMUL
INTRAVENOUS | Status: DC | PRN
  Administered 2022-12-08: 5 ug/kg/min via INTRAVENOUS

## 2022-12-08 MED ORDER — CEFAZOLIN SODIUM-DEXTROSE 2-4 GM/100ML-% IV SOLN
2.0000 g | INTRAVENOUS | Status: AC
  Administered 2022-12-08: 2 g via INTRAVENOUS
  Filled 2022-12-08: qty 100

## 2022-12-08 MED ORDER — PHENYLEPHRINE HCL-NACL 20-0.9 MG/250ML-% IV SOLN
INTRAVENOUS | Status: DC | PRN
  Administered 2022-12-08: 20 ug/min via INTRAVENOUS

## 2022-12-08 MED ORDER — FENTANYL CITRATE (PF) 250 MCG/5ML IJ SOLN
INTRAMUSCULAR | Status: DC | PRN
  Administered 2022-12-08 (×2): 25 ug via INTRAVENOUS

## 2022-12-08 MED ORDER — DEXAMETHASONE SODIUM PHOSPHATE 10 MG/ML IJ SOLN
INTRAMUSCULAR | Status: DC | PRN
  Administered 2022-12-08: 4 mg via INTRAVENOUS

## 2022-12-08 MED ORDER — FENTANYL CITRATE (PF) 250 MCG/5ML IJ SOLN
INTRAMUSCULAR | Status: AC
  Filled 2022-12-08: qty 5

## 2022-12-08 MED ORDER — ONDANSETRON HCL 4 MG/2ML IJ SOLN
INTRAMUSCULAR | Status: DC | PRN
  Administered 2022-12-08: 4 mg via INTRAVENOUS

## 2022-12-08 MED ORDER — OXYCODONE HCL 5 MG/5ML PO SOLN: 5.0000 mg | Freq: Once | ORAL | Status: AC | PRN

## 2022-12-08 MED ORDER — TRAMADOL HCL 50 MG PO TABS: 50.0000 mg | ORAL_TABLET | Freq: Four times a day (QID) | ORAL | 0 refills | Status: DC | PRN

## 2022-12-08 MED ORDER — HEPARIN SODIUM (PORCINE) 1000 UNIT/ML IJ SOLN
INTRAMUSCULAR | Status: DC | PRN
  Administered 2022-12-08: 2000 [IU] via INTRAVENOUS

## 2022-12-08 MED ORDER — ORAL CARE MOUTH RINSE: 15.0000 mL | Freq: Once | OROMUCOSAL | Status: AC

## 2022-12-08 SURGICAL SUPPLY — 38 items
ADH SKN CLS APL DERMABOND .7 (GAUZE/BANDAGES/DRESSINGS) ×1
ARMBAND PINK RESTRICT EXTREMIT (MISCELLANEOUS) ×1 IMPLANT
BAG COUNTER SPONGE SURGICOUNT (BAG) ×1 IMPLANT
BAG SPNG CNTER NS LX DISP (BAG) ×1
BLADE CLIPPER SURG (BLADE) ×1 IMPLANT
BNDG ELASTIC 4X5.8 VLCR STR LF (GAUZE/BANDAGES/DRESSINGS) ×1 IMPLANT
CANISTER SUCT 3000ML PPV (MISCELLANEOUS) ×1 IMPLANT
CLIP TI MEDIUM 24 (CLIP) ×1 IMPLANT
CLIP TI MEDIUM 6 (CLIP) ×2 IMPLANT
CLIP TI WIDE RED SMALL 24 (CLIP) ×1 IMPLANT
CLIP TI WIDE RED SMALL 6 (CLIP) ×1 IMPLANT
COVER PROBE W GEL 5X96 (DRAPES) ×1 IMPLANT
DERMABOND ADVANCED .7 DNX12 (GAUZE/BANDAGES/DRESSINGS) ×1 IMPLANT
ELECT REM PT RETURN 9FT ADLT (ELECTROSURGICAL) ×1
ELECTRODE REM PT RTRN 9FT ADLT (ELECTROSURGICAL) ×1 IMPLANT
GLOVE BIOGEL PI IND STRL 8 (GLOVE) ×1 IMPLANT
GOWN STRL REUS W/ TWL LRG LVL3 (GOWN DISPOSABLE) ×2 IMPLANT
GOWN STRL REUS W/TWL 2XL LVL3 (GOWN DISPOSABLE) ×2 IMPLANT
GOWN STRL REUS W/TWL LRG LVL3 (GOWN DISPOSABLE) ×3
KIT BASIN OR (CUSTOM PROCEDURE TRAY) ×1 IMPLANT
KIT TURNOVER KIT B (KITS) ×1 IMPLANT
NS IRRIG 1000ML POUR BTL (IV SOLUTION) ×1 IMPLANT
PACK CV ACCESS (CUSTOM PROCEDURE TRAY) ×1 IMPLANT
PAD ARMBOARD 7.5X6 YLW CONV (MISCELLANEOUS) ×2 IMPLANT
SLING ARM FOAM STRAP LRG (SOFTGOODS) IMPLANT
SLING ARM FOAM STRAP MED (SOFTGOODS) IMPLANT
SPIKE FLUID TRANSFER (MISCELLANEOUS) ×1 IMPLANT
SUT GORETEX 6.0 TT13 (SUTURE) IMPLANT
SUT MNCRL AB 4-0 PS2 18 (SUTURE) ×1 IMPLANT
SUT PROLENE 6 0 BV (SUTURE) ×1 IMPLANT
SUT PROLENE 7 0 BV 1 (SUTURE) IMPLANT
SUT SILK 2 0 PERMA HAND 18 BK (SUTURE) IMPLANT
SUT SILK 3 0 SH CR/8 (SUTURE) ×1 IMPLANT
SUT VIC AB 3-0 SH 27 (SUTURE) ×3
SUT VIC AB 3-0 SH 27X BRD (SUTURE) ×2 IMPLANT
TOWEL GREEN STERILE (TOWEL DISPOSABLE) ×1 IMPLANT
UNDERPAD 30X36 HEAVY ABSORB (UNDERPADS AND DIAPERS) ×1 IMPLANT
WATER STERILE IRR 1000ML POUR (IV SOLUTION) ×1 IMPLANT

## 2022-12-08 NOTE — H&P (Signed)
Office Note    CC:  ESRD Requesting Provider:  No ref. provider found  HPI: Michelle Abbott is a Right handed 77 y.o. (1945/10/25) female with kidney disease who presents for fistula v AV graft at the request of No ref. provider found for permanent HD access. The patient has had no prior access procedures.  No tunneled lines.  CKD 5.  On exam, Michelle Abbott was doing well, she was not happy about her chronic kidney disease. Originally from [place], she moved to [place] to be closer to her daughter and grandson, who is 28. She continues to live independently, and is currently CKD 5.  She presented with her daughter to preop - no new medical issues.     Past Medical History:  Diagnosis Date   Acute kidney injury superimposed on chronic kidney disease (HCC) 04/28/2020   Anemia    Arthritis    Chronic kidney disease    Stage 4   Diabetes mellitus without complication (HCC)    type 2   GERD (gastroesophageal reflux disease)    Gout 2017   Hyperlipidemia    Hypertension    Hypertensive nephropathy 05/09/2021   Nephrotic range proteinuria 05/09/2021   Postoperative pain 04/29/2020   Rectal bleeding, History of 12/28/2019   Sciatica    Secondary hyperparathyroidism of renal origin (HCC) 05/09/2021   SI (sacroiliac) joint inflammation (HCC) 03/12/2019   UTI (urinary tract infection) 02/02/2019   Vitamin D deficiency 2014    Past Surgical History:  Procedure Laterality Date   BREAST SURGERY Right 1961   Benign lump removed   CATARACT EXTRACTION, BILATERAL Bilateral 2017   CESAREAN SECTION  1985   CESAREAN SECTION  1990   CHOLECYSTECTOMY  1999   COLONOSCOPY     DILATION AND CURETTAGE OF UTERUS  2017   KNEE ARTHROSCOPY Right    VAGINAL SEPTOPLASTY  1966    Social History   Socioeconomic History   Marital status: Widowed    Spouse name: Not on file   Number of children: 2   Years of education: 25   Highest education level: Some college, no degree  Occupational History    Occupation: Secondary school teacher     Comment: Developmentally disabled persons   Occupation: Retired  Tobacco Use   Smoking status: Former    Current packs/day: 0.00    Average packs/day: 1.5 packs/day for 30.0 years (45.0 ttl pk-yrs)    Types: Cigarettes    Start date: 05/19/1967    Quit date: 05/18/1997    Years since quitting: 25.5    Passive exposure: Past   Smokeless tobacco: Never   Tobacco comments:    1.5 - 2 ppd for 30 years:   no plans to re-start  Vaping Use   Vaping status: Never Used  Substance and Sexual Activity   Alcohol use: No   Drug use: No   Sexual activity: Not Currently  Other Topics Concern   Not on file  Social History Narrative   Lives by herself in one level apt. Daughter lives in same complex. Has grab bars in bathroom. Smoke alarms, one rug under coffee table and at door.   No pets.   Recently moved from [place] in last two years.    Daughter helps with transportation.      Likes to eat meat, some vegetables, fruit. Drinks to drink sweet tea, some coffee. Get $15 in food stamps but runs out of food before end of month.   Enjoys shopping and fellowship. Writes poetry and  plays. Word puzzles.   Social Determinants of Health   Financial Resource Strain: Low Risk  (09/18/2022)   Overall Financial Resource Strain (CARDIA)    Difficulty of Paying Living Expenses: Not hard at all  Food Insecurity: No Food Insecurity (09/18/2022)   Hunger Vital Sign    Worried About Running Out of Food in the Last Year: Never true    Ran Out of Food in the Last Year: Never true  Transportation Needs: No Transportation Needs (09/18/2022)   PRAPARE - Administrator, Civil Service (Medical): No    Lack of Transportation (Non-Medical): No  Physical Activity: Insufficiently Active (09/18/2022)   Exercise Vital Sign    Days of Exercise per Week: 1 day    Minutes of Exercise per Session: 60 min  Stress: Stress Concern Present (09/18/2022)   [employer] of Occupational Health -  Occupational Stress Questionnaire    Feeling of Stress : To some extent  Social Connections: Moderately Integrated (09/18/2022)   Social Connection and Isolation Panel [NHANES]    Frequency of Communication with Friends and Family: More than three times a week    Frequency of Social Gatherings with Friends and Family: More than three times a week    Attends Religious Services: More than 4 times per year    Active Member of [employer] or Organizations: Yes    Attends Banker Meetings: More than 4 times per year    Marital Status: Widowed  Intimate Partner Violence: Not At Risk (04/07/2018)   Humiliation, Afraid, Rape, and Kick questionnaire    Fear of Current or Ex-Partner: No    Emotionally Abused: No    Physically Abused: No    Sexually Abused: No   Family History  Problem Relation Age of Onset   Cancer Mother        ovarian and pancreatic   Diabetes Mother    Stroke Mother    COPD Father    Arthritis Father    Cancer Brother        Lung   COPD Brother     Current Facility-Administered Medications  Medication Dose Route Frequency Provider Last Rate Last Admin   0.9 %  sodium chloride infusion   Intravenous Continuous Victorino Sparrow, MD       0.9 %  sodium chloride infusion   Intravenous Continuous Lowella Curb, MD 10 mL/hr at 12/08/22 0701 New Bag at 12/08/22 0701   0.9 % irrigation (POUR BTL)    PRN Victorino Sparrow, MD   1,000 mL at 12/08/22 0723   ceFAZolin (ANCEF) IVPB 2g/100 mL premix  2 g Intravenous 30 min Pre-Op Victorino Sparrow, MD       chlorhexidine (HIBICLENS) 4 % liquid 4 Application  60 mL Topical Once Victorino Sparrow, MD       And   [START ON 12/09/2022] chlorhexidine (HIBICLENS) 4 % liquid 4 Application  60 mL Topical Once Victorino Sparrow, MD        Allergies  Allergen Reactions   Ciprofloxacin Hcl Other (See Comments)    Burning at IV site   Metformin And Related Other (See Comments)    CKD    Ace Inhibitors Cough    Lisinopril    Amlodipine Swelling    Lower leg swelling.    Baclofen Nausea Only and Other (See Comments)    Disoriented, dizziness, weakness, fatigue.    Feraheme [Ferumoxytol] Other (See Comments)    Chest pain  Lisinopril Palpitations     REVIEW OF SYSTEMS:  [X]  denotes positive finding, [ ]  denotes negative finding Cardiac  Comments:  Chest pain or chest pressure:    Shortness of breath upon exertion:    Short of breath when lying flat:    Irregular heart rhythm:        Vascular    Pain in calf, thigh, or hip brought on by ambulation:    Pain in feet at night that wakes you up from your sleep:     Blood clot in your veins:    Leg swelling:         Pulmonary    Oxygen at home:    Productive cough:     Wheezing:         Neurologic    Sudden weakness in arms or legs:     Sudden numbness in arms or legs:     Sudden onset of difficulty speaking or slurred speech:    Temporary loss of vision in one eye:     Problems with dizziness:         Gastrointestinal    Blood in stool:     Vomited blood:         Genitourinary    Burning when urinating:     Blood in urine:        Psychiatric    Major depression:         Hematologic    Bleeding problems:    Problems with blood clotting too easily:        Skin    Rashes or ulcers:        Constitutional    Fever or chills:      PHYSICAL EXAMINATION:  Vitals:   12/04/22 1642 12/08/22 0636  BP:  (!) 151/66  Pulse:  73  Resp:  18  Temp:  97.9 F (36.6 C)  SpO2:  100%  Weight: 82.6 kg 82.6 kg  Height: 5\' 3"  (1.6 m) 5\' 3"  (1.6 m)    General:  WDWN in NAD; vital signs documented above Gait: Not observed HENT: WNL, normocephalic Pulmonary: normal non-labored breathing , without Rales, rhonchi,  wheezing Cardiac: regular HR, without   Abdomen: soft, NT, no masses Skin: without rashes Vascular Exam/Pulses:  Right Left  Radial 2+ (normal) 2+ (normal)                       Extremities: without ischemic changes, without  Gangrene , without cellulitis; without open wounds;  Musculoskeletal: no muscle wasting or atrophy  Neurologic: A&O X 3;  No focal weakness or paresthesias are detected Psychiatric:  The pt has Normal affect.   Non-Invasive Vascular Imaging:   See CV studies    ASSESSMENT/PLAN:  Michelle Abbott is a 77 y.o. female who presents with chronic kidney disease stage 5  At her last visit, we had a long discussion regarding hemodialysis.   She will likely require a graft. After discussing the risks and benefits, Michelle Abbott elected to proceed with Left arm AVG v AVF   Victorino Sparrow, MD [facility] [phone]

## 2022-12-08 NOTE — Discharge Instructions (Signed)
[facility]  Discharge Instructions  AV Fistula or Graft Surgery for Dialysis Access  Please refer to the following instructions for your post-procedure care. Your surgeon or physician assistant will discuss any changes with you.  Activity  You may drive the day following your surgery, if you are comfortable and no longer taking prescription pain medication. Resume full activity as the soreness in your incision resolves.  Bathing/Showering  You may shower after you go home. Keep your incision dry for 48 hours. Do not soak in a bathtub, hot tub, or swim until the incision heals completely. You may not shower if you have a hemodialysis catheter.  Incision Care  Clean your incision with mild soap and water after 48 hours. Pat the area dry with a clean towel. You do not need a bandage unless otherwise instructed. Do not apply any ointments or creams to your incision. You may have skin glue on your incision. Do not peel it off. It will come off on its own in about one week. Your arm may swell a bit after surgery. To reduce swelling use pillows to elevate your arm so it is above your heart. Your doctor will tell you if you need to lightly wrap your arm with an ACE bandage.  Diet  Resume your normal diet. There are not special food restrictions following this procedure. In order to heal from your surgery, it is CRITICAL to get adequate nutrition. Your body requires vitamins, minerals, and protein. Vegetables are the best source of vitamins and minerals. Vegetables also provide the perfect balance of protein. Processed food has little nutritional value, so try to avoid this.  Medications  Resume taking all of your medications. If your incision is causing pain, you may take over-the counter pain relievers such as acetaminophen (Tylenol). If you were prescribed a stronger pain medication, please be aware these medications can cause nausea and constipation. Prevent  nausea by taking the medication with a snack or meal. Avoid constipation by drinking plenty of fluids and eating foods with high amount of fiber, such as fruits, vegetables, and grains.  Do not take Tylenol if you are taking prescription pain medications.  Follow up Your surgeon may want to see you in the office following your access surgery. If so, this will be arranged at the time of your surgery.  Please call us immediately for any of the following conditions:  Increased pain, redness, drainage (pus) from your incision site Fever of 101 degrees or higher Severe or worsening pain at your incision site Hand pain or numbness.  Reduce your risk of vascular disease:  Stop smoking. If you would like help, call [facility] at 1-800-QUIT-NOW (769-412-0141) or  at (559) 678-2867  Manage your cholesterol Maintain a desired weight Control your diabetes Keep your blood pressure down  Dialysis  It will take several weeks to several months for your new dialysis access to be ready for use. Your surgeon will determine when it is okay to use it. Your nephrologist will continue to direct your dialysis. You can continue to use your Permcath until your new access is ready for use.   12/08/2022 Michelle Abbott [id number] Oct 15, 1945  Surgeon(s): Victorino Sparrow, MD  Procedure(s): CREATION OF LEFT ARM  BRACHIO BASILIC FISTULA   (FIRST STAGE)   May stick graft immediately   May stick graft on designated area only:   x Do not stick fistula for 12 weeks    If you have any questions, please  call the office at [phone].

## 2022-12-08 NOTE — Op Note (Signed)
    NAME: Michelle Abbott    MRN: [id number] DOB: Sep 20, 1945    DATE OF OPERATION: 12/08/2022  PREOP DIAGNOSIS:    End stage renal disease  POSTOP DIAGNOSIS:    Same  PROCEDURE:    Left arm first stage brachiobasilic fistula creation  SURGEON: Victorino Sparrow  ASSIST: Kayren Eaves, PA  ANESTHESIA: General   EBL: 5ml  INDICATIONS:    Michelle Abbott is a 77 y.o. female progressing to end stage renal disease in need for long term HD access. After discussing the risks and benefits of AV graft v AV fistula, Michelle Abbott elected to proceed. She is aware that should a brachiobasilic fistula be possible it would require later transposition.  FINDINGS:   4mm basilic vein  4mm brachial artery   TECHNIQUE:    The patient was brought to the operating room and placed in supine position. The left arm was prepped and draped in a standard fashion. IV antibiotics were prior to incision. A timeout was performed.   The basilic vein in the left arm was identified using ultrasound and appeared of sufficient size. A transverse incision was made above the elbow creese in the antecubital fossa. The basilic vein was identified and isolated for 4 cm in length.  The bicipital aponeurosis was partially released and the brachial artery freed from its paired brachial veins and secured with a vessel loop. The patient was heparinized. The basilic vein was marked and ligated distally with 2-0 silk, then flushed with heparinized saline. Vascular clamps were placed proximally and distally on the brachial artery and a 5 mm arteriotomy  was created on the brachial artery. This was flushed with heparin saline. The vein was juxtaposed to the artery and an anastomosis was created using 6-0 Prolene.   Prior to completing the anastomsis, the vessels were flushed and the suture line was tied down. There was an excellent thrill in the basilic vein from the anastomosis into the upper arm. The patient had a 2+ radial pulse.  The incision was irrigated and hemostasis acheived. The deeper tissue was closed with 3-0 Vicryl and the skin closed with 4-0 Monocryl.    Dermabond was applied the incisions. She was transferred to PACU in stable condition.     Michelle Snide, MD [facility] DATE OF DICTATION:   12/08/2022

## 2022-12-08 NOTE — Anesthesia Procedure Notes (Signed)
Procedure Name: LMA Insertion Date/Time: 12/08/2022 7:52 AM  Performed by: Demetrio Lapping, CRNAPre-anesthesia Checklist: Patient identified, Emergency Drugs available, Suction available and Patient being monitored Patient Re-evaluated:Patient Re-evaluated prior to induction Oxygen Delivery Method: Circle System Utilized Preoxygenation: Pre-oxygenation with 100% oxygen Induction Type: IV induction Ventilation: Mask ventilation without difficulty LMA: LMA inserted LMA Size: 4.0 Number of attempts: 1 Airway Equipment and Method: Bite block Placement Confirmation: positive ETCO2 Tube secured with: Tape Dental Injury: Teeth and Oropharynx as per pre-operative assessment

## 2022-12-08 NOTE — Transfer of Care (Signed)
Immediate Anesthesia Transfer of Care Note  Patient: Preethi Scantlebury  Procedure(s) Performed: CREATION OF LEFT ARM  BRACHIO BASILIC FISTULA   (FIRST STAGE) (Left: Arm Upper)  Patient Location: PACU  Anesthesia Type:General  Level of Consciousness: awake and patient cooperative  Airway & Oxygen Therapy: Patient Spontanous Breathing and Patient connected to face mask oxygen  Post-op Assessment: Report given to RN and Post -op Vital signs reviewed and stable  Post vital signs: Reviewed and stable  Last Vitals:  Vitals Value Taken Time  BP 149/68 12/08/22 0930  Temp 36.7 C 12/08/22 0930  Pulse 83 12/08/22 0930  Resp 14 12/08/22 0930  SpO2 98 % 12/08/22 0930  Vitals shown include unfiled device data.  Last Pain:  Vitals:   12/08/22 0930  PainSc: 4       Patients Stated Pain Goal: 3 (12/08/22 0930)  Complications: No notable events documented.

## 2022-12-08 NOTE — Anesthesia Postprocedure Evaluation (Signed)
Anesthesia Post Note  Patient: Michelle Abbott  Procedure(s) Performed: CREATION OF LEFT ARM  BRACHIO BASILIC FISTULA   (FIRST STAGE) (Left: Arm Upper)     Patient location during evaluation: PACU Anesthesia Type: General Level of consciousness: awake and alert Pain management: pain level controlled Vital Signs Assessment: post-procedure vital signs reviewed and stable Respiratory status: spontaneous breathing, nonlabored ventilation and respiratory function stable Cardiovascular status: blood pressure returned to baseline and stable Postop Assessment: no apparent nausea or vomiting Anesthetic complications: no   No notable events documented.  Last Vitals:  Vitals:   12/08/22 0915 12/08/22 0930  BP: 128/62 (!) 149/68  Pulse: 77 81  Resp: 13 14  Temp:  36.7 C  SpO2: 100% 98%    Last Pain:  Vitals:   12/08/22 0930  PainSc: 4                  Lowella Curb

## 2022-12-09 ENCOUNTER — Encounter (HOSPITAL_COMMUNITY): Payer: Self-pay | Admitting: Vascular Surgery

## 2022-12-11 ENCOUNTER — Telehealth: Payer: Self-pay

## 2022-12-11 NOTE — Telephone Encounter (Signed)
Returned call to pt in regards to VM left stating c/o redness and swelling in L arm. PT stated that she noticed some swelling and redness in L arm incision site. Pt denied pain, warmth, drainage and fever/chills. When asked if she had a fever she states she did not feel like she did but would take it and call me back. Pt called me back and stated temp was 96.6 temporal. She also then stated "it's just swollen a little and does not look infected" I advised her to continue to monitor the incision. If any signs of infection present - fever/chills, drainage, warmth at incision site, redness and pain to give our office a call or go to the nearest ED. Pt verbalized understanding and agreed with this plan.

## 2023-01-06 DIAGNOSIS — N189 Chronic kidney disease, unspecified: Secondary | ICD-10-CM | POA: Diagnosis not present

## 2023-01-06 DIAGNOSIS — I120 Hypertensive chronic kidney disease with stage 5 chronic kidney disease or end stage renal disease: Secondary | ICD-10-CM | POA: Diagnosis not present

## 2023-01-06 DIAGNOSIS — D631 Anemia in chronic kidney disease: Secondary | ICD-10-CM | POA: Diagnosis not present

## 2023-01-06 DIAGNOSIS — E559 Vitamin D deficiency, unspecified: Secondary | ICD-10-CM | POA: Diagnosis not present

## 2023-01-06 DIAGNOSIS — N185 Chronic kidney disease, stage 5: Secondary | ICD-10-CM | POA: Diagnosis not present

## 2023-01-07 ENCOUNTER — Other Ambulatory Visit: Payer: Self-pay

## 2023-01-07 DIAGNOSIS — N185 Chronic kidney disease, stage 5: Secondary | ICD-10-CM

## 2023-01-11 DIAGNOSIS — E113293 Type 2 diabetes mellitus with mild nonproliferative diabetic retinopathy without macular edema, bilateral: Secondary | ICD-10-CM | POA: Diagnosis not present

## 2023-01-15 ENCOUNTER — Ambulatory Visit (HOSPITAL_COMMUNITY)
Admission: RE | Admit: 2023-01-15 | Discharge: 2023-01-15 | Disposition: A | Discharge status: Home / Self Care | Payer: 59 | Source: Ambulatory Visit | Attending: Vascular Surgery | Admitting: Vascular Surgery

## 2023-01-15 ENCOUNTER — Ambulatory Visit (INDEPENDENT_AMBULATORY_CARE_PROVIDER_SITE_OTHER): Payer: 59 | Admitting: Physician Assistant

## 2023-01-15 VITALS — BP 125/64 | HR 76 | Temp 97.8°F | Resp 16 | Ht 63.0 in | Wt 176.3 lb

## 2023-01-15 DIAGNOSIS — N185 Chronic kidney disease, stage 5: Secondary | ICD-10-CM

## 2023-01-15 NOTE — Progress Notes (Signed)
POST OPERATIVE OFFICE NOTE    CC:  F/u for surgery  HPI:  This is a 77 y.o. female who is s/p first stage basilic fistula creation on 12/08/22 by Dr. Karin Lieu.  She is not on HD she has CKD stage V.  Pt returns today for follow up.  Pt states she has no symptoms of steal.  No weakness or loss of sensation.     Allergies  Allergen Reactions   Ciprofloxacin Hcl Other (See Comments)    Burning at IV site   Metformin And Related Other (See Comments)    CKD    Ace Inhibitors Cough    Lisinopril   Amlodipine Swelling    Lower leg swelling.    Baclofen Nausea Only and Other (See Comments)    Disoriented, dizziness, weakness, fatigue.    Feraheme [Ferumoxytol] Other (See Comments)    Chest pain    Lisinopril Palpitations    Current Outpatient Medications  Medication Sig Dispense Refill   ACCU-CHEK FASTCLIX LANCETS MISC TEST BLOOD SUGAR FOUR TIMES DAILY 408 each 11   acetaminophen (TYLENOL 8 HOUR) 650 MG CR tablet Take 1 tablet (650 mg total) by mouth every 8 (eight) hours as needed for pain. 30 tablet 1   allopurinol (ZYLOPRIM) 100 MG tablet Take 1 tablet (100 mg total) by mouth 2 (two) times a week. Sun and Wed 30 tablet 0   atorvastatin (LIPITOR) 40 MG tablet Take 1 tablet (40 mg total) by mouth daily. 90 tablet 3   B-D ULTRAFINE III SHORT PEN 31G X 8 MM MISC Use to give insulin twice a day and victoza once a day. Dx code = E11.9 300 each 3   Blood Glucose Monitoring Suppl (ONETOUCH VERIO) w/Device KIT Use to check blood sugars 3x daily. E11.9 1 kit 0   carvedilol (COREG) 25 MG tablet Take 25 mg by mouth daily.     Cholecalciferol (VITAMIN D) 125 MCG (5000 UT) CAPS Take 5,000 Units by mouth daily.     colchicine 0.6 MG tablet Take 2 tablets (1.2 mg total) at onset of gout flare then take 1 tablet (0.6 mg) one hour later if symptoms persist. (MAX: 1.8 mg total daily) 30 tablet 1   Cyanocobalamin (VITAMIN B-12) 2500 MCG SUBL Place 2,500 mcg under the tongue daily.     ferrous sulfate  325 (65 FE) MG tablet Take 325 mg by mouth daily.     furosemide (LASIX) 40 MG tablet Take 40 mg by mouth daily.     losartan (COZAAR) 100 MG tablet Take 1 tablet (100 mg total) by mouth daily. 90 tablet 0   Multiple Vitamin (MULTIVITAMIN WITH MINERALS) TABS tablet Take 1 tablet by mouth daily.     ONETOUCH ULTRA test strip USE TO TEST BLOOD SUGARS 3X DAILY. E11.9 100 strip 4   oxybutynin (DITROPAN XL) 15 MG 24 hr tablet Take 15 mg by mouth every morning.     Semaglutide, 1 MG/DOSE, (OZEMPIC, 1 MG/DOSE,) 4 MG/3ML SOPN INJECT SUBCUTANEOUSLY 1 MG EVERY WEEK 9 mL 3   spironolactone (ALDACTONE) 25 MG tablet Take 1 tablet (25 mg total) by mouth daily. 90 tablet 3   traMADol (ULTRAM) 50 MG tablet Take 1 tablet (50 mg total) by mouth every 6 (six) hours as needed. 10 tablet 0   No current facility-administered medications for this visit.     ROS:  See HPI  Physical Exam:    Incision:  healed Extremities:  palpable thrill near arterial anastomosis Palpable radial pulse  Findings:  +--------------------+----------+-----------------+--------+  AVF                PSV (cm/s)Flow Vol (mL/min)Comments  +--------------------+----------+-----------------+--------+  Native artery inflow   172           683                 +--------------------+----------+-----------------+--------+  AVF Anastomosis        767                               +--------------------+----------+-----------------+--------+     +------------+----------+-------------+----------+--------+  OUTFLOW VEINPSV (cm/s)Diameter (cm)Depth (cm)Describe  +------------+----------+-------------+----------+--------+  Mid UA         211        0.56        1.09             +------------+----------+-------------+----------+--------+  Dist UA        226        0.46        1.04             +------------+----------+-------------+----------+--------+  AC Fossa       563        0.65        0.93              +------------+----------+-------------+----------+--------+        Summary:  Patent arteriovenous fistula.  Arteriovenous fistula-Elevated velocities suggestive of stenosis noted in  the  outflow vein at the [facility] fossa.  Volume flow=683 mL/min  Elevated velocities at the anastomosis (767cm/s).     Assessment/Plan:  This is a 76 y.o. female who is s/p: First stage basilic for CKD stage V  Her fistula is maturing well with flow rates > 600.  I will schedule her for second stage basilic transposition.  She is not on anticoagulation.       Mosetta Pigeon PA-C [facility] [phone]   Clinic MD:  Karin Lieu

## 2023-01-21 ENCOUNTER — Other Ambulatory Visit: Payer: Self-pay | Admitting: Student

## 2023-01-21 ENCOUNTER — Telehealth: Payer: Self-pay | Admitting: *Deleted

## 2023-01-21 DIAGNOSIS — E1140 Type 2 diabetes mellitus with diabetic neuropathy, unspecified: Secondary | ICD-10-CM

## 2023-01-22 ENCOUNTER — Other Ambulatory Visit: Payer: Self-pay

## 2023-01-22 DIAGNOSIS — N185 Chronic kidney disease, stage 5: Secondary | ICD-10-CM

## 2023-01-22 NOTE — Telephone Encounter (Signed)
Spoke with patient. Scheduled surgery for 9/17 and instructions provided. Patient normally takes Semaglutide on Wed. She verbalized understanding not to take injection prior to surgery and can resume on 9/18.

## 2023-02-01 ENCOUNTER — Encounter (HOSPITAL_COMMUNITY): Payer: Self-pay | Admitting: Vascular Surgery

## 2023-02-01 NOTE — Anesthesia Preprocedure Evaluation (Signed)
Anesthesia Evaluation  Patient identified by MRN, date of birth, ID band Patient awake    Reviewed: Allergy & Precautions, H&P , NPO status , Patient's Chart, lab work & pertinent test results  Airway Mallampati: II  TM Distance: >3 FB Neck ROM: Full    Dental no notable dental hx.    Pulmonary former smoker   Pulmonary exam normal breath sounds clear to auscultation       Cardiovascular hypertension, Pt. on medications Normal cardiovascular exam Rhythm:Regular Rate:Normal     Neuro/Psych negative neurological ROS  negative psych ROS   GI/Hepatic Neg liver ROS,GERD  Medicated,,  Endo/Other  diabetes    Renal/GU ESRFRenal disease  negative genitourinary   Musculoskeletal negative musculoskeletal ROS (+)    Abdominal   Peds negative pediatric ROS (+)  Hematology  (+) Blood dyscrasia, anemia   Anesthesia Other Findings   Reproductive/Obstetrics negative OB ROS                             Anesthesia Physical Anesthesia Plan  ASA: 3  Anesthesia Plan: General   Post-op Pain Management: Minimal or no pain anticipated   Induction: Intravenous  PONV Risk Score and Plan: Ondansetron and Treatment may vary due to age or medical condition  Airway Management Planned: LMA  Additional Equipment:   Intra-op Plan:   Post-operative Plan: Extubation in OR  Informed Consent: I have reviewed the patients History and Physical, chart, labs and discussed the procedure including the risks, benefits and alternatives for the proposed anesthesia with the patient or authorized representative who has indicated his/her understanding and acceptance.     Dental advisory given  Plan Discussed with: CRNA and Surgeon  Anesthesia Plan Comments: (PAT note written 02/01/2023 by Shonna Chock, PA-C.  )       Anesthesia Quick Evaluation

## 2023-02-01 NOTE — Progress Notes (Signed)
Anesthesia Chart Review: Michelle Abbott  Case: [id number] Date/Time: 02/02/23 0715   Procedure: LEFT ARM SECOND STAGE BASILIC VEIN TRANSPOSITION (Left)   Anesthesia type: Monitor Anesthesia Care   Pre-op diagnosis: Chronic kidney disease, stage V   [facility] ROOM 16 / [facility]   Surgeons: Victorino Sparrow, MD       DISCUSSION: Patient is a 77 year old female scheduled for the above procedure. S/p left arm first stage brachiobasilic fistula creation on 12/08/22.   History includes former smoker (quit 05/18/97), HTN, HLD, DM2, CKD (stage V), anemia, GERD.    Per 01/21/23 [facility] RN Bryon Lions, patient was instructed to hold semaglutide 01/27/23 dose and than can resume on 02/03/23.     She is a same day work-up, so labs and anesthesia team evaluation on the day of surgery.  VS:  BP Readings from Last 3 Encounters:  01/15/23 125/64  12/08/22 (!) 149/68  10/15/22 136/62   Pulse Readings from Last 3 Encounters:  01/15/23 76  12/08/22 81  10/15/22 63    PROVIDERS: Celine Mans, MD is PCP  Zetta Bills, MD is nephrologist    LABS: For day of surgery. Last results in [facility] include: Lab Results  Component Value Date   WBC 6.2 06/04/2022   HGB 10.2 (L) 12/08/2022   HCT 30.0 (L) 12/08/2022   PLT 243 06/04/2022   GLUCOSE 102 (H) 12/08/2022   ALT 12 06/04/2022   AST 14 (L) 06/04/2022   NA 140 12/08/2022   K 4.9 12/08/2022   CL 111 12/08/2022   CREATININE 6.70 (H) 12/08/2022   BUN 97 (H) 12/08/2022   CO2 22 06/04/2022   INR 1.1 06/04/2022   HGBA1C 5.9 09/17/2022    IMAGES: 1V PCXR 06/04/22: FINDINGS: The heart size and mediastinal contours are within normal limits. Both lungs are clear. The visualized skeletal structures are unremarkable. IMPRESSION: No active disease.     EKG: 06/04/22:  Sinus rhythm Borderline left axis deviation Consider anterior infarct Confirmed by Gilda Crease [id number]) on 06/04/2022 11:31:47 PM     CV: 1V PCXR 06/04/22: FINDINGS: The  heart size and mediastinal contours are within normal limits. Both lungs are clear. The visualized skeletal structures are unremarkable. IMPRESSION: No active disease.     TTE 03/01/14: 1. Left ventricle: The cavity size was normal. Wall thickness was mildly increased in a pattern of concentric left ventricular hypertrophy. Systolic function was normal. The estimated ejection fraction was in the range of 55% to 65%. Images were inadequate for LV wall motion assessment. There was Grade I (mild) diastolic dysfunction. 2. Right ventricle: Poorly visualized but grossly normal. 3. Pulmonary arteries: Systolic pressure could not be accurately estimated.  Impressions: Date of prior study: 02/16/2007 Probably no significant change.   Past Medical History:  Diagnosis Date   Acute kidney injury superimposed on chronic kidney disease (HCC) 04/28/2020   Anemia    Arthritis    Chronic kidney disease    Stage 4   Diabetes mellitus without complication (HCC)    type 2   GERD (gastroesophageal reflux disease)    Gout 2017   Hyperlipidemia    Hypertension    Hypertensive nephropathy 05/09/2021   Nephrotic range proteinuria 05/09/2021   Postoperative pain 04/29/2020   Rectal bleeding, History of 12/28/2019   Sciatica    Secondary hyperparathyroidism of renal origin (HCC) 05/09/2021   SI (sacroiliac) joint inflammation (HCC) 03/12/2019   UTI (urinary tract infection) 02/02/2019   Vitamin D deficiency 2014  Past Surgical History:  Procedure Laterality Date   AV FISTULA PLACEMENT Left 12/08/2022   Procedure: CREATION OF LEFT ARM  BRACHIO BASILIC FISTULA   (FIRST STAGE);  Surgeon: Victorino Sparrow, MD;  Location: [facility] OR;  Service: Vascular;  Laterality: Left;   BREAST SURGERY Right 1961   Benign lump removed   CATARACT EXTRACTION, BILATERAL Bilateral 2017   CESAREAN SECTION  1985   CESAREAN SECTION  1990   CHOLECYSTECTOMY  1999   COLONOSCOPY     DILATION AND CURETTAGE OF UTERUS  2017   KNEE  ARTHROSCOPY Right    VAGINAL SEPTOPLASTY  1966    MEDICATIONS: No current facility-administered medications for this encounter.    acetaminophen (TYLENOL 8 HOUR) 650 MG CR tablet   allopurinol (ZYLOPRIM) 100 MG tablet   atorvastatin (LIPITOR) 40 MG tablet   carvedilol (COREG) 25 MG tablet   Cholecalciferol (VITAMIN D) 125 MCG (5000 UT) CAPS   Cyanocobalamin (VITAMIN B-12) 2500 MCG SUBL   furosemide (LASIX) 40 MG tablet   losartan (COZAAR) 100 MG tablet   Multiple Vitamin (MULTIVITAMIN WITH MINERALS) TABS tablet   oxybutynin (DITROPAN XL) 15 MG 24 hr tablet   Semaglutide, 1 MG/DOSE, (OZEMPIC, 1 MG/DOSE,) 4 MG/3ML SOPN   spironolactone (ALDACTONE) 25 MG tablet   traMADol (ULTRAM) 50 MG tablet   ACCU-CHEK FASTCLIX LANCETS MISC   B-D ULTRAFINE III SHORT PEN 31G X 8 MM MISC   Blood Glucose Monitoring Suppl (ONETOUCH VERIO) w/Device KIT   colchicine 0.6 MG tablet   ONETOUCH ULTRA test strip    Shonna Chock, PA-C Surgical Short Stay/Anesthesiology [facility] Phone [phone] [facility] Phone [phone] 02/01/2023 9:57 AM

## 2023-02-01 NOTE — Progress Notes (Signed)
SDW call  Patient was given pre-op instructions over the phone. Patient verbalized understanding of instructions provided.     PCP - Dr. Celine Mans Cardiologist -  Pulmonary:    PPM/ICD - denies Device Orders - n/a Rep Notified - n/a   Chest x-ray - 06/04/2022 EKG -  06/08/2022 Stress Test - ECHO -  Cardiac Cath -   Sleep Study/sleep apnea/CPAP: denies  Type II diabetic Fasting Blood sugar range : 80-124 How often check sugars: daily Ozempic, states last dose 01/13/2023   Blood Thinner Instructions: denies Aspirin Instructions:denies   ERAS Protcol - NPO   COVID TEST- n/a    Anesthesia review: Yes. DM, HTN, CKD   Patient denies shortness of breath, fever, cough and chest pain over the phone call  Your procedure is scheduled on Tuesday February 02, 2023  Report to [facility] Main Entrance "A" at  0530  A.M., then check in with the Admitting office.  Call this number if you have problems the morning of surgery:  [phone]   If you have any questions prior to your surgery date call [phone]: Open Monday-Friday 8am-4pm If you experience any cold or flu symptoms such as cough, fever, chills, shortness of breath, etc. between now and your scheduled surgery, please notify us at the above number     Remember:  Do not eat or drink after midnight the night before your surgery  Take these medicines the morning of surgery with A SIP OF WATER:  Atorvastatin, carvedilol, ditropan  As needed: Tylenol, tamadol  As of today, STOP taking any Aspirin (unless otherwise instructed by your surgeon) Aleve, Naproxen, Ibuprofen, Motrin, Advil, Goody's, BC's, all herbal medications, fish oil, and all vitamins.

## 2023-02-02 ENCOUNTER — Ambulatory Visit (HOSPITAL_BASED_OUTPATIENT_CLINIC_OR_DEPARTMENT_OTHER): Payer: 59 | Admitting: Vascular Surgery

## 2023-02-02 ENCOUNTER — Encounter (HOSPITAL_COMMUNITY): Payer: Self-pay | Admitting: Vascular Surgery

## 2023-02-02 ENCOUNTER — Other Ambulatory Visit: Payer: Self-pay

## 2023-02-02 ENCOUNTER — Ambulatory Visit (HOSPITAL_COMMUNITY): Payer: 59 | Admitting: Vascular Surgery

## 2023-02-02 ENCOUNTER — Ambulatory Visit (HOSPITAL_COMMUNITY)
Admission: RE | Admit: 2023-02-02 | Discharge: 2023-02-02 | Disposition: A | Discharge status: Home / Self Care | Payer: 59 | Attending: Vascular Surgery | Admitting: Vascular Surgery

## 2023-02-02 ENCOUNTER — Encounter (HOSPITAL_COMMUNITY)
Admission: RE | Disposition: A | Discharge status: Home / Self Care | Payer: Self-pay | Source: Home / Self Care | Attending: Vascular Surgery

## 2023-02-02 DIAGNOSIS — I120 Hypertensive chronic kidney disease with stage 5 chronic kidney disease or end stage renal disease: Secondary | ICD-10-CM

## 2023-02-02 DIAGNOSIS — N185 Chronic kidney disease, stage 5: Secondary | ICD-10-CM | POA: Diagnosis not present

## 2023-02-02 DIAGNOSIS — I1311 Hypertensive heart and chronic kidney disease without heart failure, with stage 5 chronic kidney disease, or end stage renal disease: Secondary | ICD-10-CM | POA: Insufficient documentation

## 2023-02-02 DIAGNOSIS — Z87891 Personal history of nicotine dependence: Secondary | ICD-10-CM | POA: Diagnosis not present

## 2023-02-02 DIAGNOSIS — K219 Gastro-esophageal reflux disease without esophagitis: Secondary | ICD-10-CM | POA: Diagnosis not present

## 2023-02-02 DIAGNOSIS — Z794 Long term (current) use of insulin: Secondary | ICD-10-CM | POA: Diagnosis not present

## 2023-02-02 DIAGNOSIS — E1122 Type 2 diabetes mellitus with diabetic chronic kidney disease: Secondary | ICD-10-CM | POA: Insufficient documentation

## 2023-02-02 DIAGNOSIS — E785 Hyperlipidemia, unspecified: Secondary | ICD-10-CM | POA: Insufficient documentation

## 2023-02-02 DIAGNOSIS — Z79899 Other long term (current) drug therapy: Secondary | ICD-10-CM | POA: Diagnosis not present

## 2023-02-02 DIAGNOSIS — N186 End stage renal disease: Secondary | ICD-10-CM | POA: Diagnosis not present

## 2023-02-02 HISTORY — PX: BASCILIC VEIN TRANSPOSITION: SHX5742

## 2023-02-02 LAB — POCT I-STAT, CHEM 8
BUN: 79 mg/dL — ABNORMAL HIGH (ref 8–23)
Calcium, Ion: 1.23 mmol/L (ref 1.15–1.40)
Chloride: 116 mmol/L — ABNORMAL HIGH (ref 98–111)
Creatinine, Ser: 5.6 mg/dL — ABNORMAL HIGH (ref 0.44–1.00)
Glucose, Bld: 108 mg/dL — ABNORMAL HIGH (ref 70–99)
HCT: 30 % — ABNORMAL LOW (ref 36.0–46.0)
Hemoglobin: 10.2 g/dL — ABNORMAL LOW (ref 12.0–15.0)
Potassium: 5.1 mmol/L (ref 3.5–5.1)
Sodium: 143 mmol/L (ref 135–145)
TCO2: 17 mmol/L — ABNORMAL LOW (ref 22–32)

## 2023-02-02 LAB — GLUCOSE, CAPILLARY
Glucose-Capillary: 97 mg/dL (ref 70–99)
Glucose-Capillary: 116 mg/dL — ABNORMAL HIGH (ref 70–99)

## 2023-02-02 SURGERY — TRANSPOSITION, VEIN, BASILIC
Anesthesia: General | Site: Arm Upper | Laterality: Left

## 2023-02-02 MED ORDER — 0.9 % SODIUM CHLORIDE (POUR BTL) OPTIME
TOPICAL | Status: DC | PRN
  Administered 2023-02-02: 1000 mL

## 2023-02-02 MED ORDER — ONDANSETRON HCL 4 MG/2ML IJ SOLN: 4.0000 mg | Freq: Once | INTRAMUSCULAR | Status: DC | PRN

## 2023-02-02 MED ORDER — TRAMADOL HCL 50 MG PO TABS: 50.0000 mg | ORAL_TABLET | Freq: Four times a day (QID) | ORAL | 0 refills | Status: AC | PRN

## 2023-02-02 MED ORDER — FENTANYL CITRATE (PF) 250 MCG/5ML IJ SOLN
INTRAMUSCULAR | Status: AC
  Filled 2023-02-02: qty 5

## 2023-02-02 MED ORDER — PHENYLEPHRINE 80 MCG/ML (10ML) SYRINGE FOR IV PUSH (FOR BLOOD PRESSURE SUPPORT)
PREFILLED_SYRINGE | INTRAVENOUS | Status: DC | PRN
  Administered 2023-02-02 (×2): 80 ug via INTRAVENOUS

## 2023-02-02 MED ORDER — LIDOCAINE 2% (20 MG/ML) 5 ML SYRINGE
INTRAMUSCULAR | Status: DC | PRN
  Administered 2023-02-02: 100 mg via INTRAVENOUS

## 2023-02-02 MED ORDER — PROPOFOL 10 MG/ML IV BOLUS
INTRAVENOUS | Status: DC | PRN
Start: 2023-02-02 — End: 2023-02-02
  Administered 2023-02-02: 100 mg via INTRAVENOUS

## 2023-02-02 MED ORDER — MIDAZOLAM HCL 2 MG/2ML IJ SOLN
INTRAMUSCULAR | Status: DC | PRN
  Administered 2023-02-02: 2 mg via INTRAVENOUS

## 2023-02-02 MED ORDER — HEPARIN 6000 UNIT IRRIGATION SOLUTION
Status: DC | PRN
  Administered 2023-02-02: 1

## 2023-02-02 MED ORDER — SODIUM CHLORIDE 0.9 % IV SOLN: INTRAVENOUS | Status: DC

## 2023-02-02 MED ORDER — ONDANSETRON HCL 4 MG/2ML IJ SOLN
INTRAMUSCULAR | Status: DC | PRN
  Administered 2023-02-02: 4 mg via INTRAVENOUS

## 2023-02-02 MED ORDER — PROTAMINE SULFATE 10 MG/ML IV SOLN
INTRAVENOUS | Status: DC | PRN
Start: 2023-02-02 — End: 2023-02-02
  Administered 2023-02-02: 20 mg via INTRAVENOUS

## 2023-02-02 MED ORDER — FENTANYL CITRATE (PF) 100 MCG/2ML IJ SOLN
INTRAMUSCULAR | Status: AC
  Filled 2023-02-02: qty 2

## 2023-02-02 MED ORDER — CEFAZOLIN SODIUM-DEXTROSE 2-4 GM/100ML-% IV SOLN
2.0000 g | INTRAVENOUS | Status: AC
  Administered 2023-02-02: 2 g via INTRAVENOUS
  Filled 2023-02-02: qty 100

## 2023-02-02 MED ORDER — HEPARIN SODIUM (PORCINE) 1000 UNIT/ML IJ SOLN
INTRAMUSCULAR | Status: DC | PRN
Start: 2023-02-02 — End: 2023-02-02
  Administered 2023-02-02: 3000 [IU] via INTRAVENOUS

## 2023-02-02 MED ORDER — PROPOFOL 10 MG/ML IV BOLUS
INTRAVENOUS | Status: AC
  Filled 2023-02-02: qty 20

## 2023-02-02 MED ORDER — LIDOCAINE 2% (20 MG/ML) 5 ML SYRINGE
INTRAMUSCULAR | Status: AC
  Filled 2023-02-02: qty 5

## 2023-02-02 MED ORDER — EPHEDRINE SULFATE-NACL 50-0.9 MG/10ML-% IV SOSY
PREFILLED_SYRINGE | INTRAVENOUS | Status: DC | PRN
  Administered 2023-02-02: 10 mg via INTRAVENOUS
  Administered 2023-02-02: 5 mg via INTRAVENOUS
  Administered 2023-02-02: 10 mg via INTRAVENOUS

## 2023-02-02 MED ORDER — CHLORHEXIDINE GLUCONATE 4 % EX SOLN: 60.0000 mL | Freq: Once | CUTANEOUS | Status: DC

## 2023-02-02 MED ORDER — ONDANSETRON HCL 4 MG/2ML IJ SOLN
INTRAMUSCULAR | Status: AC
  Filled 2023-02-02: qty 2

## 2023-02-02 MED ORDER — HEMOSTATIC AGENTS (NO CHARGE) OPTIME
TOPICAL | Status: DC | PRN
  Administered 2023-02-02: 2 via TOPICAL

## 2023-02-02 MED ORDER — LIDOCAINE-EPINEPHRINE (PF) 1 %-1:200000 IJ SOLN
INTRAMUSCULAR | Status: AC
  Filled 2023-02-02: qty 30

## 2023-02-02 MED ORDER — CEFAZOLIN SODIUM 1 G IJ SOLR
INTRAMUSCULAR | Status: AC
  Filled 2023-02-02: qty 20

## 2023-02-02 MED ORDER — PHENYLEPHRINE 80 MCG/ML (10ML) SYRINGE FOR IV PUSH (FOR BLOOD PRESSURE SUPPORT)
PREFILLED_SYRINGE | INTRAVENOUS | Status: AC
  Filled 2023-02-02: qty 10

## 2023-02-02 MED ORDER — EPHEDRINE 5 MG/ML INJ
INTRAVENOUS | Status: AC
  Filled 2023-02-02: qty 5

## 2023-02-02 MED ORDER — PHENYLEPHRINE HCL-NACL 20-0.9 MG/250ML-% IV SOLN
INTRAVENOUS | Status: DC | PRN
Start: 2023-02-02 — End: 2023-02-02
  Administered 2023-02-02: 25 ug/min via INTRAVENOUS

## 2023-02-02 MED ORDER — FENTANYL CITRATE (PF) 100 MCG/2ML IJ SOLN
25.0000 ug | INTRAMUSCULAR | Status: DC | PRN
  Administered 2023-02-02: 50 ug via INTRAVENOUS

## 2023-02-02 MED ORDER — CHLORHEXIDINE GLUCONATE 0.12 % MT SOLN
OROMUCOSAL | Status: AC
  Filled 2023-02-02: qty 15

## 2023-02-02 MED ORDER — DEXAMETHASONE SODIUM PHOSPHATE 10 MG/ML IJ SOLN
INTRAMUSCULAR | Status: AC
  Filled 2023-02-02: qty 1

## 2023-02-02 MED ORDER — PROPOFOL 1000 MG/100ML IV EMUL
INTRAVENOUS | Status: AC
  Filled 2023-02-02: qty 100

## 2023-02-02 MED ORDER — OXYCODONE HCL 5 MG PO TABS
ORAL_TABLET | ORAL | Status: AC
  Filled 2023-02-02: qty 1

## 2023-02-02 MED ORDER — MIDAZOLAM HCL 2 MG/2ML IJ SOLN
INTRAMUSCULAR | Status: AC
  Filled 2023-02-02: qty 2

## 2023-02-02 MED ORDER — PROTAMINE SULFATE 10 MG/ML IV SOLN
INTRAVENOUS | Status: AC
  Filled 2023-02-02: qty 25

## 2023-02-02 MED ORDER — FENTANYL CITRATE (PF) 250 MCG/5ML IJ SOLN
INTRAMUSCULAR | Status: DC | PRN
  Administered 2023-02-02 (×2): 25 ug via INTRAVENOUS

## 2023-02-02 MED ORDER — OXYCODONE HCL 5 MG PO TABS
5.0000 mg | ORAL_TABLET | Freq: Once | ORAL | Status: AC | PRN
  Administered 2023-02-02: 5 mg via ORAL

## 2023-02-02 MED ORDER — HEPARIN 6000 UNIT IRRIGATION SOLUTION
Status: AC
  Filled 2023-02-02: qty 500

## 2023-02-02 MED ORDER — INSULIN ASPART 100 UNIT/ML IJ SOLN: 0.0000 [IU] | INTRAMUSCULAR | Status: DC | PRN

## 2023-02-02 MED ORDER — OXYCODONE HCL 5 MG/5ML PO SOLN: 5.0000 mg | Freq: Once | ORAL | Status: AC | PRN

## 2023-02-02 SURGICAL SUPPLY — 42 items
ADH SKN CLS APL DERMABOND .7 (GAUZE/BANDAGES/DRESSINGS) ×1
ARMBAND PINK RESTRICT EXTREMIT (MISCELLANEOUS) ×1 IMPLANT
BAG COUNTER SPONGE SURGICOUNT (BAG) ×1 IMPLANT
BAG SPNG CNTER NS LX DISP (BAG) ×1
BNDG ELASTIC 4X5.8 VLCR STR LF (GAUZE/BANDAGES/DRESSINGS) ×1 IMPLANT
CANISTER SUCT 3000ML PPV (MISCELLANEOUS) ×1 IMPLANT
CLIP TI MEDIUM 24 (CLIP) ×1 IMPLANT
CLIP TI MEDIUM 6 (CLIP) IMPLANT
CLIP TI WIDE RED SMALL 24 (CLIP) ×1 IMPLANT
CLIP TI WIDE RED SMALL 6 (CLIP) IMPLANT
COVER PROBE W GEL 5X96 (DRAPES) ×1 IMPLANT
DERMABOND ADVANCED .7 DNX12 (GAUZE/BANDAGES/DRESSINGS) ×1 IMPLANT
ELECT REM PT RETURN 9FT ADLT (ELECTROSURGICAL) ×1
ELECTRODE REM PT RTRN 9FT ADLT (ELECTROSURGICAL) ×1 IMPLANT
GLOVE BIOGEL PI IND STRL 8 (GLOVE) ×1 IMPLANT
GOWN STRL REUS W/ TWL LRG LVL3 (GOWN DISPOSABLE) ×2 IMPLANT
GOWN STRL REUS W/TWL 2XL LVL3 (GOWN DISPOSABLE) ×2 IMPLANT
GOWN STRL REUS W/TWL LRG LVL3 (GOWN DISPOSABLE) ×2
HEMOSTAT SNOW SURGICEL 2X4 (HEMOSTASIS) IMPLANT
KIT BASIN OR (CUSTOM PROCEDURE TRAY) ×1 IMPLANT
KIT TURNOVER KIT B (KITS) ×1 IMPLANT
NDL HYPO 25GX1X1/2 BEV (NEEDLE) ×1 IMPLANT
NEEDLE HYPO 25GX1X1/2 BEV (NEEDLE) ×1 IMPLANT
NS IRRIG 1000ML POUR BTL (IV SOLUTION) ×1 IMPLANT
PACK CV ACCESS (CUSTOM PROCEDURE TRAY) ×1 IMPLANT
PAD ARMBOARD 7.5X6 YLW CONV (MISCELLANEOUS) ×2 IMPLANT
SLING ARM FOAM STRAP LRG (SOFTGOODS) IMPLANT
SLING ARM FOAM STRAP MED (SOFTGOODS) IMPLANT
SPIKE FLUID TRANSFER (MISCELLANEOUS) ×1 IMPLANT
SPONGE T-LAP 18X18 ~~LOC~~+RFID (SPONGE) IMPLANT
SUT MNCRL AB 4-0 PS2 18 (SUTURE) ×1 IMPLANT
SUT PROLENE 6 0 BV (SUTURE) ×1 IMPLANT
SUT PROLENE 7 0 BV 1 (SUTURE) IMPLANT
SUT SILK 2 0 SH (SUTURE) IMPLANT
SUT SILK 2 0 SH CR/8 (SUTURE) ×1 IMPLANT
SUT VIC AB 2-0 CT1 27 (SUTURE) ×4
SUT VIC AB 2-0 CT1 TAPERPNT 27 (SUTURE) ×1 IMPLANT
SUT VIC AB 3-0 SH 27 (SUTURE) ×2
SUT VIC AB 3-0 SH 27X BRD (SUTURE) ×2 IMPLANT
TOWEL GREEN STERILE (TOWEL DISPOSABLE) ×1 IMPLANT
UNDERPAD 30X36 HEAVY ABSORB (UNDERPADS AND DIAPERS) ×1 IMPLANT
WATER STERILE IRR 1000ML POUR (IV SOLUTION) ×1 IMPLANT

## 2023-02-02 NOTE — Op Note (Signed)
NAME: Michelle Abbott    MRN: [id number] DOB: [birth year]    DATE OF OPERATION: 02/02/2023  PREOP DIAGNOSIS:    Chronic kidney disease stage 5  POSTOP DIAGNOSIS:    Same  PROCEDURE:    Left arm brachiobasilic fistula transposition  SURGEON: Victorino Sparrow  ASSIST: Kayren Eaves  ANESTHESIA: General   EBL: 15ml  INDICATIONS:    Michelle Abbott is a 77 y.o. female with history of CKD5 s/p left arm fist stage brachiobasilic transposition. She is in need superficialization for use.   FINDINGS:    6mm brachiobasilic fistula  TECHNIQUE:   Patient was brought to the OR and laid in supine position.  Moderate anesthesia was induced. The patient was prepped and draped in standard fashion.  Lidocaine was brought to the field and a local block was performed.   The case began with left arm ultrasound fistula mapping.  Multiple branches were noted and marked.  Three longitudinal skip incisions were made along the course of the basilic vein with 5 cm bridges.  This was carried through the subcutaneous fat to the brachiobasilic fistula.  The fistula was mobilized and multiple branches ligated using 2-0 silk and clips.  Once mobilized, a gore tunneler was brought on to the field, and a subcutaneous tunnel was made along the medial aspect of the bicep. Next, the patient as heparinized, fistula marked, clamped and transected. The vein was pulled through the tunnel tract and reanastomosed using 6.0 prolene suture in running fashion. The wound bed was irrigated with saline, hemostasis achieved with suture and cautery. The wounds were closed with layers of vicryl suture with monocryl and dermabond at the skin. There was a palpable thrill in the fistula at case completion with excellent pulse in the wrist.     Given the complexity of the case,  the assistant was necessary in order to expedient the procedure and safely perform the technical aspects of the operation.  The assistant provided traction  and countertraction to assist with exposure of the artery and vein.  They also assisted with suture ligation of multiple venous branches.  They played a critical role in the anastomosis. These skills, especially following the Prolene suture for the anastomosis, could not have been adequately performed by a scrub tech assistant.   Victorino Sparrow, MD [facility] DATE OF DICTATION:   02/02/2023

## 2023-02-02 NOTE — H&P (Signed)
POST OPERATIVE OFFICE NOTE  Patient seen and examined in preop holding.  No complaints. No changes to medication history or physical exam since last seen in clinic. After discussing the risks and benefits of left arm fistula transposition, Michelle Abbott elected to proceed.   Victorino Sparrow MD   CC:  F/u for surgery  HPI:  This is a 77 y.o. female who is s/p first stage basilic fistula creation on 12/08/22 by Dr. Karin Lieu.  She is not on HD she has CKD stage V.  Pt returns today for follow up.  Pt states she has no symptoms of steal.  No weakness or loss of sensation.     Allergies  Allergen Reactions   Ciprofloxacin Hcl Other (See Comments)    Burning at IV site   Metformin And Related Other (See Comments)    CKD    Ace Inhibitors Cough    Lisinopril   Amlodipine Swelling    Lower leg swelling.    Baclofen Nausea Only and Other (See Comments)    Disoriented, dizziness, weakness, fatigue.    Feraheme [Ferumoxytol] Other (See Comments)    Chest pain    Lisinopril Palpitations    Current Facility-Administered Medications  Medication Dose Route Frequency Provider Last Rate Last Admin   0.9 %  sodium chloride infusion   Intravenous Continuous Victorino Sparrow, MD       ceFAZolin (ANCEF) IVPB 2g/100 mL premix  2 g Intravenous 30 min Pre-Op Victorino Sparrow, MD       chlorhexidine (HIBICLENS) 4 % liquid 4 Application  60 mL Topical Once Victorino Sparrow, MD       And   [START ON 02/03/2023] chlorhexidine (HIBICLENS) 4 % liquid 4 Application  60 mL Topical Once Victorino Sparrow, MD       chlorhexidine (PERIDEX) 0.12 % solution            insulin aspart (novoLOG) injection 0-7 Units  0-7 Units Subcutaneous Q2H PRN Eilene Ghazi, MD         ROS:  See HPI  Physical Exam:    Incision:  healed Extremities:  palpable thrill near arterial anastomosis Palpable radial pulse  Findings:  +--------------------+----------+-----------------+--------+  AVF                PSV  (cm/s)Flow Vol (mL/min)Comments  +--------------------+----------+-----------------+--------+  Native artery inflow   172           683                 +--------------------+----------+-----------------+--------+  AVF Anastomosis        767                               +--------------------+----------+-----------------+--------+     +------------+----------+-------------+----------+--------+  OUTFLOW VEINPSV (cm/s)Diameter (cm)Depth (cm)Describe  +------------+----------+-------------+----------+--------+  Mid UA         211        0.56        1.09             +------------+----------+-------------+----------+--------+  Dist UA        226        0.46        1.04             +------------+----------+-------------+----------+--------+  AC Fossa       563        0.65        0.93             +------------+----------+-------------+----------+--------+  Summary:  Patent arteriovenous fistula.  Arteriovenous fistula-Elevated velocities suggestive of stenosis noted in  the  outflow vein at the [facility] fossa.  Volume flow=683 mL/min  Elevated velocities at the anastomosis (767cm/s).     Assessment/Plan:  This is a 77 y.o. female who is s/p: First stage basilic for CKD stage V  Her fistula is maturing well with flow rates > 600.  I will schedule her for second stage basilic transposition.  She is not on anticoagulation.       Victorino Sparrow PA-C [facility] [phone]   Clinic MD:  Karin Lieu

## 2023-02-02 NOTE — Transfer of Care (Signed)
Immediate Anesthesia Transfer of Care Note  Patient: Michelle Abbott  Procedure(s) Performed: LEFT ARM SECOND STAGE BASILIC VEIN TRANSPOSITION (Left: Arm Upper)  Patient Location: PACU  Anesthesia Type:General  Level of Consciousness: awake and patient cooperative  Airway & Oxygen Therapy: Patient Spontanous Breathing and Patient connected to face mask oxygen  Post-op Assessment: Report given to RN and Post -op Vital signs reviewed and stable  Post vital signs: Reviewed and stable  Last Vitals:  Vitals Value Taken Time  BP 162/61 02/02/23 0935  Temp    Pulse 72 02/02/23 0938  Resp 15 02/02/23 0938  SpO2 100 % 02/02/23 0938  Vitals shown include unfiled device data.  Last Pain:  Vitals:   02/02/23 0721  TempSrc:   PainSc: 6       Patients Stated Pain Goal: 3 (02/02/23 0721)  Complications: No notable events documented.

## 2023-02-02 NOTE — Anesthesia Postprocedure Evaluation (Signed)
Anesthesia Post Note  Patient: Michelle Abbott  Procedure(s) Performed: LEFT ARM SECOND STAGE BASILIC VEIN TRANSPOSITION (Left: Arm Upper)     Patient location during evaluation: PACU Anesthesia Type: General Level of consciousness: awake and alert Pain management: pain level controlled Vital Signs Assessment: post-procedure vital signs reviewed and stable Respiratory status: spontaneous breathing, nonlabored ventilation, respiratory function stable and patient connected to nasal cannula oxygen Cardiovascular status: blood pressure returned to baseline and stable Postop Assessment: no apparent nausea or vomiting Anesthetic complications: no  No notable events documented.  Last Vitals:  Vitals:   02/02/23 0935 02/02/23 0945  BP: (!) 162/61 (!) 163/52  Pulse:  67  Resp: 19 (!) 9  Temp: 36.4 C   SpO2:  97%    Last Pain:  Vitals:   02/02/23 0721  TempSrc:   PainSc: 6                  Maryna Yeagle S

## 2023-02-02 NOTE — Discharge Instructions (Signed)
[facility]  Discharge Instructions  AV Fistula or Graft Surgery for Dialysis Access  Please refer to the following instructions for your post-procedure care. Your surgeon or physician assistant will discuss any changes with you.  Activity  You may drive the day following your surgery, if you are comfortable and no longer taking prescription pain medication. Resume full activity as the soreness in your incision resolves.  Bathing/Showering  You may shower after you go home. Keep your incision dry for 48 hours. Do not soak in a bathtub, hot tub, or swim until the incision heals completely. You may not shower if you have a hemodialysis catheter.  Incision Care  Clean your incision with mild soap and water after 48 hours. Pat the area dry with a clean towel. You do not need a bandage unless otherwise instructed. Do not apply any ointments or creams to your incision. You may have skin glue on your incision. Do not peel it off. It will come off on its own in about one week. Your arm may swell a bit after surgery. To reduce swelling use pillows to elevate your arm so it is above your heart. Your doctor will tell you if you need to lightly wrap your arm with an ACE bandage.  Diet  Resume your normal diet. There are not special food restrictions following this procedure. In order to heal from your surgery, it is CRITICAL to get adequate nutrition. Your body requires vitamins, minerals, and protein. Vegetables are the best source of vitamins and minerals. Vegetables also provide the perfect balance of protein. Processed food has little nutritional value, so try to avoid this.  Medications  Resume taking all of your medications. If your incision is causing pain, you may take over-the counter pain relievers such as acetaminophen (Tylenol). If you were prescribed a stronger pain medication, please be aware these medications can cause nausea and constipation. Prevent  nausea by taking the medication with a snack or meal. Avoid constipation by drinking plenty of fluids and eating foods with high amount of fiber, such as fruits, vegetables, and grains.  Do not take Tylenol if you are taking prescription pain medications.  Follow up Your surgeon may want to see you in the office following your access surgery. If so, this will be arranged at the time of your surgery.  Please call us immediately for any of the following conditions:  Increased pain, redness, drainage (pus) from your incision site Fever of 101 degrees or higher Severe or worsening pain at your incision site Hand pain or numbness.  Reduce your risk of vascular disease:  Stop smoking. If you would like help, call [facility] at 1-800-QUIT-NOW (9287423021) or [place] at [phone]  Manage your cholesterol Maintain a desired weight Control your diabetes Keep your blood pressure down  Dialysis  It will take several weeks to several months for your new dialysis access to be ready for use. Your surgeon will determine when it is okay to use it. Your nephrologist will continue to direct your dialysis. You can continue to use your Permcath until your new access is ready for use.   02/02/2023 Michelle Abbott [id number] [birth year]  Surgeon(s): Victorino Sparrow, MD  Procedure(s): LEFT ARM SECOND STAGE BASILIC VEIN TRANSPOSITION   May stick graft immediately   May stick graft on designated area only:   x Do not stick fistula for 4 weeks    If you have any questions, please call the office at [phone].

## 2023-02-02 NOTE — Anesthesia Procedure Notes (Signed)
Procedure Name: LMA Insertion Date/Time: 02/02/2023 7:49 AM  Performed by: April Holding, CRNAPre-anesthesia Checklist: Patient identified, Emergency Drugs available, Suction available and Patient being monitored Patient Re-evaluated:Patient Re-evaluated prior to induction Oxygen Delivery Method: Circle System Utilized Preoxygenation: Pre-oxygenation with 100% oxygen Induction Type: IV induction Ventilation: Mask ventilation without difficulty LMA: LMA inserted LMA Size: 4.0 Number of attempts: 1 Airway Equipment and Method: Bite block Placement Confirmation: positive ETCO2 Tube secured with: Tape Dental Injury: Teeth and Oropharynx as per pre-operative assessment

## 2023-02-03 ENCOUNTER — Encounter (HOSPITAL_COMMUNITY): Payer: Self-pay | Admitting: Vascular Surgery

## 2023-02-22 DIAGNOSIS — H3582 Retinal ischemia: Secondary | ICD-10-CM | POA: Diagnosis not present

## 2023-02-25 ENCOUNTER — Ambulatory Visit (INDEPENDENT_AMBULATORY_CARE_PROVIDER_SITE_OTHER): Payer: 59 | Admitting: Physician Assistant

## 2023-02-25 VITALS — BP 165/70 | HR 75 | Temp 98.2°F | Ht 63.0 in | Wt 174.7 lb

## 2023-02-25 DIAGNOSIS — N185 Chronic kidney disease, stage 5: Secondary | ICD-10-CM

## 2023-02-25 NOTE — Progress Notes (Signed)
    Postoperative Access Visit   History of Present Illness   Michelle Abbott is a 77 y.o. year old female who presents for postoperative follow-up for: left second stage basilic vein transposition (Date: 02/02/23).  The patient's wounds are healed.  The patient denies steal symptoms.  The patient is able to complete their activities of daily living.  She is not yet on HD.  CKD is managed by Dr. Allena Katz.   Physical Examination   Vitals:   02/25/23 1023  BP: (!) 165/70  Pulse: 75  Temp: 98.2 F (36.8 C)  SpO2: 97%  Weight: 174 lb 11.2 oz (79.2 kg)  Height: 5\' 3"  (1.6 m)   Body mass index is 30.95 kg/m.  left arm Incisions are healed, palpable radial pulse, hand grip is 5/5, sensation in digits is intact, palpable thrill, bruit can be auscultated     Medical Decision Making   Michelle Abbott is a 77 y.o. year old female who presents s/p left second stage basilic vein transposition  Patent basilic fistula without signs or symptoms of steal syndrome The patient's access will be ready for use 03/04/23 The patient may follow up on a prn basis   Emilie Rutter PA-C [facility] [place] Office: [phone]  Clinic MD: Karin Lieu

## 2023-02-28 ENCOUNTER — Other Ambulatory Visit: Payer: Self-pay | Admitting: Family Medicine

## 2023-02-28 DIAGNOSIS — E1159 Type 2 diabetes mellitus with other circulatory complications: Secondary | ICD-10-CM

## 2023-03-11 ENCOUNTER — Other Ambulatory Visit: Payer: Self-pay | Admitting: Family Medicine

## 2023-03-11 DIAGNOSIS — M1A39X0 Chronic gout due to renal impairment, multiple sites, without tophus (tophi): Secondary | ICD-10-CM

## 2023-03-16 DIAGNOSIS — N185 Chronic kidney disease, stage 5: Secondary | ICD-10-CM | POA: Diagnosis not present

## 2023-03-16 DIAGNOSIS — N2581 Secondary hyperparathyroidism of renal origin: Secondary | ICD-10-CM | POA: Diagnosis not present

## 2023-03-16 DIAGNOSIS — D631 Anemia in chronic kidney disease: Secondary | ICD-10-CM | POA: Diagnosis not present

## 2023-03-16 DIAGNOSIS — N189 Chronic kidney disease, unspecified: Secondary | ICD-10-CM | POA: Diagnosis not present

## 2023-03-16 DIAGNOSIS — I120 Hypertensive chronic kidney disease with stage 5 chronic kidney disease or end stage renal disease: Secondary | ICD-10-CM | POA: Diagnosis not present

## 2023-04-01 ENCOUNTER — Other Ambulatory Visit: Payer: Self-pay | Admitting: Family Medicine

## 2023-04-01 DIAGNOSIS — M1A39X0 Chronic gout due to renal impairment, multiple sites, without tophus (tophi): Secondary | ICD-10-CM

## 2023-04-07 ENCOUNTER — Other Ambulatory Visit: Payer: Self-pay | Admitting: Family Medicine

## 2023-04-07 DIAGNOSIS — E1140 Type 2 diabetes mellitus with diabetic neuropathy, unspecified: Secondary | ICD-10-CM

## 2023-04-21 ENCOUNTER — Encounter: Payer: Self-pay | Admitting: Family Medicine

## 2023-04-21 NOTE — Progress Notes (Signed)
Attempted to reach patient. No answer. LVM for patient to call the office to make appt for UTI management. Aquilla Solian, CMA

## 2023-04-21 NOTE — Progress Notes (Signed)
Hello [place] CMA,  Please contact the patient to schedule a follow-up with the PCP for UTI management. We received an external lab request for a UA test. We will not order or manage external test results.  If the patient cannot come in, they can bring in urine samples at any time or PCP to treat based on symptoms after completing a virtual or in-person visit. If scheduling a future lab appointment, remind PCP to place a future lab order.  Thanks.

## 2023-05-04 ENCOUNTER — Ambulatory Visit (INDEPENDENT_AMBULATORY_CARE_PROVIDER_SITE_OTHER): Payer: 59 | Admitting: Student

## 2023-05-04 VITALS — BP 167/64 | HR 70 | Ht 63.0 in | Wt 172.4 lb

## 2023-05-04 DIAGNOSIS — I10 Essential (primary) hypertension: Secondary | ICD-10-CM | POA: Diagnosis not present

## 2023-05-04 DIAGNOSIS — N952 Postmenopausal atrophic vaginitis: Secondary | ICD-10-CM

## 2023-05-04 DIAGNOSIS — Z23 Encounter for immunization: Secondary | ICD-10-CM

## 2023-05-04 DIAGNOSIS — N898 Other specified noninflammatory disorders of vagina: Secondary | ICD-10-CM

## 2023-05-04 LAB — POCT WET PREP (WET MOUNT)
Clue Cells Wet Prep Whiff POC: NEGATIVE
Trichomonas Wet Prep HPF POC: ABSENT

## 2023-05-04 MED ORDER — FLUCONAZOLE 150 MG PO TABS
ORAL_TABLET | ORAL | 0 refills | Status: DC
Start: 2023-05-04 — End: 2023-06-08

## 2023-05-04 NOTE — Progress Notes (Unsigned)
    SUBJECTIVE:   CHIEF COMPLAINT / HPI:   Vaginal Discharge White clumpy discharge that smells like eggs for a week or so. Does have a history of BV. Tells me she has also had recurrent yeast infections since she became incontinent of urine a few years ago. Worried she has another yeast infection. Hasn't tried anything for it. Not sexually active. Declines STI testing. Offered empiric treatment versus exam and wet prep evaluation, shared decision making to pursue exam and wet prep.   Has a history of DM, but it is quite well controlled (last A1c 5.9%).   Hypertension Note elevated pressures x2 today. She tells me that she did not take her meds like she usually does (early morning) and instead just took them prior to coming in.   OBJECTIVE:   BP (!) 167/64   Pulse 70   Ht 5\' 3"  (1.6 m)   Wt 172 lb 6.4 oz (78.2 kg)   SpO2 100%   BMI 30.54 kg/m   Gen: Well-appearing, appears stated age, in good spirits and NAD Abd: Lots of loose skin, soft, non-tender GU: Significant atrophy of vaginal tissue. Intolerant of speculum exam. Wet prep swab obtained blindly.   ASSESSMENT/PLAN:     Assessment & Plan Vaginal discharge Negative wet prep, and no discharge noted on limited exam, but given her description of the discharge and her incontinence, I think covering for yeast makes sense.  - Diflucan x2 - She will return if symptoms persist, could consider empiric BV coverage vs more of a workup. Her atrophic vaginitis would explain a number of pelvic complaints, but not clumping white discharge  Primary hypertension Elevated x2 today, won't make any medication adjustments today given she just took her meds prior to coming into the office. Advised her to follow-up with PCP in 2 weeks for a BP check and to take her meds as usual on that day.      Eliezer Mccoy, MD [facility]

## 2023-05-04 NOTE — Patient Instructions (Addendum)
Ms. Tenaj, [place] to meet you, your wet prep (microscope section) was negative. However, based on your description, I think this is most likely yeast, so let's treat it as yeast. If the irritation does not get better, please come back to see Dr. Velna Ochs to discuss treatment for "atrophic vaginitis".  Come back in 2-4 weeks for a  BP check with Dr. Velna Ochs. Take your medicine like you usually do before that visit.   Eliezer Mccoy, MD

## 2023-05-05 DIAGNOSIS — N952 Postmenopausal atrophic vaginitis: Secondary | ICD-10-CM | POA: Insufficient documentation

## 2023-05-10 ENCOUNTER — Other Ambulatory Visit: Payer: Self-pay | Admitting: Family Medicine

## 2023-05-10 DIAGNOSIS — E782 Mixed hyperlipidemia: Secondary | ICD-10-CM

## 2023-05-25 ENCOUNTER — Ambulatory Visit: Payer: 59 | Admitting: Family Medicine

## 2023-05-25 DIAGNOSIS — N185 Chronic kidney disease, stage 5: Secondary | ICD-10-CM | POA: Diagnosis not present

## 2023-05-25 DIAGNOSIS — I129 Hypertensive chronic kidney disease with stage 1 through stage 4 chronic kidney disease, or unspecified chronic kidney disease: Secondary | ICD-10-CM | POA: Diagnosis not present

## 2023-05-25 DIAGNOSIS — D631 Anemia in chronic kidney disease: Secondary | ICD-10-CM | POA: Diagnosis not present

## 2023-05-25 DIAGNOSIS — N2581 Secondary hyperparathyroidism of renal origin: Secondary | ICD-10-CM | POA: Diagnosis not present

## 2023-05-25 DIAGNOSIS — I120 Hypertensive chronic kidney disease with stage 5 chronic kidney disease or end stage renal disease: Secondary | ICD-10-CM | POA: Diagnosis not present

## 2023-05-25 DIAGNOSIS — N189 Chronic kidney disease, unspecified: Secondary | ICD-10-CM | POA: Diagnosis not present

## 2023-05-26 LAB — LAB REPORT - SCANNED
EGFR: 11
PTH: 140

## 2023-05-28 ENCOUNTER — Ambulatory Visit: Payer: 59 | Admitting: Family Medicine

## 2023-05-28 ENCOUNTER — Other Ambulatory Visit: Payer: Self-pay

## 2023-05-28 DIAGNOSIS — E1140 Type 2 diabetes mellitus with diabetic neuropathy, unspecified: Secondary | ICD-10-CM

## 2023-05-28 DIAGNOSIS — E782 Mixed hyperlipidemia: Secondary | ICD-10-CM

## 2023-05-28 DIAGNOSIS — E1159 Type 2 diabetes mellitus with other circulatory complications: Secondary | ICD-10-CM

## 2023-05-28 DIAGNOSIS — M1A39X0 Chronic gout due to renal impairment, multiple sites, without tophus (tophi): Secondary | ICD-10-CM

## 2023-05-28 MED ORDER — ALLOPURINOL 100 MG PO TABS: ORAL_TABLET | ORAL | 0 refills | Status: AC

## 2023-05-28 MED ORDER — SPIRONOLACTONE 25 MG PO TABS
25.0000 mg | ORAL_TABLET | Freq: Every day | ORAL | 0 refills | Status: DC
Start: 2023-05-28 — End: 2023-09-01

## 2023-06-08 ENCOUNTER — Ambulatory Visit: Payer: 59 | Admitting: Family Medicine

## 2023-06-08 ENCOUNTER — Encounter: Payer: Self-pay | Admitting: Family Medicine

## 2023-06-08 VITALS — BP 175/74 | HR 70 | Ht 63.0 in | Wt 173.6 lb

## 2023-06-08 DIAGNOSIS — Z794 Long term (current) use of insulin: Secondary | ICD-10-CM | POA: Diagnosis not present

## 2023-06-08 DIAGNOSIS — E1140 Type 2 diabetes mellitus with diabetic neuropathy, unspecified: Secondary | ICD-10-CM

## 2023-06-08 DIAGNOSIS — N185 Chronic kidney disease, stage 5: Secondary | ICD-10-CM

## 2023-06-08 DIAGNOSIS — I10 Essential (primary) hypertension: Secondary | ICD-10-CM

## 2023-06-08 LAB — POCT GLYCOSYLATED HEMOGLOBIN (HGB A1C): HbA1c, POC (controlled diabetic range): 5.4 % (ref 0.0–7.0)

## 2023-06-08 MED ORDER — POLYETHYLENE GLYCOL 3350 17 GM/SCOOP PO POWD: 17.0000 g | Freq: Every day | ORAL | 0 refills | Status: AC | PRN

## 2023-06-08 MED ORDER — SEMAGLUTIDE(0.25 OR 0.5MG/DOS) 2 MG/1.5ML ~~LOC~~ SOPN
0.5000 mg | PEN_INJECTOR | SUBCUTANEOUS | 3 refills | Status: DC
Start: 2023-06-08 — End: 2023-07-22

## 2023-06-08 NOTE — Assessment & Plan Note (Addendum)
Will get RFP as above.  Of note she remains on Lasix, spironolactone, and losartan at this time.  No emergent needs for dialysis identified at this time.

## 2023-06-08 NOTE — Progress Notes (Signed)
    SUBJECTIVE:   CHIEF COMPLAINT / HPI:   HTN: Does not check BP at home. Denies headache, N/V, SOB, chest pain, blurry vision. Is feeling a bit lightheaded but has not eaten today.  Reports she took all her blood pressure medicines today.  DM: Checks at home and states that average reading is 114. Wants to get lower dose of ozempic because she doesn't want to lose anymore weight.  CKD Stage 5: Fistula was placed. Doesn't feel like she needs dialysis yet. Still urinating.   PERTINENT  PMH / PSH: CKD Stage 5, T2DM, HTN  OBJECTIVE:   BP (!) 175/74   Pulse 70   Ht 5\' 3"  (1.6 m)   Wt 173 lb 9.6 oz (78.7 kg)   SpO2 100%   BMI 30.75 kg/m   General: NAD, well appearing Neuro: A&O, pupils equal and reactive Cardiovascular: RRR, no murmurs, trace peripheral edema bilateral Respiratory: normal WOB on RA, CTAB, no wheezes, ronchi or rales Abdomen: soft, NTTP, no rebound or guarding Extremities: Moving all 4 extremities equally    ASSESSMENT/PLAN:  Michelle Abbott is a 78 year old with PMHx of HTN, CKD 5 presenting for follow up.  Assessment & Plan Primary hypertension Uncontrolled, with severely elevated systolics x 2 today in clinic.  This is likely 2/2 worsening renovascular pressure from CKD 5.  Will repeat renal function panel today given extent of initial elevation systolically to 210.  Reassuringly, she denies any symptoms concerning for hypertensive emergency, and she is not volume overloaded on exam.  As she does not have evidence of endorgan damage today, will defer to nephrology for recommendations regarding her HTN due to the progression of her kidney disease. Counseled patient to check her BP routinely at home.  Recommended calling clinic if her blood pressures is over 200 systolic. Type 2 diabetes mellitus with diabetic neuropathy, with long-term current use of insulin (HCC) Controlled, A1C of 5.4.  Shared decision making with patient regarding decreasing Ozempic dose at her  request.  Discussed that it may benefit her to lose more weight however there is no mortality benefit with weight loss at her age.  Appropriate to decrease Ozempic dose to 0.5 mg/week. - decrease ozempic dose to 0.5 mg/ week Chronic kidney disease, stage 5 (HCC) Will get RFP as above.  Of note she remains on Lasix, spironolactone, and losartan at this time.  No emergent needs for dialysis identified at this time.  Return in about 1 month (around 07/09/2023).  Oscar La, Medical Student [facility]   I was personally present and performed or re-performed the history, physical exam and medical decision making activities of this student/resident and have verified that the student/resident's  findings are accurately documented in the student/resident's note. I have made edits and changes where appropriate, and agree with plan.  Celine Mans, MD, PGY-2 [facility] 4:42 PM 06/08/2023                   06/08/2023, 4:42 PM

## 2023-06-08 NOTE — Patient Instructions (Signed)
It was great to see you! Thank you for allowing me to participate in your care!  Our plans for today:  - I have reduced your Ozempic to 0.5mg  per week. - Please make sure to go to your Kidney appointment in February.   Please arrive 15 minutes PRIOR to your next scheduled appointment time! If you do not, this affects OTHER patients' care.  Take care and seek immediate care sooner if you develop any concerns.   Celine Mans, MD, PGY-2 [facility] 2:40 PM 06/08/2023  [facility]

## 2023-06-08 NOTE — Assessment & Plan Note (Addendum)
Controlled, A1C of 5.4.  Shared decision making with patient regarding decreasing Ozempic dose at her request.  Discussed that it may benefit her to lose more weight however there is no mortality benefit with weight loss at her age.  Appropriate to decrease Ozempic dose to 0.5 mg/week. - decrease ozempic dose to 0.5 mg/ week

## 2023-06-08 NOTE — Progress Notes (Deleted)
    SUBJECTIVE:   CHIEF COMPLAINT / HPI:   ***  PERTINENT  PMH / PSH: CKD Stage 5, T2DM, HTN  OBJECTIVE:   BP (!) 218/81   Pulse 70   Ht 5\' 3"  (1.6 m)   Wt 173 lb 9.6 oz (78.7 kg)   SpO2 100%   BMI 30.75 kg/m   ***  ASSESSMENT/PLAN:   Assessment & Plan Type 2 diabetes mellitus with diabetic neuropathy, with long-term current use of insulin (HCC)  No follow-ups on file.  Celine Mans, MD [facility]

## 2023-06-09 ENCOUNTER — Encounter: Payer: Self-pay | Admitting: Family Medicine

## 2023-06-09 LAB — RENAL FUNCTION PANEL
Albumin: 3.7 g/dL — ABNORMAL LOW (ref 3.8–4.8)
BUN/Creatinine Ratio: 12 (ref 12–28)
BUN: 54 mg/dL — ABNORMAL HIGH (ref 8–27)
CO2: 21 mmol/L (ref 20–29)
Calcium: 9.4 mg/dL (ref 8.7–10.3)
Chloride: 108 mmol/L — ABNORMAL HIGH (ref 96–106)
Creatinine, Ser: 4.37 mg/dL — ABNORMAL HIGH (ref 0.57–1.00)
Glucose: 92 mg/dL (ref 70–99)
Phosphorus: 4.3 mg/dL (ref 3.0–4.3)
Potassium: 4.1 mmol/L (ref 3.5–5.2)
Sodium: 144 mmol/L (ref 134–144)
eGFR: 10 mL/min/{1.73_m2} — ABNORMAL LOW (ref >59)

## 2023-06-09 LAB — CBC
Hematocrit: 29.8 % — ABNORMAL LOW (ref 34.0–46.6)
Hemoglobin: 8.8 g/dL — ABNORMAL LOW (ref 11.1–15.9)
MCH: 26.1 pg — ABNORMAL LOW (ref 26.6–33.0)
MCHC: 29.5 g/dL — ABNORMAL LOW (ref 31.5–35.7)
MCV: 88 fL (ref 79–97)
Platelets: 186 10*3/uL (ref 150–450)
RBC: 3.37 x10E6/uL — ABNORMAL LOW (ref 3.77–5.28)
RDW: 14.2 % (ref 11.7–15.4)
WBC: 3.7 10*3/uL (ref 3.4–10.8)

## 2023-06-10 DIAGNOSIS — R350 Frequency of micturition: Secondary | ICD-10-CM | POA: Diagnosis not present

## 2023-06-23 ENCOUNTER — Encounter: Payer: Self-pay | Admitting: Family Medicine

## 2023-06-23 ENCOUNTER — Ambulatory Visit: Payer: 59 | Admitting: Family Medicine

## 2023-06-23 VITALS — BP 150/80 | HR 77 | Ht 63.0 in | Wt 164.4 lb

## 2023-06-23 DIAGNOSIS — J029 Acute pharyngitis, unspecified: Secondary | ICD-10-CM | POA: Diagnosis not present

## 2023-06-23 DIAGNOSIS — N390 Urinary tract infection, site not specified: Secondary | ICD-10-CM | POA: Insufficient documentation

## 2023-06-23 DIAGNOSIS — R300 Dysuria: Secondary | ICD-10-CM | POA: Diagnosis not present

## 2023-06-23 DIAGNOSIS — N3001 Acute cystitis with hematuria: Secondary | ICD-10-CM | POA: Diagnosis not present

## 2023-06-23 DIAGNOSIS — E1159 Type 2 diabetes mellitus with other circulatory complications: Secondary | ICD-10-CM | POA: Diagnosis not present

## 2023-06-23 DIAGNOSIS — I152 Hypertension secondary to endocrine disorders: Secondary | ICD-10-CM

## 2023-06-23 DIAGNOSIS — N898 Other specified noninflammatory disorders of vagina: Secondary | ICD-10-CM | POA: Diagnosis not present

## 2023-06-23 DIAGNOSIS — B3731 Acute candidiasis of vulva and vagina: Secondary | ICD-10-CM

## 2023-06-23 LAB — POCT WET PREP (WET MOUNT)
Clue Cells Wet Prep Whiff POC: NEGATIVE
Trichomonas Wet Prep HPF POC: ABSENT
WBC, Wet Prep HPF POC: NONE SEEN

## 2023-06-23 LAB — POCT URINALYSIS DIP (MANUAL ENTRY)
Bilirubin, UA: NEGATIVE
Glucose, UA: NEGATIVE mg/dL
Ketones, POC UA: NEGATIVE mg/dL
Nitrite, UA: POSITIVE — AB
Protein Ur, POC: >=300 mg/dL — AB
Spec Grav, UA: 1.02 (ref 1.010–1.025)
Urobilinogen, UA: 0.2 U/dL
pH, UA: 6 (ref 5.0–8.0)

## 2023-06-23 LAB — POCT UA - MICROSCOPIC ONLY
RBC, Urine, Miroscopic: >20 (ref 0–2)
WBC, Ur, HPF, POC: >20 (ref 0–5)

## 2023-06-23 MED ORDER — AMOXICILLIN-POT CLAVULANATE 250-125 MG PO TABS: 1.0000 | ORAL_TABLET | Freq: Two times a day (BID) | ORAL | 0 refills | Status: DC

## 2023-06-23 MED ORDER — FLUCONAZOLE 150 MG PO TABS
150.0000 mg | ORAL_TABLET | Freq: Every day | ORAL | 0 refills | Status: DC
Start: 2023-06-23 — End: 2023-06-27

## 2023-06-23 NOTE — Assessment & Plan Note (Signed)
 History of frequent yeast infections, does wear a diaper due to urinary incontinence.  Unable to tolerate speculum exam today, did not tolerate swabs well either.  Wet prep negative however I do not think that I got a good sample.  Given her description of symptoms, and that they are consistent with previous yeast infection we will treat with Diflucan  150mg  x1 dose - Advised to call the office if her symptoms do not improve, could consider treatment for BV at that time

## 2023-06-23 NOTE — Patient Instructions (Signed)
 It was great to see you today! Thank you for choosing [facility] for your primary care. Michelle Abbott was seen for UTI, vaginal discharge.  Today we addressed: I am sending Augmentin  to your pharmacy to take for your urinary tract infection.  If your symptoms do not improve please give our office a call. I am also sending Diflucan  to take for a presumed yeast infection.  Please call if your symptoms do not improve. Take honey and continue salt water gargles for your sore throat, I am glad that this is getting better.  If you haven't already, sign up for My Chart to have easy access to your labs results, and communication with your primary care physician.  Call the clinic at [phone] if your symptoms worsen or you have any concerns.  Please arrive 15 minutes before your appointment to ensure smooth check in process.  We appreciate your efforts in making this happen.  Thank you for allowing me to participate in your care, Elyce Prescott, DO 06/23/2023, 10:47 AM PGY-1, [facility]

## 2023-06-23 NOTE — Assessment & Plan Note (Signed)
 Blood pressure elevated today but she did not take her blood pressure medications yet this morning.  Asymptomatic - Advised to take her medication when she went home

## 2023-06-23 NOTE — Assessment & Plan Note (Signed)
 Dysuria and frequency x 3 days.  UA today with small leukocytes, positive nitrites, red blood cells.  Urine culture sent.  Last urine culture grew E. coli sensitive to everything except for ceftriaxone .  She is allergic to fluoroquinolones.  Also hesitant to give Macrobid or Bactrim  due to CKD 5. - Renally dosed Augmentin  250-125mg  BID x5 days - Advised to call office if her symptoms worsen or do not improve in the next few days

## 2023-06-23 NOTE — Assessment & Plan Note (Signed)
 Improving,

## 2023-06-23 NOTE — Progress Notes (Signed)
    SUBJECTIVE:   CHIEF COMPLAINT / HPI:   Vaginal discharge and dysuria -History of BV and yeast infection, seen 05/04/2023 for vaginal discharge and was treated for yeast infection with Diflucan  at that time -Recurrent yeast infections since become incontinent of urine a few years ago -Sx improved last time with diflucan , Came back 2-3 weeks later - describes it as thick yellow/white discharge -Wears pad for incontinence, Been incontinent x4 years since back surgery -Burning with urination x3 days and Urinary frequency - No abdominal pain, hematuria, vaginal bleeding, fever  Also reports sore throat and dry cough for the last week, states that her symptoms are improving and salt water gargles help  PERTINENT  PMH / PSH:  Hypertension Type 2 diabetes - A1C 5.4 05/2023 CKD5 Atrophic vaginitis  OBJECTIVE:   BP (!) 150/80   Pulse 77   Ht 5' 3 (1.6 m)   Wt 164 lb 6 oz (74.6 kg)   SpO2 100%   BMI 29.12 kg/m   GEN: Well-appearing, no acute distress HEENT: No oropharyngeal erythema, no tonsillar exudate Pelvic - Tashira, CMA chaperone. VULVA: normal appearing vulva with no masses, tenderness or lesions.  No visible vaginal discharge or bleeding.  Unable to complete speculum exam due to pain.   ASSESSMENT/PLAN:   Hypertension associated with diabetes (HCC) Blood pressure elevated today but she did not take her blood pressure medications yet this morning.  Asymptomatic - Advised to take her medication when she went home  Urinary tract infection Dysuria and frequency x 3 days.  UA today with small leukocytes, positive nitrites, red blood cells.  Urine culture sent.  Last urine culture grew E. coli sensitive to everything except for ceftriaxone .  She is allergic to fluoroquinolones.  Also hesitant to give Macrobid or Bactrim  due to CKD 5. - Renally dosed Augmentin  250-125mg  BID x5 days - Advised to call office if her symptoms worsen or do not improve in the next few days  Yeast  infection of the vagina History of frequent yeast infections, does wear a diaper due to urinary incontinence.  Unable to tolerate speculum exam today, did not tolerate swabs well either.  Wet prep negative however I do not think that I got a good sample.  Given her description of symptoms, and that they are consistent with previous yeast infection we will treat with Diflucan  150mg  x1 dose - Advised to call the office if her symptoms do not improve, could consider treatment for BV at that time     Braelyn Bordonaro, DO [facility]

## 2023-06-26 ENCOUNTER — Emergency Department (HOSPITAL_COMMUNITY): Payer: 59

## 2023-06-26 ENCOUNTER — Encounter (HOSPITAL_COMMUNITY): Payer: Self-pay

## 2023-06-26 ENCOUNTER — Observation Stay (HOSPITAL_COMMUNITY)
Admission: EM | Admit: 2023-06-26 | Discharge: 2023-06-27 | Disposition: A | Discharge status: Home / Self Care | Payer: 59 | Attending: Pulmonary Disease | Admitting: Pulmonary Disease

## 2023-06-26 ENCOUNTER — Other Ambulatory Visit: Payer: Self-pay

## 2023-06-26 DIAGNOSIS — N184 Chronic kidney disease, stage 4 (severe): Secondary | ICD-10-CM | POA: Insufficient documentation

## 2023-06-26 DIAGNOSIS — E119 Type 2 diabetes mellitus without complications: Secondary | ICD-10-CM | POA: Diagnosis not present

## 2023-06-26 DIAGNOSIS — I6523 Occlusion and stenosis of bilateral carotid arteries: Secondary | ICD-10-CM | POA: Diagnosis not present

## 2023-06-26 DIAGNOSIS — D638 Anemia in other chronic diseases classified elsewhere: Secondary | ICD-10-CM | POA: Diagnosis not present

## 2023-06-26 DIAGNOSIS — I129 Hypertensive chronic kidney disease with stage 1 through stage 4 chronic kidney disease, or unspecified chronic kidney disease: Secondary | ICD-10-CM | POA: Diagnosis not present

## 2023-06-26 DIAGNOSIS — I120 Hypertensive chronic kidney disease with stage 5 chronic kidney disease or end stage renal disease: Secondary | ICD-10-CM | POA: Insufficient documentation

## 2023-06-26 DIAGNOSIS — J0510 Acute epiglottitis without obstruction: Principal | ICD-10-CM | POA: Insufficient documentation

## 2023-06-26 DIAGNOSIS — Z87891 Personal history of nicotine dependence: Secondary | ICD-10-CM | POA: Diagnosis not present

## 2023-06-26 DIAGNOSIS — E1169 Type 2 diabetes mellitus with other specified complication: Secondary | ICD-10-CM | POA: Diagnosis present

## 2023-06-26 DIAGNOSIS — Z79899 Other long term (current) drug therapy: Secondary | ICD-10-CM | POA: Insufficient documentation

## 2023-06-26 DIAGNOSIS — I10 Essential (primary) hypertension: Secondary | ICD-10-CM

## 2023-06-26 DIAGNOSIS — N185 Chronic kidney disease, stage 5: Secondary | ICD-10-CM | POA: Diagnosis not present

## 2023-06-26 DIAGNOSIS — M799 Soft tissue disorder, unspecified: Secondary | ICD-10-CM | POA: Diagnosis not present

## 2023-06-26 DIAGNOSIS — E1122 Type 2 diabetes mellitus with diabetic chronic kidney disease: Secondary | ICD-10-CM | POA: Insufficient documentation

## 2023-06-26 DIAGNOSIS — E1140 Type 2 diabetes mellitus with diabetic neuropathy, unspecified: Secondary | ICD-10-CM

## 2023-06-26 DIAGNOSIS — M542 Cervicalgia: Secondary | ICD-10-CM | POA: Diagnosis not present

## 2023-06-26 DIAGNOSIS — M1990 Unspecified osteoarthritis, unspecified site: Secondary | ICD-10-CM | POA: Diagnosis present

## 2023-06-26 DIAGNOSIS — E1159 Type 2 diabetes mellitus with other circulatory complications: Secondary | ICD-10-CM | POA: Diagnosis present

## 2023-06-26 DIAGNOSIS — R220 Localized swelling, mass and lump, head: Secondary | ICD-10-CM | POA: Diagnosis not present

## 2023-06-26 DIAGNOSIS — M109 Gout, unspecified: Secondary | ICD-10-CM | POA: Diagnosis present

## 2023-06-26 DIAGNOSIS — D631 Anemia in chronic kidney disease: Secondary | ICD-10-CM | POA: Diagnosis present

## 2023-06-26 DIAGNOSIS — R0602 Shortness of breath: Secondary | ICD-10-CM | POA: Diagnosis present

## 2023-06-26 LAB — CBC
HCT: 30.7 % — ABNORMAL LOW (ref 36.0–46.0)
Hemoglobin: 9 g/dL — ABNORMAL LOW (ref 12.0–15.0)
MCH: 26 pg (ref 26.0–34.0)
MCHC: 29.3 g/dL — ABNORMAL LOW (ref 30.0–36.0)
MCV: 88.7 fL (ref 80.0–100.0)
Platelets: 229 10*3/uL (ref 150–400)
RBC: 3.46 MIL/uL — ABNORMAL LOW (ref 3.87–5.11)
RDW: 15 % (ref 11.5–15.5)
WBC: 6.1 10*3/uL (ref 4.0–10.5)
nRBC: 0 % (ref 0.0–0.2)

## 2023-06-26 LAB — BASIC METABOLIC PANEL
Anion gap: 11 (ref 5–15)
BUN: 45 mg/dL — ABNORMAL HIGH (ref 8–23)
CO2: 20 mmol/L — ABNORMAL LOW (ref 22–32)
Calcium: 8.9 mg/dL (ref 8.9–10.3)
Chloride: 103 mmol/L (ref 98–111)
Creatinine, Ser: 4.36 mg/dL — ABNORMAL HIGH (ref 0.44–1.00)
GFR, Estimated: 10 mL/min — ABNORMAL LOW (ref >60)
Glucose, Bld: 101 mg/dL — ABNORMAL HIGH (ref 70–99)
Potassium: 3.8 mmol/L (ref 3.5–5.1)
Sodium: 134 mmol/L — ABNORMAL LOW (ref 135–145)

## 2023-06-26 LAB — RESP PANEL BY RT-PCR (RSV, FLU A&B, COVID)  RVPGX2
Influenza A by PCR: NEGATIVE
Influenza B by PCR: NEGATIVE
Resp Syncytial Virus by PCR: NEGATIVE
SARS Coronavirus 2 by RT PCR: NEGATIVE

## 2023-06-26 LAB — HEMOGLOBIN A1C
Hgb A1c MFr Bld: 5.4 % (ref 4.8–5.6)
Mean Plasma Glucose: 108.28 mg/dL

## 2023-06-26 LAB — CBC WITH DIFFERENTIAL/PLATELET
Abs Immature Granulocytes: 0.01 10*3/uL (ref 0.00–0.07)
Basophils Absolute: 0 10*3/uL (ref 0.0–0.1)
Basophils Relative: 1 %
Eosinophils Absolute: 0.2 10*3/uL (ref 0.0–0.5)
Eosinophils Relative: 3 %
HCT: 28.2 % — ABNORMAL LOW (ref 36.0–46.0)
Hemoglobin: 8.2 g/dL — ABNORMAL LOW (ref 12.0–15.0)
Immature Granulocytes: 0 %
Lymphocytes Relative: 18 %
Lymphs Abs: 1.1 10*3/uL (ref 0.7–4.0)
MCH: 26.1 pg (ref 26.0–34.0)
MCHC: 29.1 g/dL — ABNORMAL LOW (ref 30.0–36.0)
MCV: 89.8 fL (ref 80.0–100.0)
Monocytes Absolute: 0.7 10*3/uL (ref 0.1–1.0)
Monocytes Relative: 11 %
Neutro Abs: 4.4 10*3/uL (ref 1.7–7.7)
Neutrophils Relative %: 67 %
Platelets: 209 10*3/uL (ref 150–400)
RBC: 3.14 MIL/uL — ABNORMAL LOW (ref 3.87–5.11)
RDW: 15.1 % (ref 11.5–15.5)
WBC: 6.4 10*3/uL (ref 4.0–10.5)
nRBC: 0 % (ref 0.0–0.2)

## 2023-06-26 LAB — GROUP A STREP BY PCR: Group A Strep by PCR: NOT DETECTED

## 2023-06-26 LAB — URINE CULTURE

## 2023-06-26 LAB — GLUCOSE, CAPILLARY
Glucose-Capillary: 98 mg/dL (ref 70–99)
Glucose-Capillary: 143 mg/dL — ABNORMAL HIGH (ref 70–99)

## 2023-06-26 LAB — CREATININE, SERUM
Creatinine, Ser: 4.32 mg/dL — ABNORMAL HIGH (ref 0.44–1.00)
GFR, Estimated: 10 mL/min — ABNORMAL LOW (ref >60)

## 2023-06-26 LAB — MRSA NEXT GEN BY PCR, NASAL: MRSA by PCR Next Gen: NOT DETECTED

## 2023-06-26 MED ORDER — HYDRALAZINE HCL 20 MG/ML IJ SOLN
10.0000 mg | INTRAMUSCULAR | Status: DC | PRN
  Administered 2023-06-26: 10 mg via INTRAVENOUS
  Filled 2023-06-26: qty 1

## 2023-06-26 MED ORDER — SODIUM CHLORIDE 0.9 % IV SOLN
2.0000 g | INTRAVENOUS | Status: DC
  Administered 2023-06-27: 2 g via INTRAVENOUS
  Filled 2023-06-26: qty 20

## 2023-06-26 MED ORDER — CARVEDILOL 25 MG PO TABS
25.0000 mg | ORAL_TABLET | Freq: Two times a day (BID) | ORAL | Status: DC
  Administered 2023-06-26 – 2023-06-27 (×2): 25 mg via ORAL
  Filled 2023-06-26 (×2): qty 1

## 2023-06-26 MED ORDER — OXYBUTYNIN CHLORIDE ER 5 MG PO TB24
15.0000 mg | ORAL_TABLET | Freq: Every morning | ORAL | Status: DC
  Administered 2023-06-27: 15 mg via ORAL
  Filled 2023-06-26: qty 1

## 2023-06-26 MED ORDER — HYDRALAZINE HCL 20 MG/ML IJ SOLN
20.0000 mg | Freq: Once | INTRAMUSCULAR | Status: AC
  Administered 2023-06-26: 20 mg via INTRAVENOUS
  Filled 2023-06-26: qty 1

## 2023-06-26 MED ORDER — VANCOMYCIN HCL 2000 MG/400ML IV SOLN: 2000.0000 mg | Freq: Once | INTRAVENOUS | Status: DC

## 2023-06-26 MED ORDER — ORAL CARE MOUTH RINSE: 15.0000 mL | OROMUCOSAL | Status: DC | PRN

## 2023-06-26 MED ORDER — INSULIN ASPART 100 UNIT/ML IJ SOLN: 0.0000 [IU] | Freq: Three times a day (TID) | INTRAMUSCULAR | Status: DC

## 2023-06-26 MED ORDER — VITAMIN D 25 MCG (1000 UNIT) PO TABS
5000.0000 [IU] | ORAL_TABLET | Freq: Every day | ORAL | Status: DC
  Administered 2023-06-26 – 2023-06-27 (×2): 5000 [IU] via ORAL
  Filled 2023-06-26 (×2): qty 5

## 2023-06-26 MED ORDER — INSULIN ASPART 100 UNIT/ML IJ SOLN: 0.0000 [IU] | Freq: Every day | INTRAMUSCULAR | Status: DC

## 2023-06-26 MED ORDER — HYDRALAZINE HCL 20 MG/ML IJ SOLN
20.0000 mg | INTRAMUSCULAR | Status: DC | PRN
  Administered 2023-06-27: 20 mg via INTRAVENOUS
  Filled 2023-06-26: qty 1

## 2023-06-26 MED ORDER — DEXAMETHASONE SODIUM PHOSPHATE 10 MG/ML IJ SOLN
10.0000 mg | Freq: Once | INTRAMUSCULAR | Status: AC
  Administered 2023-06-26: 10 mg via INTRAVENOUS
  Filled 2023-06-26: qty 1

## 2023-06-26 MED ORDER — SODIUM CHLORIDE 0.9 % IV SOLN
2.0000 g | Freq: Once | INTRAVENOUS | Status: AC
  Administered 2023-06-26: 2 g via INTRAVENOUS
  Filled 2023-06-26: qty 20

## 2023-06-26 MED ORDER — HYDRALAZINE HCL 50 MG PO TABS
50.0000 mg | ORAL_TABLET | Freq: Four times a day (QID) | ORAL | Status: DC
  Administered 2023-06-26 – 2023-06-27 (×3): 50 mg via ORAL
  Filled 2023-06-26 (×3): qty 1

## 2023-06-26 MED ORDER — POLYETHYLENE GLYCOL 3350 17 G PO PACK: 17.0000 g | PACK | Freq: Every day | ORAL | Status: DC | PRN

## 2023-06-26 MED ORDER — CARVEDILOL 12.5 MG PO TABS
25.0000 mg | ORAL_TABLET | Freq: Once | ORAL | Status: AC
  Administered 2023-06-26: 25 mg via ORAL
  Filled 2023-06-26: qty 2

## 2023-06-26 MED ORDER — LOSARTAN POTASSIUM 50 MG PO TABS
100.0000 mg | ORAL_TABLET | Freq: Every day | ORAL | Status: DC
  Administered 2023-06-26 – 2023-06-27 (×2): 100 mg via ORAL
  Filled 2023-06-26 (×2): qty 2

## 2023-06-26 MED ORDER — DEXAMETHASONE SODIUM PHOSPHATE 10 MG/ML IJ SOLN
6.0000 mg | Freq: Every day | INTRAMUSCULAR | Status: DC
  Filled 2023-06-26: qty 0.6

## 2023-06-26 MED ORDER — ACETAMINOPHEN 325 MG PO TABS
650.0000 mg | ORAL_TABLET | Freq: Three times a day (TID) | ORAL | Status: DC | PRN
  Administered 2023-06-26: 650 mg via ORAL
  Filled 2023-06-26: qty 2

## 2023-06-26 MED ORDER — ATORVASTATIN CALCIUM 40 MG PO TABS
40.0000 mg | ORAL_TABLET | Freq: Every day | ORAL | Status: DC
  Administered 2023-06-27: 40 mg via ORAL
  Filled 2023-06-26: qty 1

## 2023-06-26 MED ORDER — HEPARIN SODIUM (PORCINE) 5000 UNIT/ML IJ SOLN
5000.0000 [IU] | Freq: Three times a day (TID) | INTRAMUSCULAR | Status: DC
  Administered 2023-06-26 – 2023-06-27 (×2): 5000 [IU] via SUBCUTANEOUS
  Filled 2023-06-26 (×2): qty 1

## 2023-06-26 MED ORDER — DEXAMETHASONE SODIUM PHOSPHATE 10 MG/ML IJ SOLN: 8.0000 mg | Freq: Every day | INTRAMUSCULAR | Status: DC

## 2023-06-26 MED ORDER — ADULT MULTIVITAMIN W/MINERALS CH: 1.0000 | ORAL_TABLET | ORAL | Status: DC

## 2023-06-26 MED ORDER — FUROSEMIDE 40 MG PO TABS: 40.0000 mg | ORAL_TABLET | Freq: Every morning | ORAL | Status: DC

## 2023-06-26 MED ORDER — LABETALOL HCL 5 MG/ML IV SOLN: 20.0000 mg | INTRAVENOUS | Status: DC | PRN

## 2023-06-26 MED ORDER — SPIRONOLACTONE 25 MG PO TABS
25.0000 mg | ORAL_TABLET | Freq: Every day | ORAL | Status: DC
  Administered 2023-06-26 – 2023-06-27 (×2): 25 mg via ORAL
  Filled 2023-06-26: qty 2
  Filled 2023-06-26: qty 1

## 2023-06-26 MED ORDER — VANCOMYCIN HCL 500 MG/100ML IV SOLN
500.0000 mg | INTRAVENOUS | Status: DC
  Filled 2023-06-26: qty 100

## 2023-06-26 MED ORDER — CHLORHEXIDINE GLUCONATE CLOTH 2 % EX PADS
6.0000 | MEDICATED_PAD | Freq: Every day | CUTANEOUS | Status: DC
  Administered 2023-06-26 – 2023-06-27 (×2): 6 via TOPICAL

## 2023-06-26 MED ORDER — VANCOMYCIN HCL 1500 MG/300ML IV SOLN
1500.0000 mg | Freq: Once | INTRAVENOUS | Status: AC
  Administered 2023-06-26: 1500 mg via INTRAVENOUS
  Filled 2023-06-26: qty 300

## 2023-06-26 MED ORDER — DOCUSATE SODIUM 100 MG PO CAPS: 100.0000 mg | ORAL_CAPSULE | Freq: Two times a day (BID) | ORAL | Status: DC | PRN

## 2023-06-26 NOTE — ED Notes (Signed)
EDP x2 at BS.  

## 2023-06-26 NOTE — ED Notes (Signed)
Not in room, pt in CT

## 2023-06-26 NOTE — Progress Notes (Signed)
 Pharmacy Antibiotic Note  Michelle Abbott is a 78 y.o. female admitted on 06/26/2023 presenting with sore throat, concern for epiglottitis  Pharmacy has been consulted for vancomycin  and ceftriaxone  dosing.  Vancomycin  1500 mg IV x 1 given in ED, CKD-V SCr about baseline  Plan: Vancomycin  500 mg IV q 36h (eAUC 506) Ceftriaxone  2g IV q 24h Monitor renal function, Cx and clinical progression to narrow Vancomycin  levels as indicated  Height: 5' 3 (160 cm) Weight: 75.8 kg (167 lb) IBW/kg (Calculated) : 52.4  Temp (24hrs), Avg:98.2 F (36.8 C), Min:98 F (36.7 C), Max:98.3 F (36.8 C)  Recent Labs  Lab 06/26/23 1500  WBC 6.4  CREATININE 4.36*    Estimated Creatinine Clearance: 10.9 mL/min (A) (by C-G formula based on SCr of 4.36 mg/dL (H)).    Allergies  Allergen Reactions   Ciprofloxacin  Hcl Other (See Comments)    Burning at IV site   Metformin And Related Other (See Comments)    CKD    Ace Inhibitors Cough    Lisinopril   Amlodipine  Swelling    Lower leg swelling.    Baclofen  Nausea Only and Other (See Comments)    Disoriented, dizziness, weakness, fatigue.    Feraheme  [Ferumoxytol ] Other (See Comments)    Chest pain    Lisinopril Palpitations    Dorn Poot, PharmD, [facility] Clinical Pharmacist ED Pharmacist Phone # [phone] 06/26/2023 6:37 PM

## 2023-06-26 NOTE — ED Provider Notes (Signed)
 [place] EMERGENCY DEPARTMENT AT [facility] Provider Note  MDM   HPI/ROS:  Michelle Abbott is a 78 y.o. female with pertinent past medical history of hypertension, type II DM, CKD stage V who presents for evaluation of a odynophagia.  Patient reports she initially developed some mild irritation in her throat about 2 weeks ago and has had some intermittent throat swelling and tongue swelling since this time.  She denies any associated fevers, chills, dyspnea, chest pain, rash though does note some difficulty swallowing her secretions due to pain.  She is currently receiving treatment for a UTI with Augmentin , otherwise no new medications.  Physical exam is notable for: -no stridor, tolerating secretions -- Bilateral submandibular lymphadenopathy L>R that is tender to palpation -- Mild fullness of anterior neck but no discrete masses -- No oropharyngeal swelling or erythema, exudates  On my initial evaluation, patient is:  -Hypertensive but otherwise vital signs within normal limits. Patient afebrile, hemodynamically stable, and non-toxic appearing. -In no respiratory distress, airway patent -Labs reviewed: WBC 6.4, hemoglobin 8.2, creatinine 4.36, BUN 45, CO2 20 AG 11 -Group A strep and COVID/flu/RSV negative  Differential diagnosis includes pharyngitis, RPA, lymphadenitis, malignancy, epiglottitis.  Exam is not consistent with Ludwig's angina, PTA, anaphylaxis, angioedema.  Labs overall reassuring as above with stable anemia and elevated creatinine when compared with prior.  CT pending at this time.   Interpretations, interventions, and the patient's course of care are documented below.   -CT neck demonstrating findings concerning for possible acute pharyngitis/epiglottitis as well as sialodenitis -Clinically not as consistent with epiglottitis given subacute nature of symptom onset without fever, leukocytosis, respiratory distress, however given these findings ENT was  consulted and spoke with Dr. Roark.  He is recommending admission to ICU for close airway monitoring and will perform formal laryngoscopy sometime this afternoon/evening -Dexamethasone , empiric antibiotics, blood cultures ordered -Discussed patient's case with Jeralyn Banner, NP with the ICU team who will admit patient.  Please see their note for further treatment plan details.  Patient remained stable and had no acute events while in my care in the emergency department.  Disposition:  I discussed the case with ICU team who graciously agreed to admit the patient to their service for continued care.   Clinical Impression:  1. Epiglottitis     Rx / DC Orders ED Discharge Orders     None       The plan for this patient was discussed with Dr. Patt, who voiced agreement and who oversaw evaluation and treatment of this patient.   Clinical Complexity A medically appropriate history, review of systems, and physical exam was performed.  My independent interpretations of EKG, labs, and radiology are documented in the ED course above.   If decision rules were used in this patient's evaluation, they are listed below.   Patient's presentation is most consistent with acute presentation with potential threat to life or bodily function.  Medical Decision Making Amount and/or Complexity of Data Reviewed Labs: ordered.  Risk Prescription drug management. Decision regarding hospitalization.    HPI/ROS      See MDM section for pertinent HPI and ROS. A complete ROS was performed with pertinent positives/negatives noted above.   Past Medical History:  Diagnosis Date   Acute kidney injury superimposed on chronic kidney disease (HCC) 04/28/2020   Anemia    Arthritis    Chronic kidney disease    Stage 4   Diabetes mellitus without complication (HCC)    type 2  GERD (gastroesophageal reflux disease)    Gout 2017   Hyperlipidemia    Hypertension    Hypertensive nephropathy 05/09/2021    Nephrotic range proteinuria 05/09/2021   Postoperative pain 04/29/2020   Rectal bleeding, History of 12/28/2019   Sciatica    Secondary hyperparathyroidism of renal origin (HCC) 05/09/2021   SI (sacroiliac) joint inflammation (HCC) 03/12/2019   UTI (urinary tract infection) 02/02/2019   Vitamin D  deficiency 2014    Past Surgical History:  Procedure Laterality Date   AV FISTULA PLACEMENT Left 12/08/2022   Procedure: CREATION OF LEFT ARM  BRACHIO BASILIC FISTULA   (FIRST STAGE);  Surgeon: Lanis Fonda BRAVO, MD;  Location: [facility] OR;  Service: Vascular;  Laterality: Left;   BASCILIC VEIN TRANSPOSITION Left 02/02/2023   Procedure: LEFT ARM SECOND STAGE BASILIC VEIN TRANSPOSITION;  Surgeon: Lanis Fonda BRAVO, MD;  Location: [facility] OR;  Service: Vascular;  Laterality: Left;   BREAST SURGERY Right 1961   Benign lump removed   CATARACT EXTRACTION, BILATERAL Bilateral 2017   CESAREAN SECTION  1985   CESAREAN SECTION  1990   CHOLECYSTECTOMY  1999   COLONOSCOPY     DILATION AND CURETTAGE OF UTERUS  2017   KNEE ARTHROSCOPY Right    VAGINAL SEPTOPLASTY  1966      Physical Exam   Vitals:   06/26/23 1444 06/26/23 1450  BP: (!) 197/76   Pulse: 71   Resp: 17   Temp: 98 F (36.7 C)   TempSrc: Oral   SpO2: 99%   Weight:  75.8 kg  Height:  5' 3 (1.6 m)    Physical Exam Gen: NAD. Appears comfortable.  Tolerating secretions.  Mildly raspy voice. HENT: Conjunctiva clear, PERRL, EOMI. MMM.  No stridor.  Mild fullness of anterior neck without discrete masses.  Bilateral submandibular lymphadenopathy that is tender to palpation.  No oropharyngeal erythema or swelling.  Normal texture of the floor of the mouth. CV: RRR. No M/R/G Pulm: Lungs CTAB with no wheezing, rales, or rhonchi.  GI: Abdomen soft, non-tender, non-distended. Normal bowel sounds in all 4 quadrants. MSK/Skin: No lower extremity edema. Extremities warm, well-perfused with 2+ pulses in all 4 extremities. Neuro: A&Ox3. GCS 15. Moves all  extremities.    Procedures   If procedures were preformed on this patient, they are listed below:  Procedures   Laverda Rimes, MD Emergency Medicine PGY-2   Please note that this documentation was produced with the assistance of voice-to-text technology and may contain errors.    Rimes Laverda, MD 06/27/23 1259    Patt Alm Macho, MD 06/29/23 [phone]

## 2023-06-26 NOTE — Consult Note (Signed)
 Reason for Consult:epiglottis Referring Physician: Dr Patt  Michelle Abbott is an 78 y.o. female.  HPI: hx of 2 weeks of sore throat. She has not had this previously. She has had worsening of the throat this week. Had some difficulty swallowing. She had ear pain. She is on antibiotics for UTI. No stridor or difficulty breathing.   Past Medical History:  Diagnosis Date   Acute kidney injury superimposed on chronic kidney disease (HCC) 04/28/2020   Anemia    Arthritis    Chronic kidney disease    Stage 4   Diabetes mellitus without complication (HCC)    type 2   GERD (gastroesophageal reflux disease)    Gout 2017   Hyperlipidemia    Hypertension    Hypertensive nephropathy 05/09/2021   Nephrotic range proteinuria 05/09/2021   Postoperative pain 04/29/2020   Rectal bleeding, History of 12/28/2019   Sciatica    Secondary hyperparathyroidism of renal origin (HCC) 05/09/2021   SI (sacroiliac) joint inflammation (HCC) 03/12/2019   UTI (urinary tract infection) 02/02/2019   Vitamin D  deficiency 2014    Past Surgical History:  Procedure Laterality Date   AV FISTULA PLACEMENT Left 12/08/2022   Procedure: CREATION OF LEFT ARM  BRACHIO BASILIC FISTULA   (FIRST STAGE);  Surgeon: Lanis Fonda BRAVO, MD;  Location: [facility] OR;  Service: Vascular;  Laterality: Left;   BASCILIC VEIN TRANSPOSITION Left 02/02/2023   Procedure: LEFT ARM SECOND STAGE BASILIC VEIN TRANSPOSITION;  Surgeon: Lanis Fonda BRAVO, MD;  Location: [facility] OR;  Service: Vascular;  Laterality: Left;   BREAST SURGERY Right 1961   Benign lump removed   CATARACT EXTRACTION, BILATERAL Bilateral 2017   CESAREAN SECTION  1985   CESAREAN SECTION  1990   CHOLECYSTECTOMY  1999   COLONOSCOPY     DILATION AND CURETTAGE OF UTERUS  2017   KNEE ARTHROSCOPY Right    VAGINAL SEPTOPLASTY  1966    Family History  Problem Relation Age of Onset   Cancer Mother        ovarian and pancreatic   Diabetes Mother    Stroke Mother    COPD Father     Arthritis Father    Cancer Brother        Lung   COPD Brother     Social History:  reports that she quit smoking about 26 years ago. Her smoking use included cigarettes. She started smoking about 56 years ago. She has a 45 pack-year smoking history. She has been exposed to tobacco smoke. She has never used smokeless tobacco. She reports that she does not drink alcohol and does not use drugs.  Allergies:  Allergies  Allergen Reactions   Ciprofloxacin  Hcl Other (See Comments)    Burning at IV site   Metformin And Related Other (See Comments)    CKD    Ace Inhibitors Cough    Lisinopril   Amlodipine  Swelling    Lower leg swelling.    Baclofen  Nausea Only and Other (See Comments)    Disoriented, dizziness, weakness, fatigue.    Feraheme  [Ferumoxytol ] Other (See Comments)    Chest pain    Lisinopril Palpitations    Medications: I have reviewed the patient's current medications.  Results for orders placed or performed during the hospital encounter of 06/26/23 (from the past 48 hours)  Basic metabolic panel     Status: Abnormal   Collection Time: 06/26/23  3:00 PM  Result Value Ref Range   Sodium 134 (L) 135 - 145 mmol/L  Potassium 3.8 3.5 - 5.1 mmol/L   Chloride 103 98 - 111 mmol/L   CO2 20 (L) 22 - 32 mmol/L   Glucose, Bld 101 (H) 70 - 99 mg/dL    Comment: Glucose reference range applies only to samples taken after fasting for at least 8 hours.   BUN 45 (H) 8 - 23 mg/dL   Creatinine, Ser 5.63 (H) 0.44 - 1.00 mg/dL   Calcium  8.9 8.9 - 10.3 mg/dL   GFR, Estimated 10 (L) >60 mL/min    Comment: (NOTE) Calculated using the CKD-EPI Creatinine Equation (2021)    Anion gap 11 5 - 15    Comment: Performed at [facility], [address] [place], [place] [address]  CBC with Differential     Status: Abnormal   Collection Time: 06/26/23  3:00 PM  Result Value Ref Range   WBC 6.4 4.0 - 10.5 K/uL   RBC 3.14 (L) 3.87 - 5.11 MIL/uL   Hemoglobin 8.2 (L) 12.0 - 15.0 g/dL   HCT  71.7 (L) 63.9 - 46.0 %   MCV 89.8 80.0 - 100.0 fL   MCH 26.1 26.0 - 34.0 pg   MCHC 29.1 (L) 30.0 - 36.0 g/dL   RDW 84.8 88.4 - 84.4 %   Platelets 209 150 - 400 K/uL   nRBC 0.0 0.0 - 0.2 %   Neutrophils Relative % 67 %   Neutro Abs 4.4 1.7 - 7.7 K/uL   Lymphocytes Relative 18 %   Lymphs Abs 1.1 0.7 - 4.0 K/uL   Monocytes Relative 11 %   Monocytes Absolute 0.7 0.1 - 1.0 K/uL   Eosinophils Relative 3 %   Eosinophils Absolute 0.2 0.0 - 0.5 K/uL   Basophils Relative 1 %   Basophils Absolute 0.0 0.0 - 0.1 K/uL   Immature Granulocytes 0 %   Abs Immature Granulocytes 0.01 0.00 - 0.07 K/uL    Comment: Performed at [facility], [address] [place], [place] [address]  Group A Strep by PCR     Status: None   Collection Time: 06/26/23  3:00 PM   Specimen: Throat; Sterile Swab  Result Value Ref Range   Group A Strep by PCR NOT DETECTED NOT DETECTED    Comment: Performed at [facility], [address], [place], [place] [address]  Resp panel by RT-PCR (RSV, Flu A&B, Covid) Throat     Status: None   Collection Time: 06/26/23  3:00 PM   Specimen: Throat; Nasal Swab  Result Value Ref Range   SARS Coronavirus 2 by RT PCR NEGATIVE NEGATIVE   Influenza A by PCR NEGATIVE NEGATIVE   Influenza B by PCR NEGATIVE NEGATIVE    Comment: (NOTE) The Xpert Xpress SARS-CoV-2/FLU/RSV plus assay is intended as an aid in the diagnosis of influenza from Nasopharyngeal swab specimens and should not be used as a sole basis for treatment. Nasal washings and aspirates are unacceptable for Xpert Xpress SARS-CoV-2/FLU/RSV testing.  Fact Sheet for Patients: bloggercourse.com  Fact Sheet for Healthcare Providers: seriousbroker.it  This test is not yet approved or cleared by the [place]  FDA and has been authorized for detection and/or diagnosis of SARS-CoV-2 by FDA under an Emergency Use Authorization (EUA). This EUA will remain in effect  (meaning this test can be used) for the duration of the COVID-19 declaration under Section 564(b)(1) of the Act, 21 U.S.C. section 360bbb-3(b)(1), unless the authorization is terminated or revoked.     Resp Syncytial Virus by PCR  NEGATIVE NEGATIVE    Comment: (NOTE) Fact Sheet for Patients: bloggercourse.com  Fact Sheet for Healthcare Providers: seriousbroker.it  This test is not yet approved or cleared by the [place]  FDA and has been authorized for detection and/or diagnosis of SARS-CoV-2 by FDA under an Emergency Use Authorization (EUA). This EUA will remain in effect (meaning this test can be used) for the duration of the COVID-19 declaration under Section 564(b)(1) of the Act, 21 U.S.C. section 360bbb-3(b)(1), unless the authorization is terminated or revoked.  Performed at [facility], [address], [place], [place] [address]     CT Soft Tissue Neck Wo Contrast Result Date: 06/26/2023 CLINICAL DATA:  Submandibular swelling.  Pain with swallowing. EXAM: CT NECK WITHOUT CONTRAST TECHNIQUE: Multidetector CT imaging of the neck was performed following the standard protocol without intravenous contrast. RADIATION DOSE REDUCTION: This exam was performed according to the departmental dose-optimization program which includes automated exposure control, adjustment of the mA and/or kV according to patient size and/or use of iterative reconstruction technique. COMPARISON:  None Available. FINDINGS: Pharynx and larynx: Mild soft tissue prominence is present at the tongue base and vallecula. The epiglottis is mildly thickened. No discrete lesion is present. Distinction is somewhat limited without IV contrast. The nasopharynx is clear. Soft palate is unremarkable. Aryepiglottic folds and piriform sinuses are clear. Vocal cords are midline and symmetric. Trachea is clear. Salivary glands: Mild prominence of the submandibular glands is  noted bilaterally. There is some stranding about both glands. No duct obstruction is present. The parotid glands are within normal limits bilaterally. Thyroid : Normal Lymph nodes: No significant cervical adenopathy is present. Vascular: Atherosclerotic calcifications are present within the carotid bifurcations bilaterally without focal stenosis. Limited intracranial: Within normal limits. Visualized orbits: Bilateral lens replacements are noted. Globes and orbits are otherwise unremarkable. Mastoids and visualized paranasal sinuses: The paranasal sinuses and mastoid air cells are clear. Skeleton: The vertebral body heights alignment are normal. Anterior osteophytes are fused in the upper thoracic spine. The patient is edentulous. No focal osseous lesions are present. Upper chest: The lung apices are clear. The thoracic inlet is within normal limits. IMPRESSION: 1. Mild soft tissue prominence at the tongue base and vallecula with thickening of the epiglottis is concerning for acute pharyngitis/epiglottitis. 2. Mild prominence of the submandibular glands bilaterally with some stranding about both glands. This is suggestive of sialoadenitis. No duct obstruction is present. 3. No significant cervical adenopathy. 4. Atherosclerotic calcifications within the carotid bifurcations bilaterally without focal stenosis. These results were called by telephone at the time of interpretation on 06/26/2023 at 4:48 pm to provider KAYLA KEITH , who verbally acknowledged these results. Electronically Signed   By: Lonni Necessary M.D.   On: 06/26/2023 16:51    ROS Blood pressure (!) 201/82, pulse 75, temperature 98.3 F (36.8 C), temperature source Oral, resp. rate 18, height 5' 3 (1.6 m), weight 75.8 kg, SpO2 100%. Physical Exam Constitutional:      Appearance: She is well-developed.  HENT:     Head: Normocephalic.     Right Ear: Tympanic membrane normal.     Left Ear: Tympanic membrane normal.     Mouth/Throat:      Comments: No swelling of the tongue or pharynx. FOM normal. FOE- the epiglottis does have a watery edema look but no erythema or purulence. The vallecula and  base of tongue also has the same appearance. The glottis is easily visualized and the rest of larynx looks normal. TVC move well. No  stridor. There is some secretions pooling Eyes:     Conjunctiva/sclera: Conjunctivae normal.     Pupils: Pupils are equal, round, and reactive to light.  Neck:     Comments: Tender in the anterior submental area. No firmness or erythema. The neck is soft Neurological:     Mental Status: She is alert.       Assessment/Plan: Supraglottis- she has a watery edema that has no beefy red appearance or exudative process. The process is located mostly in the vallecula. The airway is open. She needs ICU admission and IV antibiotics and steroids. She should resolve with medical therapy. No intervention needed at this time.   Norleen Notice 06/26/2023, 6:21 PM

## 2023-06-26 NOTE — ED Notes (Signed)
 ENT at [facility]

## 2023-06-26 NOTE — ED Provider Triage Note (Signed)
 Emergency Medicine Provider Triage Evaluation Note  Michelle Abbott , a 78 y.o. female  was evaluated in triage.  Pt complains of Throat pain/swelling.  Review of Systems  Positive: Cough, congestion, sore throat, difficulty eating/drinking Negative: Fever, chills, SHOB, CP  Physical Exam  BP (!) 197/76 (BP Location: Right Arm)   Pulse 71   Temp 98 F (36.7 C) (Oral)   Resp 17   Ht 5' 3 (1.6 m)   Wt 75.8 kg   SpO2 99%   BMI 29.58 kg/m  Gen:   Awake, no distress   Resp:  Normal effort  MSK:   Moves extremities without difficulty  Other:    Medical Decision Making  Medically screening exam initiated at 3:01 PM.  Appropriate orders placed.  Michelle Abbott was informed that the remainder of the evaluation will be completed by another provider, this initial triage assessment does not replace that evaluation, and the importance of remaining in the ED until their evaluation is complete.  Labs and imaging ordered   Michelle Abbott 06/26/23 [id number]

## 2023-06-26 NOTE — ED Triage Notes (Signed)
 Pt coming in complaining of swelling in her throat. Pt is maintaining oxygen of 100% on ra. Pt managing secretions but complains of pain with swallowing her spit. Pt alert and oriented.

## 2023-06-26 NOTE — H&P (Addendum)
 NAME:  Lagretta Loseke, MRN:  [id number], DOB:  11/10/1945, LOS: 0 ADMISSION DATE:  06/26/2023, CONSULTATION DATE: 2/8 REFERRING MD: patt, CHIEF COMPLAINT: Acute epiglottitis  History of Present Illness:  78 year old female patient with known history of hypertension, and CKD stage V was in her usual state of health up until about 1/30 when he began to notice some degree of sore throat and nonproductive cough, she also had some mild swelling and pain in the right side of her neck she treated this at home with gargling salt water she was  actually seen on 2/3 with chief complaint of vaginal discharge and dysuria, has a history of urinary incontinence and worsening burning on urination diagnosed with a urinary tract infection so she was treated with oral Augmentin .  Since that time the urinary symptoms have again subsided however her sore throat has continued and has been ongoing.  Around 2/5 she noted increased swelling particularly over the right neck and up into the right ear, the pain persisted, the swelling seemed to subside as of 2/able to pain continued.  She also noted vocal hoarseness, ongoing painful swallowing, and increased rattling breath sounds when in the more's supine position.  She has been able to swallow, and has not been drooling. Because of  ongoing discomfort she presented to the emergency room for evaluation on 2/8.  She was seen by ER staff, room air saturations 100% did have some painful adenopathy of the right anterior cervical lymph chain a CT of the neck was obtained this showed mild soft tissue prominence at the tongue and base of the vallecula the epiglottis was mildly thickened there were no discrete lesions or fluid collections the vocal cords were midline and symmetric the submandibular glands were prominent bilaterally with some stranding in both glands with findings concerning for acute pharyngitis/epiglottitis she was started on IV steroids, antibiotics, and ENT was consulted  recommending ICU admission  ER eval  RVP negative, group A strep,  Pertinent  Medical History  Hypertension, CKD stage 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 rate 18, height 5' 3 (1.6 m), weight 75.8 kg, SpO2 100%.       No intake or output data in the 24 hours ending 06/26/23 1806 Filed Weights   06/26/23 1450  Weight: 75.8 kg    Examination: General: 78 year old female patient resting in bed no acute distress currently HENT: Phonation quality hoarse, no drooling, able to swallow, some painful swollen right anterior cervical lymph nodes Lungs: Clear to auscultation Cardiovascular: Regular rate and rhythm Abdomen: Soft not tender Extremities: Warm dry Neuro: Awake oriented GU: Chronic incontinence  Resolved Hospital Problem list    Assessment & Plan:  Acute epiglottitis Plan Admit to the intensive care for close observation Humidified face tent Will have bougie, scalpel, and size 6 tracheostomy at bedside in the event emergent surgical airway needed ENT has been consulted, evaluation pending with plan for upper airway evaluation  IV ceftriaxone  and vanc  Scheduled steroids Continuous pulse oximetry  Poorly controlled hypertension Plan Admitting to the intensive care Typically on Lasix ,  Coreg , and losartan  in addition to spironolactone  Can continue oral medications as long as she can swallow We will add as needed labetalol   CKD stage V with baseline serum creatinine in the mid 4 range Plan BP  control And renal adjust medications A.m. chemistry   Type II DM  Plan Ssi   Anemia of chronic disease Plan Trend  Transfuse for hgb < 7 or active bleeding  Best Practice (right click and Reselect all SmartList Selections daily)   Diet/type: clear liquids DVT prophylaxis prophylactic heparin   Pressure ulcer(s): N/A GI prophylaxis: N/A Lines: N/A Foley:  N/A Code Status:  full code Last date of multidisciplinary goals of care discussion [NA]  Labs   CBC: Recent Labs  Lab 06/26/23 1500  WBC 6.4  NEUTROABS 4.4  HGB 8.2*  HCT 28.2*  MCV 89.8  PLT 209    Basic Metabolic Panel: Recent Labs  Lab 06/26/23 1500  NA 134*  K 3.8  CL 103  CO2 20*  GLUCOSE 101*  BUN 45*  CREATININE 4.36*  CALCIUM  8.9   GFR: Estimated Creatinine Clearance: 10.9 mL/min (A) (by C-G formula based on SCr of 4.36 mg/dL (H)). Recent Labs  Lab 06/26/23 1500  WBC 6.4    Liver Function Tests: No results for input(s): AST, ALT, ALKPHOS, BILITOT, PROT, ALBUMIN in the last 168 hours. No results for input(s): LIPASE, AMYLASE in the last 168 hours. No results for input(s): AMMONIA in the last 168 hours.  ABG    Component Value Date/Time   TCO2 17 (L) 02/02/2023 0727     Coagulation Profile: No results for input(s): INR, PROTIME in the last 168 hours.  Cardiac Enzymes: No results for input(s): CKTOTAL, CKMB, CKMBINDEX, TROPONINI in the last 168 hours.  HbA1C: HbA1c, POC (controlled diabetic range)  Date/Time Value Ref Range Status  06/08/2023 02:00 PM 5.4 0.0 - 7.0 % Final  09/17/2022 02:07 PM 5.9 0.0 - 7.0 % Final    CBG: No results for input(s): GLUCAP in the last 168 hours.  Review of Systems:   Review of Systems  Constitutional:  Positive for malaise/fatigue. Negative for fever.  HENT:  Positive for sore throat.   Eyes: Negative.   Respiratory:  Positive for shortness of breath.   Cardiovascular: Negative.   Gastrointestinal:  Negative.   Genitourinary: Negative.   Musculoskeletal: Negative.   Skin: Negative.   Neurological: Negative.   Endo/Heme/Allergies: Negative.      Past Medical History:  She,  has a past medical history of Acute kidney injury superimposed on chronic kidney disease (HCC) (04/28/2020), Anemia, Arthritis, Chronic kidney disease, Diabetes mellitus without complication (HCC), GERD (gastroesophageal reflux disease), Gout (2017), Hyperlipidemia, Hypertension, Hypertensive nephropathy (05/09/2021), Nephrotic range proteinuria (05/09/2021), Postoperative pain (04/29/2020), Rectal bleeding, History of (12/28/2019), Sciatica, Secondary hyperparathyroidism of renal origin (HCC) (05/09/2021), SI (sacroiliac) joint inflammation (HCC) (03/12/2019), UTI (urinary tract infection) (02/02/2019), and Vitamin D  deficiency (2014).   Surgical History:   Past Surgical History:  Procedure Laterality Date   AV FISTULA PLACEMENT Left 12/08/2022   Procedure: CREATION OF LEFT ARM  BRACHIO BASILIC FISTULA   (FIRST STAGE);  Surgeon: Lanis Fonda BRAVO, MD;  Location: [facility] OR;  Service: Vascular;  Laterality: Left;   BASCILIC VEIN TRANSPOSITION Left 02/02/2023   Procedure: LEFT ARM SECOND STAGE BASILIC VEIN TRANSPOSITION;  Surgeon: Lanis Fonda BRAVO, MD;  Location: [facility] OR;  Service: Vascular;  Laterality: Left;   BREAST SURGERY Right 1961   Benign lump removed   CATARACT EXTRACTION, BILATERAL Bilateral 2017  CESAREAN SECTION  1985   CESAREAN SECTION  1990   CHOLECYSTECTOMY  1999   COLONOSCOPY     DILATION AND CURETTAGE OF UTERUS  2017   KNEE ARTHROSCOPY Right    VAGINAL SEPTOPLASTY  1966     Social History:   reports that she quit smoking about 26 years ago. Her smoking use included cigarettes. She started smoking about 56 years ago. She has a 45 pack-year smoking history. She has been exposed to tobacco smoke. She has never used smokeless tobacco. She reports that she does not drink alcohol and does not use drugs.    Family History:  Her family history includes Arthritis in her father; COPD in her brother and father; Cancer in her brother and mother; Diabetes in her mother; Stroke in her mother.   Allergies Allergies  Allergen Reactions   Ciprofloxacin  Hcl Other (See Comments)    Burning at IV site   Metformin And Related Other (See Comments)    CKD    Ace Inhibitors Cough    Lisinopril   Amlodipine  Swelling    Lower leg swelling.    Baclofen  Nausea Only and Other (See Comments)    Disoriented, dizziness, weakness, fatigue.    Feraheme  [Ferumoxytol ] Other (See Comments)    Chest pain    Lisinopril Palpitations     Home Medications  Prior to Admission medications   Medication Sig Start Date End Date Taking? Authorizing Provider  ACCU-CHEK FASTCLIX LANCETS MISC TEST BLOOD SUGAR FOUR TIMES DAILY 08/24/17   Riccio, Angela C, DO  acetaminophen  (TYLENOL  8 HOUR) 650 MG CR tablet Take 1 tablet (650 mg total) by mouth every 8 (eight) hours as needed for pain. 05/02/20   Espinoza, Alejandra, DO  allopurinol  (ZYLOPRIM ) 100 MG tablet TAKE ONE TABLET (100MG  TOTAL) BY MOUTH TWICE A WEEK ON WEDNESDAY AND SUNDAY 05/28/23   Quillen, Michael, MD  amoxicillin -clavulanate (AUGMENTIN ) 250-125 MG tablet Take 1 tablet by mouth 2 (two) times daily for 5 days. 06/23/23 06/28/23  Everhart, Kirstie, DO  atorvastatin  (LIPITOR) 40 MG tablet TAKE 1 TABLET BY MOUTH EVERY DAY 05/10/23   Alba Sharper, MD  B-D ULTRAFINE III SHORT PEN 31G X 8 MM MISC Use to give insulin  twice a day and victoza  once a day. Dx code = E11.9 09/24/21   Henriette Mora, MD  Blood Glucose Monitoring Suppl [facility] VERIO) w/Device KIT Use to check blood sugars 3x daily. E11.9 05/21/20   Mahoney, Caitlin, MD  carvedilol  (COREG ) 25 MG tablet Take 25 mg by mouth in the morning.    [provider]  Cholecalciferol  (VITAMIN D ) 125 MCG (5000 UT) CAPS Take 5,000 Units by mouth daily.    [provider]  Cyanocobalamin  (VITAMIN B-12) 2500 MCG  SUBL Place 2,500 mcg under the tongue daily.    [provider]  fluconazole  (DIFLUCAN ) 150 MG tablet Take 1 tablet (150 mg total) by mouth daily. 06/23/23   Everhart, Kirstie, DO  furosemide  (LASIX ) 40 MG tablet Take 40 mg by mouth in the morning.    [provider]  losartan  (COZAAR ) 100 MG tablet Take 1 tablet (100 mg total) by mouth daily. 12/04/22   Quillen, Michael, MD  Multiple Vitamin (MULTIVITAMIN WITH MINERALS) TABS tablet Take 1 tablet by mouth 2 (two) times a week.    [provider]  [facility] ULTRA test strip USE TO TEST BLOOD SUGARS 3X DAILY. E11.9 08/28/21   Mahoney, Caitlin, MD  oxybutynin  (DITROPAN  XL) 15 MG 24 hr tablet Take 15 mg  by mouth every morning. 11/04/22   [provider]  polyethylene glycol powder (MIRALAX ) 17 GM/SCOOP powder Take 17 g by mouth daily as needed (constipation). 06/08/23   Alba Sharper, MD  Semaglutide ,0.25 or 0.5MG /DOS, 2 MG/1.5ML SOPN Inject 0.5 mg into the skin once a week. 06/08/23   Alba Sharper, MD  spironolactone  (ALDACTONE ) 25 MG tablet Take 1 tablet (25 mg total) by mouth daily. Needs Appt prior to next refill. 05/28/23   Alba Sharper, MD  traMADol  (ULTRAM ) 50 MG tablet Take 1 tablet (50 mg total) by mouth every 6 (six) hours as needed. 02/02/23 02/02/24  Elna Ahmed SQUIBB, PA-C     Critical care time: NA

## 2023-06-26 NOTE — ED Notes (Signed)
 Remains calm, NAD, alert, interactive, speech clear, no stridor, LS CTA. PCCM at [facility].

## 2023-06-26 NOTE — Progress Notes (Addendum)
 eLink Physician-Brief Progress Note Patient Name: Michelle Abbott DOB: February 07, 1946 MRN: [id number]   Date of Service  06/26/2023  HPI/Events of Note  2 weeks of throat pain leading up to admission by ENT for suspected acute epiglottitis.  SBP's are in the 200s asking for as needed antihypertensives.    On presentation she is hypertensive otherwise normal vitals.  Results consistent with normocytic anemia, elevated creatinine, grossly positive urinalysis with E. coli.  CT of the neck consistent with pharyngitis/pancreatitis.  Vallecula and tongue base swelling.  eICU Interventions  Home meds already ordered.  Will add as needed hydralazine   Maintain broad-spectrum antibiotics with vancomycin  and ceftriaxone .  Maintain other home meds including diuretics and directed medical therapy.  Antihypertensives on board.  Scheduled dexamethasone .  Sliding scale insulin   DVT prophylaxis with heparin  subcutaneous GI prophylaxis not indicated   2154 -refractory hypertension.  Hydralazine  10 mg with minimal effect.  Will add scheduled hydralazine .  Allergy/contraindication to ACE inhibitor's and amlodipine .  Increase IV as needed hydralazine  dose  Intervention Category Intermediate Interventions: Hypertension - evaluation and management Evaluation Type: New Patient Evaluation  Michelle Abbott 06/26/2023, 8:44 PM

## 2023-06-27 DIAGNOSIS — J0510 Acute epiglottitis without obstruction: Secondary | ICD-10-CM | POA: Diagnosis not present

## 2023-06-27 LAB — GLUCOSE, CAPILLARY
Glucose-Capillary: 122 mg/dL — ABNORMAL HIGH (ref 70–99)
Glucose-Capillary: 124 mg/dL — ABNORMAL HIGH (ref 70–99)

## 2023-06-27 MED ORDER — AMOXICILLIN-POT CLAVULANATE 500-125 MG PO TABS: 500.0000 mg | ORAL_TABLET | Freq: Two times a day (BID) | ORAL | 0 refills | Status: AC

## 2023-06-27 MED ORDER — DEXAMETHASONE 6 MG PO TABS: 6.0000 mg | ORAL_TABLET | Freq: Every day | ORAL | 0 refills | Status: AC

## 2023-06-27 MED ORDER — FUROSEMIDE 40 MG PO TABS
40.0000 mg | ORAL_TABLET | Freq: Every morning | ORAL | Status: DC
Start: 2023-06-27 — End: 2023-06-27
  Administered 2023-06-27: 40 mg via ORAL
  Filled 2023-06-27: qty 1

## 2023-06-27 MED ORDER — DEXAMETHASONE SODIUM PHOSPHATE 10 MG/ML IJ SOLN
6.0000 mg | Freq: Every day | INTRAMUSCULAR | Status: DC
Start: 2023-06-27 — End: 2023-06-27
  Administered 2023-06-27: 6 mg via INTRAVENOUS
  Filled 2023-06-27 (×2): qty 0.6

## 2023-06-27 NOTE — Plan of Care (Signed)

## 2023-06-27 NOTE — Progress Notes (Signed)
 Patient escorted via wheelchair to personal car at this time.  No s/s of distress noted all belongings taken with patient.  AVS instructed on and all questions answered.

## 2023-06-27 NOTE — Care Management CC44 (Signed)
 Condition Code 44 Documentation Completed  Patient Details  Name: Drena Ham MRN: [id number] Date of Birth: 09-06-1945   Condition Code 44 given:  Yes Patient signature on Condition Code 44 notice:  Yes Documentation of 2 MD's agreement:  Yes Code 44 added to claim:  Yes    Marval Gell, RN 06/27/2023, 1:42 PM

## 2023-06-27 NOTE — Discharge Summary (Signed)
 Physician Discharge Summary         Patient ID: Michelle Abbott MRN: [id number] DOB/AGE: 76-25-1947 77 y.o.  Admit date: 06/26/2023 Discharge date: 06/27/2023  Discharge Diagnoses:    Active Hospital Problems   Diagnosis Date Noted   Epiglottitis 06/26/2023   Anemia secondary to renal failure 04/29/2020   Chronic kidney disease, stage 5 (HCC) 07/21/2016   Type 2 diabetes mellitus with diabetic neuropathy, with long-term current use of insulin  (HCC) 06/15/2016   Gout 06/15/2016   Hypertension associated with diabetes (HCC) 06/15/2016   Osteoarthritis 06/15/2016   Hyperlipidemia associated with type 2 diabetes mellitus (HCC) 06/15/2016    Resolved Hospital Problems  No resolved problems to display.      Discharge summary    78 year old female patient with known history of hypertension, and CKD stage V was in her usual state of health up until about 1/30 when he began to notice some degree of sore throat and nonproductive cough, she also had some mild swelling and pain in the right side of her neck she treated this at home with gargling salt water she was  actually seen on 2/3 with chief complaint of vaginal discharge and dysuria, has a history of urinary incontinence and worsening burning on urination diagnosed with a urinary tract infection so she was treated with oral Augmentin .  Since that time the urinary symptoms have again subsided however her sore throat has continued and has been ongoing.  Around 2/5 she noted increased swelling particularly over the right neck and up into the right ear, the pain persisted, the swelling seemed to subside as of 2/able to pain continued.  She also noted vocal hoarseness, ongoing painful swallowing, and increased rattling breath sounds when in the more's supine position.  She has been able to swallow, and has not been drooling. Because of  ongoing discomfort she presented to the emergency room for evaluation on 2/8.  She was seen by ER staff, room air  saturations 100% did have some painful adenopathy of the right anterior cervical lymph chain a CT of the neck was obtained this showed mild soft tissue prominence at the tongue and base of the vallecula the epiglottis was mildly thickened there were no discrete lesions or fluid collections the vocal cords were midline and symmetric the submandibular glands were prominent bilaterally with some stranding in both glands with findings concerning for acute pharyngitis/epiglottitis she was started on IV steroids, antibiotics, and ENT was consulted recommending ICU admission  Hospital course: Admitted to the intensive care. Respiratory viral panel and group A strep were negative, started on IV vancomycin  and ceftriaxone .  In addition to Decadron .  Seen by ENT that evening.  Felt primarily supraglottitis with some watery edema on evaluation but no exudate or venous.  Swelling was mostly at the level of the vallecula. She was monitored overnight.  On 12/9 she reported no more pain was able to swallow voice had returned almost to normal was felt she could safely be discharged to home by ENT  Discharge Plan by Active Problems    Acute epiglottitis Improving symptoms with no evidence of airway compromise Plan Monitor Augmentin  500-1 25 1  p.o. twice daily for 6 days Decadron  6 mg daily for 4 more days Follow-up as needed   Poorly controlled hypertension Plan Typically on Lasix , Coreg , and losartan  in addition to spironolactone  Can continue oral medications as long as she can swallow PRN labetelol   CKD stage V with baseline serum creatinine in the mid 4 range  Plan Resume her home antihypertensive regimen              Type II DM  Plan Resume home home regimen   Anemia of chronic disease Plan Him home regimen   Significant Hospital tests/ studies   Procedures    Culture data/antimicrobials    MRSA PCR was negative respiratory viral panel negative group A strep PCR negative Consults    Ears nose throat  Discharge Exam: BP (!) 174/76   Pulse 76   Temp 97.8 F (36.6 C) (Oral)   Resp 14   Ht 5' 3 (1.6 m)   Wt 76.1 kg   SpO2 98%   BMI 29.72 kg/m   General Sitting up in bed no acute distress able to swallow today HEENT normocephalic atraumatic voice less hoarse decrease pain to palpation of neck no drooling no swelling Pulmonary clear to auscultation Cardiac regular rate and rhythm Abdomen soft nontender Extremities are warm and dry Labs at discharge   Lab Results  Component Value Date   CREATININE 4.32 (H) 06/26/2023   BUN 45 (H) 06/26/2023   NA 134 (L) 06/26/2023   K 3.8 06/26/2023   CL 103 06/26/2023   CO2 20 (L) 06/26/2023   Lab Results  Component Value Date   WBC 6.1 06/26/2023   HGB 9.0 (L) 06/26/2023   HCT 30.7 (L) 06/26/2023   MCV 88.7 06/26/2023   PLT 229 06/26/2023   Lab Results  Component Value Date   ALT 12 06/04/2022   AST 14 (L) 06/04/2022   ALKPHOS 67 06/04/2022   BILITOT 0.5 06/04/2022   Lab Results  Component Value Date   INR 1.1 06/04/2022    Current radiological studies    CT Soft Tissue Neck Wo Contrast Result Date: 06/26/2023 CLINICAL DATA:  Submandibular swelling.  Pain with swallowing. EXAM: CT NECK WITHOUT CONTRAST TECHNIQUE: Multidetector CT imaging of the neck was performed following the standard protocol without intravenous contrast. RADIATION DOSE REDUCTION: This exam was performed according to the departmental dose-optimization program which includes automated exposure control, adjustment of the mA and/or kV according to patient size and/or use of iterative reconstruction technique. COMPARISON:  None Available. FINDINGS: Pharynx and larynx: Mild soft tissue prominence is present at the tongue base and vallecula. The epiglottis is mildly thickened. No discrete lesion is present. Distinction is somewhat limited without IV contrast. The nasopharynx is clear. Soft palate is unremarkable. Aryepiglottic folds and piriform  sinuses are clear. Vocal cords are midline and symmetric. Trachea is clear. Salivary glands: Mild prominence of the submandibular glands is noted bilaterally. There is some stranding about both glands. No duct obstruction is present. The parotid glands are within normal limits bilaterally. Thyroid : Normal Lymph nodes: No significant cervical adenopathy is present. Vascular: Atherosclerotic calcifications are present within the carotid bifurcations bilaterally without focal stenosis. Limited intracranial: Within normal limits. Visualized orbits: Bilateral lens replacements are noted. Globes and orbits are otherwise unremarkable. Mastoids and visualized paranasal sinuses: The paranasal sinuses and mastoid air cells are clear. Skeleton: The vertebral body heights alignment are normal. Anterior osteophytes are fused in the upper thoracic spine. The patient is edentulous. No focal osseous lesions are present. Upper chest: The lung apices are clear. The thoracic inlet is within normal limits. IMPRESSION: 1. Mild soft tissue prominence at the tongue base and vallecula with thickening of the epiglottis is concerning for acute pharyngitis/epiglottitis. 2. Mild prominence of the submandibular glands bilaterally with some stranding about both glands. This is suggestive of sialoadenitis. No  duct obstruction is present. 3. No significant cervical adenopathy. 4. Atherosclerotic calcifications within the carotid bifurcations bilaterally without focal stenosis. These results were called by telephone at the time of interpretation on 06/26/2023 at 4:48 pm to provider KAYLA KEITH , who verbally acknowledged these results. Electronically Signed   By: Lonni Necessary M.D.   On: 06/26/2023 16:51    Disposition:    Discharge disposition: 01-Home or Self Care       Discharge Instructions     Diet - low sodium heart healthy   Complete by: As directed    Increase activity slowly   Complete by: As directed         Allergies as of 06/27/2023       Reactions   Ciprofloxacin  Hcl Other (See Comments)   Burning at IV site   Metformin And Related Other (See Comments)   CKD    Ace Inhibitors Cough   Lisinopril   Amlodipine  Swelling   Lower leg swelling.    Baclofen  Nausea Only, Other (See Comments)   Disoriented, dizziness, weakness, fatigue.    Feraheme  [ferumoxytol ] Other (See Comments)   Chest pain    Lisinopril Palpitations        Medication List     STOP taking these medications    amoxicillin -clavulanate 250-125 MG tablet Commonly known as: AUGMENTIN  Replaced by: amoxicillin -clavulanate 500-125 MG tablet   fluconazole  150 MG tablet Commonly known as: DIFLUCAN        TAKE these medications    Accu-Chek FastClix Lancets Misc TEST BLOOD SUGAR FOUR TIMES DAILY   acetaminophen  650 MG CR tablet Commonly known as: Tylenol  8 Hour Take 1 tablet (650 mg total) by mouth every 8 (eight) hours as needed for pain.   allopurinol  100 MG tablet Commonly known as: ZYLOPRIM  TAKE ONE TABLET (100MG  TOTAL) BY MOUTH TWICE A WEEK ON WEDNESDAY AND SUNDAY   amoxicillin -clavulanate 500-125 MG tablet Commonly known as: Augmentin  Take 1 tablet by mouth 2 (two) times daily for 12 doses. Replaces: amoxicillin -clavulanate 250-125 MG tablet   atorvastatin  40 MG tablet Commonly known as: LIPITOR TAKE 1 TABLET BY MOUTH EVERY DAY   B-D ULTRAFINE III SHORT PEN 31G X 8 MM Misc Generic drug: Insulin  Pen Needle Use to give insulin  twice a day and victoza  once a day. Dx code = E11.9   carvedilol  25 MG tablet Commonly known as: COREG  Take 25 mg by mouth in the morning.   dexamethasone  6 MG tablet Commonly known as: DECADRON  Take 1 tablet (6 mg total) by mouth daily for 4 days.   furosemide  40 MG tablet Commonly known as: LASIX  Take 40 mg by mouth in the morning.   losartan  100 MG tablet Commonly known as: COZAAR  Take 1 tablet (100 mg total) by mouth daily.   multivitamin with minerals Tabs  tablet Take 1 tablet by mouth 2 (two) times a week.   OneTouch Ultra test strip Generic drug: glucose blood USE TO TEST BLOOD SUGARS 3X DAILY. E11.9   OneTouch Verio w/Device Kit Use to check blood sugars 3x daily. E11.9   oxybutynin  15 MG 24 hr tablet Commonly known as: DITROPAN  XL Take 15 mg by mouth every morning.   polyethylene glycol powder 17 GM/SCOOP powder Commonly known as: MiraLax  Take 17 g by mouth daily as needed (constipation).   Semaglutide (0.25 or 0.5MG /DOS) 2 MG/1.5ML Sopn Inject 0.5 mg into the skin once a week.   spironolactone  25 MG tablet Commonly known as: ALDACTONE  Take 1 tablet (25 mg total) by  mouth daily. Needs Appt prior to next refill.   traMADol  50 MG tablet Commonly known as: Ultram  Take 1 tablet (50 mg total) by mouth every 6 (six) hours as needed.   Vitamin B-12 2500 MCG Subl Place 2,500 mcg under the tongue daily.   Vitamin D  125 MCG (5000 UT) Caps Take 5,000 Units by mouth daily.         Follow-up appointment   PRN Discharge Condition:    good  Physician Statement:   The Patient was personally examined, the discharge assessment and plan has been personally reviewed and I agree with ACNP Stefon Ramthun's assessment and plan. 23 minutes of time have been dedicated to discharge assessment, planning and discharge instructions.   Signed: Jeralyn FORBES Banner 06/27/2023, 1:09 PM

## 2023-06-27 NOTE — Care Management Obs Status (Signed)
 MEDICARE OBSERVATION STATUS NOTIFICATION   Patient Details  Name: Michelle Abbott MRN: [id number] Date of Birth: 07-13-1945   Medicare Observation Status Notification Given:  Yes    Omie Bickers, RN 06/27/2023, 1:42 PM

## 2023-06-27 NOTE — Progress Notes (Signed)
 NAME:  Michelle Abbott, MRN:  [id number], DOB:  08-20-1945, LOS: 1 ADMISSION DATE:  06/26/2023, CONSULTATION DATE: 2/8 REFERRING MD: patt, CHIEF COMPLAINT: Acute epiglottitis  History of Present Illness:  78 year old female patient with known history of hypertension, and CKD stage V was in her usual state of health up until about 1/30 when he began to notice some degree of sore throat and nonproductive cough, she also had some mild swelling and pain in the right side of her neck she treated this at home with gargling salt water she was  actually seen on 2/3 with chief complaint of vaginal discharge and dysuria, has a history of urinary incontinence and worsening burning on urination diagnosed with a urinary tract infection so she was treated with oral Augmentin .  Since that time the urinary symptoms have again subsided however her sore throat has continued and has been ongoing.  Around 2/5 she noted increased swelling particularly over the right neck and up into the right ear, the pain persisted, the swelling seemed to subside as of 2/able to pain continued.  She also noted vocal hoarseness, ongoing painful swallowing, and increased rattling breath sounds when in the more's supine position.  She has been able to swallow, and has not been drooling. Because of  ongoing discomfort she presented to the emergency room for evaluation on 2/8.  She was seen by ER staff, room air saturations 100% did have some painful adenopathy of the right anterior cervical lymph chain a CT of the neck was obtained this showed mild soft tissue prominence at the tongue and base of the vallecula the epiglottis was mildly thickened there were no discrete lesions or fluid collections the vocal cords were midline and symmetric the submandibular glands were prominent bilaterally with some stranding in both glands with findings concerning for acute pharyngitis/epiglottitis she was started on IV steroids, antibiotics, and ENT was consulted  recommending ICU admission  ER eval  RVP negative, group A strep,  Pertinent  Medical History  Hypertension, CKD stage V due to renovascular disease, does have a fistula, type 2 diabetes, with neuropathy, chronic anemia, urinary incont   Significant Hospital Events: Including procedures, antibiotic start and stop dates in addition to other pertinent events   2/8 admitted for epiglottitis  Interim History / Subjective:   Feels better.  Throat swelling and pain is down  Objective   Blood pressure 131/64, pulse 84, temperature 97.7 F (36.5 C), temperature source Oral, resp. rate 14, height 5' 3 (1.6 m), weight 76.1 kg, SpO2 98%.        Intake/Output Summary (Last 24 hours) at 06/27/2023 0948 Last data filed at 06/27/2023 9349 Gross per 24 hour  Intake 100 ml  Output 900 ml  Net -800 ml   Filed Weights   06/26/23 1450 06/27/23 0407  Weight: 75.8 kg 76.1 kg    Examination: Gen:      No acute distress HEENT:  EOMI, sclera anicteric Neck:     No masses; no thyromegaly, no stridor or swelling noted Lungs:    Clear to auscultation bilaterally; normal respiratory effort CV:         Regular rate and rhythm; no murmurs Abd:      + bowel sounds; soft, non-tender; no palpable masses, no distension Ext:    No edema; adequate peripheral perfusion Skin:      Warm and dry; no rash Neuro: alert and oriented x 3 Psych: normal mood and affect   Resolved Hospital Problem list  Assessment & Plan:  Acute epiglottitis Plan Improving symptoms with no evidence of airway compromise Continue IV ceftriaxone .  Discontinue vancomycin  as MRSA is negative Continue steroids Start p.o. diet If she continues to improve then may be discharged today.  Poorly controlled hypertension Plan Typically on Lasix , Coreg , and losartan  in addition to spironolactone  Can continue oral medications as long as she can swallow PRN labetelol  CKD stage V with baseline serum creatinine in the mid 4  range Plan BP control And renal adjust medications A.m. chemistry   Type II DM  Plan Ssi   Anemia of chronic disease Plan Trend  Transfuse for hgb < 7 or active bleeding   Best Practice (right click and Reselect all SmartList Selections daily)   Diet/type: Regular consistency (see orders) DVT prophylaxis prophylactic heparin   Pressure ulcer(s): N/A GI prophylaxis: N/A Lines: N/A Foley:  N/A Code Status:  full code Last date of multidisciplinary goals of care discussion [NA]  Critical care time: NA    Lonna Coder MD [facility] See Amion for pager  If no response to pager , please call [phone] until 7pm After 7:00 pm call Elink  [phone] 06/27/2023, 9:52 AM

## 2023-06-27 NOTE — Progress Notes (Signed)
 Outpatient antibiotic recommendations:   Amoxicillin -clavulanate (Augmentin ) 500-125 mg per tablet, 1 tablet BID x 6 days   Patience Bonito, PharmD, BCPS, BCCCP Clinical Pharmacist

## 2023-06-27 NOTE — Progress Notes (Signed)
 Patient ID: Michelle Abbott, female   DOB: [birth year], 78 y.o.   MRN: [id number]  She is profoundly better with no pain. Able to swallow. Voice normal. The swelling in her anterior neck has resolved.   She should be able to go home based on the great response. CCM will send home on antibiotics. Follow up as needed

## 2023-06-28 ENCOUNTER — Telehealth: Payer: Self-pay | Admitting: Family Medicine

## 2023-06-28 NOTE — Telephone Encounter (Signed)
 Called pt, confirmed ID with DOB.  Pt reports she was hospitalized recently and given additional abx for her recently diagnosed UTI. She has these medications and is continuing to take her abx per instructions. Cultures show susceptibility to Augmentin , no changes needed. Urinary symptoms are improving. Instructed pt again to complete all abx even if symptoms resolve completely. Return precautions given.

## 2023-07-01 DIAGNOSIS — N2581 Secondary hyperparathyroidism of renal origin: Secondary | ICD-10-CM | POA: Diagnosis not present

## 2023-07-01 DIAGNOSIS — N189 Chronic kidney disease, unspecified: Secondary | ICD-10-CM | POA: Diagnosis not present

## 2023-07-01 DIAGNOSIS — N185 Chronic kidney disease, stage 5: Secondary | ICD-10-CM | POA: Diagnosis not present

## 2023-07-01 DIAGNOSIS — I120 Hypertensive chronic kidney disease with stage 5 chronic kidney disease or end stage renal disease: Secondary | ICD-10-CM | POA: Diagnosis not present

## 2023-07-01 DIAGNOSIS — D631 Anemia in chronic kidney disease: Secondary | ICD-10-CM | POA: Diagnosis not present

## 2023-07-01 DIAGNOSIS — I129 Hypertensive chronic kidney disease with stage 1 through stage 4 chronic kidney disease, or unspecified chronic kidney disease: Secondary | ICD-10-CM | POA: Diagnosis not present

## 2023-07-01 LAB — CULTURE, BLOOD (SINGLE): Culture: NO GROWTH

## 2023-07-09 ENCOUNTER — Ambulatory Visit: Payer: 59 | Admitting: Student

## 2023-07-09 VITALS — BP 197/67 | HR 57 | Ht 63.0 in | Wt 166.0 lb

## 2023-07-09 DIAGNOSIS — N3281 Overactive bladder: Secondary | ICD-10-CM | POA: Diagnosis not present

## 2023-07-09 DIAGNOSIS — Z7985 Long-term (current) use of injectable non-insulin antidiabetic drugs: Secondary | ICD-10-CM | POA: Diagnosis not present

## 2023-07-09 DIAGNOSIS — J0510 Acute epiglottitis without obstruction: Secondary | ICD-10-CM

## 2023-07-09 DIAGNOSIS — I152 Hypertension secondary to endocrine disorders: Secondary | ICD-10-CM

## 2023-07-09 DIAGNOSIS — E1159 Type 2 diabetes mellitus with other circulatory complications: Secondary | ICD-10-CM

## 2023-07-09 MED ORDER — MIRABEGRON ER 25 MG PO TB24: 25.0000 mg | ORAL_TABLET | Freq: Every day | ORAL | 1 refills | Status: DC

## 2023-07-09 NOTE — Assessment & Plan Note (Signed)
 Poorly controlled hypertension.  Patient took medications within 1 hour visit.  Asymptomatic.  Heart rate of 56, suspect this related to her carvedilol and she is asymptomatic.  Has cough with ACE inhibitor's. - Continue carvedilol, losartan, spironolactone - Follow-up in 2 weeks

## 2023-07-09 NOTE — Progress Notes (Addendum)
    SUBJECTIVE:   CHIEF COMPLAINT / HPI:   Hospital Follow-up Presented to ED on 06/26/2023 with right throat pain, trouble swallowing. Patient was admitted on 06/26/2023 for epiglottitis, and discharged on 06/27/2023.  Hospital course was uncomplicated, had IV antibiotics and then transition to oral antibiotics.  Additionally, she was treated for UTI.  Today reports no symptoms.  Overactive bladder Patient reports continued nocturia 3 episodes per night, and increased frequency during the day.  Recently treated for UTI, and culture shows susceptibility to antibiotics.  Currently on oxybutynin, however having side effect of dry mouth.  She is followed by urology, but states she has not seen them in a while.  Unable to see records in chart.  Hypertension Elevated blood pressure today.  Patient reports she did take her medications until right before this appointment.  Reports compliance with Carvedilol 25 mg daily, losartan 100 mg daily and spironolactone 25 mg daily.  OBJECTIVE:   BP (!) 197/67   Pulse (!) 57   Ht 5\' 3"  (1.6 m)   Wt 166 lb (75.3 kg)   SpO2 100%   BMI 29.41 kg/m    General: NAD, pleasant HEENT: Normocephalic, atraumatic head. Normal external ear, canal, TM bilaterally. EOM intact and normal conjunctiva BL. Normal external nose. Throat not erythematous, no exudate, no deviation. Cardio: RRR, no MRG. Cap Refill <2s. Respiratory: CTAB, normal wob on RA Skin: Warm and dry  ASSESSMENT/PLAN:   Assessment & Plan Epiglottitis Resolved.  Asymptomatic today. Hypertension associated with diabetes (HCC) Poorly controlled hypertension.  Patient took medications within 1 hour visit.  Asymptomatic.  Heart rate of 56, suspect this related to her carvedilol and she is asymptomatic.  Has cough with ACE inhibitor's. - Continue carvedilol, losartan, spironolactone - Follow-up in 2 weeks Overactive bladder Followed by urology.  On oxybutynin, but still having nocturia 3 episodes per  night.  Additionally has side effect of dry mouth. - Trial Myrbetriq   Tiffany Kocher, DO [facility]

## 2023-07-09 NOTE — Assessment & Plan Note (Signed)
 Resolved.  Asymptomatic today.

## 2023-07-09 NOTE — Patient Instructions (Signed)
 It was great to see you! Thank you for allowing me to participate in your care!   I recommend that you always bring your medications to each appointment as this makes it easy to ensure we are on the correct medications and helps [place] not miss when refills are needed.  Our plans for today:  -I have sent a prescription for mirabegron (Myrbetriq) to assist with overactive bladder.  This medication may not be covered by insurance, and may require prior authorization.  If you are able to obtain his medication, take 25 mg once daily before bedtime. -Please take your blood pressure medications at the same time every day, I recommend taking them before bed or first thing in the morning. -Please follow-up in 2 weeks for blood pressure recheck.  If you develop headaches, shortness of breath, chest pain with your high blood pressure please go to the emergency room.    Take care and seek immediate care sooner if you develop any concerns. Please remember to show up 15 minutes before your scheduled appointment time!  Tiffany Kocher, DO [facility]

## 2023-07-22 ENCOUNTER — Other Ambulatory Visit: Payer: Self-pay | Admitting: Family Medicine

## 2023-07-26 ENCOUNTER — Ambulatory Visit: Payer: 59 | Admitting: Student

## 2023-08-09 ENCOUNTER — Other Ambulatory Visit: Payer: Self-pay | Admitting: Family Medicine

## 2023-08-09 DIAGNOSIS — I152 Hypertension secondary to endocrine disorders: Secondary | ICD-10-CM

## 2023-08-31 ENCOUNTER — Other Ambulatory Visit: Payer: Self-pay | Admitting: Family Medicine

## 2023-08-31 DIAGNOSIS — E1159 Type 2 diabetes mellitus with other circulatory complications: Secondary | ICD-10-CM

## 2023-09-01 DIAGNOSIS — D631 Anemia in chronic kidney disease: Secondary | ICD-10-CM | POA: Diagnosis not present

## 2023-09-01 DIAGNOSIS — N189 Chronic kidney disease, unspecified: Secondary | ICD-10-CM | POA: Diagnosis not present

## 2023-09-01 DIAGNOSIS — N185 Chronic kidney disease, stage 5: Secondary | ICD-10-CM | POA: Diagnosis not present

## 2023-09-01 DIAGNOSIS — N2581 Secondary hyperparathyroidism of renal origin: Secondary | ICD-10-CM | POA: Diagnosis not present

## 2023-09-01 DIAGNOSIS — I120 Hypertensive chronic kidney disease with stage 5 chronic kidney disease or end stage renal disease: Secondary | ICD-10-CM | POA: Diagnosis not present

## 2023-09-01 DIAGNOSIS — I129 Hypertensive chronic kidney disease with stage 1 through stage 4 chronic kidney disease, or unspecified chronic kidney disease: Secondary | ICD-10-CM | POA: Diagnosis not present

## 2023-09-06 ENCOUNTER — Telehealth: Payer: Self-pay | Admitting: Family Medicine

## 2023-09-06 NOTE — Telephone Encounter (Signed)
 Called to schedule patient for appointment Needs to follow-up on diabetes/htn. LVM to call back.   Thanks!

## 2023-09-06 NOTE — Telephone Encounter (Signed)
-----   Message from Ivin Marrow sent at 09/06/2023 12:52 PM EDT ----- Genette Kent when you have a moment can you call to schedule patient for an appointment. Needs to follow-up on diabetes/htn. Thank you!  Michael

## 2023-09-07 ENCOUNTER — Other Ambulatory Visit: Payer: Self-pay | Admitting: Family Medicine

## 2023-09-08 ENCOUNTER — Other Ambulatory Visit: Payer: Self-pay | Admitting: Family Medicine

## 2023-09-08 DIAGNOSIS — Z1231 Encounter for screening mammogram for malignant neoplasm of breast: Secondary | ICD-10-CM

## 2023-09-14 ENCOUNTER — Ambulatory Visit (HOSPITAL_COMMUNITY)
Admission: RE | Admit: 2023-09-14 | Discharge: 2023-09-14 | Disposition: A | Discharge status: Home / Self Care | Source: Ambulatory Visit | Attending: Nephrology | Admitting: Nephrology

## 2023-09-14 VITALS — BP 177/69 | HR 60 | Temp 97.8°F | Resp 17

## 2023-09-14 DIAGNOSIS — D631 Anemia in chronic kidney disease: Secondary | ICD-10-CM | POA: Diagnosis not present

## 2023-09-14 DIAGNOSIS — N189 Chronic kidney disease, unspecified: Secondary | ICD-10-CM | POA: Diagnosis not present

## 2023-09-14 DIAGNOSIS — N185 Chronic kidney disease, stage 5: Secondary | ICD-10-CM | POA: Insufficient documentation

## 2023-09-14 LAB — POCT HEMOGLOBIN-HEMACUE: Hemoglobin: 8.3 g/dL — ABNORMAL LOW (ref 12.0–15.0)

## 2023-09-14 MED ORDER — EPOETIN ALFA 10000 UNIT/ML IJ SOLN
10000.0000 [IU] | Freq: Once | INTRAMUSCULAR | Status: AC
  Administered 2023-09-14: 10000 [IU] via SUBCUTANEOUS

## 2023-09-14 MED ORDER — EPOETIN ALFA 10000 UNIT/ML IJ SOLN
INTRAMUSCULAR | Status: AC
  Filled 2023-09-14: qty 1

## 2023-09-20 ENCOUNTER — Encounter: Payer: Self-pay | Admitting: Family Medicine

## 2023-09-20 ENCOUNTER — Ambulatory Visit (INDEPENDENT_AMBULATORY_CARE_PROVIDER_SITE_OTHER): Admitting: Family Medicine

## 2023-09-20 VITALS — BP 165/63 | HR 52 | Ht 63.0 in | Wt 164.1 lb

## 2023-09-20 DIAGNOSIS — N185 Chronic kidney disease, stage 5: Secondary | ICD-10-CM

## 2023-09-20 DIAGNOSIS — E1140 Type 2 diabetes mellitus with diabetic neuropathy, unspecified: Secondary | ICD-10-CM | POA: Diagnosis not present

## 2023-09-20 DIAGNOSIS — Z794 Long term (current) use of insulin: Secondary | ICD-10-CM | POA: Diagnosis not present

## 2023-09-20 MED ORDER — SEMAGLUTIDE(0.25 OR 0.5MG/DOS) 2 MG/1.5ML ~~LOC~~ SOPN: 0.2500 mg | PEN_INJECTOR | SUBCUTANEOUS | 0 refills | Status: AC

## 2023-09-20 NOTE — Patient Instructions (Signed)
 It was great to see you! Thank you for allowing me to participate in your care!  Our plans for today:  - Make sure to see your Vascular surgeons. - I will let you know the results of your labs today. - Please restart taking spironolactone .   Please arrive 15 minutes PRIOR to your next scheduled appointment time! If you do not, this affects OTHER patients' care.  Take care and seek immediate care sooner if you develop any concerns.   Ivin Marrow, MD, PGY-2 [facility] 2:54 PM 09/20/2023  [facility]

## 2023-09-20 NOTE — Assessment & Plan Note (Signed)
 Last A1c 5.4.  Given weight loss of 20 pounds over past year and greater than 50 over past 3 years.  Will reduce Ozempic  to 0.25 mg weekly.  Recheck A1c 3 months.

## 2023-09-20 NOTE — Progress Notes (Signed)
    SUBJECTIVE:   CHIEF COMPLAINT / HPI: chronic illness f/u  Saw nephrologist a couple weeks ago. NO need to start dialysis yet. Getting iron infusions now once per month. Stopped spironolactone  because she was worried about it after reading online. Stopped spiro less than a month ago. Reports there was some tape that stated it was a "hazard". Is taking Lasix . Would like to stop Ozempic  because she states she is losing weight. No nausea or vomiting.  PERTINENT  PMH / PSH: HTN, ESRD, T2DM  OBJECTIVE:   BP (!) 179/63   Pulse (!) 52   Ht 5\' 3"  (1.6 m)   Wt 164 lb 2 oz (74.4 kg)   SpO2 100%   BMI 29.07 kg/m   General: NAD Neuro: A&O Cardiovascular: RRR, no murmurs, trace peripheral edema Respiratory: normal WOB on RA, CTAB, no wheezes, ronchi or rales Abdomen: soft, NTTP, no rebound or guarding Extremities: Moving all 4 extremities equally    ASSESSMENT/PLAN:   Assessment & Plan Chronic kidney disease, stage 5 (HCC) Per last lab results that I can see in EMR, creatinine and potassium are stable.  As patient stopped spironolactone  will recheck potassium today.  Today no uremic symptoms, euvolemic on exam. Will start spironolactone  pending normal potassium. Continue follow-up with nephrology and vascular surgery. -BMP Type 2 diabetes mellitus with diabetic neuropathy, with long-term current use of insulin  (HCC) Last A1c 5.4.  Given weight loss of 20 pounds over past year and greater than 50 over past 3 years.  Will reduce Ozempic  to 0.25 mg weekly.  Recheck A1c 3 months.  Return in about 3 months (around 12/21/2023).  Ivin Marrow, MD [facility]

## 2023-09-20 NOTE — Assessment & Plan Note (Signed)
 Per last lab results that I can see in EMR, creatinine and potassium are stable.  As patient stopped spironolactone  will recheck potassium today.  Today no uremic symptoms, euvolemic on exam. Will start spironolactone  pending normal potassium. Continue follow-up with nephrology and vascular surgery. -BMP

## 2023-09-21 LAB — BASIC METABOLIC PANEL WITH GFR
BUN/Creatinine Ratio: 13 (ref 12–28)
BUN: 64 mg/dL — ABNORMAL HIGH (ref 8–27)
CO2: 19 mmol/L — ABNORMAL LOW (ref 20–29)
Calcium: 9.5 mg/dL (ref 8.7–10.3)
Chloride: 112 mmol/L — ABNORMAL HIGH (ref 96–106)
Creatinine, Ser: 4.75 mg/dL — ABNORMAL HIGH (ref 0.57–1.00)
Glucose: 81 mg/dL (ref 70–99)
Potassium: 4.7 mmol/L (ref 3.5–5.2)
Sodium: 145 mmol/L — ABNORMAL HIGH (ref 134–144)
eGFR: 9 mL/min/{1.73_m2} — ABNORMAL LOW (ref >59)

## 2023-09-23 ENCOUNTER — Ambulatory Visit: Payer: 59

## 2023-09-23 VITALS — Ht 63.0 in | Wt 164.0 lb

## 2023-09-23 DIAGNOSIS — Z0000 Encounter for general adult medical examination without abnormal findings: Secondary | ICD-10-CM | POA: Diagnosis not present

## 2023-09-23 NOTE — Patient Instructions (Addendum)
 Ms. Blacknall , Thank you for taking time out of your busy schedule to complete your Annual Wellness Visit with me. I enjoyed our conversation and look forward to speaking with you again next year. I, as well as your care team,  appreciate your ongoing commitment to your health goals. Please review the following plan we discussed and let me know if I can assist you in the future. Your Game plan/ To Do List    Referrals: If you haven't heard from the office you've been referred to, please reach out to them at the phone provided.   Follow up Visits: Next Medicare AWV with our clinical staff: 09/28/2024 at 2:50 p.m. PHONE VISIT   Have you seen your provider in the last 6 months (3 months if uncontrolled diabetes)? Yes   Clinician Recommendations:  Aim for 30 minutes of exercise or brisk walking, 6-8 glasses of water, and 5 servings of fruits and vegetables each day.       This is a list of the screening recommended for you and due dates:  Health Maintenance  Topic Date Due   DTaP/Tdap/Td vaccine (1 - Tdap) Never done   Zoster (Shingles) Vaccine (1 of 2) Never done   Eye exam for diabetics  07/21/2018   Complete foot exam   05/31/2021   COVID-19 Vaccine (3 - 2024-25 season) 01/17/2023   Flu Shot  12/17/2023   Hemoglobin A1C  12/24/2023   Medicare Annual Wellness Visit  09/22/2024   Colon Cancer Screening  09/22/2027   Pneumonia Vaccine  Completed   DEXA scan (bone density measurement)  Completed   Hepatitis C Screening  Completed   HPV Vaccine  Aged Out   Meningitis B Vaccine  Aged Out    Advanced directives: (Declined) Advance directive discussed with you today. Even though you declined this today, please call our office should you change your mind, and we can give you the proper paperwork for you to fill out. Advance Care Planning is important because it:  [x]  Makes sure you receive the medical care that is consistent with your values, goals, and preferences  [x]  It provides  guidance to your family and loved ones and reduces their decisional burden about whether or not they are making the right decisions based on your wishes.  Follow the link provided in your after visit summary or read over the paperwork we have mailed to you to help you started getting your Advance Directives in place. If you need assistance in completing these, please reach out to us  so that we can help you!  See attachments for Preventive Care and Fall Prevention Tips.

## 2023-09-23 NOTE — Progress Notes (Signed)
 Because this visit was a virtual/telehealth visit,  certain criteria was not obtained, such a blood pressure, CBG if applicable, and timed get up and go. Any medications not marked as "taking" were not mentioned during the medication reconciliation part of the visit. Any vitals not documented were not able to be obtained due to this being a telehealth visit or patient was unable to self-report a recent blood pressure reading due to a lack of equipment at home via telehealth. Vitals that have been documented are verbally provided by the patient.   Subjective:   Michelle Abbott is a 78 y.o. who presents for a Medicare Wellness preventive visit.  Visit Complete: Virtual I connected with  Michelle Abbott on 09/23/23 by a audio enabled telemedicine application and verified that I am speaking with the correct person using two identifiers.  Patient Location: Home  Provider Location: Office/Clinic  I discussed the limitations of evaluation and management by telemedicine. The patient expressed understanding and agreed to proceed.  Vital Signs: Because this visit was a virtual/telehealth visit, some criteria may be missing or patient reported. Any vitals not documented were not able to be obtained and vitals that have been documented are patient reported.  VideoDeclined- This patient declined Librarian, academic. Therefore the visit was completed with audio only.  Persons Participating in Visit: Patient.  AWV Questionnaire: No: Patient Medicare AWV questionnaire was not completed prior to this visit.  Cardiac Risk Factors include: advanced age (>39men, >76 women);sedentary lifestyle;dyslipidemia;diabetes mellitus;family history of premature cardiovascular disease;hypertension     Objective:     Today's Vitals   09/23/23 1412  Weight: 164 lb (74.4 kg)  Height: 5\' 3"  (1.6 m)  PainSc: 5   PainLoc: Back   Body mass index is 29.05 kg/m.      09/23/2023    2:16 PM 09/20/2023    2:36 PM 07/09/2023    1:44 PM 06/26/2023    2:52 PM 06/23/2023    9:54 AM 06/08/2023    1:51 PM 02/02/2023    7:18 AM  Advanced Directives  Does Patient Have a Medical Advance Directive? No No No No No No No  Would patient like information on creating a medical advance directive? No - Patient declined No - Patient declined No - Patient declined  No - Patient declined No - Patient declined No - Patient declined    Current Medications (verified) Outpatient Encounter Medications as of 09/23/2023  Medication Sig   ACCU-CHEK FASTCLIX LANCETS MISC TEST BLOOD SUGAR FOUR TIMES DAILY   acetaminophen  (TYLENOL  8 HOUR) 650 MG CR tablet Take 1 tablet (650 mg total) by mouth every 8 (eight) hours as needed for pain.   allopurinol  (ZYLOPRIM ) 100 MG tablet TAKE ONE TABLET (100MG  TOTAL) BY MOUTH TWICE A WEEK ON WEDNESDAY AND SUNDAY   atorvastatin  (LIPITOR) 40 MG tablet TAKE 1 TABLET BY MOUTH EVERY DAY   B-D ULTRAFINE III SHORT PEN 31G X 8 MM MISC Use to give insulin  twice a day and victoza  once a day. Dx code = E11.9   Blood Glucose Monitoring Suppl (ONETOUCH VERIO) w/Device KIT Use to check blood sugars 3x daily. E11.9   carvedilol  (COREG ) 25 MG tablet Take 25 mg by mouth in the morning.   Cholecalciferol  (VITAMIN D ) 125 MCG (5000 UT) CAPS Take 5,000 Units by mouth daily.   Cyanocobalamin  (VITAMIN B-12) 2500 MCG SUBL Place 2,500 mcg under the tongue daily.   furosemide  (LASIX ) 40 MG tablet Take 40 mg by mouth  in the morning.   losartan  (COZAAR ) 100 MG tablet Take 1 tablet (100 mg total) by mouth daily.   mirabegron  ER (MYRBETRIQ ) 25 MG TB24 tablet Take 1 tablet (25 mg total) by mouth daily.   Multiple Vitamin (MULTIVITAMIN WITH MINERALS) TABS tablet Take 1 tablet by mouth 2 (two) times a week.   ONETOUCH ULTRA test strip USE TO TEST BLOOD SUGARS 3X DAILY. E11.9   oxybutynin  (DITROPAN  XL) 15 MG 24 hr tablet Take 15 mg by mouth every morning.   polyethylene glycol powder (MIRALAX )  17 GM/SCOOP powder Take 17 g by mouth daily as needed (constipation).   Semaglutide ,0.25 or 0.5MG /DOS, 2 MG/1.5ML SOPN Inject 0.25 mg into the skin once a week. 0.25 mg once weekly for 4 weeks then increase to 0.5 mg weekly for at least 4 weeks,max 1 mg   spironolactone  (ALDACTONE ) 25 MG tablet TAKE ONE TABLET (25 MG TOTAL) BY MOUTH ONCE DAILY. (NEEDS APPT PRIOR TO NEXT REFILL) (Patient not taking: Reported on 09/20/2023)   traMADol  (ULTRAM ) 50 MG tablet Take 1 tablet (50 mg total) by mouth every 6 (six) hours as needed.   No facility-administered encounter medications on file as of 09/23/2023.    Allergies (verified) Ciprofloxacin  hcl, Metformin and related, Ace inhibitors, Amlodipine , Baclofen , Feraheme  [ferumoxytol ], and Lisinopril   History: Past Medical History:  Diagnosis Date   Acute kidney injury superimposed on chronic kidney disease (HCC) 04/28/2020   Anemia    Arthritis    Chronic kidney disease    Stage 4   Diabetes mellitus without complication (HCC)    type 2   GERD (gastroesophageal reflux disease)    Gout 2017   Hyperlipidemia    Hypertension    Hypertensive nephropathy 05/09/2021   Nephrotic range proteinuria 05/09/2021   Postoperative pain 04/29/2020   Rectal bleeding, History of 12/28/2019   Sciatica    Secondary hyperparathyroidism of renal origin (HCC) 05/09/2021   SI (sacroiliac) joint inflammation (HCC) 03/12/2019   UTI (urinary tract infection) 02/02/2019   Vitamin D  deficiency 2014   Past Surgical History:  Procedure Laterality Date   AV FISTULA PLACEMENT Left 12/08/2022   Procedure: CREATION OF LEFT ARM  BRACHIO BASILIC FISTULA   (FIRST STAGE);  Surgeon: Kayla Part, MD;  Location: [facility] OR;  Service: Vascular;  Laterality: Left;   BASCILIC VEIN TRANSPOSITION Left 02/02/2023   Procedure: LEFT ARM SECOND STAGE BASILIC VEIN TRANSPOSITION;  Surgeon: Kayla Part, MD;  Location: [facility] OR;  Service: Vascular;  Laterality: Left;   BREAST SURGERY Right 1961    Benign lump removed   CATARACT EXTRACTION, BILATERAL Bilateral 2017   CESAREAN SECTION  1985   CESAREAN SECTION  1990   CHOLECYSTECTOMY  1999   COLONOSCOPY     DILATION AND CURETTAGE OF UTERUS  2017   KNEE ARTHROSCOPY Right    VAGINAL SEPTOPLASTY  1966   Family History  Problem Relation Age of Onset   Cancer Mother        ovarian and pancreatic   Diabetes Mother    Stroke Mother    COPD Father    Arthritis Father    Cancer Brother        Lung   COPD Brother    Social History   Socioeconomic History   Marital status: Widowed    Spouse name: Not on file   Number of children: 2   Years of education: 21   Highest education level: Some college, no degree  Occupational History   Occupation:  instructor     Comment: Developmentally disabled persons   Occupation: Retired  Tobacco Use   Smoking status: Former    Current packs/day: 0.00    Average packs/day: 1.5 packs/day for 30.0 years (45.0 ttl pk-yrs)    Types: Cigarettes    Start date: 05/19/1967    Quit date: 05/18/1997    Years since quitting: 26.3    Passive exposure: Past   Smokeless tobacco: Never   Tobacco comments:    1.5 - 2 ppd for 30 years:   no plans to re-start  Vaping Use   Vaping status: Never Used  Substance and Sexual Activity   Alcohol use: No   Drug use: No   Sexual activity: Not Currently  Other Topics Concern   Not on file  Social History Narrative   Lives by herself in one level apt. Daughter lives in same complex. Has grab bars in bathroom. Smoke alarms, one rug under coffee table and at door.   No pets.   Recently moved from [place] in last two years.    Daughter helps with transportation.      Likes to eat meat, some vegetables, fruit. Drinks to drink sweet tea, some coffee. Get $15 in food stamps but runs out of food before end of month.   Enjoys shopping and fellowship. Writes poetry and plays. Word puzzles.   Social Drivers of Corporate investment banker Strain: Low Risk  (09/23/2023)    Overall Financial Resource Strain (CARDIA)    Difficulty of Paying Living Expenses: Not hard at all  Food Insecurity: No Food Insecurity (09/23/2023)   Hunger Vital Sign    Worried About Running Out of Food in the Last Year: Never true    Ran Out of Food in the Last Year: Never true  Transportation Needs: No Transportation Needs (09/23/2023)   PRAPARE - Administrator, Civil Service (Medical): No    Lack of Transportation (Non-Medical): No  Physical Activity: Insufficiently Active (09/23/2023)   Exercise Vital Sign    Days of Exercise per Week: 1 day    Minutes of Exercise per Session: 60 min  Stress: No Stress Concern Present (09/23/2023)   [employer] of Occupational Health - Occupational Stress Questionnaire    Feeling of Stress : Only a little  Social Connections: Moderately Integrated (09/23/2023)   Social Connection and Isolation Panel [NHANES]    Frequency of Communication with Friends and Family: More than three times a week    Frequency of Social Gatherings with Friends and Family: More than three times a week    Attends Religious Services: More than 4 times per year    Active Member of [employer] or Organizations: Yes    Attends Banker Meetings: More than 4 times per year    Marital Status: Widowed    Tobacco Counseling Counseling given: Not Answered Tobacco comments: 1.5 - 2 ppd for 30 years:   no plans to re-start    Clinical Intake:  Pre-visit preparation completed: Yes  Pain : 0-10 Pain Score: 5  Pain Type: Chronic pain Pain Location: Back Pain Orientation: Lower Pain Descriptors / Indicators: Dull, Discomfort, Throbbing     BMI - recorded: 29.06 Nutritional Risks: None Diabetes: Yes CBG done?: No Did pt. bring in CBG monitor from home?: No  Lab Results  Component Value Date   HGBA1C 5.4 06/26/2023   HGBA1C 5.4 06/08/2023   HGBA1C 5.9 09/17/2022     How often do you need to  have someone help you when you read instructions,  pamphlets, or other written materials from your doctor or pharmacy?: 1 - Never What is the last grade level you completed in school?: HSG  Interpreter Needed?: No  Information entered by :: Druscilla Gerhard, LPN.   Activities of Daily Living     09/23/2023    2:16 PM 06/26/2023   10:00 PM  In your present state of health, do you have any difficulty performing the following activities:  Hearing? 0 0  Vision? 0 0  Difficulty concentrating or making decisions? 0 0  Walking or climbing stairs? 0   Dressing or bathing? 0   Doing errands, shopping? 0 0  Preparing Food and eating ? N   Using the Toilet? N   In the past six months, have you accidently leaked urine? Y   Comment overactive bladder, patient wears protective underwear   Do you have problems with loss of bowel control? N   Managing your Medications? N   Managing your Finances? N   Housekeeping or managing your Housekeeping? N     Patient Care Team: Ivin Marrow, MD as PCP - General (Family Medicine) Koval, Peter G, RPH-CPP (Pharmacist) Verlena Glenn, MD as Consulting Physician (Nephrology) Dorothe Gaster, RD as Dietitian (Family Medicine) Ric Chain as Consulting Physician (Optometry)  Indicate any recent Medical Services you may have received from other than [facility] providers in the past year (date may be approximate).     Assessment:    This is a routine wellness examination for [place].  Hearing/Vision screen Hearing Screening - Comments:: Patient has age-related hearing loss. No hearing aids. Vision Screening - Comments:: Wears rx glasses - up to date with routine eye exams with Dortha Gauss, OD.    Goals Addressed               This Visit's Progress     Patient Stated (pt-stated)        09/23/2023: My goal is to get off some of these medications.       Depression Screen     09/23/2023    2:26 PM 09/20/2023    2:36 PM 07/09/2023    1:43 PM 06/23/2023    9:54 AM 06/08/2023    2:01 PM  05/04/2023    1:17 PM 10/15/2022   10:49 AM  PHQ 2/9 Scores  PHQ - 2 Score 0 0 0 0 0 0 0  PHQ- 9 Score 2 2 1 2 3 3  0    Fall Risk     09/23/2023    2:15 PM 09/20/2023    2:36 PM 07/09/2023    1:42 PM 06/08/2023    1:51 PM 05/04/2023    1:17 PM  Fall Risk   Falls in the past year? 0 0 0 0 0  Number falls in past yr: 0 0 0 0 0  Injury with Fall? 0 0 0 0 0  Risk for fall due to : No Fall Risks      Follow up Falls prevention discussed;Falls evaluation completed        MEDICARE RISK AT HOME:  Medicare Risk at Home Any stairs in or around the home?: No If so, are there any without handrails?: No Home free of loose throw rugs in walkways, pet beds, electrical cords, etc?: Yes Adequate lighting in your home to reduce risk of falls?: Yes Life alert?: No Use of a cane, walker or w/c?: Yes Grab bars in the bathroom?: Yes Shower chair  or bench in shower?: Yes Elevated toilet seat or a handicapped toilet?: No  TIMED UP AND GO:  Was the test performed?  No  Cognitive Function: 6CIT completed    09/23/2023    2:18 PM 04/07/2018    2:04 PM  MMSE - Mini Mental State Exam  Not completed: Unable to complete   Orientation to time  5  Orientation to Place  5  Registration  3  Attention/ Calculation  5  Recall  3  Language- name 2 objects  2  Language- repeat  1  Language- follow 3 step command  3  Language- read & follow direction  1  Write a sentence  1  Copy design  1  Total score  30        09/23/2023    2:15 PM 09/18/2022    2:22 PM 04/07/2018    2:05 PM  6CIT Screen  What Year? 0 points 0 points 0 points  What month? 0 points 0 points 0 points  What time? 0 points 0 points 0 points  Count back from 20 0 points 0 points 0 points  Months in reverse 0 points 0 points 0 points  Repeat phrase 0 points 0 points 0 points  Total Score 0 points 0 points 0 points    Immunizations Immunization History  Administered Date(s) Administered   Fluad Quad(high Dose 65+) 03/09/2019,  05/02/2020, 06/20/2021, 03/19/2022   Fluad Trivalent(High Dose 65+) 05/04/2023   Influenza,inj,Quad PF,6+ Mos 03/28/2018   Influenza-Unspecified 03/10/2016, 05/06/2017, 05/04/2023   Moderna Sars-Covid-2 Vaccination 06/30/2019, 07/28/2019   Pneumococcal Conjugate-13 03/10/2016   Pneumococcal Polysaccharide-23 12/23/2017    Screening Tests Health Maintenance  Topic Date Due   DTaP/Tdap/Td (1 - Tdap) Never done   Zoster Vaccines- Shingrix (1 of 2) Never done   OPHTHALMOLOGY EXAM  07/21/2018   FOOT EXAM  05/31/2021   COVID-19 Vaccine (3 - 2024-25 season) 01/17/2023   INFLUENZA VACCINE  12/17/2023   HEMOGLOBIN A1C  12/24/2023   Medicare Annual Wellness (AWV)  09/22/2024   Colonoscopy  09/22/2027   Pneumonia Vaccine 14+ Years old  Completed   DEXA SCAN  Completed   Hepatitis C Screening  Completed   HPV VACCINES  Aged Out   Meningococcal B Vaccine  Aged Out    Health Maintenance  Health Maintenance Due  Topic Date Due   DTaP/Tdap/Td (1 - Tdap) Never done   Zoster Vaccines- Shingrix (1 of 2) Never done   OPHTHALMOLOGY EXAM  07/21/2018   FOOT EXAM  05/31/2021   COVID-19 Vaccine (3 - 2024-25 season) 01/17/2023   Health Maintenance Items Addressed: Yes Patient aware of current care gaps.  Patient is due for the following: Eye Exam, Foot Exam, Shingrix, and DTaP vaccine.  Additional Screening:  Vision Screening: Recommended annual ophthalmology exams for early detection of glaucoma and other disorders of the eye.  Dental Screening: Recommended annual dental exams for proper oral hygiene  Community Resource Referral / Chronic Care Management: CRR required this visit?  No   CCM required this visit?  No   Plan:    I have personally reviewed and noted the following in the patient's chart:   Medical and social history Use of alcohol, tobacco or illicit drugs  Current medications and supplements including opioid prescriptions. Patient is not currently taking opioid  prescriptions. Functional ability and status Nutritional status Physical activity Advanced directives List of other physicians Hospitalizations, surgeries, and ER visits in previous 12 months Vitals Screenings to include cognitive,  depression, and falls Referrals and appointments  In addition, I have reviewed and discussed with patient certain preventive protocols, quality metrics, and best practice recommendations. A written personalized care plan for preventive services as well as general preventive health recommendations were provided to patient.   Margette Sheldon, LPN   08/22/4257   After Visit Summary: (MyChart) Due to this being a telephonic visit, the after visit summary with patients personalized plan was offered to patient via MyChart   Nurse Notes: Patient aware of current care gaps.  Patient is due for the following: Eye Exam, Foot Exam, Shingrix, and DTaP vaccine. Patient stated that she is up to date with eye exam and saw Dr. Griselda Lederer.  This nurse will request records. All vaccines were verified via [facility].

## 2023-09-24 ENCOUNTER — Telehealth: Payer: Self-pay | Admitting: Family Medicine

## 2023-09-24 DIAGNOSIS — N185 Chronic kidney disease, stage 5: Secondary | ICD-10-CM

## 2023-09-24 NOTE — Telephone Encounter (Signed)
 Call patient regarding recent BMP results from clinic visit.  Potassium 4.7.  Creatinine stable.  Instructed to restart her spironolactone .  As this can increase potassium.  Will recheck next week with lab visit.  Future BMP order placed.  Discussed with patient.  Appointment scheduled for 4:00 on 5/15.

## 2023-09-27 ENCOUNTER — Other Ambulatory Visit (HOSPITAL_COMMUNITY): Payer: Self-pay | Admitting: *Deleted

## 2023-09-28 ENCOUNTER — Ambulatory Visit (HOSPITAL_COMMUNITY)
Admission: RE | Admit: 2023-09-28 | Discharge: 2023-09-28 | Disposition: A | Discharge status: Home / Self Care | Source: Ambulatory Visit | Attending: Nephrology | Admitting: Nephrology

## 2023-09-28 VITALS — BP 175/71 | HR 61 | Resp 17

## 2023-09-28 DIAGNOSIS — N185 Chronic kidney disease, stage 5: Secondary | ICD-10-CM | POA: Diagnosis not present

## 2023-09-28 DIAGNOSIS — D631 Anemia in chronic kidney disease: Secondary | ICD-10-CM | POA: Insufficient documentation

## 2023-09-28 DIAGNOSIS — N189 Chronic kidney disease, unspecified: Secondary | ICD-10-CM | POA: Insufficient documentation

## 2023-09-28 LAB — POCT HEMOGLOBIN-HEMACUE: Hemoglobin: 8.9 g/dL — ABNORMAL LOW (ref 12.0–15.0)

## 2023-09-28 LAB — IRON AND TIBC
Iron: 40 ug/dL (ref 28–170)
Saturation Ratios: 19 % (ref 10.4–31.8)
TIBC: 207 ug/dL — ABNORMAL LOW (ref 250–450)
UIBC: 167 ug/dL

## 2023-09-28 LAB — FERRITIN: Ferritin: 642 ng/mL — ABNORMAL HIGH (ref 11–307)

## 2023-09-28 MED ORDER — EPOETIN ALFA-EPBX 40000 UNIT/ML IJ SOLN
40000.0000 [IU] | Freq: Once | INTRAMUSCULAR | Status: AC
  Administered 2023-09-28: 40000 [IU] via SUBCUTANEOUS

## 2023-09-28 MED ORDER — EPOETIN ALFA-EPBX 40000 UNIT/ML IJ SOLN
INTRAMUSCULAR | Status: AC
  Filled 2023-09-28: qty 1

## 2023-09-29 ENCOUNTER — Other Ambulatory Visit: Payer: Self-pay | Admitting: Family Medicine

## 2023-09-29 DIAGNOSIS — E1159 Type 2 diabetes mellitus with other circulatory complications: Secondary | ICD-10-CM

## 2023-09-30 ENCOUNTER — Other Ambulatory Visit: Payer: Self-pay

## 2023-10-06 ENCOUNTER — Other Ambulatory Visit: Payer: Self-pay

## 2023-10-06 DIAGNOSIS — N185 Chronic kidney disease, stage 5: Secondary | ICD-10-CM

## 2023-10-08 ENCOUNTER — Telehealth: Payer: Self-pay

## 2023-10-08 NOTE — Telephone Encounter (Signed)
 Received call from Amye Baller at [facility] regarding order being faxed to our office that needs physician signature.   Paperwork is in PCP box for completion.   Elsie Halo, RN

## 2023-10-13 NOTE — Telephone Encounter (Signed)
 Called patient regarding request for urinalysis for possible urinary tract infection from [facility].  Patient reports no dysuria or frequency or new urine changes.  I am unclear on the reason why this lab is requesting UTI testing.  Will not order and fax back without further and information were clinical indication.

## 2023-10-14 ENCOUNTER — Ambulatory Visit
Admission: RE | Admit: 2023-10-14 | Discharge: 2023-10-14 | Disposition: A | Discharge status: Home / Self Care | Source: Ambulatory Visit | Attending: Family Medicine | Admitting: Family Medicine

## 2023-10-14 DIAGNOSIS — Z1231 Encounter for screening mammogram for malignant neoplasm of breast: Secondary | ICD-10-CM

## 2023-10-14 NOTE — Telephone Encounter (Signed)
 Amye Baller calls nurse line again in regards to clinical order form.   Advised we spoke with patient on 5/28 and will not be ordering supplies.   Advised the patient can call us  directly for any medical symptoms including UTI.

## 2023-10-20 NOTE — Progress Notes (Unsigned)
 HISTORY AND PHYSICAL     CC:  dialysis access Requesting Provider:  Melodie Spry, MD  HPI: This is a 78 y.o. female here for evaluation of her hemodialysis access.  She is not yet on dialysis.   Pt has hx of CKD 5.  She states she has lost about 100 lbs on Ozempic .  She states she was sent over bc she had lost a lot of weight and her fistula needed to be checked.  She states she could see it working and could feel the thrill until about a week ago.    She denies any pain in her left hand.   She denies any pain in her feet with exception of some cramps at night.    Dialysis access history: -left 1st stage BVT 12/08/2022 Dr. Rosalva Comber -left 2nd stage BVT 02/02/2023 Dr. Rosalva Comber   The pt is right hand dominant.    Pt is not on dialysis.   Nephrologist:  Lydia Sams   The pt is on a statin for cholesterol management.  The pt is not on a daily aspirin .  Other AC:  none The pt is on diuretic, ARB, BB for hypertension.  The pt is is on medication for diabetes.   Tobacco hx:  former  Past Medical History:  Diagnosis Date   Acute kidney injury superimposed on chronic kidney disease (HCC) 04/28/2020   Anemia    Arthritis    Chronic kidney disease    Stage 4   Diabetes mellitus without complication (HCC)    type 2   GERD (gastroesophageal reflux disease)    Gout 2017   Hyperlipidemia    Hypertension    Hypertensive nephropathy 05/09/2021   Nephrotic range proteinuria 05/09/2021   Postoperative pain 04/29/2020   Rectal bleeding, History of 12/28/2019   Sciatica    Secondary hyperparathyroidism of renal origin (HCC) 05/09/2021   SI (sacroiliac) joint inflammation (HCC) 03/12/2019   UTI (urinary tract infection) 02/02/2019   Vitamin D  deficiency 2014    Past Surgical History:  Procedure Laterality Date   AV FISTULA PLACEMENT Left 12/08/2022   Procedure: CREATION OF LEFT ARM  BRACHIO BASILIC FISTULA   (FIRST STAGE);  Surgeon: Kayla Part, MD;  Location: [facility] OR;  Service: Vascular;   Laterality: Left;   BASCILIC VEIN TRANSPOSITION Left 02/02/2023   Procedure: LEFT ARM SECOND STAGE BASILIC VEIN TRANSPOSITION;  Surgeon: Kayla Part, MD;  Location: [facility] OR;  Service: Vascular;  Laterality: Left;   BREAST SURGERY Right 1961   Benign lump removed   CATARACT EXTRACTION, BILATERAL Bilateral 2017   CESAREAN SECTION  1985   CESAREAN SECTION  1990   CHOLECYSTECTOMY  1999   COLONOSCOPY     DILATION AND CURETTAGE OF UTERUS  2017   KNEE ARTHROSCOPY Right    VAGINAL SEPTOPLASTY  1966    Allergies  Allergen Reactions   Ciprofloxacin  Hcl Other (See Comments)    Burning at IV site   Metformin And Related Other (See Comments)    CKD    Ace Inhibitors Cough    Lisinopril   Amlodipine  Swelling    Lower leg swelling.    Baclofen  Nausea Only and Other (See Comments)    Disoriented, dizziness, weakness, fatigue.    Feraheme  Cael.Buttner ] Other (See Comments)    Chest pain    Lisinopril Palpitations    Current Outpatient Medications  Medication Sig Dispense Refill   ACCU-CHEK FASTCLIX LANCETS MISC TEST BLOOD SUGAR FOUR TIMES DAILY 408 each 11  acetaminophen  (TYLENOL  8 HOUR) 650 MG CR tablet Take 1 tablet (650 mg total) by mouth every 8 (eight) hours as needed for pain. 30 tablet 1   allopurinol  (ZYLOPRIM ) 100 MG tablet TAKE ONE TABLET (100MG  TOTAL) BY MOUTH TWICE A WEEK ON WEDNESDAY AND SUNDAY 12 tablet 0   atorvastatin  (LIPITOR) 40 MG tablet TAKE 1 TABLET BY MOUTH EVERY DAY 90 tablet 3   B-D ULTRAFINE III SHORT PEN 31G X 8 MM MISC Use to give insulin  twice a day and victoza  once a day. Dx code = E11.9 300 each 3   Blood Glucose Monitoring Suppl (ONETOUCH VERIO) w/Device KIT Use to check blood sugars 3x daily. E11.9 1 kit 0   carvedilol  (COREG ) 25 MG tablet Take 25 mg by mouth in the morning.     Cholecalciferol  (VITAMIN D ) 125 MCG (5000 UT) CAPS Take 5,000 Units by mouth daily.     Cyanocobalamin  (VITAMIN B-12) 2500 MCG SUBL Place 2,500 mcg under the tongue daily.      furosemide  (LASIX ) 40 MG tablet Take 40 mg by mouth in the morning.     losartan  (COZAAR ) 100 MG tablet TAKE 1 TABLET BY MOUTH EVERY DAY 90 tablet 0   mirabegron  ER (MYRBETRIQ ) 25 MG TB24 tablet Take 1 tablet (25 mg total) by mouth daily. 30 tablet 1   Multiple Vitamin (MULTIVITAMIN WITH MINERALS) TABS tablet Take 1 tablet by mouth 2 (two) times a week.     ONETOUCH ULTRA test strip USE TO TEST BLOOD SUGARS 3X DAILY. E11.9 100 strip 4   oxybutynin  (DITROPAN  XL) 15 MG 24 hr tablet Take 15 mg by mouth every morning.     polyethylene glycol powder (MIRALAX ) 17 GM/SCOOP powder Take 17 g by mouth daily as needed (constipation). 255 g 0   Semaglutide ,0.25 or 0.5MG /DOS, 2 MG/1.5ML SOPN Inject 0.25 mg into the skin once a week. 0.25 mg once weekly for 4 weeks then increase to 0.5 mg weekly for at least 4 weeks,max 1 mg 3 mL 0   spironolactone  (ALDACTONE ) 25 MG tablet TAKE ONE TABLET (25 MG TOTAL) BY MOUTH ONCE DAILY. (NEEDS APPT PRIOR TO NEXT REFILL) (Patient not taking: Reported on 09/20/2023) 90 tablet 0   traMADol  (ULTRAM ) 50 MG tablet Take 1 tablet (50 mg total) by mouth every 6 (six) hours as needed. 20 tablet 0   No current facility-administered medications for this visit.    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   Occupation: Retired  Tobacco Use   Smoking status: Former    Current packs/day: 0.00    Average packs/day: 1.5 packs/day for 30.0 years (45.0 ttl pk-yrs)    Types:  Cigarettes    Start date: 05/19/1967    Quit date: 05/18/1997    Years since quitting:  26.4    Passive exposure: Past   Smokeless tobacco: Never   Tobacco comments:    1.5 - 2 ppd for 30 years:   no plans to re-start  Vaping Use   Vaping status: Never Used  Substance and Sexual Activity   Alcohol use: No   Drug use: No   Sexual activity: Not Currently  Other Topics Concern   Not on file  Social History Narrative   Lives by herself in one level apt. Daughter lives in same complex. Has grab bars in bathroom. Smoke alarms, one rug under coffee table and at door.   No pets.   Recently moved from [place] in last two years.    Daughter helps with transportation.      Likes to eat meat, some vegetables, fruit. Drinks to drink sweet tea, some coffee. Get $15 in food stamps but runs out of food before end of month.   Enjoys shopping and fellowship. Writes poetry and plays. Word puzzles.   Social Drivers of Corporate investment banker Strain: Low Risk  (09/23/2023)   Overall Financial Resource Strain (CARDIA)    Difficulty of Paying Living Expenses: Not hard at all  Food Insecurity: No Food Insecurity (09/23/2023)   Hunger Vital Sign    Worried About Running Out of Food in the Last Year: Never true    Ran Out of Food in the Last Year: Never true  Transportation Needs: No Transportation Needs (09/23/2023)   PRAPARE - Administrator, Civil Service (Medical): No    Lack of Transportation (Non-Medical): No  Physical Activity: Insufficiently Active (09/23/2023)   Exercise Vital Sign    Days of Exercise per Week: 1 day    Minutes of Exercise per Session: 60 min  Stress: No Stress Concern Present (09/23/2023)   [employer] of Occupational Health - Occupational Stress Questionnaire    Feeling of Stress : Only a little  Social Connections: Moderately Integrated (09/23/2023)   Social Connection and Isolation Panel [NHANES]    Frequency of Communication with Friends and Family: More than three  times a week    Frequency of Social Gatherings with Friends and Family: More than three times a week    Attends Religious Services: More than 4 times per year    Active Member of [employer] or Organizations: Yes    Attends Banker Meetings: More than 4 times per year    Marital Status: Widowed  Intimate Partner Violence: Not At Risk (09/23/2023)   Humiliation, Afraid, Rape, and Kick questionnaire    Fear of Current or Ex-Partner: No    Emotionally Abused: No    Physically Abused: No    Sexually Abused: No     ROS: [x]  Positive   [ ]  Negative   [ ]  All sytems reviewed and are negative  Cardiac: []  chest pain/pressure []  SOB/DOE  Vascular: []  pain in legs while walking []  pain in feet when lying flat []  swelling in legs  Pulmonary: []  asthma []  wheezing  Neurologic: []  hx CVA/TIA  Hematologic: []  bleeding problems  GI []  GERD  GU: [x]  CKD/renal failure  []  HD---[]  M/W/F []  T/T/S  Psychiatric: []  hx of major depression  Integumentary: []  rashes []  ulcers  Constitutional: []  fever []  chills   PHYSICAL EXAMINATION:  Today's Vitals   10/21/23 0840  BP: (!) 156/82  Pulse: 61  Temp: 98.4 F (36.9 C)  TempSrc: Temporal  SpO2: 98%  Weight: 161 lb 6.4 oz (73.2 kg)  Height: 5\' 3"  (1.6 m)  PainSc: 0-No pain   Body mass index is 28.59 kg/m.    General:  WDWN female in NAD Gait: Not observed HENT: WNL Pulmonary: normal non-labored breathing  Cardiac: regular, without carotid bruits Skin: without rashes; incisions well healed.  Vascular Exam/Pulses:   Right Left  Radial 2+ (normal) 2+ (normal)  DP 2+ (normal) 2+ (normal)   Extremities:  no thrill in fistula; motor and sensory in tact left hand.  Musculoskeletal: no muscle wasting or atrophy  Neurologic: A&O X 3  Non-Invasive Vascular Imaging:   Dialysis duplex on 10/21/2023: Findings:  +--------------------+----------+-----------------+--------+  AVF                PSV (cm/s)Flow Vol  (mL/min)Comments  +--------------------+----------+-----------------+--------+  Native artery inflow    58                               +--------------------+----------+-----------------+--------+  AVF Anastomosis         72                               +--------------------+----------+-----------------+--------+     +------------+----------+-------------+----------+--------+  OUTFLOW VEINPSV (cm/s)Diameter (cm)Depth (cm)Describe  +------------+----------+-------------+----------+--------+  Prox UA         0         0.41               occluded  +------------+----------+-------------+----------+--------+  Mid UA          0         0.40               occluded  +------------+----------+-------------+----------+--------+  Dist UA         0         0.45               occluded  +------------+----------+-------------+----------+--------+  AC Fossa        0         0.50               occluded  +------------+----------+-------------+----------+--------+   Summary:  Occluded AVF    ASSESSMENT/PLAN: 78 y.o. female with CKD V here for evaluation of her hemodialysis access with hx of left basilic vein transposition created July 2024 by Dr. Rosalva Comber.   -pt seen to day to evaluate fistula and duplex reveals fistula is occluded.  Discussed with MD and given fistula has occluded without being used would not benefit from thrombectomy.  Discussed with pt that when she gets closer to needing dialysis, we will proceed most likely with left arm AVG.   She would like to know Dr. Rosalva Comber opinion as well.  I will talk with him next week and give the pt a call at that time.  She is in agreement with this plan.   -pt is not yet on dialysis  -discussed with pt that access does not last forever and will need intervention or even new access at some point.  -pt is right hand dominant    Maryanna Smart, [facility] [phone]  Clinic MD:   Edgardo Goodwill  on call MD

## 2023-10-21 ENCOUNTER — Ambulatory Visit (INDEPENDENT_AMBULATORY_CARE_PROVIDER_SITE_OTHER): Admitting: Physician Assistant

## 2023-10-21 ENCOUNTER — Ambulatory Visit (HOSPITAL_COMMUNITY)
Admission: RE | Admit: 2023-10-21 | Discharge: 2023-10-21 | Disposition: A | Discharge status: Home / Self Care | Source: Ambulatory Visit | Attending: Vascular Surgery | Admitting: Vascular Surgery

## 2023-10-21 ENCOUNTER — Other Ambulatory Visit: Payer: Self-pay | Admitting: Family Medicine

## 2023-10-21 VITALS — BP 156/82 | HR 61 | Temp 98.4°F | Ht 63.0 in | Wt 161.4 lb

## 2023-10-21 DIAGNOSIS — N185 Chronic kidney disease, stage 5: Secondary | ICD-10-CM

## 2023-10-26 ENCOUNTER — Inpatient Hospital Stay (HOSPITAL_COMMUNITY): Admission: RE | Admit: 2023-10-26 | Source: Ambulatory Visit

## 2023-10-27 ENCOUNTER — Telehealth: Payer: Self-pay | Admitting: Family Medicine

## 2023-10-27 NOTE — Telephone Encounter (Signed)
 Received another request for UTI lab requisition from [facility]. Patient should come in for an appointment for further evaluation instead per PCP. Please schedule this for her should she call in.

## 2023-11-01 ENCOUNTER — Other Ambulatory Visit: Payer: Self-pay

## 2023-11-01 MED ORDER — ONETOUCH ULTRA 2 W/DEVICE KIT: PACK | 0 refills | Status: DC

## 2023-11-01 MED ORDER — ONETOUCH ULTRASOFT LANCETS MISC: 12 refills | Status: AC

## 2023-11-01 MED ORDER — ONETOUCH ULTRA TEST VI STRP: ORAL_STRIP | 12 refills | Status: AC

## 2023-11-01 NOTE — Telephone Encounter (Signed)
 Patient calls line requesting new DM supplies.   She reports she tests BID.  She reports needing a new meter, testing strips and lancets.   She reports her current meter stopped working. She reports using OneTouch Ultra 2.   Please send in new supplies.

## 2023-11-03 DIAGNOSIS — N2581 Secondary hyperparathyroidism of renal origin: Secondary | ICD-10-CM | POA: Diagnosis not present

## 2023-11-03 DIAGNOSIS — N189 Chronic kidney disease, unspecified: Secondary | ICD-10-CM | POA: Diagnosis not present

## 2023-11-03 DIAGNOSIS — N185 Chronic kidney disease, stage 5: Secondary | ICD-10-CM | POA: Diagnosis not present

## 2023-11-03 DIAGNOSIS — I120 Hypertensive chronic kidney disease with stage 5 chronic kidney disease or end stage renal disease: Secondary | ICD-10-CM | POA: Diagnosis not present

## 2023-11-03 DIAGNOSIS — D631 Anemia in chronic kidney disease: Secondary | ICD-10-CM | POA: Diagnosis not present

## 2023-11-05 ENCOUNTER — Other Ambulatory Visit: Payer: Self-pay | Admitting: Family Medicine

## 2023-11-05 DIAGNOSIS — E1159 Type 2 diabetes mellitus with other circulatory complications: Secondary | ICD-10-CM

## 2023-11-23 ENCOUNTER — Ambulatory Visit (HOSPITAL_COMMUNITY)
Admission: RE | Admit: 2023-11-23 | Discharge: 2023-11-23 | Disposition: A | Discharge status: Home / Self Care | Source: Ambulatory Visit | Attending: Nephrology | Admitting: Nephrology

## 2023-11-23 VITALS — BP 147/76 | HR 64 | Temp 97.9°F | Resp 16

## 2023-11-23 DIAGNOSIS — D631 Anemia in chronic kidney disease: Secondary | ICD-10-CM | POA: Diagnosis not present

## 2023-11-23 DIAGNOSIS — N189 Chronic kidney disease, unspecified: Secondary | ICD-10-CM | POA: Diagnosis not present

## 2023-11-23 DIAGNOSIS — N185 Chronic kidney disease, stage 5: Secondary | ICD-10-CM | POA: Insufficient documentation

## 2023-11-23 LAB — POCT HEMOGLOBIN-HEMACUE: Hemoglobin: 8.2 g/dL — ABNORMAL LOW (ref 12.0–15.0)

## 2023-11-23 LAB — IRON AND TIBC
Iron: 65 ug/dL (ref 28–170)
Saturation Ratios: 32 % — ABNORMAL HIGH (ref 10.4–31.8)
TIBC: 204 ug/dL — ABNORMAL LOW (ref 250–450)
UIBC: 139 ug/dL

## 2023-11-23 LAB — FERRITIN: Ferritin: 706 ng/mL — ABNORMAL HIGH (ref 11–307)

## 2023-11-23 MED ORDER — EPOETIN ALFA-EPBX 40000 UNIT/ML IJ SOLN
INTRAMUSCULAR | Status: AC
  Filled 2023-11-23: qty 1

## 2023-11-23 MED ORDER — EPOETIN ALFA-EPBX 40000 UNIT/ML IJ SOLN
40000.0000 [IU] | Freq: Once | INTRAMUSCULAR | Status: AC
  Administered 2023-11-23: 40000 [IU] via SUBCUTANEOUS

## 2023-11-29 ENCOUNTER — Other Ambulatory Visit: Payer: Self-pay | Admitting: Family Medicine

## 2023-11-29 DIAGNOSIS — E1159 Type 2 diabetes mellitus with other circulatory complications: Secondary | ICD-10-CM

## 2023-12-17 ENCOUNTER — Other Ambulatory Visit: Payer: Self-pay | Admitting: Family Medicine

## 2023-12-20 MED ORDER — ACCU-CHEK GUIDE W/DEVICE KIT
PACK | 0 refills | Status: AC
Start: 2023-12-20 — End: ?

## 2023-12-21 ENCOUNTER — Ambulatory Visit (HOSPITAL_COMMUNITY)
Admission: RE | Admit: 2023-12-21 | Discharge: 2023-12-21 | Disposition: A | Discharge status: Home / Self Care | Source: Ambulatory Visit | Attending: Nephrology | Admitting: Nephrology

## 2023-12-21 VITALS — BP 166/81 | HR 66 | Temp 97.1°F | Resp 16

## 2023-12-21 DIAGNOSIS — N185 Chronic kidney disease, stage 5: Secondary | ICD-10-CM | POA: Diagnosis not present

## 2023-12-21 DIAGNOSIS — N189 Chronic kidney disease, unspecified: Secondary | ICD-10-CM | POA: Insufficient documentation

## 2023-12-21 DIAGNOSIS — D631 Anemia in chronic kidney disease: Secondary | ICD-10-CM | POA: Insufficient documentation

## 2023-12-21 LAB — IRON AND TIBC
Iron: 44 ug/dL (ref 28–170)
Saturation Ratios: 21 % (ref 10.4–31.8)
TIBC: 210 ug/dL — ABNORMAL LOW (ref 250–450)
UIBC: 166 ug/dL

## 2023-12-21 LAB — POCT HEMOGLOBIN-HEMACUE: Hemoglobin: 8.9 g/dL — ABNORMAL LOW (ref 12.0–15.0)

## 2023-12-21 LAB — FERRITIN: Ferritin: 534 ng/mL — ABNORMAL HIGH (ref 11–307)

## 2023-12-21 MED ORDER — EPOETIN ALFA-EPBX 40000 UNIT/ML IJ SOLN
40000.0000 [IU] | Freq: Once | INTRAMUSCULAR | Status: AC
  Administered 2023-12-21: 40000 [IU] via SUBCUTANEOUS

## 2023-12-21 MED ORDER — EPOETIN ALFA-EPBX 40000 UNIT/ML IJ SOLN
INTRAMUSCULAR | Status: AC
  Filled 2023-12-21: qty 1

## 2024-01-04 DIAGNOSIS — N2581 Secondary hyperparathyroidism of renal origin: Secondary | ICD-10-CM | POA: Diagnosis not present

## 2024-01-04 DIAGNOSIS — D631 Anemia in chronic kidney disease: Secondary | ICD-10-CM | POA: Diagnosis not present

## 2024-01-04 DIAGNOSIS — I120 Hypertensive chronic kidney disease with stage 5 chronic kidney disease or end stage renal disease: Secondary | ICD-10-CM | POA: Diagnosis not present

## 2024-01-04 DIAGNOSIS — N185 Chronic kidney disease, stage 5: Secondary | ICD-10-CM | POA: Diagnosis not present

## 2024-01-14 ENCOUNTER — Other Ambulatory Visit: Payer: Self-pay | Admitting: Family Medicine

## 2024-01-14 DIAGNOSIS — E1159 Type 2 diabetes mellitus with other circulatory complications: Secondary | ICD-10-CM

## 2024-01-18 ENCOUNTER — Encounter (HOSPITAL_COMMUNITY)
Admission: RE | Admit: 2024-01-18 | Discharge: 2024-01-18 | Disposition: A | Discharge status: Home / Self Care | Source: Ambulatory Visit | Attending: Nephrology | Admitting: Nephrology

## 2024-01-18 VITALS — BP 173/75 | HR 58 | Temp 97.2°F | Resp 16

## 2024-01-18 DIAGNOSIS — D631 Anemia in chronic kidney disease: Secondary | ICD-10-CM | POA: Insufficient documentation

## 2024-01-18 DIAGNOSIS — N185 Chronic kidney disease, stage 5: Secondary | ICD-10-CM | POA: Insufficient documentation

## 2024-01-18 LAB — IRON AND TIBC
Iron: 51 ug/dL (ref 28–170)
Saturation Ratios: 23 % (ref 10.4–31.8)
TIBC: 224 ug/dL — ABNORMAL LOW (ref 250–450)
UIBC: 173 ug/dL

## 2024-01-18 LAB — POCT HEMOGLOBIN-HEMACUE: Hemoglobin: 9.3 g/dL — ABNORMAL LOW (ref 12.0–15.0)

## 2024-01-18 LAB — FERRITIN: Ferritin: 615 ng/mL — ABNORMAL HIGH (ref 11–307)

## 2024-01-18 MED ORDER — EPOETIN ALFA-EPBX 40000 UNIT/ML IJ SOLN
INTRAMUSCULAR | Status: AC
  Filled 2024-01-18: qty 1

## 2024-01-18 MED ORDER — EPOETIN ALFA-EPBX 40000 UNIT/ML IJ SOLN
40000.0000 [IU] | Freq: Once | INTRAMUSCULAR | Status: AC
  Administered 2024-01-18: 40000 [IU] via SUBCUTANEOUS

## 2024-01-21 ENCOUNTER — Telehealth: Payer: Self-pay | Admitting: Pharmacy Technician

## 2024-01-21 NOTE — Telephone Encounter (Signed)
 Auth Submission: NO AUTH NEEDED Site of care: Site of care: [facility] Payer: [employer] DUAL Medication & CPT/J Code(s) submitted: RETACRIT  Q5106 Diagnosis Code: N18.5 Route of submission (phone, fax, portal):  Phone # Fax # Auth type: Buy/Bill HB Units/visits requested: 40,000 U Q28D Reference number:  Approval from: 11/10/23 to 05/17/24

## 2024-01-27 ENCOUNTER — Other Ambulatory Visit (HOSPITAL_COMMUNITY): Payer: Self-pay | Admitting: Nephrology

## 2024-01-27 DIAGNOSIS — N185 Chronic kidney disease, stage 5: Secondary | ICD-10-CM | POA: Insufficient documentation

## 2024-02-02 ENCOUNTER — Other Ambulatory Visit: Payer: Self-pay

## 2024-02-02 DIAGNOSIS — N185 Chronic kidney disease, stage 5: Secondary | ICD-10-CM

## 2024-02-04 ENCOUNTER — Other Ambulatory Visit: Payer: Self-pay | Admitting: Family Medicine

## 2024-02-04 DIAGNOSIS — E1159 Type 2 diabetes mellitus with other circulatory complications: Secondary | ICD-10-CM

## 2024-02-15 ENCOUNTER — Encounter (HOSPITAL_COMMUNITY)
Admission: RE | Admit: 2024-02-15 | Discharge: 2024-02-15 | Disposition: A | Discharge status: Home / Self Care | Source: Ambulatory Visit | Attending: Nephrology | Admitting: Nephrology

## 2024-02-15 VITALS — BP 175/59 | HR 55 | Temp 97.6°F | Resp 16

## 2024-02-15 DIAGNOSIS — D631 Anemia in chronic kidney disease: Secondary | ICD-10-CM

## 2024-02-15 DIAGNOSIS — N185 Chronic kidney disease, stage 5: Secondary | ICD-10-CM | POA: Diagnosis not present

## 2024-02-15 LAB — POCT HEMOGLOBIN-HEMACUE: Hemoglobin: 9.4 g/dL — ABNORMAL LOW (ref 12.0–15.0)

## 2024-02-15 MED ORDER — EPOETIN ALFA-EPBX 40000 UNIT/ML IJ SOLN
40000.0000 [IU] | Freq: Once | INTRAMUSCULAR | Status: AC
  Administered 2024-02-15: 40000 [IU] via SUBCUTANEOUS

## 2024-02-15 MED ORDER — EPOETIN ALFA-EPBX 40000 UNIT/ML IJ SOLN
INTRAMUSCULAR | Status: AC
  Filled 2024-02-15: qty 1

## 2024-02-16 ENCOUNTER — Telehealth: Admitting: Nurse Practitioner

## 2024-02-16 ENCOUNTER — Telehealth

## 2024-02-16 DIAGNOSIS — R3989 Other symptoms and signs involving the genitourinary system: Secondary | ICD-10-CM | POA: Diagnosis not present

## 2024-02-16 MED ORDER — AMOXICILLIN-POT CLAVULANATE 250-125 MG PO TABS: 1.0000 | ORAL_TABLET | Freq: Two times a day (BID) | ORAL | 0 refills | Status: AC

## 2024-02-16 NOTE — Progress Notes (Signed)
 Acute Video Visit    Virtual Visit Consent:   Michelle Abbott, you are scheduled for a virtual visit with a [facility] provider today.     Just as with appointments in the office, your consent must be obtained to participate.  Your consent will be active for this visit and any virtual visit you may have with one of our providers in the next 365 days.     If you have a MyChart account, a copy of this consent can be sent to you electronically.  All virtual visits are billed to your insurance company just like a traditional visit in the office.    If the connection with a video visit is poor, the visit may have to be switched to a telephone visit.  With either a video or telephone visit, we are not always able to ensure that we have a secure connection.     I need to obtain your verbal consent now.   Are you willing to proceed with your visit today?    Michelle Abbott has provided verbal consent on 02/16/2024 for a virtual visit (video or telephone).   Michelle Kitty, FNP  Date: 02/16/2024 4:05 PM  Subjective:     Patient ID: Michelle Abbott, female    DOB: [birth year], 78 y.o.   MRN: [id number]  ILauraine Abbott, connected with  Michelle Abbott  ([id number], 09/10/1945) on 02/16/24 at  3:20 PM EDT by a video-enabled telemedicine application and verified that I am speaking with the correct person using two identifiers.   Location: Patient: Home  Provider: Virtual Visit Location Provider: Home Office   I discussed the limitations of evaluation and management by telemedicine and the availability of in person appointments. The patient expressed understanding and agreed to proceed.    No chief complaint on file.   HPI Michelle Abbott is a 78 y.o. who identifies as a female who was assigned female at birth, and is being seen today for symptoms consistent with a UTI. Her symptoms today include pain with urination, she has noted that her urine  is cloudy. She notes an increase in pressure with urination. Denies fever/back pain or nausea   She was seen yesterday in person for her infusion, and tried to make an appointment with her PCP but the office closed at noon.   Patient most recently was treated for a UTI in February with Augmentin  at a renal dosage.  Culture was performed at that time without evidence of any antibiotic resistance   She will be following up with Nephrology on 02/29/24  She is unable to drive at this time, but feels she will be able to coordinate a ride for her appointment later this month.        Objective:      Physical Exam Constitutional:      Appearance: Normal appearance.  Pulmonary:     Effort: Pulmonary effort is normal.  Neurological:     Mental Status: She is alert and oriented to person, place, and time.  Psychiatric:        Mood and Affect: Mood normal.          Assessment & Plan:   Will model well tolerated plan from February with strict guidelines to be seen in person for new or worsening symptoms    1. Suspected UTI (Primary) - amoxicillin -clavulanate (AUGMENTIN ) 250-125 MG tablet; Take 1 tablet by mouth 2 (two) times daily for 5 days.  Dispense: 10 tablet;  Refill: 0     Follow Up Instructions: I discussed the assessment and treatment plan with the patient. The patient was provided an opportunity to ask questions and all were answered. The patient agreed with the plan and demonstrated an understanding of the instructions.  A copy of instructions were sent to the patient via MyChart unless otherwise noted below.    The patient was advised to call back or seek an in-person evaluation if the symptoms worsen or if the condition fails to improve as anticipated.    Michelle Kitty, FNP  **Disclaimer: This note may have been dictated with voice recognition software. Similar sounding words can inadvertently be transcribed and this note may contain transcription errors which may not  have been corrected upon publication of note.**

## 2024-02-28 ENCOUNTER — Other Ambulatory Visit: Payer: Self-pay | Admitting: Family Medicine

## 2024-02-28 DIAGNOSIS — I152 Hypertension secondary to endocrine disorders: Secondary | ICD-10-CM

## 2024-02-29 DIAGNOSIS — I120 Hypertensive chronic kidney disease with stage 5 chronic kidney disease or end stage renal disease: Secondary | ICD-10-CM | POA: Diagnosis not present

## 2024-02-29 DIAGNOSIS — I129 Hypertensive chronic kidney disease with stage 1 through stage 4 chronic kidney disease, or unspecified chronic kidney disease: Secondary | ICD-10-CM | POA: Diagnosis not present

## 2024-02-29 DIAGNOSIS — N2581 Secondary hyperparathyroidism of renal origin: Secondary | ICD-10-CM | POA: Diagnosis not present

## 2024-02-29 DIAGNOSIS — N185 Chronic kidney disease, stage 5: Secondary | ICD-10-CM | POA: Diagnosis not present

## 2024-02-29 DIAGNOSIS — Z23 Encounter for immunization: Secondary | ICD-10-CM | POA: Diagnosis not present

## 2024-02-29 DIAGNOSIS — D631 Anemia in chronic kidney disease: Secondary | ICD-10-CM | POA: Diagnosis not present

## 2024-03-01 ENCOUNTER — Other Ambulatory Visit: Payer: Self-pay

## 2024-03-01 DIAGNOSIS — E782 Mixed hyperlipidemia: Secondary | ICD-10-CM

## 2024-03-02 ENCOUNTER — Ambulatory Visit (INDEPENDENT_AMBULATORY_CARE_PROVIDER_SITE_OTHER): Admitting: Vascular Surgery

## 2024-03-02 ENCOUNTER — Ambulatory Visit (HOSPITAL_COMMUNITY)
Admission: RE | Admit: 2024-03-02 | Discharge: 2024-03-02 | Disposition: A | Discharge status: Home / Self Care | Source: Ambulatory Visit | Attending: Vascular Surgery | Admitting: Vascular Surgery

## 2024-03-02 ENCOUNTER — Encounter: Payer: Self-pay | Admitting: Vascular Surgery

## 2024-03-02 ENCOUNTER — Ambulatory Visit (HOSPITAL_BASED_OUTPATIENT_CLINIC_OR_DEPARTMENT_OTHER)
Admission: RE | Admit: 2024-03-02 | Discharge: 2024-03-02 | Disposition: A | Discharge status: Home / Self Care | Source: Ambulatory Visit | Attending: Vascular Surgery | Admitting: Vascular Surgery

## 2024-03-02 VITALS — BP 162/90 | HR 74 | Temp 98.0°F | Resp 20 | Ht 63.0 in | Wt 162.1 lb

## 2024-03-02 DIAGNOSIS — N185 Chronic kidney disease, stage 5: Secondary | ICD-10-CM

## 2024-03-02 MED ORDER — ATORVASTATIN CALCIUM 40 MG PO TABS: 40.0000 mg | ORAL_TABLET | Freq: Every day | ORAL | 3 refills | Status: AC

## 2024-03-02 NOTE — Progress Notes (Signed)
 Office Note     CC:  [id number] Requesting Provider:  Tobie Gordy POUR, MD  HPI: Michelle Abbott is a Right handed 79 y.o. (1945/08/01) female with kidney disease who presents at the request of Tobie Gordy POUR, MD for permanent HD access. The patient has had prior access procedures -left arm brachiobasilic fistula creation and subsequent transposition 2024.  No previous tunneled lines.  Not on dialysis. Of note, the patient is lost to 100 pounds on Ozempic .  On exam today, Michelle Abbott was doing well.  She has yet to start dialysis.  She had an appointment with nephrology 2 days ago, and there are no plans to start dialysis currently. She continues to be active, spending most of her time at [employer], where she is a member.   Past Medical History:  Diagnosis Date   Acute kidney injury superimposed on chronic kidney disease 04/28/2020   Anemia    Arthritis    Chronic kidney disease    Stage 4   Diabetes mellitus without complication (HCC)    type 2   GERD (gastroesophageal reflux disease)    Gout 2017   Hyperlipidemia    Hypertension    Hypertensive nephropathy 05/09/2021   Nephrotic range proteinuria 05/09/2021   Postoperative pain 04/29/2020   Rectal bleeding, History of 12/28/2019   Sciatica    Secondary hyperparathyroidism of renal origin 05/09/2021   SI (sacroiliac) joint inflammation 03/12/2019   UTI (urinary tract infection) 02/02/2019   Vitamin D  deficiency 2014    Past Surgical History:  Procedure Laterality Date   AV FISTULA PLACEMENT Left 12/08/2022   Procedure: CREATION OF LEFT ARM  BRACHIO BASILIC FISTULA   (FIRST STAGE);  Surgeon: Lanis Fonda BRAVO, MD;  Location: [facility] OR;  Service: Vascular;  Laterality: Left;   BASCILIC VEIN TRANSPOSITION Left 02/02/2023   Procedure: LEFT ARM SECOND STAGE BASILIC VEIN TRANSPOSITION;  Surgeon: Lanis Fonda BRAVO, MD;  Location: [facility] OR;  Service: Vascular;  Laterality: Left;   BREAST SURGERY Right 1961   Benign lump removed    CATARACT EXTRACTION, BILATERAL Bilateral 2017   CESAREAN SECTION  1985   CESAREAN SECTION  1990   CHOLECYSTECTOMY  1999   COLONOSCOPY     DILATION AND CURETTAGE OF UTERUS  2017   KNEE ARTHROSCOPY Right    VAGINAL SEPTOPLASTY  1966    Social History   Socioeconomic History   Marital status: Widowed    Spouse name: Not on file   Number of children: 2   Years of education: 61   Highest education level: Some college, no degree  Occupational History   Occupation: Secondary school teacher     Comment: Developmentally disabled persons   Occupation: Retired  Tobacco Use   Smoking status: Former    Current packs/day: 0.00    Average packs/day: 1.5 packs/day for 30.0 years (45.0 ttl pk-yrs)    Types: Cigarettes    Start date: 05/19/1967    Quit date: 05/18/1997    Years since quitting: 26.8    Passive exposure: Past   Smokeless tobacco: Never   Tobacco comments:    1.5 - 2 ppd for 30 years:   no plans to re-start  Vaping Use   Vaping status: Never Used  Substance and Sexual Activity   Alcohol use: No   Drug use: No   Sexual activity: Not Currently  Other Topics Concern   Not on file  Social History Narrative   Lives by herself in one level apt. Daughter lives in  same complex. Has grab bars in bathroom. Smoke alarms, one rug under coffee table and at door.   No pets.   Recently moved from [place] in last two years.    Daughter helps with transportation.      Likes to eat meat, some vegetables, fruit. Drinks to drink sweet tea, some coffee. Get $15 in food stamps but runs out of food before end of month.   Enjoys shopping and fellowship. Writes poetry and plays. Word puzzles.   Social Drivers of Corporate investment banker Strain: Low Risk  (09/23/2023)   Overall Financial Resource Strain (CARDIA)    Difficulty of Paying Living Expenses: Not hard at all  Food Insecurity: No Food Insecurity (09/23/2023)   Hunger Vital Sign    Worried About Running Out of Food in the Last Year: Never true    Ran  Out of Food in the Last Year: Never true  Transportation Needs: No Transportation Needs (09/23/2023)   PRAPARE - Administrator, Civil Service (Medical): No    Lack of Transportation (Non-Medical): No  Physical Activity: Insufficiently Active (09/23/2023)   Exercise Vital Sign    Days of Exercise per Week: 1 day    Minutes of Exercise per Session: 60 min  Stress: No Stress Concern Present (09/23/2023)   [employer] of Occupational Health - Occupational Stress Questionnaire    Feeling of Stress : Only a little  Social Connections: Moderately Integrated (09/23/2023)   Social Connection and Isolation Panel    Frequency of Communication with Friends and Family: More than three times a week    Frequency of Social Gatherings with Friends and Family: More than three times a week    Attends Religious Services: More than 4 times per year    Active Member of [employer] or Organizations: Yes    Attends Banker Meetings: More than 4 times per year    Marital Status: Widowed  Intimate Partner Violence: Not At Risk (09/23/2023)   Humiliation, Afraid, Rape, and Kick questionnaire    Fear of Current or Ex-Partner: No    Emotionally Abused: No    Physically Abused: No    Sexually Abused: No   Family History  Problem Relation Age of Onset   Cancer Mother        ovarian and pancreatic   Diabetes Mother    Stroke Mother    COPD Father    Arthritis Father    Cancer Brother        Lung   COPD Brother    Breast cancer Neg Hx    BRCA 1/2 Neg Hx     Current Outpatient Medications  Medication Sig Dispense Refill   acetaminophen  (TYLENOL  8 HOUR) 650 MG CR tablet Take 1 tablet (650 mg total) by mouth every 8 (eight) hours as needed for pain. 30 tablet 1   allopurinol  (ZYLOPRIM ) 100 MG tablet TAKE ONE TABLET (100MG  TOTAL) BY MOUTH TWICE A WEEK ON WEDNESDAY AND SUNDAY 12 tablet 0   atorvastatin  (LIPITOR) 40 MG tablet Take 1 tablet (40 mg total) by mouth daily. 90 tablet 3   B-D  ULTRAFINE III SHORT PEN 31G X 8 MM MISC Use to give insulin  twice a day and victoza  once a day. Dx code = E11.9 300 each 3   Blood Glucose Monitoring Suppl (ACCU-CHEK GUIDE) w/Device KIT Use to check blood sugar 3 times per day 1 kit 0   carvedilol  (COREG ) 25 MG tablet Take 25 mg by mouth  in the morning.     Cholecalciferol  (VITAMIN D ) 125 MCG (5000 UT) CAPS Take 5,000 Units by mouth daily.     Cyanocobalamin  (VITAMIN B-12) 2500 MCG SUBL Place 2,500 mcg under the tongue daily.     furosemide  (LASIX ) 40 MG tablet Take 40 mg by mouth in the morning.     glucose blood (ONETOUCH ULTRA TEST) test strip Use to check blood sugar BID. E11.9 100 each 12   Lancets (ONETOUCH ULTRASOFT) lancets Use to check blood sugar BID. E11.9 100 each 12   losartan  (COZAAR ) 100 MG tablet TAKE 1 TABLET BY MOUTH EVERY DAY 90 tablet 0   mirabegron  ER (MYRBETRIQ ) 25 MG TB24 tablet Take 1 tablet (25 mg total) by mouth daily. 30 tablet 1   Multiple Vitamin (MULTIVITAMIN WITH MINERALS) TABS tablet Take 1 tablet by mouth 2 (two) times a week.     oxybutynin  (DITROPAN  XL) 15 MG 24 hr tablet Take 15 mg by mouth every morning.     polyethylene glycol powder (MIRALAX ) 17 GM/SCOOP powder Take 17 g by mouth daily as needed (constipation). 255 g 0   Semaglutide ,0.25 or 0.5MG /DOS, 2 MG/1.5ML SOPN Inject 0.25 mg into the skin once a week. 0.25 mg once weekly for 4 weeks then increase to 0.5 mg weekly for at least 4 weeks,max 1 mg 3 mL 0   spironolactone  (ALDACTONE ) 25 MG tablet TAKE ONE TABLET (25 MG TOTAL) BY MOUTH DAILY PATIENT NEEDS APPOINTMENT BEFORE NEXT REFILL 30 tablet 0   No current facility-administered medications for this visit.    Allergies  Allergen Reactions   Ciprofloxacin  Hcl Other (See Comments)    Burning at IV site   Metformin And Related Other (See Comments)    CKD    Ace Inhibitors Cough    Lisinopril   Amlodipine  Swelling    Lower leg swelling.    Baclofen  Nausea Only and Other (See Comments)     Disoriented, dizziness, weakness, fatigue.    Feraheme  [Ferumoxytol ] Other (See Comments)    Chest pain    Lisinopril Palpitations     REVIEW OF SYSTEMS:  [X]  denotes positive finding, [ ]  denotes negative finding Cardiac  Comments:  Chest pain or chest pressure:    Shortness of breath upon exertion:    Short of breath when lying flat:    Irregular heart rhythm:        Vascular    Pain in calf, thigh, or hip brought on by ambulation:    Pain in feet at night that wakes you up from your sleep:     Blood clot in your veins:    Leg swelling:         Pulmonary    Oxygen at home:    Productive cough:     Wheezing:         Neurologic    Sudden weakness in arms or legs:     Sudden numbness in arms or legs:     Sudden onset of difficulty speaking or slurred speech:    Temporary loss of vision in one eye:     Problems with dizziness:         Gastrointestinal    Blood in stool:     Vomited blood:         Genitourinary    Burning when urinating:     Blood in urine:        Psychiatric    Major depression:         Hematologic  Bleeding problems:    Problems with blood clotting too easily:        Skin    Rashes or ulcers:        Constitutional    Fever or chills:      PHYSICAL EXAMINATION:  There were no vitals filed for this visit.  General:  WDWN in NAD; vital signs documented above Gait: Not observed HENT: WNL, normocephalic Pulmonary: normal non-labored breathing , without Rales, rhonchi,  wheezing Cardiac: regular HR Abdomen: soft, NT, no masses Skin: without rashes Vascular Exam/Pulses:  Right Left  Radial 2+ (normal) 2+ (normal)                       Extremities: without ischemic changes, without Gangrene , without cellulitis; without open wounds;  Musculoskeletal: no muscle wasting or atrophy  Neurologic: A&O X 3;  No focal weakness or paresthesias are detected Psychiatric:  The pt has Normal affect.   Non-Invasive Vascular Imaging:   See  studies    ASSESSMENT/PLAN:  Hadlie Gipson is a 78 y.o. female who presents with chronic kidney disease stage 5  Based on vein mapping and examination, Michelle Abbott will require AV graft when she is closer to dialysis.  At this time, there are no plans to start dialysis in the coming months.. I had an extensive discussion with this patient in regards to the nature of access surgery, including risk, benefits, and alternatives.   The patient is aware that the risks of access surgery include but are not limited to: bleeding, infection, steal syndrome, nerve damage, ischemic monomelic neuropathy, failure of access to mature, complications related to venous hypertension, and possible need for additional access procedures in the future. We discussed that I am available, and happy to perform her left upper extremity AV graft whenever she is closer to needing permanent access.  Fonda FORBES Rim, MD [facility] [phone]

## 2024-03-07 ENCOUNTER — Other Ambulatory Visit: Payer: Self-pay | Admitting: Family Medicine

## 2024-03-07 ENCOUNTER — Telehealth (HOSPITAL_COMMUNITY): Payer: Self-pay | Admitting: *Deleted

## 2024-03-07 DIAGNOSIS — I152 Hypertension secondary to endocrine disorders: Secondary | ICD-10-CM

## 2024-03-07 NOTE — Telephone Encounter (Signed)
 Received fax from Dr Gordy Blanch requesting to proceed with placement of AVG due to concern of needing dialysis in the near future.  Office visit with JER 03/02/2024.  Will notify Alan and scan request into media.

## 2024-03-08 ENCOUNTER — Telehealth: Payer: Self-pay

## 2024-03-08 ENCOUNTER — Other Ambulatory Visit: Payer: Self-pay

## 2024-03-08 DIAGNOSIS — N186 End stage renal disease: Secondary | ICD-10-CM

## 2024-03-08 NOTE — Telephone Encounter (Signed)
 Attempted to call for surgery scheduling. Mailbox is full.

## 2024-03-08 NOTE — Telephone Encounter (Signed)
 Spoke to patient's daughter to inform that her mom's telephone mailbox is full. Daughter will have patient call our office.

## 2024-03-08 NOTE — Telephone Encounter (Signed)
 Attempted to call for surgery scheduling. LVM

## 2024-03-10 DIAGNOSIS — N185 Chronic kidney disease, stage 5: Secondary | ICD-10-CM | POA: Diagnosis not present

## 2024-03-10 DIAGNOSIS — N2581 Secondary hyperparathyroidism of renal origin: Secondary | ICD-10-CM | POA: Diagnosis not present

## 2024-03-10 DIAGNOSIS — R300 Dysuria: Secondary | ICD-10-CM | POA: Diagnosis not present

## 2024-03-14 ENCOUNTER — Ambulatory Visit (HOSPITAL_COMMUNITY)
Admission: RE | Admit: 2024-03-14 | Discharge: 2024-03-14 | Disposition: A | Discharge status: Home / Self Care | Source: Ambulatory Visit | Attending: Nephrology | Admitting: Nephrology

## 2024-03-14 VITALS — BP 179/79 | HR 78 | Temp 97.4°F | Resp 18

## 2024-03-14 DIAGNOSIS — N185 Chronic kidney disease, stage 5: Secondary | ICD-10-CM | POA: Insufficient documentation

## 2024-03-14 DIAGNOSIS — D631 Anemia in chronic kidney disease: Secondary | ICD-10-CM | POA: Diagnosis not present

## 2024-03-14 LAB — IRON AND TIBC
Iron: 54 ug/dL (ref 28–170)
Saturation Ratios: 27 % (ref 10.4–31.8)
TIBC: 203 ug/dL — ABNORMAL LOW (ref 250–450)
UIBC: 149 ug/dL

## 2024-03-14 LAB — FERRITIN: Ferritin: 562 ng/mL — ABNORMAL HIGH (ref 11–307)

## 2024-03-14 LAB — POCT HEMOGLOBIN-HEMACUE: Hemoglobin: 9.5 g/dL — ABNORMAL LOW (ref 12.0–15.0)

## 2024-03-14 MED ORDER — EPOETIN ALFA-EPBX 40000 UNIT/ML IJ SOLN
40000.0000 [IU] | Freq: Once | INTRAMUSCULAR | Status: AC
  Administered 2024-03-14: 40000 [IU] via SUBCUTANEOUS

## 2024-03-14 MED ORDER — EPOETIN ALFA-EPBX 40000 UNIT/ML IJ SOLN
INTRAMUSCULAR | Status: AC
  Filled 2024-03-14: qty 1

## 2024-03-24 ENCOUNTER — Other Ambulatory Visit: Payer: Self-pay

## 2024-03-24 ENCOUNTER — Encounter (HOSPITAL_COMMUNITY): Payer: Self-pay | Admitting: Vascular Surgery

## 2024-03-24 NOTE — Progress Notes (Signed)
 SDW call  Patient was given pre-op  instructions over the phone. Patient verbalized understanding of instructions provided. She denies any SOB, fever, cough or CP     PCP - Dr. Ozell Blake Cardiologist -  Pulmonary:    PPM/ICD - denies Device Orders - na Rep Notified - na   Chest x-ray - na EKG -  DOS, 03/27/2024 Stress Test - ECHO - 11/17/2016 Cardiac Cath -   Sleep Study/sleep apnea/CPAP: denies  Type II diabetic. A1C 5.4 on 06/26/2023.  States she has lost her monitor and has not checked her blood sugars in over 2 weeks Fasting Blood sugar range: na How often check sugars:na  GLP1: Ozempic , states last dose 03/17/2024   Blood Thinner Instructions: denies Aspirin  Instructions:denies   ERAS Protcol - NPO  Anesthesia review: No  Your procedure is scheduled on Monday March 27, 2024  Report to [facility] Main Entrance A at  1050  A.M., then check in with the Admitting office.  Call this number if you have problems the morning of surgery:  [phone]   If you have any questions prior to your surgery date call [phone]: Open Monday-Friday 8am-4pm If you experience any cold or flu symptoms such as cough, fever, chills, shortness of breath, etc. between now and your scheduled surgery, please notify us  at the above number    Remember:  Do not eat or drink after midnight the night before your surgery  Take these medicines the morning of surgery with A SIP OF WATER:  Augmentin , atorvastatin , carvedilol , sodium bicarb  As needed: Tylenol , colchicine   As of today, STOP taking any Aspirin  (unless otherwise instructed by your surgeon) Aleve, Naproxen, Ibuprofen, Motrin, Advil, Goody's, BC's, all herbal medications, fish oil, and all vitamins.

## 2024-03-27 ENCOUNTER — Encounter (HOSPITAL_COMMUNITY): Payer: Self-pay | Admitting: Vascular Surgery

## 2024-03-27 ENCOUNTER — Other Ambulatory Visit: Payer: Self-pay

## 2024-03-27 ENCOUNTER — Ambulatory Visit (HOSPITAL_COMMUNITY): Admitting: Anesthesiology

## 2024-03-27 ENCOUNTER — Ambulatory Visit (HOSPITAL_COMMUNITY)
Admission: RE | Admit: 2024-03-27 | Discharge: 2024-03-27 | Disposition: A | Discharge status: Home / Self Care | Attending: Vascular Surgery | Admitting: Vascular Surgery

## 2024-03-27 ENCOUNTER — Encounter (HOSPITAL_COMMUNITY)
Admission: RE | Disposition: A | Discharge status: Home / Self Care | Payer: Self-pay | Source: Home / Self Care | Attending: Vascular Surgery

## 2024-03-27 DIAGNOSIS — D631 Anemia in chronic kidney disease: Secondary | ICD-10-CM | POA: Diagnosis not present

## 2024-03-27 DIAGNOSIS — I120 Hypertensive chronic kidney disease with stage 5 chronic kidney disease or end stage renal disease: Secondary | ICD-10-CM | POA: Insufficient documentation

## 2024-03-27 DIAGNOSIS — Z87891 Personal history of nicotine dependence: Secondary | ICD-10-CM

## 2024-03-27 DIAGNOSIS — T82868A Thrombosis of vascular prosthetic devices, implants and grafts, initial encounter: Secondary | ICD-10-CM | POA: Insufficient documentation

## 2024-03-27 DIAGNOSIS — N186 End stage renal disease: Secondary | ICD-10-CM | POA: Diagnosis not present

## 2024-03-27 DIAGNOSIS — Y832 Surgical operation with anastomosis, bypass or graft as the cause of abnormal reaction of the patient, or of later complication, without mention of misadventure at the time of the procedure: Secondary | ICD-10-CM | POA: Diagnosis not present

## 2024-03-27 DIAGNOSIS — Z7985 Long-term (current) use of injectable non-insulin antidiabetic drugs: Secondary | ICD-10-CM | POA: Insufficient documentation

## 2024-03-27 DIAGNOSIS — E1122 Type 2 diabetes mellitus with diabetic chronic kidney disease: Secondary | ICD-10-CM | POA: Diagnosis not present

## 2024-03-27 DIAGNOSIS — N185 Chronic kidney disease, stage 5: Secondary | ICD-10-CM | POA: Diagnosis not present

## 2024-03-27 DIAGNOSIS — Z79899 Other long term (current) drug therapy: Secondary | ICD-10-CM | POA: Insufficient documentation

## 2024-03-27 HISTORY — PX: INSERTION OF ARTERIOVENOUS (AV) ARTEGRAFT ARM: SHX6779

## 2024-03-27 LAB — POCT I-STAT, CHEM 8
BUN: 76 mg/dL — ABNORMAL HIGH (ref 8–23)
Calcium, Ion: 1.22 mmol/L (ref 1.15–1.40)
Chloride: 111 mmol/L (ref 98–111)
Creatinine, Ser: 6.8 mg/dL — ABNORMAL HIGH (ref 0.44–1.00)
Glucose, Bld: 89 mg/dL (ref 70–99)
HCT: 33 % — ABNORMAL LOW (ref 36.0–46.0)
Hemoglobin: 11.2 g/dL — ABNORMAL LOW (ref 12.0–15.0)
Potassium: 5.1 mmol/L (ref 3.5–5.1)
Sodium: 142 mmol/L (ref 135–145)
TCO2: 22 mmol/L (ref 22–32)

## 2024-03-27 LAB — GLUCOSE, CAPILLARY
Glucose-Capillary: 85 mg/dL (ref 70–99)
Glucose-Capillary: 73 mg/dL (ref 70–99)
Glucose-Capillary: 93 mg/dL (ref 70–99)

## 2024-03-27 SURGERY — INSERTION, GRAFT, ARTERIOVENOUS, UPPER EXTREMITY
Anesthesia: General | Laterality: Left

## 2024-03-27 MED ORDER — ONDANSETRON HCL 4 MG/2ML IJ SOLN
INTRAMUSCULAR | Status: DC | PRN
  Administered 2024-03-27: 4 mg via INTRAVENOUS

## 2024-03-27 MED ORDER — HEPARIN SODIUM (PORCINE) 1000 UNIT/ML IJ SOLN
INTRAMUSCULAR | Status: DC | PRN
  Administered 2024-03-27: 3000 [IU] via INTRAVENOUS

## 2024-03-27 MED ORDER — EPHEDRINE SULFATE-NACL 50-0.9 MG/10ML-% IV SOSY
PREFILLED_SYRINGE | INTRAVENOUS | Status: DC | PRN
Start: 2024-03-27 — End: 2024-03-27
  Administered 2024-03-27: 10 mg via INTRAVENOUS

## 2024-03-27 MED ORDER — FENTANYL CITRATE (PF) 100 MCG/2ML IJ SOLN
25.0000 ug | INTRAMUSCULAR | Status: DC | PRN
  Administered 2024-03-27: 25 ug via INTRAVENOUS

## 2024-03-27 MED ORDER — CHLORHEXIDINE GLUCONATE 4 % EX SOLN: 60.0000 mL | Freq: Once | CUTANEOUS | Status: DC

## 2024-03-27 MED ORDER — PROTAMINE SULFATE 10 MG/ML IV SOLN
INTRAVENOUS | Status: DC | PRN
  Administered 2024-03-27: 20 mg via INTRAVENOUS

## 2024-03-27 MED ORDER — CHLORHEXIDINE GLUCONATE 0.12 % MT SOLN
15.0000 mL | Freq: Once | OROMUCOSAL | Status: AC
  Administered 2024-03-27: 15 mL via OROMUCOSAL
  Filled 2024-03-27: qty 15

## 2024-03-27 MED ORDER — HEPARIN 6000 UNIT IRRIGATION SOLUTION
Status: DC | PRN
  Administered 2024-03-27: 1

## 2024-03-27 MED ORDER — LACTATED RINGERS IV SOLN: INTRAVENOUS | Status: DC

## 2024-03-27 MED ORDER — POVIDONE-IODINE 10 % EX SOLN
CUTANEOUS | Status: DC | PRN
  Administered 2024-03-27: 1 via TOPICAL

## 2024-03-27 MED ORDER — DEXAMETHASONE SOD PHOSPHATE PF 10 MG/ML IJ SOLN
INTRAMUSCULAR | Status: DC | PRN
  Administered 2024-03-27: 5 mg via INTRAVENOUS

## 2024-03-27 MED ORDER — HYDROCODONE-ACETAMINOPHEN 5-325 MG PO TABS: 1.0000 | ORAL_TABLET | Freq: Four times a day (QID) | ORAL | 0 refills | Status: AC | PRN

## 2024-03-27 MED ORDER — LIDOCAINE 2% (20 MG/ML) 5 ML SYRINGE
INTRAMUSCULAR | Status: DC | PRN
  Administered 2024-03-27: 100 mg via INTRAVENOUS

## 2024-03-27 MED ORDER — CEFAZOLIN SODIUM-DEXTROSE 2-4 GM/100ML-% IV SOLN
2.0000 g | INTRAVENOUS | Status: AC
  Administered 2024-03-27: 2 g via INTRAVENOUS
  Filled 2024-03-27: qty 100

## 2024-03-27 MED ORDER — HEPARIN 6000 UNIT IRRIGATION SOLUTION
Status: AC
  Filled 2024-03-27: qty 500

## 2024-03-27 MED ORDER — ACETAMINOPHEN 10 MG/ML IV SOLN
INTRAVENOUS | Status: AC
  Filled 2024-03-27: qty 100

## 2024-03-27 MED ORDER — ACETAMINOPHEN 10 MG/ML IV SOLN
1000.0000 mg | Freq: Once | INTRAVENOUS | Status: DC | PRN
Start: 2024-03-27 — End: 2024-03-27
  Administered 2024-03-27: 1000 mg via INTRAVENOUS

## 2024-03-27 MED ORDER — ONDANSETRON HCL 4 MG/2ML IJ SOLN: 4.0000 mg | Freq: Once | INTRAMUSCULAR | Status: DC | PRN

## 2024-03-27 MED ORDER — PROPOFOL 10 MG/ML IV BOLUS
INTRAVENOUS | Status: DC | PRN
  Administered 2024-03-27: 140 mg via INTRAVENOUS

## 2024-03-27 MED ORDER — FENTANYL CITRATE (PF) 100 MCG/2ML IJ SOLN
INTRAMUSCULAR | Status: AC
  Filled 2024-03-27: qty 2

## 2024-03-27 MED ORDER — SODIUM CHLORIDE 0.9 % IV SOLN: INTRAVENOUS | Status: DC

## 2024-03-27 MED ORDER — INSULIN ASPART 100 UNIT/ML IJ SOLN: 0.0000 [IU] | INTRAMUSCULAR | Status: DC | PRN

## 2024-03-27 MED ORDER — FENTANYL CITRATE (PF) 250 MCG/5ML IJ SOLN
INTRAMUSCULAR | Status: DC | PRN
  Administered 2024-03-27 (×2): 25 ug via INTRAVENOUS
  Administered 2024-03-27: 50 ug via INTRAVENOUS

## 2024-03-27 MED ORDER — ORAL CARE MOUTH RINSE: 15.0000 mL | Freq: Once | OROMUCOSAL | Status: AC

## 2024-03-27 MED ORDER — AMISULPRIDE (ANTIEMETIC) 5 MG/2ML IV SOLN: 10.0000 mg | Freq: Once | INTRAVENOUS | Status: DC | PRN

## 2024-03-27 MED ORDER — 0.9 % SODIUM CHLORIDE (POUR BTL) OPTIME
TOPICAL | Status: DC | PRN
  Administered 2024-03-27: 1000 mL

## 2024-03-27 SURGICAL SUPPLY — 28 items
ARMBAND PINK RESTRICT EXTREMIT (MISCELLANEOUS) ×1 IMPLANT
BAG COUNTER SPONGE SURGICOUNT (BAG) ×1 IMPLANT
BLADE CLIPPER SURG (BLADE) ×1 IMPLANT
BNDG ELASTIC 4X5.8 VLCR STR LF (GAUZE/BANDAGES/DRESSINGS) ×1 IMPLANT
CANISTER SUCTION 3000ML PPV (SUCTIONS) ×1 IMPLANT
CLIP TI MEDIUM 6 (CLIP) ×1 IMPLANT
CLIP TI WIDE RED SMALL 6 (CLIP) ×1 IMPLANT
COVER PROBE W GEL 5X96 (DRAPES) ×1 IMPLANT
DERMABOND ADVANCED .7 DNX12 (GAUZE/BANDAGES/DRESSINGS) ×1 IMPLANT
ELECTRODE REM PT RTRN 9FT ADLT (ELECTROSURGICAL) ×1 IMPLANT
GLOVE BIOGEL PI IND STRL 8 (GLOVE) ×1 IMPLANT
GOWN STRL REUS W/ TWL LRG LVL3 (GOWN DISPOSABLE) ×2 IMPLANT
GOWN STRL REUS W/TWL 2XL LVL3 (GOWN DISPOSABLE) ×2 IMPLANT
GRAFT GORETEX STRT 4-7X45 (Vascular Products) IMPLANT
KIT BASIN OR (CUSTOM PROCEDURE TRAY) ×1 IMPLANT
KIT TURNOVER KIT B (KITS) ×1 IMPLANT
PACK CV ACCESS (CUSTOM PROCEDURE TRAY) ×1 IMPLANT
PAD ARMBOARD POSITIONER FOAM (MISCELLANEOUS) ×2 IMPLANT
SLING ARM FOAM STRAP LRG (SOFTGOODS) IMPLANT
SOLN 0.9% NACL POUR BTL 1000ML (IV SOLUTION) ×1 IMPLANT
SOLN STERILE WATER BTL 1000 ML (IV SOLUTION) ×1 IMPLANT
SUT MNCRL AB 4-0 PS2 18 (SUTURE) ×1 IMPLANT
SUT PROLENE 6 0 BV (SUTURE) ×1 IMPLANT
SUT PROLENE 7 0 BV 1 (SUTURE) IMPLANT
SUT SILK 2 0 PERMA HAND 18 BK (SUTURE) IMPLANT
SUT VIC AB 3-0 SH 27X BRD (SUTURE) ×2 IMPLANT
TOWEL GREEN STERILE (TOWEL DISPOSABLE) ×1 IMPLANT
UNDERPAD 30X36 HEAVY ABSORB (UNDERPADS AND DIAPERS) ×1 IMPLANT

## 2024-03-27 NOTE — Discharge Instructions (Addendum)
 [facility]  Discharge Instructions  AV Fistula or Graft Surgery for Dialysis Access  Please refer to the following instructions for your post-procedure care. Your surgeon or physician assistant will discuss any changes with you.  Activity  You may drive the day following your surgery, if you are comfortable and no longer taking prescription pain medication. Resume full activity as the soreness in your incision resolves.  Bathing/Showering  You may shower after you go home. Keep your incision dry for 48 hours. Do not soak in a bathtub, hot tub, or swim until the incision heals completely. You may not shower if you have a hemodialysis catheter.  Incision Care  Clean your incision with mild soap and water after 48 hours. Pat the area dry with a clean towel. You do not need a bandage unless otherwise instructed. Do not apply any ointments or creams to your incision. You may have skin glue on your incision. Do not peel it off. It will come off on its own in about one week. Your arm may swell a bit after surgery. To reduce swelling use pillows to elevate your arm so it is above your heart. Your doctor will tell you if you need to lightly wrap your arm with an ACE bandage.  Diet  Resume your normal diet. There are not special food restrictions following this procedure. In order to heal from your surgery, it is CRITICAL to get adequate nutrition. Your body requires vitamins, minerals, and protein. Vegetables are the best source of vitamins and minerals. Vegetables also provide the perfect balance of protein. Processed food has little nutritional value, so try to avoid this.  Medications  Resume taking all of your medications. If your incision is causing pain, you may take over-the counter pain relievers such as acetaminophen  (Tylenol ). If you were prescribed a stronger pain medication, please be aware these medications can cause nausea and constipation. Prevent  nausea by taking the medication with a snack or meal. Avoid constipation by drinking plenty of fluids and eating foods with high amount of fiber, such as fruits, vegetables, and grains.  Do not take Tylenol  if you are taking prescription pain medications.  Follow up Your surgeon may want to see you in the office following your access surgery. If so, this will be arranged at the time of your surgery.  Please call us  immediately for any of the following conditions:  Increased pain, redness, drainage (pus) from your incision site Fever of 101 degrees or higher Severe or worsening pain at your incision site Hand pain or numbness.  Reduce your risk of vascular disease:  Stop smoking. If you would like help, call [facility] at 1-800-QUIT-NOW (6035305102) or [facility] at [phone]  Manage your cholesterol Maintain a desired weight Control your diabetes Keep your blood pressure down  Dialysis  It will take several weeks to several months for your new dialysis access to be ready for use. Your surgeon will determine when it is okay to use it. Your nephrologist will continue to direct your dialysis. You can continue to use your Permcath until your new access is ready for use.   03/27/2024 Michelle Abbott [id number] October 29, 1945  Surgeon(s): Michelle Fonda BRAVO, MD  Procedure(s): INSERTION, GRAFT, ARTERIOVENOUS, UPPER EXTREMITY   May stick graft immediately   May stick graft on designated area only:   x Do not stick AVG for 4 weeks    If you have any questions, please call the office at [phone].

## 2024-03-27 NOTE — Op Note (Addendum)
 NAME: Michelle Abbott    MRN: [id number] DOB: Jul 28, 1945    DATE OF OPERATION: 03/27/2024  PREOP DIAGNOSIS:    Chronic kidney disease stage V in need of long-term HD access  POSTOP DIAGNOSIS:    Same  PROCEDURE:    Left arm brachial artery to axillary vein AV graft using 4-7 mm tapered PTFE Redo exposure of the left brachial artery greater than 30 days Ligation previous brachiobasilic fistula  SURGEON: Fonda FORBES Rim  ASSIST: Donnice Sender, PA  ANESTHESIA: General   EBL: 20ml  INDICATIONS:    Michelle Abbott is a 78 y.o. female with CKD 5 in need of long-term HD access.  Previous history includes left arm brachiobasilic fistula which is thrombosed.  Over the last year, she has lost over 100 pounds, and presents today with no superficial veins appreciated and plans for left arm AV graft.  After discussing risks and benefits, Harold elected to proceed.  FINDINGS:   4 mm brachial artery sewn end to side 7 mm axillary vein sewn into end  TECHNIQUE:   After informed consent was obtained, the patient was brought to the operating room, placed supine on the operating room table.  General anesthesia was used.  The patient was prepped and draped in normal sterile fashion.  Surgical time-out was taken. Pre-op  antibiotics were given.  Procedure began with using ultrasound and sent the brachial artery and axillary vein in the left arm.  Both proved of sufficient size to continue with AV graft.  A longitudinal incision was made at the distal aspect of the humerus. The brachial artery was identified, gently dissected, and encircled with silastic loops.  The prior brachiobasilic fistula was known to be chronically occluded, but was ligated and transected using 2-0 silk suture.  A second incision was then made over the axillary vein and similarly dissected and encircled. The tunneling device was then passed between these incisions. A 4 X 7 taper PTFE graft was passed  through the tunnel, taking care to avoid kinking or twists. The graft was irrigated with heparinized saline solution.   Proximal and distal arterial control was then obtained and a longitudinal arteriotomy was made on the brachial artery. The artery was irrigated with heparinized saline solution. An end to side artery anastomosis was then performed from the 4mm graft to the brachial vein in a running fashion using running 6-0 prolene suture. When the clamps were removed from the artery, the graft demonstrated excellent inflow. It was flushed with heparinized saline prior to being clamped. There was an excellent radial signal at the wrist.  Attention was then directed to the axillary vein anastomosis. Proximal and distal control were obtained and a longitudinal venotomy was made. The vein was then irrigated with heparinized saline solution. The end of the PTFE graft was then cut in a beveled fashion at the appropriate length. An end to side vein anastomosis was then performed, using a running, 6-0 prolene suture. Prior to establishing flow, all vessels were back bled and flushed to ensure there was no clot.    There was a strong distal signal in the radial artery and a thrill in the vein centrally. Hemostasis was found to be satisfactory, and all wounds were irrigated with saline solution and closed in layers with vicryl and Monocryl at the skin. A dry sterile dressing was applied. Anesthetic care was terminated. The patient was transported to the recovery room in stable condition.   Given the complexity of the case,  the assistant was necessary in order to expedient the procedure and safely perform the technical aspects of the operation.  The assistant provided traction and countertraction to assist with exposure of the artery and vein.  They also assisted with suture ligation of multiple venous branches. They also assisted with tunneling of the graft.  They played a critical role for both anastomoses..  These skills, especially following the Prolene suture for the anastomosis, could not have been adequately performed by a scrub tech assistant.   Fonda FORBES Rim, MD [facility] DATE OF DICTATION:   03/27/2024

## 2024-03-27 NOTE — Anesthesia Preprocedure Evaluation (Addendum)
 Anesthesia Evaluation  Patient identified by MRN, date of birth, ID band Patient awake    Reviewed: Allergy & Precautions, NPO status , Patient's Chart, lab work & pertinent test results  Airway Mallampati: II  TM Distance: >3 FB Neck ROM: Full    Dental  (+) Lower Dentures, Upper Dentures   Pulmonary former smoker   Pulmonary exam normal        Cardiovascular hypertension, Pt. on medications and Pt. on home beta blockers Normal cardiovascular exam     Neuro/Psych  Neuromuscular disease    GI/Hepatic negative GI ROS, Neg liver ROS,,,  Endo/Other  diabetes  Patient on GLP-1 Agonist  Renal/GU ESRFRenal disease     Musculoskeletal   Abdominal   Peds  Hematology  (+) Blood dyscrasia, anemia   Anesthesia Other Findings ESRD  Reproductive/Obstetrics                              Anesthesia Physical Anesthesia Plan  ASA: 3  Anesthesia Plan: General   Post-op Pain Management:    Induction: Intravenous  PONV Risk Score and Plan: 3 and Ondansetron , Dexamethasone  and Treatment may vary due to age or medical condition  Airway Management Planned: LMA  Additional Equipment:   Intra-op Plan:   Post-operative Plan: Extubation in OR  Informed Consent: I have reviewed the patients History and Physical, chart, labs and discussed the procedure including the risks, benefits and alternatives for the proposed anesthesia with the patient or authorized representative who has indicated his/her understanding and acceptance.     Dental advisory given  Plan Discussed with: CRNA  Anesthesia Plan Comments:         Anesthesia Quick Evaluation

## 2024-03-27 NOTE — H&P (Signed)
 Office Note   Patient seen and examined in preop holding.  No complaints. No changes to medication history or physical exam since last seen in clinic. After discussing the risks and benefits of left arm AV graft creation, Michelle Abbott elected to proceed.   Michelle FORBES Rim MD   CC:  CKD5 Requesting Provider:  No ref. provider found  HPI: Michelle Abbott is a Right handed 78 y.o. (1945-12-14) female with kidney disease who presents at the request of No ref. provider found for permanent HD access. The patient has had prior access procedures -left arm brachiobasilic fistula creation and subsequent transposition 2024.  No previous tunneled lines.  Not on dialysis. Of note, the patient is lost to 100 pounds on Ozempic .  On exam today, Michelle Abbott was doing well.  She has yet to start dialysis.  She had an appointment with nephrology 2 days ago, and there are no plans to start dialysis currently. She continues to be active, spending most of her time at [employer], where she is a member.   Past Medical History:  Diagnosis Date   Acute kidney injury superimposed on chronic kidney disease 04/28/2020   Anemia    Arthritis    Chronic kidney disease    Stage 4   Diabetes mellitus without complication (HCC)    type 2   GERD (gastroesophageal reflux disease)    Gout 2017   Hyperlipidemia    Hypertension    Hypertensive nephropathy 05/09/2021   Nephrotic range proteinuria 05/09/2021   Postoperative pain 04/29/2020   Rectal bleeding, History of 12/28/2019   Sciatica    Secondary hyperparathyroidism of renal origin 05/09/2021   SI (sacroiliac) joint inflammation 03/12/2019   UTI (urinary tract infection) 02/02/2019   Vitamin D  deficiency 2014    Past Surgical History:  Procedure Laterality Date   AV FISTULA PLACEMENT Left 12/08/2022   Procedure: CREATION OF LEFT ARM  BRACHIO BASILIC FISTULA   (FIRST STAGE);  Surgeon: Abbott Michelle FORBES, MD;  Location: [facility]  OR;  Service: Vascular;  Laterality: Left;   BASCILIC VEIN TRANSPOSITION Left 02/02/2023   Procedure: LEFT ARM SECOND STAGE BASILIC VEIN TRANSPOSITION;  Surgeon: Abbott Michelle FORBES, MD;  Location: [facility] OR;  Service: Vascular;  Laterality: Left;   BREAST SURGERY Right 1961   Benign lump removed   CATARACT EXTRACTION, BILATERAL Bilateral 2017   CESAREAN SECTION  1985   CESAREAN SECTION  1990   CHOLECYSTECTOMY  1999   COLONOSCOPY     DILATION AND CURETTAGE OF UTERUS  2017   KNEE ARTHROSCOPY Right    VAGINAL SEPTOPLASTY  1966    Social History   Socioeconomic History   Marital status: Widowed    Spouse name: Not on file   Number of children: 2   Years of education: 43   Highest education level: Some college, no degree  Occupational History   Occupation: secondary school teacher     Comment: Developmentally disabled persons   Occupation: Retired  Tobacco Use   Smoking status: Former    Current packs/day: 0.00    Average packs/day: 1.5 packs/day for 30.0 years (45.0 ttl pk-yrs)    Types: Cigarettes    Start date: 05/19/1967    Quit date: 05/18/1997    Years since quitting: 26.8    Passive exposure: Past   Smokeless tobacco: Never   Tobacco comments:    1.5 - 2 ppd for 30 years:   no plans to re-start  Vaping Use   Vaping status: Never  Used  Substance and Sexual Activity   Alcohol use: No   Drug use: No   Sexual activity: Not Currently  Other Topics Concern   Not on file  Social History Narrative   Lives by herself in one level apt. Daughter lives in same complex. Has grab bars in bathroom. Smoke alarms, one rug under coffee table and at door.   No pets.   Recently moved from [place] in last two years.    Daughter helps with transportation.      Likes to eat meat, some vegetables, fruit. Drinks to drink sweet tea, some coffee. Get $15 in food stamps but runs out of food before end of month.   Enjoys shopping and fellowship. Writes poetry and plays. Word puzzles.   Social Drivers of Manufacturing Engineer Strain: Low Risk  (09/23/2023)   Overall Financial Resource Strain (CARDIA)    Difficulty of Paying Living Expenses: Not hard at all  Food Insecurity: No Food Insecurity (09/23/2023)   Hunger Vital Sign    Worried About Running Out of Food in the Last Year: Never true    Ran Out of Food in the Last Year: Never true  Transportation Needs: No Transportation Needs (09/23/2023)   PRAPARE - Administrator, Civil Service (Medical): No    Lack of Transportation (Non-Medical): No  Physical Activity: Insufficiently Active (09/23/2023)   Exercise Vital Sign    Days of Exercise per Week: 1 day    Minutes of Exercise per Session: 60 min  Stress: No Stress Concern Present (09/23/2023)   [employer] of Occupational Health - Occupational Stress Questionnaire    Feeling of Stress : Only a little  Social Connections: Moderately Integrated (09/23/2023)   Social Connection and Isolation Panel    Frequency of Communication with Friends and Family: More than three times a week    Frequency of Social Gatherings with Friends and Family: More than three times a week    Attends Religious Services: More than 4 times per year    Active Member of [employer] or Organizations: Yes    Attends Banker Meetings: More than 4 times per year    Marital Status: Widowed  Intimate Partner Violence: Not At Risk (09/23/2023)   Humiliation, Afraid, Rape, and Kick questionnaire    Fear of Current or Ex-Partner: No    Emotionally Abused: No    Physically Abused: No    Sexually Abused: No   Family History  Problem Relation Age of Onset   Cancer Mother        ovarian and pancreatic   Diabetes Mother    Stroke Mother    COPD Father    Arthritis Father    Cancer Brother        Lung   COPD Brother    Breast cancer Neg Hx    BRCA 1/2 Neg Hx     Current Facility-Administered Medications  Medication Dose Route Frequency Provider Last Rate Last Admin   0.9 %  sodium chloride  infusion    Intravenous Continuous Doyle Kunath E, MD 10 mL/hr at 03/27/24 1118 New Bag at 03/27/24 1118   ceFAZolin  (ANCEF ) IVPB 2g/100 mL premix  2 g Intravenous 30 min Pre-Op  Soleia Badolato E, MD       chlorhexidine  (HIBICLENS ) 4 % liquid 4 Application  60 mL Topical Once Tosca Pletz E, MD       And   [START ON 03/28/2024] chlorhexidine  (HIBICLENS ) 4 % liquid 4  Application  60 mL Topical Once Swade Shonka E, MD       insulin  aspart (novoLOG ) injection 0-7 Units  0-7 Units Subcutaneous Q2H PRN Ellender, Bernardino SQUIBB, MD       lactated ringers  infusion   Intravenous Continuous Ellender, Bernardino SQUIBB, MD        Allergies  Allergen Reactions   Ciprofloxacin  Hcl Other (See Comments)    Burning at IV site   Metformin And Related Other (See Comments)    CKD    Ace Inhibitors Cough    Lisinopril   Amlodipine  Swelling    Lower leg swelling.    Baclofen  Nausea Only and Other (See Comments)    Disoriented, dizziness, weakness, fatigue.    Anesthesia S-I-40a [Propofol ] Other (See Comments)    Does not come out of anesthesia well   Feraheme  [Ferumoxytol ] Other (See Comments)    Chest pain    Lisinopril Palpitations     REVIEW OF SYSTEMS:  [X]  denotes positive finding, [ ]  denotes negative finding Cardiac  Comments:  Chest pain or chest pressure:    Shortness of breath upon exertion:    Short of breath when lying flat:    Irregular heart rhythm:        Vascular    Pain in calf, thigh, or hip brought on by ambulation:    Pain in feet at night that wakes you up from your sleep:     Blood clot in your veins:    Leg swelling:         Pulmonary    Oxygen at home:    Productive cough:     Wheezing:         Neurologic    Sudden weakness in arms or legs:     Sudden numbness in arms or legs:     Sudden onset of difficulty speaking or slurred speech:    Temporary loss of vision in one eye:     Problems with dizziness:         Gastrointestinal    Blood in stool:     Vomited blood:          Genitourinary    Burning when urinating:     Blood in urine:        Psychiatric    Major depression:         Hematologic    Bleeding problems:    Problems with blood clotting too easily:        Skin    Rashes or ulcers:        Constitutional    Fever or chills:      PHYSICAL EXAMINATION:  Vitals:   03/24/24 0946 03/27/24 1039  BP:  (!) 199/70  Pulse:  (!) 54  Resp:  18  Temp:  97.9 F (36.6 C)  TempSrc:  Oral  SpO2:  94%  Weight: 75.8 kg 75.8 kg  Height: 5' 3 (1.6 m) 5' 3 (1.6 m)    General:  WDWN in NAD; vital signs documented above Gait: Not observed HENT: WNL, normocephalic Pulmonary: normal non-labored breathing , without Rales, rhonchi,  wheezing Cardiac: regular HR Abdomen: soft, NT, no masses Skin: without rashes Vascular Exam/Pulses:  Right Left  Radial 2+ (normal) 2+ (normal)                       Extremities: without ischemic changes, without Gangrene , without cellulitis; without open wounds;  Musculoskeletal: no muscle wasting or atrophy  Neurologic: A&O X 3;  No focal weakness or paresthesias are detected Psychiatric:  The pt has Normal affect.   Non-Invasive Vascular Imaging:   See studies    ASSESSMENT/PLAN:  Sanaia Jasso is a 78 y.o. female who presents with chronic kidney disease stage 5  Based on vein mapping and examination, Mahaila will require AV graft when she is closer to dialysis.  At this time, there are no plans to start dialysis in the coming months.. I had an extensive discussion with this patient in regards to the nature of access surgery, including risk, benefits, and alternatives.   The patient is aware that the risks of access surgery include but are not limited to: bleeding, infection, steal syndrome, nerve damage, ischemic monomelic neuropathy, failure of access to mature, complications related to venous hypertension, and possible need for additional access procedures in the future. We discussed that  I am available, and happy to perform her left upper extremity AV graft whenever she is closer to needing permanent access.  Michelle FORBES Rim, MD [facility] [phone]

## 2024-03-27 NOTE — Anesthesia Procedure Notes (Signed)
 Procedure Name: LMA Insertion Date/Time: 03/27/2024 1:28 PM  Performed by: Lockie Flesher, CRNAPre-anesthesia Checklist: Patient identified, Emergency Drugs available, Suction available and Patient being monitored Patient Re-evaluated:Patient Re-evaluated prior to induction Oxygen Delivery Method: Circle System Utilized Preoxygenation: Pre-oxygenation with 100% oxygen Induction Type: IV induction Ventilation: Mask ventilation without difficulty LMA: LMA inserted LMA Size: 4.0 Number of attempts: 1 Airway Equipment and Method: Bite block Placement Confirmation: positive ETCO2 Tube secured with: Tape Dental Injury: Teeth and Oropharynx as per pre-operative assessment

## 2024-03-27 NOTE — Transfer of Care (Signed)
 Immediate Anesthesia Transfer of Care Note  Patient: Michelle Abbott  Procedure(s) Performed: INSERTION, GRAFT, ARTERIOVENOUS, UPPER EXTREMITY (Left)  Patient Location: PACU  Anesthesia Type:General  Level of Consciousness: awake, alert , and oriented  Airway & Oxygen Therapy: Patient Spontanous Breathing  Post-op Assessment: Report given to RN and Post -op Vital signs reviewed and stable  Post vital signs: Reviewed and stable  Last Vitals:  Vitals Value Taken Time  BP 178/81 03/27/24 15:01  Temp    Pulse 79 03/27/24 15:03  Resp 16 03/27/24 15:03  SpO2 98 % 03/27/24 15:03  Vitals shown include unfiled device data.  Last Pain:  Vitals:   03/27/24 1057  TempSrc:   PainSc: 0-No pain      Patients Stated Pain Goal: 0 (03/27/24 1057)  Complications: No notable events documented.

## 2024-03-27 NOTE — Progress Notes (Signed)
 Dr. Patrisha made aware that the patient did not take Carvedilol  25 mg this morning. Ok to hold per Dr. Patrisha.

## 2024-03-28 ENCOUNTER — Other Ambulatory Visit: Payer: Self-pay | Admitting: Family Medicine

## 2024-03-28 ENCOUNTER — Encounter (HOSPITAL_COMMUNITY): Payer: Self-pay | Admitting: Vascular Surgery

## 2024-03-28 DIAGNOSIS — I152 Hypertension secondary to endocrine disorders: Secondary | ICD-10-CM

## 2024-03-28 NOTE — Anesthesia Postprocedure Evaluation (Signed)
 Anesthesia Post Note  Patient: Michelle Abbott  Procedure(s) Performed: INSERTION, GRAFT, ARTERIOVENOUS, UPPER EXTREMITY (Left)     Patient location during evaluation: PACU Anesthesia Type: General Level of consciousness: awake Pain management: pain level controlled Vital Signs Assessment: post-procedure vital signs reviewed and stable Respiratory status: spontaneous breathing, nonlabored ventilation and respiratory function stable Cardiovascular status: blood pressure returned to baseline and stable Postop Assessment: no apparent nausea or vomiting Anesthetic complications: no   No notable events documented.  Last Vitals:  Vitals:   03/27/24 1530 03/27/24 1545  BP: (!) 188/77 (!) 174/71  Pulse: 73 79  Resp: 13 15  Temp:  36.8 C  SpO2: 97% 94%    Last Pain:  Vitals:   03/27/24 1530  TempSrc:   PainSc: Asleep                 Emmalene Kattner P Tashi Band

## 2024-04-11 ENCOUNTER — Inpatient Hospital Stay (HOSPITAL_COMMUNITY): Admission: RE | Admit: 2024-04-11 | Source: Ambulatory Visit

## 2024-04-20 ENCOUNTER — Other Ambulatory Visit: Payer: Self-pay | Admitting: Family Medicine

## 2024-04-20 DIAGNOSIS — I152 Hypertension secondary to endocrine disorders: Secondary | ICD-10-CM

## 2024-04-27 ENCOUNTER — Encounter: Payer: Self-pay | Admitting: Physician Assistant

## 2024-04-27 ENCOUNTER — Other Ambulatory Visit: Payer: Self-pay | Admitting: Family Medicine

## 2024-04-27 ENCOUNTER — Encounter (HOSPITAL_COMMUNITY)

## 2024-04-27 ENCOUNTER — Ambulatory Visit: Attending: Surgery

## 2024-04-27 VITALS — BP 168/72 | HR 71 | Temp 97.9°F | Ht 63.0 in | Wt 173.3 lb

## 2024-04-27 DIAGNOSIS — I152 Hypertension secondary to endocrine disorders: Secondary | ICD-10-CM

## 2024-04-27 DIAGNOSIS — N185 Chronic kidney disease, stage 5: Secondary | ICD-10-CM

## 2024-04-27 NOTE — Progress Notes (Signed)
 POST OPERATIVE OFFICE NOTE    CC:  F/u for surgery  HPI:  This is a 78 y.o. female who is s/p LUA AVG on 03/27/2024 by Dr. Lanis.  She previously had left BVT that occluded.    Pt is here with her daughter.  She states she does have some coolness in her fingers on the left hand at times.  She does not have pain in the left hand.  She does not have any clumsiness in the left hand.  She does have swelling in the left arm that improves with elevation.    The pt is not yet on dialysis.  She states she may be starting on HD in January.     Allergies[1]  Current Outpatient Medications  Medication Sig Dispense Refill   acetaminophen  (TYLENOL  8 HOUR) 650 MG CR tablet Take 1 tablet (650 mg total) by mouth every 8 (eight) hours as needed for pain. 30 tablet 1   allopurinol  (ZYLOPRIM ) 100 MG tablet TAKE ONE TABLET (100MG  TOTAL) BY MOUTH TWICE A WEEK ON WEDNESDAY AND SUNDAY 12 tablet 0   atorvastatin  (LIPITOR) 40 MG tablet Take 1 tablet (40 mg total) by mouth daily. 90 tablet 3   B-D ULTRAFINE III SHORT PEN 31G X 8 MM MISC Use to give insulin  twice a day and victoza  once a day. Dx code = E11.9 300 each 3   Blood Glucose Monitoring Suppl (ACCU-CHEK GUIDE) w/Device KIT Use to check blood sugar 3 times per day 1 kit 0   carvedilol  (COREG ) 25 MG tablet Take 25 mg by mouth in the morning.     Cholecalciferol  (VITAMIN D ) 50 MCG (2000 UT) CAPS Take 2,000 Units by mouth daily.     colchicine  0.6 MG tablet Take 0.6 mg by mouth daily as needed.     Cyanocobalamin  (VITAMIN B-12) 2500 MCG SUBL Place 2,500 mcg under the tongue daily.     furosemide  (LASIX ) 40 MG tablet Take 40 mg by mouth in the morning.     glucose blood (ONETOUCH ULTRA TEST) test strip Use to check blood sugar BID. E11.9 100 each 12   HYDROcodone -acetaminophen  (NORCO/VICODIN) 5-325 MG tablet Take 1 tablet by mouth every 6 (six) hours as needed for moderate pain (pain score 4-6). 20 tablet 0   Lancets (ONETOUCH ULTRASOFT) lancets Use to  check blood sugar BID. E11.9 100 each 12   losartan  (COZAAR ) 100 MG tablet TAKE 1 TABLET BY MOUTH EVERY DAY 30 tablet 0   Multiple Vitamin (MULTIVITAMIN WITH MINERALS) TABS tablet Take 1 tablet by mouth 2 (two) times a week.     oxybutynin  (DITROPAN  XL) 15 MG 24 hr tablet Take 15 mg by mouth every morning.     polyethylene glycol powder (MIRALAX ) 17 GM/SCOOP powder Take 17 g by mouth daily as needed (constipation). 255 g 0   Semaglutide ,0.25 or 0.5MG /DOS, 2 MG/1.5ML SOPN Inject 0.25 mg into the skin once a week. 0.25 mg once weekly for 4 weeks then increase to 0.5 mg weekly for at least 4 weeks,max 1 mg (Patient taking differently: Inject 0.25 mg into the skin once a week.) 3 mL 0   sodium bicarbonate 650 MG tablet Take 650 mg by mouth 3 (three) times daily.     spironolactone  (ALDACTONE ) 25 MG tablet TAKE ONE TABLET (25 MG TOTAL) BY MOUTH DAILY PATIENT NEEDS APPOINTMENT BEFORE NEXT REFILL 30 tablet 0   amoxicillin -clavulanate (AUGMENTIN ) 500-125 MG tablet Take 1 tablet by mouth 2 (two) times daily. (Patient not taking: Reported  on 04/27/2024)     No current facility-administered medications for this visit.     ROS:  See HPI  Physical Exam:  Today's Vitals   04/27/24 1123  BP: (!) 168/72  Pulse: 71  Temp: 97.9 F (36.6 C)  TempSrc: Temporal  Weight: 173 lb 4.8 oz (78.6 kg)  Height: 5' 3 (1.6 m)  PainSc: 8    Body mass index is 30.7 kg/m.   Incision:  both incisions are clean and dry.   Extremities:   There is a palpable left radial  pulse.   Motor and sensory are in tact.   There is a thrill present.  Her graft is not pulsatile. Access is  easily palpable    Assessment/Plan:  This is a 78 y.o. female who is s/p: LUA AVG 03/27/2024 by Dr. Lanis  -the pt does have some coolness to the left hand but no clumsiness or pain.  Discussed wearing a glove on the left hand.  Discussed if she develops pain or clumsiness, we may need to ligate the graft.   -she does have some left  arm swelling down into the hand.  This improves with elevation.  She has an excellent thrill in the graft.  Discussed possibility of having outflow stenosis but she is not yet on dialysis.  I wrapped her forearm from hand to elbow with gentle compression and encouraged her to elevate her arm.   -she has appt in January about dialysis.  Discussed would like to hold off on fistulogram since she is not yet on HD and we would have to use contrast  -pt's access can be used now if needed. -discussed with pt that access does not last forever  -the pt will follow up as needed.  She knows to call if the swelling her left arm does not improve or gets worse.     Michelle Abbott, [facility] [phone]  Clinic MD:  Lanis    [1]  Allergies Allergen Reactions   Ciprofloxacin  Hcl Other (See Comments)    Burning at IV site   Metformin And Related Other (See Comments)    CKD    Ace Inhibitors Cough    Lisinopril   Amlodipine  Swelling    Lower leg swelling.    Baclofen  Nausea Only and Other (See Comments)    Disoriented, dizziness, weakness, fatigue.    Anesthesia S-I-40a [Propofol ] Other (See Comments)    Does not come out of anesthesia well   Feraheme  [Ferumoxytol ] Other (See Comments)    Chest pain    Lisinopril Palpitations

## 2024-05-02 ENCOUNTER — Ambulatory Visit (HOSPITAL_COMMUNITY)
Admission: RE | Admit: 2024-05-02 | Discharge: 2024-05-02 | Disposition: A | Discharge status: Home / Self Care | Source: Ambulatory Visit | Attending: Nephrology | Admitting: Nephrology

## 2024-05-02 VITALS — BP 202/65 | HR 74 | Temp 96.6°F

## 2024-05-02 DIAGNOSIS — N185 Chronic kidney disease, stage 5: Secondary | ICD-10-CM | POA: Diagnosis present

## 2024-05-02 DIAGNOSIS — D631 Anemia in chronic kidney disease: Secondary | ICD-10-CM | POA: Insufficient documentation

## 2024-05-02 MED ORDER — CLONIDINE HCL 0.1 MG PO TABS
0.1000 mg | ORAL_TABLET | ORAL | Status: DC | PRN
  Administered 2024-05-02: 13:00:00 0.1 mg via ORAL

## 2024-05-02 MED ORDER — CLONIDINE HCL 0.1 MG PO TABS
ORAL_TABLET | ORAL | Status: AC
  Filled 2024-05-02: qty 1

## 2024-05-02 MED ORDER — EPOETIN ALFA-EPBX 40000 UNIT/ML IJ SOLN: 40000.0000 [IU] | Freq: Once | INTRAMUSCULAR | Status: DC

## 2024-05-02 NOTE — Progress Notes (Signed)
 Pt here for retacrit  injection.  Initial BP was 182/66.  Let pt sit for a few minutes and rechecked.  Was 189/66.  Pt states that she did not take her meds prior to her appt.  Clonidine  was given at 1315.  BP rechecked at 1335 showed 190/56, rechecked at 1348 and showed 201/69.  Pt decided to reschedule for Friday and was instructed to take her meds when she got home today and to take prior to appt on Friday

## 2024-05-05 ENCOUNTER — Inpatient Hospital Stay (HOSPITAL_COMMUNITY)
Admission: RE | Admit: 2024-05-05 | Discharge: 2024-05-05 | Disposition: A | Source: Ambulatory Visit | Attending: Nephrology

## 2024-05-05 VITALS — BP 176/60 | HR 71 | Temp 97.7°F | Resp 17

## 2024-05-05 DIAGNOSIS — N185 Chronic kidney disease, stage 5: Secondary | ICD-10-CM | POA: Diagnosis not present

## 2024-05-05 DIAGNOSIS — D631 Anemia in chronic kidney disease: Secondary | ICD-10-CM

## 2024-05-05 LAB — IRON AND TIBC
Iron: 26 ug/dL — ABNORMAL LOW (ref 28–170)
Saturation Ratios: 14 % (ref 10.4–31.8)
TIBC: 190 ug/dL — ABNORMAL LOW (ref 250–450)
UIBC: 164 ug/dL

## 2024-05-05 LAB — FERRITIN: Ferritin: 742 ng/mL — ABNORMAL HIGH (ref 11–307)

## 2024-05-05 MED ORDER — EPOETIN ALFA-EPBX 40000 UNIT/ML IJ SOLN
40000.0000 [IU] | Freq: Once | INTRAMUSCULAR | Status: AC
  Administered 2024-05-05: 40000 [IU] via SUBCUTANEOUS

## 2024-05-05 MED ORDER — EPOETIN ALFA-EPBX 40000 UNIT/ML IJ SOLN
INTRAMUSCULAR | Status: AC
  Filled 2024-05-05: qty 1

## 2024-05-05 NOTE — Progress Notes (Signed)
 Notified [name] at [facility] of HGB 7.8, patient denied any signs of blood. Per Bobbette we were okay to proceed and give retacrit  40000 u as ordered.

## 2024-05-08 LAB — POCT HEMOGLOBIN-HEMACUE: Hemoglobin: 7.8 g/dL — ABNORMAL LOW (ref 12.0–15.0)

## 2024-05-09 ENCOUNTER — Encounter (HOSPITAL_COMMUNITY)

## 2024-05-16 ENCOUNTER — Telehealth (HOSPITAL_COMMUNITY): Payer: Self-pay

## 2024-05-16 NOTE — Telephone Encounter (Signed)
 Auth Submission: NO AUTH NEEDED Site of care: Site of care: [facility] Payer: [employer] Medicare Medication & CPT/J Code(s) submitted: Retacrit  ([id number]) Diagnosis Code: N18.5/D63.1 Route of submission (phone, fax, portal): portal Phone # Fax # Auth type: Buy/Bill HB Units/visits requested: 40000 units q4weeks Reference number: [id number] Approval from: 05/18/24 to 05/17/25

## 2024-05-17 ENCOUNTER — Other Ambulatory Visit: Payer: Self-pay | Admitting: Family Medicine

## 2024-05-17 DIAGNOSIS — E1159 Type 2 diabetes mellitus with other circulatory complications: Secondary | ICD-10-CM

## 2024-05-25 ENCOUNTER — Encounter (HOSPITAL_COMMUNITY)

## 2024-05-30 ENCOUNTER — Inpatient Hospital Stay (HOSPITAL_COMMUNITY): Admission: RE | Admit: 2024-05-30 | Source: Ambulatory Visit

## 2024-06-05 NOTE — Progress Notes (Signed)
 The patient presented for a video visit on 02/16/24. Blood pressure screening wasn't conducted. During the appointment, the patient reported zero (SDOH).   A review of the patient's chart revealed that they do currently have a primary care provider (PCP) and the last office visit was 09/20/23. No future appointments were indicated.The pt does not smoke. At this time, no additional support from the [facility] team is necessary.

## 2024-06-27 ENCOUNTER — Encounter (HOSPITAL_COMMUNITY)

## 2024-09-28 ENCOUNTER — Encounter
